# Patient Record
Sex: Female | Born: 1935
Health system: Southern US, Community
[De-identification: ages and names within clinical notes are randomized; demographics above are authoritative.]

## PROBLEM LIST (undated history)

## (undated) DIAGNOSIS — E785 Hyperlipidemia, unspecified: Secondary | ICD-10-CM

## (undated) DIAGNOSIS — F329 Major depressive disorder, single episode, unspecified: Secondary | ICD-10-CM

## (undated) DIAGNOSIS — D649 Anemia, unspecified: Secondary | ICD-10-CM

## (undated) DIAGNOSIS — Z87442 Personal history of urinary calculi: Secondary | ICD-10-CM

## (undated) DIAGNOSIS — M479 Spondylosis, unspecified: Secondary | ICD-10-CM

## (undated) DIAGNOSIS — G4700 Insomnia, unspecified: Secondary | ICD-10-CM

## (undated) DIAGNOSIS — I442 Atrioventricular block, complete: Secondary | ICD-10-CM

## (undated) DIAGNOSIS — R7302 Impaired glucose tolerance (oral): Secondary | ICD-10-CM

## (undated) DIAGNOSIS — C50911 Malignant neoplasm of unspecified site of right female breast: Secondary | ICD-10-CM

## (undated) DIAGNOSIS — K219 Gastro-esophageal reflux disease without esophagitis: Secondary | ICD-10-CM

## (undated) DIAGNOSIS — E739 Lactose intolerance, unspecified: Secondary | ICD-10-CM

## (undated) DIAGNOSIS — Z86718 Personal history of other venous thrombosis and embolism: Secondary | ICD-10-CM

## (undated) DIAGNOSIS — J309 Allergic rhinitis, unspecified: Secondary | ICD-10-CM

## (undated) DIAGNOSIS — I10 Essential (primary) hypertension: Secondary | ICD-10-CM

## (undated) DIAGNOSIS — F411 Generalized anxiety disorder: Secondary | ICD-10-CM

## (undated) DIAGNOSIS — Z950 Presence of cardiac pacemaker: Secondary | ICD-10-CM

## (undated) DIAGNOSIS — Z1379 Encounter for other screening for genetic and chromosomal anomalies: Principal | ICD-10-CM

## (undated) DIAGNOSIS — J209 Acute bronchitis, unspecified: Secondary | ICD-10-CM

## (undated) DIAGNOSIS — E039 Hypothyroidism, unspecified: Secondary | ICD-10-CM

## (undated) DIAGNOSIS — R42 Dizziness and giddiness: Secondary | ICD-10-CM

## (undated) DIAGNOSIS — R32 Unspecified urinary incontinence: Secondary | ICD-10-CM

## (undated) HISTORY — DX: Hypothyroidism, unspecified: E03.9

## (undated) HISTORY — PX: PACEMAKER PLACEMENT: SHX43

## (undated) HISTORY — DX: Gastro-esophageal reflux disease without esophagitis: K21.9

## (undated) HISTORY — DX: Allergic rhinitis, unspecified: J30.9

## (undated) HISTORY — DX: Generalized anxiety disorder: F41.1

## (undated) HISTORY — DX: Essential (primary) hypertension: I10

## (undated) HISTORY — DX: Spondylosis, unspecified: M47.9

## (undated) HISTORY — DX: Acute bronchitis, unspecified: J20.9

## (undated) HISTORY — DX: Major depressive disorder, single episode, unspecified: F32.9

## (undated) HISTORY — DX: Dizziness and giddiness: R42

## (undated) HISTORY — DX: Personal history of urinary calculi: Z87.442

## (undated) HISTORY — DX: Atrioventricular block, complete: I44.2

## (undated) HISTORY — PX: ERCP W/ SPHICTEROTOMY: SHX1523

## (undated) HISTORY — DX: Encounter for other screening for genetic and chromosomal anomalies: Z13.79

## (undated) HISTORY — DX: Insomnia, unspecified: G47.00

## (undated) HISTORY — DX: Anemia, unspecified: D64.9

## (undated) HISTORY — PX: HEMORRHOID SURGERY: SHX153

## (undated) HISTORY — DX: Personal history of other venous thrombosis and embolism: Z86.718

## (undated) HISTORY — DX: Lactose intolerance, unspecified: E73.9

## (undated) HISTORY — PX: POLYPECTOMY: SHX149

## (undated) HISTORY — DX: Impaired glucose tolerance (oral): R73.02

## (undated) HISTORY — DX: Hyperlipidemia, unspecified: E78.5

---

## 1999-06-27 ENCOUNTER — Ambulatory Visit (HOSPITAL_COMMUNITY): Admission: RE | Admit: 1999-06-27 | Discharge: 1999-06-27 | Payer: Self-pay | Admitting: Family Medicine

## 1999-06-27 ENCOUNTER — Encounter: Payer: Self-pay | Admitting: Family Medicine

## 1999-07-28 ENCOUNTER — Other Ambulatory Visit: Admission: RE | Admit: 1999-07-28 | Discharge: 1999-07-28 | Payer: Self-pay | Admitting: Family Medicine

## 2000-01-05 ENCOUNTER — Encounter: Payer: Self-pay | Admitting: Obstetrics and Gynecology

## 2000-01-06 ENCOUNTER — Ambulatory Visit (HOSPITAL_COMMUNITY): Admission: RE | Admit: 2000-01-06 | Discharge: 2000-01-06 | Payer: Self-pay | Admitting: Obstetrics and Gynecology

## 2000-01-06 ENCOUNTER — Encounter (INDEPENDENT_AMBULATORY_CARE_PROVIDER_SITE_OTHER): Payer: Self-pay | Admitting: Specialist

## 2001-05-21 ENCOUNTER — Other Ambulatory Visit: Admission: RE | Admit: 2001-05-21 | Discharge: 2001-05-21 | Payer: Self-pay | Admitting: Urology

## 2001-05-28 ENCOUNTER — Encounter: Payer: Self-pay | Admitting: Urology

## 2001-05-29 ENCOUNTER — Encounter: Payer: Self-pay | Admitting: Urology

## 2001-05-29 ENCOUNTER — Ambulatory Visit (HOSPITAL_COMMUNITY): Admission: RE | Admit: 2001-05-29 | Discharge: 2001-05-29 | Payer: Self-pay | Admitting: Urology

## 2003-04-23 ENCOUNTER — Encounter: Payer: Self-pay | Admitting: Gastroenterology

## 2003-04-23 ENCOUNTER — Observation Stay (HOSPITAL_COMMUNITY): Admission: EM | Admit: 2003-04-23 | Discharge: 2003-04-24 | Payer: Self-pay | Admitting: Gastroenterology

## 2004-10-18 ENCOUNTER — Ambulatory Visit: Payer: Self-pay | Admitting: Internal Medicine

## 2004-10-28 ENCOUNTER — Ambulatory Visit: Payer: Self-pay

## 2004-11-30 ENCOUNTER — Ambulatory Visit: Payer: Self-pay | Admitting: Internal Medicine

## 2004-12-29 ENCOUNTER — Ambulatory Visit: Payer: Self-pay | Admitting: Gastroenterology

## 2005-01-05 ENCOUNTER — Ambulatory Visit: Payer: Self-pay | Admitting: Internal Medicine

## 2005-02-03 ENCOUNTER — Ambulatory Visit: Payer: Self-pay | Admitting: Internal Medicine

## 2005-02-10 ENCOUNTER — Ambulatory Visit: Payer: Self-pay | Admitting: Internal Medicine

## 2005-02-13 ENCOUNTER — Ambulatory Visit (HOSPITAL_COMMUNITY): Admission: RE | Admit: 2005-02-13 | Discharge: 2005-02-13 | Payer: Self-pay | Admitting: Internal Medicine

## 2005-02-13 ENCOUNTER — Ambulatory Visit: Payer: Self-pay | Admitting: Internal Medicine

## 2005-03-01 ENCOUNTER — Ambulatory Visit: Payer: Self-pay

## 2005-04-25 ENCOUNTER — Ambulatory Visit: Payer: Self-pay | Admitting: Internal Medicine

## 2005-05-19 ENCOUNTER — Ambulatory Visit (HOSPITAL_COMMUNITY): Admission: RE | Admit: 2005-05-19 | Discharge: 2005-05-19 | Payer: Self-pay | Admitting: Internal Medicine

## 2005-05-19 ENCOUNTER — Ambulatory Visit: Payer: Self-pay | Admitting: Internal Medicine

## 2005-07-12 ENCOUNTER — Ambulatory Visit: Payer: Self-pay | Admitting: Internal Medicine

## 2005-07-17 ENCOUNTER — Ambulatory Visit: Payer: Self-pay | Admitting: Internal Medicine

## 2005-10-03 ENCOUNTER — Ambulatory Visit: Payer: Self-pay

## 2005-10-10 ENCOUNTER — Ambulatory Visit: Payer: Self-pay | Admitting: Internal Medicine

## 2005-12-12 ENCOUNTER — Ambulatory Visit: Payer: Self-pay | Admitting: Gastroenterology

## 2005-12-12 ENCOUNTER — Ambulatory Visit: Payer: Self-pay | Admitting: Internal Medicine

## 2006-03-08 ENCOUNTER — Other Ambulatory Visit: Admission: RE | Admit: 2006-03-08 | Discharge: 2006-03-08 | Payer: Self-pay | Admitting: Gynecology

## 2006-11-02 ENCOUNTER — Ambulatory Visit: Payer: Self-pay | Admitting: Internal Medicine

## 2006-11-20 LAB — HM MAMMOGRAPHY: HM Mammogram: NORMAL

## 2006-11-20 LAB — CONVERTED CEMR LAB: Pap Smear: NORMAL

## 2007-01-07 ENCOUNTER — Ambulatory Visit: Payer: Self-pay | Admitting: Internal Medicine

## 2007-01-07 LAB — CONVERTED CEMR LAB
ALT: 20 units/L (ref 0–40)
AST: 24 units/L (ref 0–37)
Albumin: 4 g/dL (ref 3.5–5.2)
Alkaline Phosphatase: 72 units/L (ref 39–117)
BUN: 30 mg/dL — ABNORMAL HIGH (ref 6–23)
Basophils Absolute: 0 10*3/uL (ref 0.0–0.1)
Basophils Relative: 0.6 % (ref 0.0–1.0)
Bilirubin Urine: NEGATIVE
Bilirubin, Direct: 0.2 mg/dL (ref 0.0–0.3)
CO2: 32 meq/L (ref 19–32)
Calcium: 10 mg/dL (ref 8.4–10.5)
Chloride: 108 meq/L (ref 96–112)
Cholesterol: 261 mg/dL (ref 0–200)
Creatinine, Ser: 0.8 mg/dL (ref 0.4–1.2)
Direct LDL: 163.4 mg/dL
Eosinophils Absolute: 0.2 10*3/uL (ref 0.0–0.6)
Eosinophils Relative: 3.3 % (ref 0.0–5.0)
GFR calc Af Amer: 91 mL/min
GFR calc non Af Amer: 76 mL/min
Glucose, Bld: 105 mg/dL — ABNORMAL HIGH (ref 70–99)
HCT: 35.2 % — ABNORMAL LOW (ref 36.0–46.0)
HDL: 72.9 mg/dL (ref >39.0)
Hemoglobin, Urine: NEGATIVE
Hemoglobin: 12.3 g/dL (ref 12.0–15.0)
Ketones, ur: NEGATIVE mg/dL
Leukocytes, UA: NEGATIVE
Lymphocytes Relative: 32 % (ref 12.0–46.0)
MCHC: 35.1 g/dL (ref 30.0–36.0)
MCV: 92.6 fL (ref 78.0–100.0)
Monocytes Absolute: 0.7 10*3/uL (ref 0.2–0.7)
Monocytes Relative: 12.1 % — ABNORMAL HIGH (ref 3.0–11.0)
Neutro Abs: 3 10*3/uL (ref 1.4–7.7)
Neutrophils Relative %: 52 % (ref 43.0–77.0)
Nitrite: NEGATIVE
Platelets: 183 10*3/uL (ref 150–400)
Potassium: 4.8 meq/L (ref 3.5–5.1)
RBC: 3.8 M/uL — ABNORMAL LOW (ref 3.87–5.11)
RDW: 13 % (ref 11.5–14.6)
Sodium: 144 meq/L (ref 135–145)
Specific Gravity, Urine: 1.02 (ref 1.000–1.03)
TSH: 0.07 microintl units/mL — ABNORMAL LOW (ref 0.35–5.50)
Total Bilirubin: 1.1 mg/dL (ref 0.3–1.2)
Total CHOL/HDL Ratio: 3.6
Total Protein, Urine: NEGATIVE mg/dL
Total Protein: 6.5 g/dL (ref 6.0–8.3)
Triglycerides: 205 mg/dL (ref 0–149)
Urine Glucose: NEGATIVE mg/dL
Urobilinogen, UA: 0.2 (ref 0.0–1.0)
VLDL: 41 mg/dL — ABNORMAL HIGH (ref 0–40)
WBC: 5.7 10*3/uL (ref 4.5–10.5)
pH: 6.5 (ref 5.0–8.0)

## 2007-12-25 ENCOUNTER — Ambulatory Visit: Payer: Self-pay | Admitting: Gastroenterology

## 2007-12-31 ENCOUNTER — Ambulatory Visit: Payer: Self-pay | Admitting: Internal Medicine

## 2008-01-07 ENCOUNTER — Ambulatory Visit: Payer: Self-pay | Admitting: Internal Medicine

## 2008-01-07 LAB — CONVERTED CEMR LAB
ALT: 21 units/L (ref 0–35)
AST: 21 units/L (ref 0–37)
Albumin: 4.4 g/dL (ref 3.5–5.2)
Alkaline Phosphatase: 72 units/L (ref 39–117)
BUN: 16 mg/dL (ref 6–23)
Basophils Absolute: 0 10*3/uL (ref 0.0–0.1)
Basophils Relative: 0.6 % (ref 0.0–1.0)
Bilirubin Urine: NEGATIVE
Bilirubin, Direct: 0.2 mg/dL (ref 0.0–0.3)
CO2: 28 meq/L (ref 19–32)
Calcium: 9.9 mg/dL (ref 8.4–10.5)
Chloride: 102 meq/L (ref 96–112)
Cholesterol: 279 mg/dL (ref 0–200)
Creatinine, Ser: 0.8 mg/dL (ref 0.4–1.2)
Crystals: NEGATIVE
Direct LDL: 162.4 mg/dL
Eosinophils Absolute: 0.2 10*3/uL (ref 0.0–0.6)
Eosinophils Relative: 2.4 % (ref 0.0–5.0)
GFR calc Af Amer: 91 mL/min
GFR calc non Af Amer: 75 mL/min
Glucose, Bld: 91 mg/dL (ref 70–99)
HCT: 37.7 % (ref 36.0–46.0)
HDL: 99.4 mg/dL (ref >39.0)
Hemoglobin: 12.6 g/dL (ref 12.0–15.0)
Ketones, ur: NEGATIVE mg/dL
Leukocytes, UA: NEGATIVE
Lymphocytes Relative: 23 % (ref 12.0–46.0)
MCHC: 33.5 g/dL (ref 30.0–36.0)
MCV: 92.9 fL (ref 78.0–100.0)
Monocytes Absolute: 0.7 10*3/uL (ref 0.2–0.7)
Monocytes Relative: 9.8 % (ref 3.0–11.0)
Mucus, UA: NEGATIVE
Neutro Abs: 4.6 10*3/uL (ref 1.4–7.7)
Neutrophils Relative %: 64.2 % (ref 43.0–77.0)
Nitrite: NEGATIVE
Platelets: 210 10*3/uL (ref 150–400)
Potassium: 4.4 meq/L (ref 3.5–5.1)
RBC: 4.05 M/uL (ref 3.87–5.11)
RDW: 12.9 % (ref 11.5–14.6)
Sodium: 138 meq/L (ref 135–145)
Specific Gravity, Urine: 1.02 (ref 1.000–1.03)
TSH: 1.58 microintl units/mL (ref 0.35–5.50)
Total Bilirubin: 1.5 mg/dL — ABNORMAL HIGH (ref 0.3–1.2)
Total CHOL/HDL Ratio: 2.8
Total Protein, Urine: NEGATIVE mg/dL
Total Protein: 7.1 g/dL (ref 6.0–8.3)
Triglycerides: 76 mg/dL (ref 0–149)
Urine Glucose: NEGATIVE mg/dL
Urobilinogen, UA: 0.2 (ref 0.0–1.0)
VLDL: 15 mg/dL (ref 0–40)
WBC, UA: NONE SEEN cells/hpf
WBC: 7.2 10*3/uL (ref 4.5–10.5)
pH: 6 (ref 5.0–8.0)

## 2008-01-09 ENCOUNTER — Ambulatory Visit: Payer: Self-pay | Admitting: Internal Medicine

## 2008-01-09 DIAGNOSIS — D649 Anemia, unspecified: Secondary | ICD-10-CM | POA: Insufficient documentation

## 2008-01-09 DIAGNOSIS — F411 Generalized anxiety disorder: Secondary | ICD-10-CM | POA: Insufficient documentation

## 2008-01-09 DIAGNOSIS — I10 Essential (primary) hypertension: Secondary | ICD-10-CM | POA: Insufficient documentation

## 2008-01-09 DIAGNOSIS — Z86718 Personal history of other venous thrombosis and embolism: Secondary | ICD-10-CM

## 2008-01-09 DIAGNOSIS — F329 Major depressive disorder, single episode, unspecified: Secondary | ICD-10-CM | POA: Insufficient documentation

## 2008-01-09 DIAGNOSIS — J309 Allergic rhinitis, unspecified: Secondary | ICD-10-CM

## 2008-01-09 DIAGNOSIS — E785 Hyperlipidemia, unspecified: Secondary | ICD-10-CM | POA: Insufficient documentation

## 2008-01-09 DIAGNOSIS — E039 Hypothyroidism, unspecified: Secondary | ICD-10-CM | POA: Insufficient documentation

## 2008-01-09 DIAGNOSIS — Z87442 Personal history of urinary calculi: Secondary | ICD-10-CM

## 2008-01-09 DIAGNOSIS — K219 Gastro-esophageal reflux disease without esophagitis: Secondary | ICD-10-CM

## 2008-01-09 DIAGNOSIS — E739 Lactose intolerance, unspecified: Secondary | ICD-10-CM

## 2008-01-09 DIAGNOSIS — F3289 Other specified depressive episodes: Secondary | ICD-10-CM

## 2008-01-09 HISTORY — DX: Essential (primary) hypertension: I10

## 2008-01-09 HISTORY — DX: Allergic rhinitis, unspecified: J30.9

## 2008-01-09 HISTORY — DX: Major depressive disorder, single episode, unspecified: F32.9

## 2008-01-09 HISTORY — DX: Gastro-esophageal reflux disease without esophagitis: K21.9

## 2008-01-09 HISTORY — DX: Hyperlipidemia, unspecified: E78.5

## 2008-01-09 HISTORY — DX: Hypothyroidism, unspecified: E03.9

## 2008-01-09 HISTORY — DX: Personal history of other venous thrombosis and embolism: Z86.718

## 2008-01-09 HISTORY — DX: Anemia, unspecified: D64.9

## 2008-01-09 HISTORY — DX: Generalized anxiety disorder: F41.1

## 2008-01-09 HISTORY — DX: Personal history of urinary calculi: Z87.442

## 2008-01-09 HISTORY — DX: Lactose intolerance, unspecified: E73.9

## 2008-01-09 HISTORY — DX: Other specified depressive episodes: F32.89

## 2008-02-19 ENCOUNTER — Encounter: Payer: Self-pay | Admitting: Internal Medicine

## 2008-02-25 ENCOUNTER — Encounter: Payer: Self-pay | Admitting: Internal Medicine

## 2008-04-06 ENCOUNTER — Ambulatory Visit: Payer: Self-pay | Admitting: Internal Medicine

## 2008-07-06 ENCOUNTER — Ambulatory Visit: Payer: Self-pay | Admitting: Internal Medicine

## 2008-07-06 LAB — CONVERTED CEMR LAB
ALT: 21 units/L (ref 0–35)
Cholesterol: 188 mg/dL (ref 0–200)
HDL: 78.7 mg/dL (ref >39.0)
Total Bilirubin: 1.4 mg/dL — ABNORMAL HIGH (ref 0.3–1.2)
Total Protein: 7.2 g/dL (ref 6.0–8.3)
VLDL: 23 mg/dL (ref 0–40)

## 2008-07-08 ENCOUNTER — Ambulatory Visit: Payer: Self-pay | Admitting: Internal Medicine

## 2008-07-22 ENCOUNTER — Encounter: Payer: Self-pay | Admitting: Internal Medicine

## 2008-08-05 ENCOUNTER — Ambulatory Visit: Payer: Self-pay | Admitting: Internal Medicine

## 2008-09-02 ENCOUNTER — Ambulatory Visit: Payer: Self-pay | Admitting: Internal Medicine

## 2008-10-20 ENCOUNTER — Ambulatory Visit: Payer: Self-pay | Admitting: Internal Medicine

## 2008-10-20 DIAGNOSIS — J209 Acute bronchitis, unspecified: Secondary | ICD-10-CM

## 2008-10-20 HISTORY — DX: Acute bronchitis, unspecified: J20.9

## 2008-12-31 ENCOUNTER — Ambulatory Visit: Payer: Self-pay | Admitting: Internal Medicine

## 2009-01-15 ENCOUNTER — Encounter: Payer: Self-pay | Admitting: Internal Medicine

## 2009-01-18 ENCOUNTER — Encounter: Payer: Self-pay | Admitting: Internal Medicine

## 2009-01-26 ENCOUNTER — Ambulatory Visit: Payer: Self-pay | Admitting: Internal Medicine

## 2009-03-03 ENCOUNTER — Encounter: Payer: Self-pay | Admitting: Internal Medicine

## 2009-03-29 ENCOUNTER — Encounter: Payer: Self-pay | Admitting: Internal Medicine

## 2009-05-04 ENCOUNTER — Telehealth (INDEPENDENT_AMBULATORY_CARE_PROVIDER_SITE_OTHER): Payer: Self-pay | Admitting: *Deleted

## 2009-05-07 ENCOUNTER — Encounter: Payer: Self-pay | Admitting: Internal Medicine

## 2009-05-10 ENCOUNTER — Encounter: Payer: Self-pay | Admitting: Internal Medicine

## 2009-05-12 ENCOUNTER — Encounter: Payer: Self-pay | Admitting: Internal Medicine

## 2009-05-14 ENCOUNTER — Telehealth (INDEPENDENT_AMBULATORY_CARE_PROVIDER_SITE_OTHER): Payer: Self-pay | Admitting: *Deleted

## 2009-05-19 ENCOUNTER — Ambulatory Visit: Payer: Self-pay | Admitting: Internal Medicine

## 2009-06-03 ENCOUNTER — Encounter: Payer: Self-pay | Admitting: Internal Medicine

## 2009-06-15 ENCOUNTER — Telehealth (INDEPENDENT_AMBULATORY_CARE_PROVIDER_SITE_OTHER): Payer: Self-pay | Admitting: *Deleted

## 2009-06-15 ENCOUNTER — Ambulatory Visit: Payer: Self-pay | Admitting: Internal Medicine

## 2009-06-15 DIAGNOSIS — G4700 Insomnia, unspecified: Secondary | ICD-10-CM

## 2009-06-15 HISTORY — DX: Insomnia, unspecified: G47.00

## 2009-06-17 ENCOUNTER — Encounter: Payer: Self-pay | Admitting: Internal Medicine

## 2009-06-18 ENCOUNTER — Ambulatory Visit: Payer: Self-pay | Admitting: Internal Medicine

## 2009-08-19 ENCOUNTER — Encounter: Payer: Self-pay | Admitting: Internal Medicine

## 2009-08-31 ENCOUNTER — Encounter: Payer: Self-pay | Admitting: Internal Medicine

## 2009-09-01 ENCOUNTER — Ambulatory Visit: Payer: Self-pay | Admitting: Internal Medicine

## 2009-09-09 ENCOUNTER — Encounter: Payer: Self-pay | Admitting: Internal Medicine

## 2009-11-03 ENCOUNTER — Encounter (INDEPENDENT_AMBULATORY_CARE_PROVIDER_SITE_OTHER): Payer: Self-pay | Admitting: *Deleted

## 2009-12-16 ENCOUNTER — Ambulatory Visit: Payer: Self-pay | Admitting: Internal Medicine

## 2009-12-16 DIAGNOSIS — I442 Atrioventricular block, complete: Secondary | ICD-10-CM | POA: Insufficient documentation

## 2009-12-16 HISTORY — DX: Atrioventricular block, complete: I44.2

## 2010-02-25 ENCOUNTER — Ambulatory Visit: Payer: Self-pay | Admitting: Internal Medicine

## 2010-02-25 DIAGNOSIS — M479 Spondylosis, unspecified: Secondary | ICD-10-CM | POA: Insufficient documentation

## 2010-02-25 DIAGNOSIS — R42 Dizziness and giddiness: Secondary | ICD-10-CM

## 2010-02-25 DIAGNOSIS — M47812 Spondylosis without myelopathy or radiculopathy, cervical region: Secondary | ICD-10-CM | POA: Insufficient documentation

## 2010-02-25 HISTORY — DX: Dizziness and giddiness: R42

## 2010-02-25 HISTORY — DX: Spondylosis, unspecified: M47.9

## 2010-02-25 HISTORY — DX: Spondylosis without myelopathy or radiculopathy, cervical region: M47.812

## 2010-03-15 ENCOUNTER — Encounter: Payer: Self-pay | Admitting: Internal Medicine

## 2010-03-16 ENCOUNTER — Ambulatory Visit: Payer: Self-pay

## 2010-03-16 ENCOUNTER — Encounter: Payer: Self-pay | Admitting: Internal Medicine

## 2010-03-23 ENCOUNTER — Encounter: Payer: Self-pay | Admitting: Internal Medicine

## 2010-04-07 ENCOUNTER — Encounter: Payer: Self-pay | Admitting: Internal Medicine

## 2010-04-08 ENCOUNTER — Encounter: Payer: Self-pay | Admitting: Internal Medicine

## 2010-04-14 ENCOUNTER — Encounter: Payer: Self-pay | Admitting: Internal Medicine

## 2010-06-01 ENCOUNTER — Encounter: Payer: Self-pay | Admitting: Internal Medicine

## 2010-06-07 ENCOUNTER — Encounter: Payer: Self-pay | Admitting: Internal Medicine

## 2010-06-10 ENCOUNTER — Ambulatory Visit: Payer: Self-pay | Admitting: Internal Medicine

## 2010-06-17 ENCOUNTER — Encounter (INDEPENDENT_AMBULATORY_CARE_PROVIDER_SITE_OTHER): Payer: Self-pay | Admitting: *Deleted

## 2010-06-22 ENCOUNTER — Ambulatory Visit: Payer: Self-pay | Admitting: Internal Medicine

## 2010-07-06 ENCOUNTER — Telehealth: Payer: Self-pay | Admitting: Gastroenterology

## 2010-07-08 ENCOUNTER — Encounter: Payer: Self-pay | Admitting: Internal Medicine

## 2010-07-22 ENCOUNTER — Encounter: Payer: Self-pay | Admitting: Internal Medicine

## 2010-07-26 ENCOUNTER — Encounter: Payer: Self-pay | Admitting: Internal Medicine

## 2010-07-27 ENCOUNTER — Encounter: Admission: RE | Admit: 2010-07-27 | Discharge: 2010-07-27 | Payer: Self-pay | Admitting: Neurology

## 2010-08-17 ENCOUNTER — Encounter: Admission: RE | Admit: 2010-08-17 | Discharge: 2010-09-19 | Payer: Self-pay | Admitting: Neurology

## 2010-09-21 ENCOUNTER — Ambulatory Visit: Payer: Self-pay | Admitting: Internal Medicine

## 2010-10-20 ENCOUNTER — Encounter (INDEPENDENT_AMBULATORY_CARE_PROVIDER_SITE_OTHER): Payer: Self-pay | Admitting: *Deleted

## 2010-12-04 ENCOUNTER — Encounter: Payer: Self-pay | Admitting: Internal Medicine

## 2010-12-22 NOTE — Letter (Signed)
Summary: Remote Device Check  [employer], Main Office  [address]   [place], [place] [address]   Phone: [phone]  Fax: [phone]     July 08, 2010 MRN: [id number]   TIONNA GIGANTE [address] [place], [place]  [address]   Dear Ms. Cornwall,   Your remote transmission was recieved and reviewed by your physician.  All diagnostics were within normal limits for you.  __X___Your next transmission is scheduled for:  09-29-2010.  Please transmit at any time this day.  If you have a wireless device your transmission will be sent automatically.    Sincerely,  Vella Kohler

## 2010-12-22 NOTE — Letter (Signed)
Summary: [facility]  [facility]   Imported By: Lester [place] 06/07/2010 07:37:30  _____________________________________________________________________  External Attachment:    Type:   Image     Comment:   External Document

## 2010-12-22 NOTE — Consult Note (Signed)
Summary: [facility]  [facility]   Imported By: Lester [place] 07/29/2010 08:19:45  _____________________________________________________________________  External Attachment:    Type:   Image     Comment:   External Document

## 2010-12-22 NOTE — Assessment & Plan Note (Signed)
Summary: dizzy spells and headsaches-lb   Vital Signs:  Patient profile:   75 year old female Height:      64 inches Weight:      150.50 pounds BMI:     25.93 O2 Sat:      97 % on Room air Temp:     97 degrees F oral Pulse rate:   68 / minute BP sitting:   128 / 72  (left arm) Cuff size:   regular  Vitals Entered ByZella Ball Ewing (February 25, 2010 2:27 PM)  O2 Flow:  Room air  Preventive Care Screening     declines colonscopy at this time  CC: dizzy, headaches for 3 days/RE   CC:  dizzy and headaches for 3 days/RE.  History of Present Illness: here with ? unsual recurrent dizziness x 3 datys, mild , that seems  assoc with neck movement to left and right horizontal as well as full extension of the head ;  ? also assoc with post neck pain which radiates to the post head (the pain aspect is NOT new, only the dizziness;  but has hx of vertigo  before and ? different;  has sinus allergic type congestion but allegra and nasal spray seems to help (per allergy - Dr Stevphen Rochester);  did stop the allegra 2 days ago  but dizziness remains no change.  In retrospect it does seem the type of dizziness that seemed better before with meclizine.   Also with chronic recurrent insominia , but insurance will not pay for lunesta.  denies worsening depressive symptoms, suicidal ideation, panic.    Here for wellness Diet: Heart Healthy or DM if diabetic Physical Activities: Sedentary Depression/mood screen: Negative Hearing: mild decreased bilateral Visual Acuity: Grossly normal ADL's: Capable  Fall Risk: None Home Safety: Good End-of-Life Planning: Advance directive - Full code/I agree   Problems Prior to Update: 1)  Dizziness  (ICD-780.4) 2)  Osteoarthritis, Cervical Spine  (ICD-721.90) 3)  Av Block, Complete  (ICD-426.0) 4)  Cardiac Pacemaker in Situ-medtronic Enpulse E2dr01  (ICD-V45.01) 5)  Dvt, Hx of  (ICD-V12.51) 6)  Preventive Health Care  (ICD-V70.0) 7)  Allergic Rhinitis   (ICD-477.9) 8)  Anemia-nos  (ICD-285.9) 9)  Glucose Intolerance  (ICD-271.3) 10)  Depression  (ICD-311) 11)  Anxiety  (ICD-300.00) 12)  Gerd  (ICD-530.81) 13)  Hyperlipidemia  (ICD-272.4) 14)  Hypothyroidism  (ICD-244.9) 15)  Hypertension  (ICD-401.9) 16)  Routine General Medical Exam@health  Care Facl  (ICD-V70.0) 17)  Insomnia-sleep Disorder-unspec  (ICD-780.52) 18)  Asthmatic Bronchitis, Acute  (ICD-466.0) 19)  Nephrolithiasis, Hx of  (ICD-V13.01)  Medications Prior to Update: 1)  Atenolol 50 Mg Tabs (Atenolol) .Marland Kitchen.. 1 By Mouth Once Daily 2)  Enalapril Maleate 10 Mg Tabs (Enalapril Maleate) .... Take 1 Tablet By Mouth Once A Day 3)  Levoxyl 112 Mcg Tabs (Levothyroxine Sodium) .Marland Kitchen.. 1 Tablet By Mouth Once A Day 4)  Simvastatin 80 Mg Tabs (Simvastatin) .Marland Kitchen.. 1po Once Daily 5)  Allegra 6)  Flovent Hfa 110 Mcg/act  Aero (Fluticasone Propionate  Hfa) .... Two Times A Day 7)  Astelin 137 Mcg/spray  Soln (Azelastine Hcl) .... Two Times A Day 8)  Omeprazole 20 Mg  Tbec (Omeprazole) .Marland Kitchen.. 1 By Mouth Once Daily 9)  Lunesta 2 Mg  Tabs (Eszopiclone) .Marland Kitchen.. 1po At Bedtime As Needed Sleep 10)  Adult Aspirin Ec Low Strength 81 Mg  Tbec (Aspirin) .Marland Kitchen.. 1po Once Daily  Current Medications (verified): 1)  Atenolol 50 Mg Tabs (Atenolol) .Marland KitchenMarland KitchenMarland Kitchen  1 By Mouth Once Daily 2)  Enalapril Maleate 10 Mg Tabs (Enalapril Maleate) .... Take 1 Tablet By Mouth Once A Day 3)  Levoxyl 112 Mcg Tabs (Levothyroxine Sodium) .Marland Kitchen.. 1 Tablet By Mouth Once A Day 4)  Simvastatin 80 Mg Tabs (Simvastatin) .Marland Kitchen.. 1po Once Daily 5)  Allegra 6)  Flovent Hfa 110 Mcg/act  Aero (Fluticasone Propionate  Hfa) .... Two Times A Day 7)  Astelin 137 Mcg/spray  Soln (Azelastine Hcl) .... Two Times A Day 8)  Omeprazole 20 Mg  Tbec (Omeprazole) .Marland Kitchen.. 1 By Mouth Once Daily 9)  Ambien Cr 12.5 Mg Cr-Tabs (Zolpidem Tartrate) .Marland Kitchen.. 1 Po At Bedtime As Needed 10)  Adult Aspirin Ec Low Strength 81 Mg  Tbec (Aspirin) .Marland Kitchen.. 1po Once Daily 11)  Meclizine Hcl 12.5  Mg Tabs (Meclizine Hcl) .Marland Kitchen.. 1 - 2 By Mouth Q 6 Hrs As Needed Dizziness 12)  Prednisone 10 Mg Tabs (Prednisone) .... 3po Qd For 3days, Then 2po Qd For 3days, Then 1po Qd For 3days, Then Stop  Allergies (verified): No Known Drug Allergies  Past History:  Past Surgical History: Last updated: 12/16/2009 s/p pacemaker-Medtronic EnPulse E2DR01--06 s/p ERCP with sphincterotomy Hemorrhoidectomy s/p uterine polyp  Family History: Last updated: 01/09/2008 father with prob PE mother with breast cancer and sudden death - ? cardiac  Social History: Last updated: 01/09/2008 Never Smoked Alcohol use-yes Married 4 children  work - Diplomatic Services operational officer  Risk Factors: Smoking Status: never (01/09/2008)  Past Medical History: Hypertension Hypothyroidism Hyperlipidemia DVT, hx of hx of complete heart block Medtronic EnPulse E2DR01 implanted for the above GERD Anxiety Depression glucose intolerance Nephrolithiasis, hx of Anemia-NOS Allergic rhinitis cervical spine DJD  Review of Systems  The patient denies anorexia, fever, weight gain, vision loss, decreased hearing, hoarseness, chest pain, syncope, dyspnea on exertion, peripheral edema, prolonged cough, headaches, hemoptysis, abdominal pain, melena, hematochezia, severe indigestion/heartburn, hematuria, suspicious skin lesions, difficulty walking, unusual weight change, abnormal bleeding, enlarged lymph nodes, and angioedema.         all otherwise negative per pt -    Physical Exam  General:  alert and well-developed.   Head:  normocephalic and atraumatic.   Eyes:  vision grossly intact, pupils equal, and pupils round.   Ears:  bilat tm's red, sinus nontender Nose:  nasal dischargemucosal pallor and mucosal edema.   Mouth:  pharyngeal erythema and fair dentition.   Neck:  supple and cervical lymphadenopathy.   Lungs:  normal respiratory effort and normal breath sounds.   Heart:  normal rate and regular rhythm.   Abdomen:  soft,  non-tender, and normal bowel sounds.   Msk:  no joint tenderness and no joint swelling.   Extremities:  no edema, no erythema  Neurologic:  cranial nerves II-XII intact, strength normal in all extremities, and DTRs symmetrical and normal.   Skin:  color normal and no rashes.   Psych:  slightly anxious.     Impression & Recommendations:  Problem # 1:  DIZZINESS (ICD-780.4)  c/w vertigo, but also ? lightheaded;  for antivert, check carotid dopplers, hold on MRI due to pacemaker, CT no likely to be helpful  Her updated medication list for this problem includes:    Meclizine Hcl 12.5 Mg Tabs (Meclizine hcl) .Marland Kitchen... 1 - 2 by mouth q 6 hrs as needed dizziness  Orders: Misc. Referral (Misc. Ref)  Problem # 2:  ALLERGIC RHINITIS (ICD-477.9)  Her updated medication list for this problem includes:    Astelin 137 Mcg/spray Soln (Azelastine hcl) .Marland Kitchen..Marland Kitchen Two  times a day also for depo shot today, and prednisone asd  Orders: Depo- Medrol 40mg  (J1030) Depo- Medrol 80mg  (J1040) Admin of Therapeutic Inj  intramuscular or subcutaneous (04540)  Problem # 3:  INSOMNIA-SLEEP DISORDER-UNSPEC (ICD-780.52)  Her updated medication list for this problem includes:    Ambien Cr 12.5 Mg Cr-tabs (Zolpidem tartrate) .Marland Kitchen... 1 po at bedtime as needed treat as above, f/u any worsening signs or symptoms   Orders: Prescription Created Electronically [id number])  Problem # 4:  PREVENTIVE HEALTH CARE (ICD-V70.0)  Overall doing well, age appropriate education and counseling updated and referral for appropriate preventive services done unless declined, immunizations up to date or declined, diet counseling done if overweight, urged to quit smoking if smokes , most recent labs reviewed and current ordered if appropriate, ecg reviewed or declined (interpretation per ECG scanned in the EMR if done); information regarding Medicare Prevention requirements given if appropriate   Orders: First annual wellness visit with  prevention plan  (J4782)  Complete Medication List: 1)  Atenolol 50 Mg Tabs (Atenolol) .Marland Kitchen.. 1 by mouth once daily 2)  Enalapril Maleate 10 Mg Tabs (Enalapril maleate) .... Take 1 tablet by mouth once a day 3)  Levoxyl 112 Mcg Tabs (Levothyroxine sodium) .Marland Kitchen.. 1 tablet by mouth once a day 4)  Simvastatin 80 Mg Tabs (Simvastatin) .Marland Kitchen.. 1po once daily 5)  Allegra  6)  Flovent Hfa 110 Mcg/act Aero (Fluticasone propionate  hfa) .... Two times a day 7)  Astelin 137 Mcg/spray Soln (Azelastine hcl) .... Two times a day 8)  Omeprazole 20 Mg Tbec (Omeprazole) .Marland Kitchen.. 1 by mouth once daily 9)  Ambien Cr 12.5 Mg Cr-tabs (Zolpidem tartrate) .Marland Kitchen.. 1 po at bedtime as needed 10)  Adult Aspirin Ec Low Strength 81 Mg Tbec (Aspirin) .Marland Kitchen.. 1po once daily 11)  Meclizine Hcl 12.5 Mg Tabs (Meclizine hcl) .Marland Kitchen.. 1 - 2 by mouth q 6 hrs as needed dizziness 12)  Prednisone 10 Mg Tabs (Prednisone) .... 3po qd for 3days, then 2po qd for 3days, then 1po qd for 3days, then stop  Patient Instructions: 1)  you had the steroid shot today 2)  Please take all new medications as prescribed - the prednisone, and the antivert generic 3)  Continue all previous medications as before this visit 4)  You will be contacted about the referral(s) to: carotid dopplers 5)  Please schedule a follow-up appointment in 3 months "yearly exam" Prescriptions: AMBIEN CR 12.5 MG CR-TABS (ZOLPIDEM TARTRATE) 1 po at bedtime as needed  #30 x 5   Entered and Authorized by:   Corwin Levins MD   Signed by:   Corwin Levins MD on 02/25/2010   Method used:   Print then Give to Patient   RxID:   [id number] PREDNISONE 10 MG TABS (PREDNISONE) 3po qd for 3days, then 2po qd for 3days, then 1po qd for 3days, then stop  #18 x 0   Entered and Authorized by:   Corwin Levins MD   Signed by:   Corwin Levins MD on 02/25/2010   Method used:   Print then Give to Patient   RxID:   [id number] MECLIZINE HCL 12.5 MG TABS (MECLIZINE HCL) 1 - 2 by mouth q 6 hrs as  needed dizziness  #50 x 1   Entered and Authorized by:   Corwin Levins MD   Signed by:   Corwin Levins MD on 02/25/2010   Method used:   Print then Give to Patient   RxID:  [id number]    Medication Administration  Injection # 1:    Medication: Depo- Medrol 40mg     Diagnosis: ALLERGIC RHINITIS (ICD-477.9)    Route: IM    Site: RUOQ gluteus    Exp Date: 09/2012    Lot #: [id number]    Mfr: Pharm[employer]    Given by: Zella Ball Ewing (February 25, 2010 3:13 PM)  Injection # 2:    Medication: Depo- Medrol 80mg     Diagnosis: ALLERGIC RHINITIS (ICD-477.9)    Route: IM    Site: RUOQ gluteus    Exp Date: 09/2012    Lot #: [id number]    Mfr: [employer]    Given by: Zella Ball Ewing (February 25, 2010 3:13 PM)  Orders Added: 1)  Depo- Medrol 40mg  [J1030] 2)  Depo- Medrol 80mg  [J1040] 3)  Admin of Therapeutic Inj  intramuscular or subcutaneous [96372] 4)  Misc. Referral [Misc. Ref] 5)  First annual wellness visit with prevention plan  [G0438] 6)  Prescription Created Electronically [G8553] 7)  Est. Patient Level IV [28413]

## 2010-12-22 NOTE — Cardiovascular Report (Signed)
Summary: Office Visit   Office Visit   Imported By: Roderic Ovens 12/21/2009 16:33:22  _____________________________________________________________________  External Attachment:    Type:   Image     Comment:   External Document

## 2010-12-22 NOTE — Letter (Signed)
Summary: [employer], [employer], Main Office  [address]   [place], [place] [address]   Phone: [phone]  Fax: [phone]     June 07, 2010 MRN: [id number]   DEZIREA MCCOLLISTER [address] [place], [place]  [address]   Dear Ms. Schroepfer,  According to our records, you were scheduled for a device phone transmission on  03-16-10.     We did not receive any results from this check.  If you transmitted on your scheduled day, please call us to help troubleshoot your system.  If you forgot to send your transmission, please send one upon receipt of this letter.  Thank you,   [facility]

## 2010-12-22 NOTE — Cardiovascular Report (Signed)
Summary: Office Visit Remote   Office Visit Remote   Imported By: Roderic Ovens 07/13/2010 15:01:42  _____________________________________________________________________  External Attachment:    Type:   Image     Comment:   External Document

## 2010-12-22 NOTE — Letter (Signed)
Summary: [facility]  [facility]   Imported By: Lester [place] 04/19/2010 09:43:49  _____________________________________________________________________  External Attachment:    Type:   Image     Comment:   External Document

## 2010-12-22 NOTE — Letter (Signed)
Summary: [facility]   Imported By: Sherian Rein 04/20/2010 40:98:11  _____________________________________________________________________  External Attachment:    Type:   Image     Comment:   External Document

## 2010-12-22 NOTE — Assessment & Plan Note (Signed)
Summary: rov/kfw      Allergies Added: NKDA  CC:  1 year return.  Pt has no cardiac concerns at this time.  Marland Kitchen  History of Present Illness:    Danielle [name] is seen in followup for complete heart block for which he is status post pacemaker. She is approaching ERI and is being followed transtelophonic monitoring.   The patient denies SOB, chest pain, edema or palpitations   Current Medications (verified): 1)  Atenolol 50 Mg Tabs (Atenolol) .Marland Kitchen.. 1 By Mouth Once Daily 2)  Enalapril Maleate 10 Mg Tabs (Enalapril Maleate) .... Take 1 Tablet By Mouth Once A Day 3)  Levoxyl 112 Mcg Tabs (Levothyroxine Sodium) .Marland Kitchen.. 1 Tablet By Mouth Once A Day 4)  Simvastatin 80 Mg Tabs (Simvastatin) .Marland Kitchen.. 1po Once Daily 5)  Allegra 6)  Flovent Hfa 110 Mcg/act  Aero (Fluticasone Propionate  Hfa) .... Two Times A Day 7)  Astelin 137 Mcg/spray  Soln (Azelastine Hcl) .... Two Times A Day 8)  Omeprazole 20 Mg  Tbec (Omeprazole) .Marland Kitchen.. 1 By Mouth Once Daily 9)  Lunesta 2 Mg  Tabs (Eszopiclone) .Marland Kitchen.. 1po At Bedtime As Needed Sleep 10)  Adult Aspirin Ec Low Strength 81 Mg  Tbec (Aspirin) .Marland Kitchen.. 1po Once Daily  Allergies (verified): No Known Drug Allergies  Past History:  Past Medical History: Last updated: 12/16/2009 Hypertension Hypothyroidism Hyperlipidemia DVT, hx of hx of complete heart block Medtronic EnPulse E2DR01 implanted for the above GERD Anxiety Depression glucose intolerance Nephrolithiasis, hx of Anemia-NOS Allergic rhinitis  Past Surgical History: Last updated: 12/16/2009 s/p pacemaker-Medtronic EnPulse E2DR01--06 s/p ERCP with sphincterotomy Hemorrhoidectomy s/p uterine polyp  Family History: Last updated: 01/09/2008 father with prob PE mother with breast cancer and sudden death - ? cardiac  Social History: Last updated: 01/09/2008 Never Smoked Alcohol use-yes Married 4 children  work - Diplomatic Services operational officer  Vital Signs:  Patient profile:   75 year old female Height:      64  inches Weight:      151 pounds BMI:     26.01 Pulse rate:   70 / minute Pulse rhythm:   regular BP sitting:   146 / 84  (left arm) Cuff size:   regular  Vitals Entered By: Judithe Modest CMA (December 16, 2009 2:17 PM)  Physical Exam  General:  The patient was alert and oriented in no acute distress. HEENT Normal.  Neck veins were flat, carotids were brisk.  Lungs were clear.  Heart sounds were regular without murmurs or gallops.  Abdomen was soft with active bowel sounds. There is no clubbing cyanosis or edema. Skin Warm and dry    EKG  Procedure date:  12/16/2009  Findings:      P synchronous pacing  PPM Specifications Following MD:  Sherryl Manges, MD     PPM Vendor:  Medtronic     PPM Model Number:  [id number]     PPM Serial Number:  [id number] PPM DOI:  02/13/2005     PPM Implanting MD:  Sherryl Manges, MD  Lead 1    Location: RA     DOI: 12/07/1995     Model #: [id number]     Serial #: [id number]     Status: active Lead 2    Location: RV     DOI: 12/07/1995     Model #: [id number]     Serial #: [id number]     Status: active  Magnet Response Rate:  BOL 85 ERI  65  Indications:  CHB  Explantation Comments:  Carelink Pacemaker dependent  PPM Follow Up Remote Check?  No Battery Voltage:  2.74 V     Battery Est. Longevity:  2.5 years     Pacer Dependent:  Yes       PPM Device Measurements Atrium  Amplitude: 1.4 mV, Impedance: 697 ohms, Threshold: 1.75 V at 0.4 msec Right Ventricle  Impedance: 865 ohms, Threshold: 1.5 V at 0.4 msec  Episodes MS Episodes:  1     Percent Mode Switch:  <0.1%     Coumadin:  No Ventricular High Rate:  0     Atrial Pacing:  61%     Ventricular Pacing:  100%  Parameters Mode:  DDDR     Lower Rate Limit:  60     Upper Rate Limit:  130 Paced AV Delay:  150     Sensed AV Delay:  120 Next Remote Date:  03/16/2010     Next Cardiology Appt Due:  11/20/2010 Tech Comments:  No parameter changes.  Atrial threshold elevated.  Autocapture on.   Carelink  transmissions every 3 months.  ROV 1 year with Dr. Graciela Husbands. [name], LPN  December 16, 2009 2:50 PM   Impression & Recommendations:  Problem # 1:  CARDIAC PACEMAKER IN SITU-MEDTRONIC ENPULSE [phone] (ICD-V45.01) Device parameters and data were reviewed and no changes were made   Orders: EKG w/ Interpretation (93000)  Problem # 2:  AV BLOCK, COMPLETE (ICD-426.0) stable Her updated medication list for this problem includes:    Atenolol 50 Mg Tabs (Atenolol) .Marland Kitchen... 1 by mouth once daily    Enalapril Maleate 10 Mg Tabs (Enalapril maleate) .Marland Kitchen... Take 1 tablet by mouth once a day    Adult Aspirin Ec Low Strength 81 Mg Tbec (Aspirin) .Marland Kitchen... 1po once daily

## 2010-12-22 NOTE — Letter (Signed)
Summary: [facility]  [facility]   Imported By: Lester  05/19/2010 09:39:47  _____________________________________________________________________  External Attachment:    Type:   Image     Comment:   External Document

## 2010-12-22 NOTE — Assessment & Plan Note (Signed)
Summary: VERTIGO /NWS  #   Vital Signs:  Patient profile:   75 year old female Height:      64 inches Weight:      145.25 pounds BMI:     25.02 O2 Sat:      98 % on Room air Temp:     97.3 degrees F oral Pulse rate:   78 / minute BP sitting:   118 / 62  (left arm) Cuff size:   regular  Vitals Entered By: Zella Ball Ewing CMA (AAMA) (June 10, 2010 1:48 PM)  O2 Flow:  Room air CC: Vertigo/RE   CC:  Vertigo/RE.  History of Present Illness: here to f/u;  still with positional veritgo , only occurs with position changes, but now in the past 2 dyas with acute onset mod to severe right earache, and mild left earache; ? fever at home;  no ST or coug, and Pt denies CP, sob, doe, wheezing, orthopnea, pnd, worsening LE edema, palps, dizziness or syncope   Did improve quite abit with the steroid tx last visit,  did see allergist woh suggested seeing neurology iwth the more chronic vertigo.   Pt denies new neuro symptoms such as  facial or extremity weakness , or blurred vision.  No fever, wt loss, night sweats, loss of appetite or other constitutional symptoms  Overall good complaicne with meds, including the nasal sprays with no signficant nasal allergic congestion.  Problems Prior to Update: 1)  Preventive Health Care  (ICD-V70.0) 2)  Dizziness  (ICD-780.4) 3)  Osteoarthritis, Cervical Spine  (ICD-721.90) 4)  Av Block, Complete  (ICD-426.0) 5)  Cardiac Pacemaker in Situ-medtronic Enpulse E2dr01  (ICD-V45.01) 6)  Dvt, Hx of  (ICD-V12.51) 7)  Preventive Health Care  (ICD-V70.0) 8)  Allergic Rhinitis  (ICD-477.9) 9)  Anemia-nos  (ICD-285.9) 10)  Glucose Intolerance  (ICD-271.3) 11)  Depression  (ICD-311) 12)  Anxiety  (ICD-300.00) 13)  Gerd  (ICD-530.81) 14)  Hyperlipidemia  (ICD-272.4) 15)  Hypothyroidism  (ICD-244.9) 16)  Hypertension  (ICD-401.9) 17)  Routine General Medical Exam@health  Care Facl  (ICD-V70.0) 18)  Insomnia-sleep Disorder-unspec  (ICD-780.52) 19)  Asthmatic Bronchitis,  Acute  (ICD-466.0) 20)  Nephrolithiasis, Hx of  (ICD-V13.01)  Medications Prior to Update: 1)  Atenolol 50 Mg Tabs (Atenolol) .Marland Kitchen.. 1 By Mouth Once Daily 2)  Enalapril Maleate 10 Mg Tabs (Enalapril Maleate) .... Take 1 Tablet By Mouth Once A Day 3)  Levoxyl 112 Mcg Tabs (Levothyroxine Sodium) .Marland Kitchen.. 1 Tablet By Mouth Once A Day 4)  Simvastatin 80 Mg Tabs (Simvastatin) .Marland Kitchen.. 1po Once Daily 5)  Allegra 6)  Flovent Hfa 110 Mcg/act  Aero (Fluticasone Propionate  Hfa) .... Two Times A Day 7)  Astelin 137 Mcg/spray  Soln (Azelastine Hcl) .... Two Times A Day 8)  Omeprazole 20 Mg  Tbec (Omeprazole) .Marland Kitchen.. 1 By Mouth Once Daily 9)  Ambien Cr 12.5 Mg Cr-Tabs (Zolpidem Tartrate) .Marland Kitchen.. 1 Po At Bedtime As Needed 10)  Adult Aspirin Ec Low Strength 81 Mg  Tbec (Aspirin) .Marland Kitchen.. 1po Once Daily 11)  Meclizine Hcl 12.5 Mg Tabs (Meclizine Hcl) .Marland Kitchen.. 1 - 2 By Mouth Q 6 Hrs As Needed Dizziness 12)  Prednisone 10 Mg Tabs (Prednisone) .... 3po Qd For 3days, Then 2po Qd For 3days, Then 1po Qd For 3days, Then Stop  Current Medications (verified): 1)  Atenolol 50 Mg Tabs (Atenolol) .Marland Kitchen.. 1 By Mouth Once Daily 2)  Enalapril Maleate 10 Mg Tabs (Enalapril Maleate) .... Take 1 Tablet By Mouth Once A  Day 3)  Levoxyl 112 Mcg Tabs (Levothyroxine Sodium) .Marland Kitchen.. 1 Tablet By Mouth Once A Day 4)  Simvastatin 80 Mg Tabs (Simvastatin) .Marland Kitchen.. 1po Once Daily 5)  Fexofenadine Hcl 180 Mg Tabs (Fexofenadine Hcl) .Marland Kitchen.. 1po Once Daily 6)  Flovent Hfa 110 Mcg/act  Aero (Fluticasone Propionate  Hfa) .... Two Times A Day 7)  Astelin 137 Mcg/spray  Soln (Azelastine Hcl) .... Two Times A Day 8)  Omeprazole 20 Mg  Tbec (Omeprazole) .Marland Kitchen.. 1 By Mouth Once Daily 9)  Ambien Cr 12.5 Mg Cr-Tabs (Zolpidem Tartrate) .Marland Kitchen.. 1 Po At Bedtime As Needed 10)  Adult Aspirin Ec Low Strength 81 Mg  Tbec (Aspirin) .Marland Kitchen.. 1po Once Daily 11)  Meclizine Hcl 12.5 Mg Tabs (Meclizine Hcl) .Marland Kitchen.. 1 - 2 By Mouth Q 6 Hrs As Needed Dizziness 12)  Fluticasone Propionate 50 Mcg/act Susp  (Fluticasone Propionate) .... 2 Spray/side Once Daily  Allergies (verified): No Known Drug Allergies  Past History:  Past Medical History: Last updated: 02/25/2010 Hypertension Hypothyroidism Hyperlipidemia DVT, hx of hx of complete heart block Medtronic EnPulse E2DR01 implanted for the above GERD Anxiety Depression glucose intolerance Nephrolithiasis, hx of Anemia-NOS Allergic rhinitis cervical spine DJD  Past Surgical History: Last updated: 12/16/2009 s/p pacemaker-Medtronic EnPulse E2DR01--06 s/p ERCP with sphincterotomy Hemorrhoidectomy s/p uterine polyp  Social History: Last updated: 01/09/2008 Never Smoked Alcohol use-yes Married 4 children  work - Diplomatic Services operational officer  Risk Factors: Smoking Status: never (01/09/2008)  Review of Systems       all otherwise negative per pt -    Physical Exam  General:  alert and overweight-appearing.   Head:  normocephalic and atraumatic.   Eyes:  vision grossly intact, pupils equal, and pupils round.   Ears:  mild bilat tm's erythema, sinus nontender Nose:  nasal dischargemucosal pallor and mucosal edema.   Mouth:  pharyngeal erythema and fair dentition.   Neck:  supple and no masses.   Lungs:  normal respiratory effort and normal breath sounds.   Heart:  normal rate and regular rhythm.   Extremities:  no edema, no erythema  Neurologic:  alert & oriented X3, cranial nerves II-XII intact, strength normal in all extremities, gait normal, DTRs symmetrical and normal, and finger-to-nose normal.     Impression & Recommendations:  Problem # 1:  INTERMITTENT VERTIGO (ICD-780.4)  Her updated medication list for this problem includes:    Fexofenadine Hcl 180 Mg Tabs (Fexofenadine hcl) .Marland Kitchen... 1po once daily    Meclizine Hcl 12.5 Mg Tabs (Meclizine hcl) .Marland Kitchen... 1 - 2 by mouth q 6 hrs as needed dizziness treat as above, f/u any worsening signs or symptoms, unclear etiology, to neurology  Orders: Neurology Referral (Neuro)  Problem #  2:  HYPERTENSION (ICD-401.9)  Her updated medication list for this problem includes:    Atenolol 50 Mg Tabs (Atenolol) .Marland Kitchen... 1 by mouth once daily    Enalapril Maleate 10 Mg Tabs (Enalapril maleate) .Marland Kitchen... Take 1 tablet by mouth once a day  BP today: 118/62 Prior BP: 128/72 (02/25/2010)  Labs Reviewed: K+: 4.4 (01/07/2008) Creat: : 0.8 (01/07/2008)   Chol: 188 (07/06/2008)   HDL: 78.7 (07/06/2008)   LDL: 86 (07/06/2008)   TG: 115 (07/06/2008) stable overall by hx and exam, ok to continue meds/tx as is   Problem # 3:  ALLERGIC RHINITIS (ICD-477.9)  Her updated medication list for this problem includes:    Fexofenadine Hcl 180 Mg Tabs (Fexofenadine hcl) .Marland Kitchen... 1po once daily    Astelin 137 Mcg/spray Soln (Azelastine hcl) .Marland KitchenMarland KitchenMarland KitchenMarland Kitchen  Two times a day    Fluticasone Propionate 50 Mcg/act Susp (Fluticasone propionate) .Marland Kitchen... 2 spray/side once daily stable overall by hx and exam, ok to continue meds/tx as is   Complete Medication List: 1)  Atenolol 50 Mg Tabs (Atenolol) .Marland Kitchen.. 1 by mouth once daily 2)  Enalapril Maleate 10 Mg Tabs (Enalapril maleate) .... Take 1 tablet by mouth once a day 3)  Levoxyl 112 Mcg Tabs (Levothyroxine sodium) .Marland Kitchen.. 1 tablet by mouth once a day 4)  Simvastatin 80 Mg Tabs (Simvastatin) .Marland Kitchen.. 1po once daily 5)  Fexofenadine Hcl 180 Mg Tabs (Fexofenadine hcl) .Marland Kitchen.. 1po once daily 6)  Flovent Hfa 110 Mcg/act Aero (Fluticasone propionate  hfa) .... Two times a day 7)  Astelin 137 Mcg/spray Soln (Azelastine hcl) .... Two times a day 8)  Omeprazole 20 Mg Tbec (Omeprazole) .Marland Kitchen.. 1 by mouth once daily 9)  Ambien Cr 12.5 Mg Cr-tabs (Zolpidem tartrate) .Marland Kitchen.. 1 po at bedtime as needed 10)  Adult Aspirin Ec Low Strength 81 Mg Tbec (Aspirin) .Marland Kitchen.. 1po once daily 11)  Meclizine Hcl 12.5 Mg Tabs (Meclizine hcl) .Marland Kitchen.. 1 - 2 by mouth q 6 hrs as needed dizziness 12)  Fluticasone Propionate 50 Mcg/act Susp (Fluticasone propionate) .... 2 spray/side once daily  Patient Instructions: 1)  Continue all  previous medications as before this visit  2)  You will be contacted about the referral(s) to: neurology 3)  Please schedule a follow-up appointment in 4 months for "yearly medicare exam" Prescriptions: MECLIZINE HCL 12.5 MG TABS (MECLIZINE HCL) 1 - 2 by mouth q 6 hrs as needed dizziness  #60 x 1   Entered and Authorized by:   Corwin Levins MD   Signed by:   Corwin Levins MD on 06/10/2010   Method used:   Print then Give to Patient   RxID:   [id number]

## 2010-12-22 NOTE — Progress Notes (Signed)
Summary: Schedule Colonoscopy   Phone Note Outgoing Call Call back at [facility] Phone [phone]   Call placed by: Harlow Mares CMA Duncan Dull),  July 06, 2010 9:48 AM Call placed to: Patient Summary of Call: Left message on patients machine to call back. patient is due for a colonoscopy Initial call taken by: Harlow Mares CMA Duncan Dull),  July 06, 2010 9:49 AM  Follow-up for Phone Call        Left a message on the patient machine to call back and schedule a previsit and procedure with our office. A letter will be mailed to the patient.   Follow-up by: Harlow Mares CMA Duncan Dull),  July 11, 2010 10:16 AM

## 2010-12-22 NOTE — Letter (Signed)
Summary: [facility]  [facility]   Imported By: Lester Dillard 08/01/2010 09:34:19  _____________________________________________________________________  External Attachment:    Type:   Image     Comment:   External Document

## 2010-12-22 NOTE — Miscellaneous (Signed)
Summary: Orders Update  Clinical Lists Changes  Orders: Added new Test order of Carotid Duplex (Carotid Duplex) - Signed 

## 2010-12-22 NOTE — Miscellaneous (Signed)
Summary: Corrected device information  Clinical Lists Changes  Observations: Added new observation of PPM SERL#: [id number] (12/04/2010 15:15)      PPM Specifications Following MD:  Sherryl Manges, MD     PPM Vendor:  Medtronic     PPM Model Number:  [id number]     PPM Serial Number:  [id number] PPM DOI:  02/13/2005     PPM Implanting MD:  Sherryl Manges, MD  Lead 1    Location: RA     DOI: 12/07/1995     Model #: [id number]     Serial #: [id number]     Status: active Lead 2    Location: RV     DOI: 12/07/1995     Model #: 2952     Serial #: [id number]     Status: active  Magnet Response Rate:  BOL 85 ERI  65  Indications:  CHB  Explantation Comments:  Carelink Pacemaker dependent  PPM Follow Up Pacer Dependent:  Yes      Episodes Coumadin:  No  Parameters Mode:  DDDR     Lower Rate Limit:  60     Upper Rate Limit:  130 Paced AV Delay:  150     Sensed AV Delay:  120

## 2010-12-22 NOTE — Letter (Signed)
Summary: [facility] Consult Scheduled Letter  [facility]  [address] [place], [place] [address]   Phone: [phone]  Fax: [phone]      06/17/2010 MRN: [id number]  DEREK HUNEYCUTT [address] [place], [place]  [address]    Dear Ms. Carlberg,     We have scheduled an appointment for you. At the recommendation of Dr.John, we have scheduled you a consult with [facility] ( Dr Neill Loft Sept 12-2009 at9:00AM. Their phone number is [phone].If this appointment day and time is not convenient for you, please feel free to call the office of the doctor you are being referred to at the number listed above and reschedule the appointment.  [facility]   [address], [place], [place] [address]  Thank you,  Patient Care Coordinator [facility]

## 2010-12-22 NOTE — Letter (Signed)
Summary: Device-Delinquent Phone Journalist, newspaper, Main Office  [address]   [place], [place] [address]   Phone: [phone]  Fax: [phone]     Mar 23, 2010 MRN: [id number]   JOEANN STEPPE [address] [place], [place]  [address]   Dear Ms. Neaves,  According to our records, you were scheduled for a device phone transmission on 03-16-2010.     We did not receive any results from this check.  If you transmitted on your scheduled day, please call us to help troubleshoot your system.  If you forgot to send your transmission, please send one upon receipt of this letter.  Thank you,   Architectural technologist [facility]

## 2010-12-22 NOTE — Letter (Signed)
Summary: Remote Device Check  [employer], Main Office  [address]   [place], [place] [address]   Phone: [phone]  Fax: [phone]     October 20, 2010 MRN: [id number]   GWENLYN HOTTINGER [address] [place], [place]  [address]   Dear Ms. Manville,   Your remote transmission was recieved and reviewed by your physician.  All diagnostics were within normal limits for you.  _____Your next transmission is scheduled for:                       .  Please transmit at any time this day.  If you have a wireless device your transmission will be sent automatically.  __X____Your next office visit is scheduled for: January 2012 with Dr. Graciela Husbands.                             Marland Kitchen Please call our office to schedule an appointment.    Sincerely,  [name], LPN

## 2011-01-10 ENCOUNTER — Encounter: Payer: Self-pay | Admitting: Internal Medicine

## 2011-01-11 ENCOUNTER — Encounter: Payer: Self-pay | Admitting: Internal Medicine

## 2011-01-11 ENCOUNTER — Encounter (INDEPENDENT_AMBULATORY_CARE_PROVIDER_SITE_OTHER): Payer: Medicare Other | Admitting: Internal Medicine

## 2011-01-11 DIAGNOSIS — I442 Atrioventricular block, complete: Secondary | ICD-10-CM

## 2011-01-11 DIAGNOSIS — Z950 Presence of cardiac pacemaker: Secondary | ICD-10-CM

## 2011-01-17 NOTE — Letter (Signed)
Summary: Vic Blackbird MD  Vic Blackbird MD   Imported By: Lester Trego 01/13/2011 09:43:23  _____________________________________________________________________  External Attachment:    Type:   Image     Comment:   External Document

## 2011-01-17 NOTE — Assessment & Plan Note (Signed)
Summary: [employer] CHECK- APPT IS 11 AM. GD  PER PT CALL-MJ RS FROM [facility]...      Allergies Added: NKDA  Visit Type:  PPM-Medtronic  CC:  no complaints.  History of Present Illness:    [name] is seen in followup for complete heart block for which she is status post pacemaker. She is approaching ERI and is being followed transtelophonic monitoring.   The patient denies SOB, chest pain, edema or palpitations   Current Medications (verified): 1)  Atenolol 50 Mg Tabs (Atenolol) .Marland Kitchen.. 1 By Mouth Once Daily 2)  Enalapril Maleate 10 Mg Tabs (Enalapril Maleate) .... Take 1 Tablet By Mouth Once A Day 3)  Levoxyl 112 Mcg Tabs (Levothyroxine Sodium) .Marland Kitchen.. 1 Tablet By Mouth Once A Day 4)  Simvastatin 80 Mg Tabs (Simvastatin) .Marland Kitchen.. 1po Once Daily 5)  Fexofenadine Hcl 180 Mg Tabs (Fexofenadine Hcl) .Marland Kitchen.. 1po Once Daily 6)  Flovent Hfa 110 Mcg/act  Aero (Fluticasone Propionate  Hfa) .... Two Times A Day 7)  Astelin 137 Mcg/spray  Soln (Azelastine Hcl) .... Two Times A Day 8)  Omeprazole 20 Mg  Tbec (Omeprazole) .Marland Kitchen.. 1 By Mouth Once Daily 9)  Adult Aspirin Ec Low Strength 81 Mg  Tbec (Aspirin) .Marland Kitchen.. 1po Once Daily 10)  Fluticasone Propionate 50 Mcg/act Susp (Fluticasone Propionate) .... 2 Spray/side Once Daily  Allergies (verified): No Known Drug Allergies  Past History:  Past Medical History: Last updated: 02/25/2010 Hypertension Hypothyroidism Hyperlipidemia DVT, hx of hx of complete heart block Medtronic EnPulse E2DR01 implanted for the above GERD Anxiety Depression glucose intolerance Nephrolithiasis, hx of Anemia-NOS Allergic rhinitis cervical spine DJD  Past Surgical History: Last updated: 12/16/2009 s/p pacemaker-Medtronic EnPulse E2DR01--06 s/p ERCP with sphincterotomy Hemorrhoidectomy s/p uterine polyp  Family History: Last updated: 01/09/2008 father with prob PE mother with breast cancer and sudden death - ? cardiac  Social History: Last updated:  01/09/2008 Never Smoked Alcohol use-yes Married 4 children  work - Diplomatic Services operational officer  Risk Factors: Smoking Status: never (01/09/2008)  Vital Signs:  Patient profile:   75 year old female Height:      64 inches Weight:      146 pounds BMI:     25.15 Pulse rate:   82 / minute BP sitting:   145 / 86  (left arm) Cuff size:   regular  Vitals Entered By: Caralee Ates CMA (January 11, 2011 11:27 AM)  Physical Exam  General:  The patient was alert and oriented in no acute distress. HEENT Normal.  Neck veins were flat, carotids were brisk.  Lungs were clear.  Heart sounds were regular without murmurs or gallops.  Abdomen was soft with active bowel sounds. There is no clubbing cyanosis or edema. Skin Warm and dry    PPM Specifications Following MD:  Sherryl Manges, MD     PPM Vendor:  Medtronic     PPM Model Number:  [id number]     PPM Serial Number:  [id number] PPM DOI:  02/13/2005     PPM Implanting MD:  Sherryl Manges, MD  Lead 1    Location: RA     DOI: 12/07/1995     Model #: 9562     Serial #: [id number]     Status: active Lead 2    Location: RV     DOI: 12/07/1995     Model #: 6962     Serial #: [id number]     Status: active  Magnet Response Rate:  BOL 85 ERI  65  Indications:  CHB  Explantation Comments:  Carelink Pacemaker dependent  PPM Follow Up Pacer Dependent:  Yes      Episodes Coumadin:  No  Parameters Mode:  DDDR     Lower Rate Limit:  60     Upper Rate Limit:  130 Paced AV Delay:  150     Sensed AV Delay:  120  Impression & Recommendations:  Problem # 1:  AV BLOCK, COMPLETE (ICD-426.0) stable  Her updated medication list for this problem includes:    Atenolol 50 Mg Tabs (Atenolol) .Marland Kitchen... 1 by mouth once daily    Enalapril Maleate 10 Mg Tabs (Enalapril maleate) .Marland Kitchen... Take 1 tablet by mouth once a day    Adult Aspirin Ec Low Strength 81 Mg Tbec (Aspirin) .Marland Kitchen... 1po once daily  Problem # 2:  CARDIAC PACEMAKER IN SITU-[employer] ENPULSE E2DR01 (ICD-V45.01) Device  parameters and data were reviewed and no changes were made  Patient Instructions: 1)  Your physician recommends that you continue on your current medications as directed. Please refer to the Current Medication list given to you today. 2)  Your physician wants you to follow-up in: YEAR WITH DR [facility]  [facility]  You will receive a reminder letter in the mail two months in advance. If you don't receive a letter, please call our office to schedule the follow-up appointment. 3)  CARELINK 03/2011

## 2011-01-24 ENCOUNTER — Encounter: Payer: Self-pay | Admitting: Internal Medicine

## 2011-01-26 NOTE — Cardiovascular Report (Signed)
Summary: Office Visit   Office Visit   Imported By: Roderic Ovens 01/19/2011 16:04:59  _____________________________________________________________________  External Attachment:    Type:   Image     Comment:   External Document

## 2011-02-07 NOTE — Letter (Signed)
Summary: [facility]   Imported By: Sherian Rein 02/02/2011 12:14:35  _____________________________________________________________________  External Attachment:    Type:   Image     Comment:   External Document

## 2011-02-10 ENCOUNTER — Telehealth: Payer: Self-pay | Admitting: Internal Medicine

## 2011-02-10 NOTE — Telephone Encounter (Signed)
Status of clearance from last ov.  Surgery date is 4/9 for Kidney stone. Pam ext  [phone]. fax [phone].

## 2011-02-10 NOTE — Telephone Encounter (Signed)
Pt has ? About surgical clearance--please call him--nt

## 2011-02-15 ENCOUNTER — Telehealth: Payer: Self-pay | Admitting: Internal Medicine

## 2011-02-15 NOTE — Telephone Encounter (Signed)
Dr of Dr Graciela Husbands - will route to him for review

## 2011-02-15 NOTE — Telephone Encounter (Signed)
Ok for surgery

## 2011-02-16 ENCOUNTER — Ambulatory Visit (HOSPITAL_COMMUNITY)
Admission: RE | Admit: 2011-02-16 | Discharge: 2011-02-16 | Disposition: A | Discharge status: Home / Self Care | Payer: Medicare Other | Source: Ambulatory Visit | Attending: Urology | Admitting: Urology

## 2011-02-16 ENCOUNTER — Other Ambulatory Visit (HOSPITAL_COMMUNITY): Payer: Self-pay | Admitting: Urology

## 2011-02-16 ENCOUNTER — Other Ambulatory Visit: Payer: Self-pay | Admitting: Urology

## 2011-02-16 ENCOUNTER — Encounter (HOSPITAL_COMMUNITY): Payer: Medicare Other

## 2011-02-16 DIAGNOSIS — Z01811 Encounter for preprocedural respiratory examination: Secondary | ICD-10-CM

## 2011-02-16 DIAGNOSIS — Z01818 Encounter for other preprocedural examination: Secondary | ICD-10-CM | POA: Insufficient documentation

## 2011-02-16 DIAGNOSIS — Z01810 Encounter for preprocedural cardiovascular examination: Secondary | ICD-10-CM | POA: Insufficient documentation

## 2011-02-16 DIAGNOSIS — Z01812 Encounter for preprocedural laboratory examination: Secondary | ICD-10-CM | POA: Insufficient documentation

## 2011-02-16 LAB — BASIC METABOLIC PANEL
BUN: 28 mg/dL — ABNORMAL HIGH (ref 6–23)
CO2: 26 mEq/L (ref 19–32)
Calcium: 9.7 mg/dL (ref 8.4–10.5)
Chloride: 109 mEq/L (ref 96–112)
Creatinine, Ser: 0.87 mg/dL (ref 0.4–1.2)
GFR calc Af Amer: >60 mL/min (ref >60)
GFR calc non Af Amer: >60 mL/min (ref >60)
Glucose, Bld: 98 mg/dL (ref 70–99)
Potassium: 4.3 mEq/L (ref 3.5–5.1)
Sodium: 142 mEq/L (ref 135–145)

## 2011-02-16 LAB — CBC
HCT: 37 % (ref 36.0–46.0)
Hemoglobin: 11.9 g/dL — ABNORMAL LOW (ref 12.0–15.0)
MCH: 30.7 pg (ref 26.0–34.0)
MCHC: 32.2 g/dL (ref 30.0–36.0)
MCV: 95.4 fL (ref 78.0–100.0)
Platelets: 197 10*3/uL (ref 150–400)
RBC: 3.88 MIL/uL (ref 3.87–5.11)
RDW: 13.3 % (ref 11.5–15.5)
WBC: 5.5 10*3/uL (ref 4.0–10.5)

## 2011-02-16 LAB — SURGICAL PCR SCREEN
MRSA, PCR: NEGATIVE
Staphylococcus aureus: NEGATIVE

## 2011-02-16 NOTE — Telephone Encounter (Signed)
Left message for Pam that Dr. Graciela Husbands notes pt is OK for surgery and note documenting this will be faxed to [phone]. Left message to call back if any questions. Will fax this note.

## 2011-02-16 NOTE — Telephone Encounter (Signed)
Left message to call back  

## 2011-02-17 NOTE — Telephone Encounter (Signed)
LMTCB ./CY 

## 2011-02-17 NOTE — Telephone Encounter (Signed)
FYI PER PT IS HAVING A KIDNEY STONE REMOVED VIA SCOPE PROCEDURE./CY

## 2011-02-17 NOTE — Telephone Encounter (Signed)
Pt rtn call 

## 2011-02-27 ENCOUNTER — Ambulatory Visit (HOSPITAL_COMMUNITY)
Admission: RE | Admit: 2011-02-27 | Discharge: 2011-02-27 | Disposition: A | Discharge status: Home / Self Care | Payer: Medicare Other | Source: Ambulatory Visit | Attending: Urology | Admitting: Urology

## 2011-02-27 DIAGNOSIS — I10 Essential (primary) hypertension: Secondary | ICD-10-CM | POA: Insufficient documentation

## 2011-02-27 DIAGNOSIS — Z950 Presence of cardiac pacemaker: Secondary | ICD-10-CM | POA: Insufficient documentation

## 2011-02-27 DIAGNOSIS — N200 Calculus of kidney: Secondary | ICD-10-CM | POA: Insufficient documentation

## 2011-02-28 NOTE — Op Note (Signed)
Danielle [name], Danielle [name]            ACCOUNT NO.:  [id number]  MEDICAL RECORD NO.:  [id number]           PATIENT TYPE:  O  LOCATION:  DAYL                         FACILITY:  [facility]  PHYSICIAN:  Naydeen Speirs C. Vernie Ammons, M.D.  DATE OF BIRTH:  October 23, 1936  DATE OF PROCEDURE:  02/27/2011 DATE OF DISCHARGE:                              OPERATIVE REPORT   PREOPERATIVE DIAGNOSIS:  Right renal calculus.  POSTOPERATIVE DIAGNOSIS:  Right renal calculus.  PROCEDURES: 1. Cystoscopy with right retrograde pyelogram including     interpretation. 2. Right ureteroscopy with laser lithotripsy. 3. Ureteroscopic stone extraction. 4. Double-J stent placement (5-French, 26 cm, no string)  SURGEON:  Anayansi Rundquist C. Vernie Ammons, M.D.  ANESTHESIA:  General.  SPECIMENS:  Stone given to patient.  BLOOD LOSS:  Minimal.  COMPLICATIONS:  None.  INDICATIONS:  The patient is a 75 year old female with a known right lower pole renal calculus.  She also has a history of chronic, recurrent cystitis and was felt to possibly have a stone harboring bacteria which could be causing her recurrent infections.  The infections have been due to E coli which is a non-urease forming bacteria.  The stone had Hounsfield units of only approximately 600 and we therefore discussed the treatment options and the patient has elected to proceed with ureteroscopic management of her stone.  The risks, complications, alternatives and limitations were discussed.  DESCRIPTION OF OPERATION:  After informed consent, the patient was brought to the major OR, placed on the table and administered general anesthesia.  She was then moved to the dorsal lithotomy position.  Her genitalia was sterilely prepped and draped and an official time-out was then performed.  The 22-French cystoscope with 12-degree lens was then passed into the bladder and the bladder was fully and systematically inspected.  It was noted to be free of any tumor, stones or inflammatory  lesions.  Ureteral orifices were of normal configuration and position.  A 6-French open-ended ureteral catheter was then passed through the cystoscope and into the distal right ureter through the ureteral orifice.  I then proceeded with formal retrograde pyelogram in standard fashion by injecting full strength contrast through the open-ended catheter under direct fluoroscopic control.  This revealed a normal ureter throughout its length.  The stone which was previously located in the lower pole was seen as a filling defect in the renal pelvis and appeared to be freely floating.  The remainder of the collecting system was entirely normal.  A 0.038-inch floppy tip guidewire was then passed through the open-ended catheter, up the right ureter and in the area of the renal pelvis.  I then passed the ureteral access sheath over the guide wire and gently dilated the intramural ureter and was able to pass the access sheath to the level of the ureteropelvic junction where the inner portion and guidewire were then removed.  The flexible, digital 6-French ureteroscope was then passed through the access sheath and into the area of the renal pelvis.  I did a systematic inspection of each of the calyces and located the stone.  It was found in upper pole calix and I therefore proceeded to fragment the  stone as it was noted to be a single stone.  It appeared to be a typical calcium oxalate stone and did not have the typical configuration of the struvite stone.  I used the 200 micron holmium laser fiber and passed this through the ureteroscope and fragmented the stone into smaller portions. I then used the 0-tipped nitinol basket to grasp each of these fragments and extracted these through the access sheath until all stone fragments were gone.  There were some sand like the portions of stone remaining that were too small for removal and therefore I felt these would pass and proceeded with placement of  the double-J stent.  The 5-French, the 6-cm Polaris stent was then passed over the guidewire and the cystoscope was back loaded.  I then passed the stent in the area of renal pelvis and as I removed the guidewire, I noted good curl in the renal pelvis.  The bladder was then drained and the patient was awakened and taken to recovery room in stable and satisfactory condition.  She tolerated the procedure well and there were no intraoperative complications.  She will be given a prescription for Vicodin HP #36 as well as Pyridium 200 mg #36 with followup in my office next week.  She was given her stone and instructed to bring that upon return for compositional analysis.     Elimelech Houseman C. Vernie Ammons, M.D.     MCO/MEDQ  D:  02/27/2011  T:  02/27/2011  Job:  [id number]  Electronically Signed by Ihor Gully M.D. on 02/28/2011 06:51:11 AM

## 2011-03-29 ENCOUNTER — Other Ambulatory Visit: Payer: Self-pay

## 2011-03-29 ENCOUNTER — Ambulatory Visit (INDEPENDENT_AMBULATORY_CARE_PROVIDER_SITE_OTHER): Payer: Medicare Other | Admitting: *Deleted

## 2011-03-29 DIAGNOSIS — I442 Atrioventricular block, complete: Secondary | ICD-10-CM

## 2011-03-29 NOTE — Progress Notes (Signed)
Pacer check in clinic  

## 2011-04-04 NOTE — Assessment & Plan Note (Signed)
[facility]                         ELECTROPHYSIOLOGY OFFICE NOTE   Danielle [name], Danielle [name]                   MRN:          [id number]  DATE:01/26/2009                            DOB:          Dec 02, 1935    [name] is seen in followup for complete heart block.  She is status  post pacemaker for the above.  She denies complaints of chest pain or  shortness of breath.   Medications are unchanged including atenolol, Crestor, enalapril 10,  Lipitor.   On examination, blood pressure is 130/73 with a pulse of 71, weight was  151.  Lungs were clear.  Heart sounds were regular.  Extremities were  without edema.   Interrogation of Medtronic pulse generator demonstrates P-wave of 1.4  with impedance of 674, threshold of 1.5 at 0.4.  There was no intrinsic  ventricular rhythm, the impedance was 830 with threshold of 1.375 at  0.4.   IMPRESSION:  1. Complete heart block.  2. Status post pacer for the above.  3. Hypertension.   [name] is doing well.  I will see her again in 6 months' time.     Duke Salvia, MD, [facility]  Electronically Signed    SCK/MedQ  DD: 01/26/2009  DT: 01/27/2009  Job #: [id number]

## 2011-04-04 NOTE — Assessment & Plan Note (Signed)
[facility]                         GASTROENTEROLOGY OFFICE NOTE   Danielle [name]                   MRN:          [id number]  DATE:12/25/2007                            DOB:          January 19, 1936    Danielle [name] returns with mild focal right upper quadrant pain that does  not appear to change with any particular digestive activity, movement or  any other activity.  Occasionally she notes the pain in the epigastric  area or left upper quadrant.  She had similar symptoms at her office  visit in January of 2007, and she was treated with Prilosec for 4 weeks,  and her symptoms resolved.  She did not return for followup.  She has no  nausea, vomiting, change in bowel habits, dysphagia, odynophagia, melena  or hematochezia.  She previously underwent upper endoscopy in May of  2004 which showed a hiatal hernia and erosive gastritis.   CURRENT MEDICATIONS:  Listed on the chart, updated and reviewed.   MEDICATION ALLERGIES:  None known.   PHYSICAL EXAMINATION:  No acute distress.  Weight 147.4 pounds, blood pressure is 132/76, pulse 72 and regular.  CHEST:  Clear to auscultation bilaterally.  CARDIAC:  Regular rate and rhythm without murmurs.  ABDOMEN:  Soft and nontender with normoactive bowel sounds.  No palpable  organomegaly, masses, or hernias.   ASSESSMENT AND PLAN:  1. Probable recurrent gastritis.  Begin omeprazole 20 mg p.o. q.a.m.      for the next 4 weeks.  If her symptoms resolve, she may use the      medication on an on-demand basis.  If her symptoms do not      completely resolve, she was advised to return for followup for      further evaluation.  2. Colorectal cancer screening.  She is recommended to undergo routine      screening colonoscopy in January 2011.     Danielle [name]. Russella Dar, MD, [facility]  Electronically Signed    MTS/MedQ  DD: 12/25/2007  DT: 12/26/2007  Job #: [id number]

## 2011-04-04 NOTE — Assessment & Plan Note (Signed)
[facility]                         ELECTROPHYSIOLOGY OFFICE NOTE   Danielle [name]                   MRN:          [id number]  DATE:12/31/2007                            DOB:          1936/02/27    Danielle [name] has a history of complete heart block, and is device  dependent.  She has had 3 episodes over the last couple of months, where  while laughing she has had the sensation that she is about to pass out.  It reminds her of the sensation that occurs when a magnet is applied to  her pacemaker.  This is new in the last couple of months, as noted.   Her medications are unchanged.   PHYSICAL EXAMINATION:  On examination her blood pressure was 135/85.  Her pulse was 75.  LUNGS:  Clear.  HEART:  Sounds were regular.  EXTREMITIES:  Without edema.   INTERROGATION:  Of her pulse generator demonstrated no episodes of  device malfunction.  Her impedance and threshold surveillance remain  stable.  Her P wave was one with impedance of 655 a threshold of 0.75 at  0.64.  There is no intrinsic ventricular rate, and impedance was 854  with a threshold of 1.5 at 0.4.   IMPRESSION:  1. Complete heart block.  2. Chronotropic incompetence.  3. Status post pacer for the above.  4. A history of previous pacemaker failure.  5. Episodes of presyncope similar to a magnet response, question a      loss of atrial capture.   I have suggested to [name] that if she has more episodes of  dizziness, we will need to use a monitor to try to elucidate whether  there is any issue with her pacemaker that is contributing.  She is  otherwise well; and we will see her, again, in 1 year's time.     Duke Salvia, MD, [facility]  Electronically Signed    SCK/MedQ  DD: 12/31/2007  DT: 01/01/2008  Job #: [id number]

## 2011-04-07 NOTE — Op Note (Signed)
[facility]  Patient:    Danielle [name], Danielle [name]                     MRN: [id number]. Date: 05/29/01 Attending:  Rozanna Boer., M.D.                           Operative Report  PREOPERATIVE DIAGNOSIS:  Right lower ureteral calculus with pain and hematuria.  POSTOPERATIVE DIAGNOSIS:  Right lower ureteral calculus with pain and hematuria.  OPERATION:  Cystoscopy, right retrograde pyelogram, and ureteroscopy with stone extraction with holmium laser.  ANESTHESIA:  General.  SURGEON:  Rozanna Boer., M.D.  BRIEF HISTORY:  This 74 year old white female was admitted with intermittent pain in her right flank off and on for two years.  It has been worse the last week.  She has also had hematuria with class 3 cytology.  Cystoscopy was negative, but IVP showed a 4 x 7 mm stone in the right distal ureter with partial obstruction.  She enters now for ureteroscopy and holmium lasertripsy. She had had previous spinal surgery in 1979 after a horse accident and pacemaker changed x 3 and is pacemaker-dependent.  She takes Synthroid, atenolol, and Claritin only.  DESCRIPTION OF PROCEDURE:  The patient was placed on the operating table in the dorsal lithotomy position and after the satisfactory induction of general anesthesia, was prepped and draped with Betadine in the usual sterile fashion. Preop KUB showed the faint stone in the distal right ureter, present as it was before.  The bladder was inspected and found to be free of any mucosal lesions as it was in the office.  An occlusive retrograde was done, demonstrating a filling defect in the distal right ureter as it was seen on the previous IVP. Past that point, the .038 floppy-tipped guidewire through the open-ended catheter up to the level of the kidney under fluoroscopy, and the open-ended catheter was then removed.  In taking the #6 short ureteroscope, I passed into the orifice up to the  stone which was present and actually beyond it.  I then calibrated the holmium laser and broke the stone into numerous small pieces, several of which were retrieved with the four-wire basket.  When all of the stone fragments were removed, I removed the scope and then passed a 6 French x 26 cm length double-J ureteral stent over the guidewire which was then removed.  The stent was in good position, and the string came out through the urethra and was taped to the lower abdomen.  The bladder was drained and the scope removed.  The patient was taken to the recovery area in good condition to be later discharged as an outpatient and will come back to the office in two days to have this stringed stent removed.  The stone was sent for analysis. DD:  05/29/01 TD:  05/29/01 Job: [id number]

## 2011-04-07 NOTE — Op Note (Signed)
Danielle [name], Danielle [name] NO.:  [id number]   MEDICAL RECORD NO.:  [id number]          PATIENT TYPE:  OIB   LOCATION:  2899                         FACILITY:  [facility]   PHYSICIAN:  Duke Salvia, M.D.  DATE OF BIRTH:  1936-09-21   DATE OF PROCEDURE:  02/13/2005  DATE OF DISCHARGE:                                 OPERATIVE REPORT   PREOPERATIVE DIAGNOSIS:  Complete heart block with previously implanted  pacemaker now at end of life.   POSTOPERATIVE DIAGNOSIS:  Complete heart block with previously implanted  pacemaker now at end of life.   PROCEDURE:  Explantation of previously implanted device and implantation of  new device.   DESCRIPTION OF PROCEDURE:  Following the attainment of informed consent, the  patient was brought to the electrophysiology laboratory and placed on the  fluoroscopic table in the supine position.  After routine prep and drape,  lidocaine was infiltrated along the line of the previous incision and  carried down to the level of the pacemaker pocket using sharp dissection.  The pocket was opened and the device was freed up.  The previously implanted  pacemaker was explanted.  Interrogation of the previously implanted atrial  lead model 4068, serial [id number] demonstrated P wave of 2.6 with an  impedance of 591, a threshold of 1.5 volts at 0.5 msec.  The previously  implanted ventricular lead model 4608, serial #[id number] demonstrated no  intrinsic ventricular rhythm with an impedance of 807, a threshold of 1.8  volts at 0.5 msec and current threshold of 3.3 __________.  These leads were  then attached to a Medtronic Impulse E2DR01 pulse generator, serial  #[id number].  Through the device, __________  pacing was identified.  The  pocket was copiously irrigated with antibiotic containing saline solution  and hemostasis was assured and the leads and the pulse generator were then  placed in the pocket.  The wound was closed in three layers in the  normal  fashion.  The wound was washed and dried and Benzoin and Steri-Strips and a  dressing were applied.  Needle counts, sponge counts and instrument counts  were correct at the end of the procedure according to the staff.  The  patient tolerated the procedure without apparent complications.      SCK/MEDQ  D:  02/13/2005  T:  02/13/2005  Job:  [id number]   cc:   Electrophysiology Lab   [facility]

## 2011-04-07 NOTE — Discharge Summary (Signed)
Danielle [name], Danielle [name] NO.:  [id number]   MEDICAL RECORD NO.:  [id number]          PATIENT TYPE:  OIB   LOCATION:  2899                         FACILITY:  [facility]   PHYSICIAN:  Duke Salvia, M.D.  DATE OF BIRTH:  11/01/1936   DATE OF ADMISSION:  02/13/2005  DATE OF DISCHARGE:  02/13/2005                                 DISCHARGE SUMMARY   DISCHARGE DIAGNOSES:  1.  History of complete heart block.  2.  Status post percutaneous transvenous demand pacemaker with three prior      generator changes in her history.  3.  Current pacemaker generator is at end-of-life.   SECONDARY DIAGNOSES:  1.  Hypertension.  2.  Hypothyroidism.   PROCEDURE:  February 13, 2005, implantation of Medtronic EnPulse [id number]  pacemaker. This is a dual-chamber device, probably DDD-R. The patient has  had no complications in the post procedure period at [facility],  discharging the same day.   The patient will discharge on her preoperative medications that include:  1.  Levothyroxine 112 mcg daily.  2.  Atenolol 50 mg daily.  3.  Enalapril 2.5 mg daily.  4.  Lipitor 40 mg daily at bedtime.  5.  Allegra 180 mg daily.   She is to remove the bandage at the incision site in the morning of Tuesday,  March 28, and she is to keep it open to the air. She has no need to apply  anything to the incision. She is asked to keep it dry until Monday, April 3,  to sponge bathe until that time. She is to avoid vigorous movement of her  left upper extremity for the next two days. She will see Dr. Graciela Husbands in follow-  up at [facility], [address], on Monday, February 27, 2005, at 9:30 in the morning. This will be an incision check.   BRIEF HISTORY:  The patient has a history of complete heart block. She is  status post permanent pacemaker since the age of 26.  In other words she has  had a pacemaker placed in the last 19 years. During that time she has had  generator replacements  and this will make her 4th. Transtelephonic monitor  indicates that the pacemaker had approached elective replacement indicator.  The patient had seen Dr. Graciela Husbands on Friday, March 24, with lightheadedness and  exercise intolerance secondary to most probably secondary to incipient  generator failure. Because of this the patient will undergo generator  replacement on Monday, March 25. This is the earliest convenient time by Dr.  Duke Salvia.   HOSPITAL COURSE:  The patient presented electively on March 27. She  underwent generator change without complications. A Medtronic device was  placed as dictated above. She has had no post procedure complications,  discharging the same day with medications and with follow up with Dr. Graciela Husbands  on April 10 and with wound care as described.      GM/MEDQ  D:  02/13/2005  T:  02/13/2005  Job:  [id number]

## 2011-04-07 NOTE — H&P (Signed)
NAME:  Danielle [name], Danielle [name]                      ACCOUNT NO.:  [id number]   MEDICAL RECORD NO.:  [id number]                   PATIENT TYPE:  AMB   LOCATION:  ENDO                                 FACILITY:  [facility]   PHYSICIAN:  Malcolm T. Russella Dar, M.D. [facility]          DATE OF BIRTH:  1935-12-20   DATE OF ADMISSION:  04/23/2003  DATE OF DISCHARGE:                                HISTORY & PHYSICAL   PROBLEM:  Recurrent right sided abdominal pain with dilated common bile  duct.   HISTORY OF PRESENT ILLNESS:  Danielle [name] is a pleasant 75 year old white female  known to Dr. Oliver Barre and Dr. Graciela Husbands, who has history of long term  Pacemaker for bradyarrhythmia, history of ureteral lithiasis,  hypothyroidism, GERD, remote DVT with childbirth and prior lumbar surgery.  At this time, she has had complaints of right lower quadrant pain,  recurrent, burning in nature with some radiation into the right flank and  apparently somewhat positional or movement aggravated. She has not had any  associated nausea or vomiting, change in her bowel habits, fever or chills.  She has lost about 5 pounds although says she has changed her diet to a very  bland healthier diet, which seems to have helped her symptoms somewhat. She  underwent CT scan in April which showed a 6-7 mm calculus in the mid pole of  the left kidney. Otherwise, negative study. Recent labs were unremarkable  with the exception of a mildly elevated lipase at 37. Abdominal ultrasound  was subsequently done on March 19, 2003 which showed a common bile duct  dilated at 10.8 mm. There was no stone or lesion noted and the gallbladder  was felt to be normal. Consideration was given to MRCP versus ERCP due to  her Pacemaker. MRCP was not an option and ERCP was done today with finding  of a diffusely dilated common bile duct with poor drainage into the small  bowel. No lesion or stone was seen. Sphincterotomy was done with good  subsequent drainage. It was  felt that she may have papillary stenosis or  sphincter of Oddi dysfunction.  She is admitted post procedure for overnight  observation.   CURRENT MEDICATIONS:  1. Synthroid 175 mcg q.d.  2. Enalapril 2.5 q.d.  3. Atenolol 50 q.d.  4. Allegra 180 q.d.   ALLERGIES:  No known drug allergies.   PAST MEDICAL HISTORY:  As outlined above. She has had a pacemaker since age  67.   SOCIAL HISTORY:  The patient is married. Has four grown children. [place]. No tobacco. She drinks two glasses of white wine per day  generally and is a Clinical research associate.   FAMILY HISTORY:  Negative for GI disease. Positive for breast CA in her  mother.   REVIEW OF SYSTEMS:  Are reviewed and negative other than outlined above.   PHYSICAL EXAMINATION:  GENERAL: A well developed white female in no  acute  distress post procedure. She is alert and oriented.  VITAL SIGNS: Temperature 97. Blood pressure 116/68. Pulse of 64. Saturation  is 99.  HEENT: Nontraumatic, normocephalic. Extraocular muscles intact. Pupils are  equal, round, and reactive to light and accommodation. Sclera anicteric.  NECK: Supple without nodes.  CARDIOVASCULAR: Regular rate and rhythm. With S1 and S2. Pacemaker in the  left chest.  PULMONARY: Clear to A&P.  ABDOMEN: Soft. Bowel sounds are active. She is basically nontender. There is  no mass or hepatosplenomegaly.  RECTAL: Examination is not done at this time.  EXTREMITIES: Without clubbing, cyanosis, or edema. Pulses are intact and 2+.  NEURO: Grossly nonfocal.   LABORATORY DATA:  Pending on admission.   IMPRESSION:  18. 75 year old white female status post endoscopic retrograde     cholangiopancreatography and sphincterotomy with finding of a dilated     common bile duct with poor drainage, consistent with papillary stenosis     or sphincter of Oddi dysfunction. The patient admitted post procedure for     observation.  2. Recurrent right sided abdominal pain. Unclear if  this is secondary to the     above findings. Rule out biliary dyskinesia. Rule out  musculoskeletal     origin for pain.  3. History of ureterolithiasis with current left renal calculus.  4. Status post pacemaker for bradyarrhythmia.  5. Hypothyroid.   PLAN:  The patient is admitted to 24 hour observation. She will be started  on clear liquids. Will advance her diet as she tolerates. Check labs and  plan discharge home in a.m. if she is stable. Will consider CCK HIDA scan as  an outpatient if her symptoms persist.     Amy Esterwood, P.A.-C. [facility]                Malcolm T. Russella Dar, M.D. [facility]    AE/MEDQ  D:  04/23/2003  T:  04/23/2003  Job:  [id number]   cc:   Duke Salvia, M.D.

## 2011-04-07 NOTE — Assessment & Plan Note (Signed)
[facility]                         ELECTROPHYSIOLOGY OFFICE NOTE   Danielle [name], Danielle [name]                   MRN:          [id number]  DATE:11/02/2006                            DOB:          1936-11-17    Danielle [name] comes in today.  She is status post pacer implantation for  complete heart block.  She is doing well with an estimated 5 years of  longevity.   She notes that her blood pressure has been up a little bit more.  It is  good today.  I should note that her enalapril dose is quite low at 2.5  mg a day.  Her other medications are notable for the atenolol at 50 mg a  day, thyroxine, and Lipitor.   On examination today it is noted her blood pressure was fine, 134/78.  Her lungs were clear, heart sounds were regular, and extremity were  without edema.   Interrogation of her Medtronic Impulse generator demonstrated P wave of  2 with impedance of 709 and threshold of 2 volts at 0.4.  There is no  intrinsic ventricular rhythm, the impedance was 842, and threshold was  1.5 at 0.4.   Based on the above, the device was reprogrammed to allow for adequate  safety margins.   We will plan to see her again in 1 year's time and I have told her to  think about having her doctor increase her enalapril as needed for blood  pressure.     Duke Salvia, MD, [facility]  Electronically Signed    SCK/MedQ  DD: 11/02/2006  DT: 11/02/2006  Job #: [id number]   cc:   Corwin Levins, MD

## 2011-04-13 ENCOUNTER — Encounter: Payer: Medicare Other | Admitting: *Deleted

## 2011-05-23 LAB — PACEMAKER DEVICE OBSERVATION
AL AMPLITUDE: 2.8 mv
AL AMPLITUDE: 2.8 mv
AL THRESHOLD: 1.5 V
BAMS-0001: 175 {beats}/min
BAMS-0001: 175 {beats}/min
BMOD-0003: 30
BMOD-0005: 95 {beats}/min
BRDY-0002: 60 {beats}/min
BRDY-0003: 130 {beats}/min
BRDY-0004: 130 {beats}/min
BSEN-0002RV: 2.8 mv
BSEN-0004RV: 230 ms
BSEN-0005RA: 180 ms
DEV-0004LDO: 4068
DEV-0006LDO: 19970117
DEV-0006LDO: 19970117
DEV-0006PM: 20060327
DEV-0023LDO: 0
DEV-0023PM: 0
EVAL-0003E4: NEGATIVE
EVAL-0017E4: NEGATIVE
RV LEAD IMPEDENCE PM: 835 Ohm
VENTRICULAR PACING PM: 100

## 2011-06-29 ENCOUNTER — Encounter: Payer: Medicare Other | Admitting: *Deleted

## 2011-06-30 ENCOUNTER — Encounter: Payer: Self-pay | Admitting: *Deleted

## 2011-07-07 ENCOUNTER — Other Ambulatory Visit: Payer: Self-pay | Admitting: Internal Medicine

## 2011-07-28 ENCOUNTER — Encounter: Payer: Self-pay | Admitting: Internal Medicine

## 2011-07-28 DIAGNOSIS — Z0000 Encounter for general adult medical examination without abnormal findings: Secondary | ICD-10-CM | POA: Insufficient documentation

## 2011-07-28 DIAGNOSIS — R7302 Impaired glucose tolerance (oral): Secondary | ICD-10-CM | POA: Insufficient documentation

## 2011-07-28 HISTORY — DX: Impaired glucose tolerance (oral): R73.02

## 2011-07-31 ENCOUNTER — Other Ambulatory Visit (INDEPENDENT_AMBULATORY_CARE_PROVIDER_SITE_OTHER): Payer: Medicare Other

## 2011-07-31 ENCOUNTER — Encounter: Payer: Self-pay | Admitting: Internal Medicine

## 2011-07-31 ENCOUNTER — Other Ambulatory Visit: Payer: Self-pay | Admitting: Internal Medicine

## 2011-07-31 ENCOUNTER — Ambulatory Visit (INDEPENDENT_AMBULATORY_CARE_PROVIDER_SITE_OTHER): Payer: Medicare Other | Admitting: Internal Medicine

## 2011-07-31 VITALS — BP 124/80 | HR 68 | Temp 97.9°F | Ht 64.0 in | Wt 138.0 lb

## 2011-07-31 DIAGNOSIS — R7302 Impaired glucose tolerance (oral): Secondary | ICD-10-CM

## 2011-07-31 DIAGNOSIS — R1011 Right upper quadrant pain: Secondary | ICD-10-CM

## 2011-07-31 DIAGNOSIS — E039 Hypothyroidism, unspecified: Secondary | ICD-10-CM

## 2011-07-31 DIAGNOSIS — E785 Hyperlipidemia, unspecified: Secondary | ICD-10-CM

## 2011-07-31 DIAGNOSIS — R7309 Other abnormal glucose: Secondary | ICD-10-CM

## 2011-07-31 LAB — URINALYSIS, ROUTINE W REFLEX MICROSCOPIC
Leukocytes, UA: NEGATIVE
Specific Gravity, Urine: >=1.03 (ref 1.000–1.030)
Urobilinogen, UA: 0.2 (ref 0.0–1.0)

## 2011-07-31 LAB — CBC WITH DIFFERENTIAL/PLATELET
Basophils Relative: 0.4 % (ref 0.0–3.0)
Eosinophils Relative: 2.3 % (ref 0.0–5.0)
HCT: 37.7 % (ref 36.0–46.0)
Hemoglobin: 12.8 g/dL (ref 12.0–15.0)
Lymphs Abs: 1.7 10*3/uL (ref 0.7–4.0)
MCV: 97.3 fl (ref 78.0–100.0)
Monocytes Absolute: 0.6 10*3/uL (ref 0.1–1.0)
Neutro Abs: 3.1 10*3/uL (ref 1.4–7.7)
Platelets: 154 10*3/uL (ref 150.0–400.0)
RBC: 3.88 Mil/uL (ref 3.87–5.11)
WBC: 5.5 10*3/uL (ref 4.5–10.5)

## 2011-07-31 LAB — HEMOGLOBIN A1C: Hgb A1c MFr Bld: 5.4 % (ref 4.6–6.5)

## 2011-07-31 NOTE — Assessment & Plan Note (Signed)
I suspect MSK actually, but will check labs, consider u/s if not improved with holding on the working with the horses for 1 wk

## 2011-07-31 NOTE — Progress Notes (Signed)
Subjective:    Patient ID: Danielle [name], female    DOB: 12-30-1935, 75 y.o.   MRN: [id number]  HPI  Here to c/o several days gen'd abd discomfort;  Denies worsening reflux, dysphagia, n/v, bowel change or blood, as well as  Denies urinary symptoms such as dysuria, frequency, urgency,or hematuria.  No fever, chills, back pain.  Pt denies chest pain, increased sob or doe, wheezing, orthopnea, PND, increased LE swelling, palpitations, dizziness or syncope.  Pt denies new neurological symptoms such as new headache, or facial or extremity weakness or numbness   Pt denies polydipsia, polyuria.   Pt denies fever, wt loss, night sweats, loss of appetite, or other constitutional symptoms.  Denies hyper or hypo thyroid symptoms such as voice, skin or hair change.  Past Medical History  Diagnosis Date  . NEPHROLITHIASIS, HX OF 01/09/2008  . DVT, HX OF 01/09/2008  . INSOMNIA-SLEEP DISORDER-UNSPEC 06/15/2009  . Dizziness and giddiness 02/25/2010  . OSTEOARTHRITIS, CERVICAL SPINE 02/25/2010  . GERD 01/09/2008  . ALLERGIC RHINITIS 01/09/2008  . ASTHMATIC BRONCHITIS, ACUTE 10/20/2008  . AV BLOCK, COMPLETE 12/16/2009  . HYPERTENSION 01/09/2008  . DEPRESSION 01/09/2008  . ANXIETY 01/09/2008  . ANEMIA-NOS 01/09/2008  . HYPERLIPIDEMIA 01/09/2008  . GLUCOSE INTOLERANCE 01/09/2008  . HYPOTHYROIDISM 01/09/2008  . Impaired glucose tolerance 07/28/2011   Past Surgical History  Procedure Date  . Pacemaker placement     MedTronic EnPulse [id number]  . Ercp w/ sphicterotomy   . Hemorrhoid surgery   . Polypectomy     Uterine    reports that she has never smoked. She does not have any smokeless tobacco history on file. She reports that she drinks alcohol. Her drug history not on file. family history includes Cancer in her mother; Pulmonary embolism in her father; and Sudden death in her mother. No Known Allergies Current Outpatient Prescriptions on File Prior to Visit  Medication Sig Dispense Refill  . aspirin 81 MG EC  tablet Take 81 mg by mouth daily.        Marland Kitchen atenolol (TENORMIN) 50 MG tablet TAKE 1 TABLET BY MOUTH ONCE DAILY  30 tablet  0  . azelastine (ASTELIN) 137 MCG/SPRAY nasal spray Place 1 spray into the nose 2 (two) times daily. Use in each nostril as directed       . enalapril (VASOTEC) 10 MG tablet TAKE 1 TABLET BY MOUTH ONCE DAILY  30 tablet  0  . fexofenadine (ALLEGRA) 180 MG tablet Take 180 mg by mouth daily.        . fluticasone (FLOVENT HFA) 110 MCG/ACT inhaler Inhale 1 puff into the lungs 2 (two) times daily.        Marland Kitchen LEVOXYL 112 MCG tablet TAKE 1 TABLET BY MOUTH ONCE DAILY  30 tablet  0  . Omeprazole 20 MG TBEC Take 1 tablet by mouth daily.        . simvastatin (ZOCOR) 80 MG tablet Take 80 mg by mouth at bedtime.         Review of Systems Review of Systems  Constitutional: Negative for diaphoresis and unexpected weight change.  HENT: Negative for drooling and tinnitus.   Eyes: Negative for photophobia and visual disturbance.  Respiratory: Negative for choking and stridor.   Gastrointestinal: Negative for vomiting and blood in stool.  Genitourinary: Negative for hematuria and decreased urine volume.      Objective:   Physical Exam BP 124/80  Pulse 68  Temp(Src) 97.9 F (36.6 C) (Oral)  Ht 5\' 4"  (1.626  m)  Wt 138 lb (62.596 kg)  BMI 23.69 kg/m2  SpO2 98% Physical Exam  VS noted, ? Mild ill appearing Constitutional: Pt appears well-developed and well-nourished.  HENT: Head: Normocephalic.  Right Ear: External ear normal.  Left Ear: External ear normal.  Eyes: Conjunctivae and EOM are normal. Pupils are equal, round, and reactive to light.  Neck: Normal range of motion. Neck supple.  Cardiovascular: Normal rate and regular rhythm.   Pulmonary/Chest: Effort normal and breath sounds normal.  Abd:  Soft, NT, non-distended, + BS - benign appearing, no flank tender Neurological: Pt is alert. No cranial nerve deficit.  Skin: Skin is warm. No erythema.  Psychiatric: Pt behavior is  normal. Thought content normal.         Assessment & Plan:

## 2011-07-31 NOTE — Patient Instructions (Addendum)
Continue all other medications as before Please go to LAB in the Basement for the blood and/or urine tests to be done today Please call the phone number [phone] (the [employer] System) for results of testing in 2-3 days;  When calling, simply dial the number, and when prompted enter the MRN number above (the Medical Record Number) and the # key, then the message should start. Please call if pain not improved in 1 wk to consider abdomen ultrasound

## 2011-08-01 ENCOUNTER — Other Ambulatory Visit: Payer: Self-pay | Admitting: Internal Medicine

## 2011-08-01 LAB — BASIC METABOLIC PANEL
Calcium: 9.7 mg/dL (ref 8.4–10.5)
GFR: 72.5 mL/min (ref >60.00)
Potassium: 4.2 mEq/L (ref 3.5–5.1)
Sodium: 139 mEq/L (ref 135–145)

## 2011-08-01 LAB — LIPID PANEL: Cholesterol: 253 mg/dL — ABNORMAL HIGH (ref 0–200)

## 2011-08-01 LAB — TSH: TSH: 0.69 u[IU]/mL (ref 0.35–5.50)

## 2011-08-01 LAB — HEPATIC FUNCTION PANEL
ALT: 17 U/L (ref 0–35)
Albumin: 4.4 g/dL (ref 3.5–5.2)
Total Protein: 6.9 g/dL (ref 6.0–8.3)

## 2011-08-01 LAB — LIPASE: Lipase: 48 U/L (ref 11.0–59.0)

## 2011-08-01 MED ORDER — CEPHALEXIN 500 MG PO CAPS: 500.0000 mg | ORAL_CAPSULE | Freq: Four times a day (QID) | ORAL | Status: AC

## 2011-08-01 NOTE — Telephone Encounter (Signed)
rx done per emr 

## 2011-08-05 ENCOUNTER — Encounter: Payer: Self-pay | Admitting: Internal Medicine

## 2011-08-05 NOTE — Assessment & Plan Note (Signed)
stable overall by hx and exam, most recent data reviewed with pt, and pt to continue medical treatment as before  Lab Results  Component Value Date   HGBA1C 5.4 07/31/2011

## 2011-08-05 NOTE — Assessment & Plan Note (Signed)
stable overall by hx and exam, most recent data reviewed with pt, and pt to continue medical treatment as before  Lab Results  Component Value Date   TSH 0.69 07/31/2011

## 2011-08-05 NOTE — Assessment & Plan Note (Signed)
stable overall by hx and exam, most recent data reviewed with pt, and pt to continue medical treatment as before  Lab Results  Component Value Date   LDLCALC 86 07/06/2008

## 2011-08-10 ENCOUNTER — Encounter: Payer: Medicare Other | Admitting: *Deleted

## 2011-08-13 ENCOUNTER — Encounter: Payer: Self-pay | Admitting: *Deleted

## 2011-08-14 ENCOUNTER — Other Ambulatory Visit: Payer: Self-pay | Admitting: Internal Medicine

## 2011-08-28 ENCOUNTER — Encounter: Payer: Medicare Other | Admitting: Internal Medicine

## 2011-09-11 ENCOUNTER — Encounter: Payer: Self-pay | Admitting: Internal Medicine

## 2011-09-11 ENCOUNTER — Ambulatory Visit (INDEPENDENT_AMBULATORY_CARE_PROVIDER_SITE_OTHER): Payer: Medicare Other | Admitting: *Deleted

## 2011-09-11 ENCOUNTER — Other Ambulatory Visit: Payer: Self-pay | Admitting: Internal Medicine

## 2011-09-11 DIAGNOSIS — I442 Atrioventricular block, complete: Secondary | ICD-10-CM

## 2011-09-17 LAB — REMOTE PACEMAKER DEVICE
AL THRESHOLD: 1.625 V
ATRIAL PACING PM: 66
BAMS-0001: 175 {beats}/min
RV LEAD THRESHOLD: 1.375 V

## 2011-09-22 NOTE — Progress Notes (Signed)
Pacer remote check  

## 2011-10-10 ENCOUNTER — Encounter: Payer: Self-pay | Admitting: *Deleted

## 2011-10-31 ENCOUNTER — Encounter: Payer: Self-pay | Admitting: Internal Medicine

## 2011-10-31 ENCOUNTER — Ambulatory Visit (INDEPENDENT_AMBULATORY_CARE_PROVIDER_SITE_OTHER): Payer: Medicare Other | Admitting: Internal Medicine

## 2011-10-31 VITALS — BP 110/78 | HR 77 | Temp 98.0°F | Ht 63.0 in | Wt 136.1 lb

## 2011-10-31 DIAGNOSIS — Z0000 Encounter for general adult medical examination without abnormal findings: Secondary | ICD-10-CM

## 2011-10-31 DIAGNOSIS — Z23 Encounter for immunization: Secondary | ICD-10-CM

## 2011-10-31 DIAGNOSIS — I10 Essential (primary) hypertension: Secondary | ICD-10-CM

## 2011-10-31 DIAGNOSIS — F329 Major depressive disorder, single episode, unspecified: Secondary | ICD-10-CM

## 2011-10-31 DIAGNOSIS — K219 Gastro-esophageal reflux disease without esophagitis: Secondary | ICD-10-CM

## 2011-10-31 DIAGNOSIS — E785 Hyperlipidemia, unspecified: Secondary | ICD-10-CM

## 2011-10-31 MED ORDER — ENALAPRIL MALEATE 10 MG PO TABS: 10.0000 mg | ORAL_TABLET | Freq: Every day | ORAL | Status: DC

## 2011-10-31 MED ORDER — ZOLPIDEM TARTRATE 5 MG PO TABS: 5.0000 mg | ORAL_TABLET | Freq: Every evening | ORAL | Status: DC | PRN

## 2011-10-31 MED ORDER — OMEPRAZOLE 20 MG PO TBEC: 1.0000 | DELAYED_RELEASE_TABLET | Freq: Two times a day (BID) | ORAL | Status: DC

## 2011-10-31 MED ORDER — FEXOFENADINE HCL 180 MG PO TABS: 180.0000 mg | ORAL_TABLET | Freq: Every day | ORAL | Status: DC

## 2011-10-31 MED ORDER — FLUTICASONE PROPIONATE HFA 110 MCG/ACT IN AERO: 1.0000 | INHALATION_SPRAY | Freq: Two times a day (BID) | RESPIRATORY_TRACT | Status: DC

## 2011-10-31 MED ORDER — OMEPRAZOLE 20 MG PO TBEC: 1.0000 | DELAYED_RELEASE_TABLET | Freq: Every day | ORAL | Status: DC

## 2011-10-31 MED ORDER — AZELASTINE HCL 0.1 % NA SOLN: 1.0000 | Freq: Two times a day (BID) | NASAL | Status: DC

## 2011-10-31 MED ORDER — LEVOTHYROXINE SODIUM 112 MCG PO TABS: 112.0000 ug | ORAL_TABLET | Freq: Every day | ORAL | Status: DC

## 2011-10-31 MED ORDER — ATORVASTATIN CALCIUM 40 MG PO TABS: 40.0000 mg | ORAL_TABLET | Freq: Every day | ORAL | Status: DC

## 2011-10-31 MED ORDER — ATENOLOL 50 MG PO TABS: 50.0000 mg | ORAL_TABLET | Freq: Every day | ORAL | Status: DC

## 2011-10-31 NOTE — Patient Instructions (Addendum)
You had the flu shot today Continue all other medications as before, including the prilosec and the [employer] will be contacted regarding the referral for: colonoscopy Ok to stop the simvastatin Please start the lipitor at 40 mg per day All of your medications were sent to pharmacy Please return in 2 months

## 2011-11-05 ENCOUNTER — Encounter: Payer: Self-pay | Admitting: Internal Medicine

## 2011-11-05 NOTE — Assessment & Plan Note (Signed)
stable overall by hx and exam, most recent data reviewed with pt, and pt to continue medical treatment as before  Lab Results  Component Value Date   WBC 5.5 07/31/2011   HGB 12.8 07/31/2011   HCT 37.7 07/31/2011   PLT 154.0 07/31/2011   GLUCOSE 128* 07/31/2011   CHOL 253* 07/31/2011   TRIG 170.0* 07/31/2011   HDL 102.00 07/31/2011   LDLDIRECT 136.1 07/31/2011   LDLCALC 86 07/06/2008   ALT 17 07/31/2011   AST 22 07/31/2011   NA 139 07/31/2011   K 4.2 07/31/2011   CL 105 07/31/2011   CREATININE 0.8 07/31/2011   BUN 28* 07/31/2011   CO2 27 07/31/2011   TSH 0.69 07/31/2011   HGBA1C 5.4 07/31/2011

## 2011-11-05 NOTE — Progress Notes (Signed)
Subjective:    Patient ID: Danielle [name], female    DOB: 11/27/1935, 75 y.o.   MRN: [id number]  HPI  Here to f/u; overall doing ok,  Pt denies chest pain, increased sob or doe, wheezing, orthopnea, PND, increased LE swelling, palpitations, dizziness or syncope.  Pt denies new neurological symptoms such as new headache, or facial or extremity weakness or numbness   Pt denies polydipsia, polyuria.  Pt states overall good compliance with meds, trying to follow lower cholesterol diet, wt overall stable but little exercise however.  Denies hyper or hypo thyroid symptoms such as voice, skin or hair change.  Denies worsening reflux, dysphagia, abd pain, n/v, bowel change or blood except when does not take the PPI.   Pt denies fever, wt loss, night sweats, loss of appetite, or other constitutional symptoms  No other new complaints.  Had recent renal stone removed per urology. Denies worsening depressive symptoms, suicidal ideation, or panic Past Medical History  Diagnosis Date  . NEPHROLITHIASIS, HX OF 01/09/2008  . DVT, HX OF 01/09/2008  . INSOMNIA-SLEEP DISORDER-UNSPEC 06/15/2009  . Dizziness and giddiness 02/25/2010  . OSTEOARTHRITIS, CERVICAL SPINE 02/25/2010  . GERD 01/09/2008  . ALLERGIC RHINITIS 01/09/2008  . ASTHMATIC BRONCHITIS, ACUTE 10/20/2008  . AV BLOCK, COMPLETE 12/16/2009  . HYPERTENSION 01/09/2008  . DEPRESSION 01/09/2008  . ANXIETY 01/09/2008  . ANEMIA-NOS 01/09/2008  . HYPERLIPIDEMIA 01/09/2008  . GLUCOSE INTOLERANCE 01/09/2008  . HYPOTHYROIDISM 01/09/2008  . Impaired glucose tolerance 07/28/2011   Past Surgical History  Procedure Date  . Pacemaker placement     MedTronic EnPulse [id number]  . Ercp w/ sphicterotomy   . Hemorrhoid surgery   . Polypectomy     Uterine    reports that she has never smoked. She does not have any smokeless tobacco history on file. She reports that she drinks alcohol. Her drug history not on file. family history includes Cancer in her mother; Pulmonary  embolism in her father; and Sudden death in her mother. No Known Allergies Current Outpatient Prescriptions on File Prior to Visit  Medication Sig Dispense Refill  . aspirin 81 MG EC tablet Take 81 mg by mouth daily.         Review of Systems Review of Systems  Constitutional: Negative for diaphoresis and unexpected weight change.  HENT: Negative for drooling and tinnitus.   Eyes: Negative for photophobia and visual disturbance.  Respiratory: Negative for choking and stridor.   Gastrointestinal: Negative for vomiting and blood in stool.  Genitourinary: Negative for hematuria and decreased urine volume.  Musculoskeletal: Negative for gait problem.  Skin: Negative for color change and wound.  Neurological: Negative for tremors and numbness.  Psychiatric/Behavioral: Negative for decreased concentration. The patient is not hyperactive.       Objective:   Physical Exam BP 110/78  Pulse 77  Temp(Src) 98 F (36.7 C) (Oral)  Ht 5\' 3"  (1.6 m)  Wt 136 lb 2 oz (61.746 kg)  BMI 24.11 kg/m2  SpO2 98% Physical Exam  VS noted Constitutional: Pt appears well-developed and well-nourished.  HENT: Head: Normocephalic.  Right Ear: External ear normal.  Left Ear: External ear normal.  Eyes: Conjunctivae and EOM are normal. Pupils are equal, round, and reactive to light.  Neck: Normal range of motion. Neck supple.  Cardiovascular: Normal rate and regular rhythm.   Pulmonary/Chest: Effort normal and breath sounds normal.  Abd:  Soft, NT, non-distended, + BS Neurological: Pt is alert. No cranial nerve deficit.  Skin: Skin is warm. No  erythema.  Psychiatric: Pt behavior is normal. Thought content normal. not depressed affect    Assessment & Plan:

## 2011-11-05 NOTE — Assessment & Plan Note (Signed)
stable overall by hx and exam,, and pt to continue medical treatment as before, to cont PPI,  to f/u any worsening symptoms or concerns

## 2011-11-05 NOTE — Assessment & Plan Note (Signed)
stable overall by hx and exam, most recent data reviewed with pt, and pt to continue medical treatment as before BP Readings from Last 3 Encounters:  10/31/11 110/78  07/31/11 124/80  01/11/11 145/86

## 2011-11-05 NOTE — Assessment & Plan Note (Addendum)
stable overall by hx and exam, most recent data reviewed with pt, and pt to continue medical treatment as before except change the simvastatin to lipitor for beter tolerability and efficacy  Lab Results  Component Value Date   LDLCALC 86 07/06/2008

## 2012-01-03 ENCOUNTER — Ambulatory Visit: Payer: Medicare Other | Admitting: Internal Medicine

## 2012-01-09 ENCOUNTER — Other Ambulatory Visit: Payer: Self-pay | Admitting: Internal Medicine

## 2012-01-09 NOTE — Telephone Encounter (Signed)
Faxed hardcopy to pharmacy. 

## 2012-01-09 NOTE — Telephone Encounter (Signed)
Done hardcopy to robin  

## 2012-01-10 ENCOUNTER — Other Ambulatory Visit: Payer: Self-pay | Admitting: Dermatology

## 2012-01-10 DIAGNOSIS — L570 Actinic keratosis: Secondary | ICD-10-CM | POA: Diagnosis not present

## 2012-01-10 DIAGNOSIS — L538 Other specified erythematous conditions: Secondary | ICD-10-CM | POA: Diagnosis not present

## 2012-01-19 DIAGNOSIS — L538 Other specified erythematous conditions: Secondary | ICD-10-CM | POA: Diagnosis not present

## 2012-01-23 ENCOUNTER — Encounter: Payer: Self-pay | Admitting: Internal Medicine

## 2012-01-23 ENCOUNTER — Ambulatory Visit (INDEPENDENT_AMBULATORY_CARE_PROVIDER_SITE_OTHER): Payer: Medicare Other | Admitting: Internal Medicine

## 2012-01-23 VITALS — BP 128/70 | HR 80 | Resp 18 | Ht 63.0 in | Wt 137.4 lb

## 2012-01-23 DIAGNOSIS — I442 Atrioventricular block, complete: Secondary | ICD-10-CM | POA: Diagnosis not present

## 2012-01-23 DIAGNOSIS — Z950 Presence of cardiac pacemaker: Secondary | ICD-10-CM

## 2012-01-23 HISTORY — DX: Presence of cardiac pacemaker: Z95.0

## 2012-01-23 LAB — PACEMAKER DEVICE OBSERVATION
AL AMPLITUDE: 1 mv
AL THRESHOLD: 1.5 V
RV LEAD THRESHOLD: 1.5 V

## 2012-01-23 NOTE — Patient Instructions (Signed)

## 2012-01-23 NOTE — Assessment & Plan Note (Signed)
The patient's device was interrogated.  The information was reviewed. No changes were made in the programming.    

## 2012-01-23 NOTE — Assessment & Plan Note (Signed)
Stable  Pacer approaching eri

## 2012-01-23 NOTE — Progress Notes (Signed)
  HPI  Danielle [name] is a 76 y.o. female is seen in followup for complete heart block for which she is status post pacemaker. She continues to approach ERI and is being followed transtelophonic monitoring.  The patient denies SOB, chest pain, edema or palpitations    Past Medical History  Diagnosis Date  . NEPHROLITHIASIS, HX OF 01/09/2008  . DVT, HX OF 01/09/2008  . INSOMNIA-SLEEP DISORDER-UNSPEC 06/15/2009  . Dizziness and giddiness 02/25/2010  . OSTEOARTHRITIS, CERVICAL SPINE 02/25/2010  . GERD 01/09/2008  . ALLERGIC RHINITIS 01/09/2008  . ASTHMATIC BRONCHITIS, ACUTE 10/20/2008  . AV BLOCK, COMPLETE 12/16/2009  . HYPERTENSION 01/09/2008  . DEPRESSION 01/09/2008  . ANXIETY 01/09/2008  . ANEMIA-NOS 01/09/2008  . HYPERLIPIDEMIA 01/09/2008  . GLUCOSE INTOLERANCE 01/09/2008  . HYPOTHYROIDISM 01/09/2008  . Impaired glucose tolerance 07/28/2011    Past Surgical History  Procedure Date  . Pacemaker placement     MedTronic EnPulse [id number]  . Ercp w/ sphicterotomy   . Hemorrhoid surgery   . Polypectomy     Uterine    Current Outpatient Prescriptions  Medication Sig Dispense Refill  . aspirin 81 MG EC tablet Take 81 mg by mouth daily.        Marland Kitchen atenolol (TENORMIN) 50 MG tablet Take 1 tablet (50 mg total) by mouth daily.  90 tablet  3  . atorvastatin (LIPITOR) 40 MG tablet Take 1 tablet (40 mg total) by mouth daily.  90 tablet  3  . azelastine (ASTELIN) 137 MCG/SPRAY nasal spray Place 1 spray into the nose 2 (two) times daily. Use in each nostril as directed  30 mL  5  . enalapril (VASOTEC) 10 MG tablet Take 1 tablet (10 mg total) by mouth daily.  90 tablet  3  . fluticasone (FLOVENT HFA) 110 MCG/ACT inhaler Inhale 1 puff into the lungs 2 (two) times daily.  3 Inhaler  3  . levothyroxine (LEVOXYL) 112 MCG tablet Take 1 tablet (112 mcg total) by mouth daily.  90 tablet  3  . Omeprazole 20 MG TBEC Take 1 tablet (20 mg total) by mouth daily.  90 each  3  . zolpidem (AMBIEN CR) 12.5 MG CR  tablet take 1 tablet by mouth at bedtime if needed  30 tablet  5  . fexofenadine (ALLEGRA) 180 MG tablet Take 1 tablet (180 mg total) by mouth daily.  90 tablet  3    Allergies  Allergen Reactions  . Codeine Nausea And Vomiting    Review of Systems negative except from HPI and PMH  Physical Exam BP 128/70  Pulse 80  Resp 18  Ht 5\' 3"  (1.6 m)  Wt 137 lb 6.4 oz (62.324 kg)  BMI 24.34 kg/m2 Well developed and well nourished in no acute distress HENT normal E scleral and icterus clear Neck Supple JVP flat; carotids brisk and full Clear to ausculation Regular rate and rhythm, no murmurs gallops or rub Soft with active bowel sounds No clubbing cyanosis none Edema Alert and oriented, grossly normal motor and sensory function Skin Warm and Dry   Assessment and  Plan

## 2012-02-12 ENCOUNTER — Encounter: Payer: Self-pay | Admitting: Gastroenterology

## 2012-02-22 ENCOUNTER — Encounter: Payer: Self-pay | Admitting: Internal Medicine

## 2012-02-22 ENCOUNTER — Ambulatory Visit (INDEPENDENT_AMBULATORY_CARE_PROVIDER_SITE_OTHER): Payer: Medicare Other | Admitting: *Deleted

## 2012-02-22 DIAGNOSIS — I442 Atrioventricular block, complete: Secondary | ICD-10-CM

## 2012-02-23 LAB — REMOTE PACEMAKER DEVICE
AL AMPLITUDE: 2.8 mv
AL IMPEDENCE PM: 698 Ohm
AL THRESHOLD: 2 V
BATTERY VOLTAGE: 2.62 V
VENTRICULAR PACING PM: 100

## 2012-03-01 ENCOUNTER — Telehealth: Payer: Self-pay | Admitting: Internal Medicine

## 2012-03-01 ENCOUNTER — Encounter: Payer: Self-pay | Admitting: *Deleted

## 2012-03-01 DIAGNOSIS — L538 Other specified erythematous conditions: Secondary | ICD-10-CM | POA: Diagnosis not present

## 2012-03-01 NOTE — Telephone Encounter (Signed)
Error

## 2012-03-04 ENCOUNTER — Telehealth: Payer: Self-pay

## 2012-03-04 NOTE — Telephone Encounter (Signed)
Pharmacy informed.

## 2012-03-04 NOTE — Telephone Encounter (Signed)
Levothyroxine(levoxyl) 112 mcg has been voluntarily recalled by the manufacturer. Please change to brand or okay to dispense generic.

## 2012-03-04 NOTE — Telephone Encounter (Signed)
Ok to dispense to generic

## 2012-03-05 ENCOUNTER — Telehealth: Payer: Self-pay | Admitting: Cardiology

## 2012-03-06 NOTE — Telephone Encounter (Signed)
Error

## 2012-03-07 NOTE — Progress Notes (Signed)
Remote pacer check  

## 2012-03-26 ENCOUNTER — Ambulatory Visit (INDEPENDENT_AMBULATORY_CARE_PROVIDER_SITE_OTHER): Payer: Medicare Other | Admitting: Internal Medicine

## 2012-03-26 ENCOUNTER — Encounter: Payer: Self-pay | Admitting: Internal Medicine

## 2012-03-26 VITALS — BP 120/82 | HR 78 | Temp 98.0°F | Ht 64.0 in | Wt 133.2 lb

## 2012-03-26 DIAGNOSIS — E785 Hyperlipidemia, unspecified: Secondary | ICD-10-CM

## 2012-03-26 DIAGNOSIS — R21 Rash and other nonspecific skin eruption: Secondary | ICD-10-CM | POA: Diagnosis not present

## 2012-03-26 DIAGNOSIS — I10 Essential (primary) hypertension: Secondary | ICD-10-CM

## 2012-03-26 MED ORDER — DOXYCYCLINE HYCLATE 100 MG PO TABS: 100.0000 mg | ORAL_TABLET | Freq: Two times a day (BID) | ORAL | Status: AC

## 2012-03-26 NOTE — Assessment & Plan Note (Signed)
Doubt lyme dz which is pt main concern without erythema chronica migrans, but  Cant r/o early cellulitis related to insect tick bite, or even risk for RMSF - for doxy course,  to f/u any worsening symptoms or concerns

## 2012-03-26 NOTE — Assessment & Plan Note (Signed)
Uncontrolled last viist with LDL 136 sept 2012, declines labs today, to cont lower chol diet and statin, f/u labs with next visit

## 2012-03-26 NOTE — Assessment & Plan Note (Signed)
stable overall by hx and exam, most recent data reviewed with pt, and pt to continue medical treatment as before BP Readings from Last 3 Encounters:  03/26/12 120/82  01/23/12 128/70  10/31/11 110/78

## 2012-03-26 NOTE — Patient Instructions (Addendum)
Take all new medications as prescribed Continue all other medications as before  

## 2012-03-26 NOTE — Progress Notes (Signed)
Subjective:    Patient ID: Danielle [name], female    DOB: 1935/12/28, 76 y.o.   MRN: [id number]  HPI  Here to f/u;  Did have tick to LL[place] abd after [place], with rash around the bite after the tick removed, itched quite a bit, not sure about central clearing of the rash, rash nontender, no fever, chills, myalgias, joint pain (worse than usual);  Pt denies chest pain, increased sob or doe, wheezing, orthopnea, PND, increased LE swelling, palpitations, dizziness or syncope.  Pt denies new neurological symptoms such as new headache, or facial or extremity weakness or numbness   Pt denies polydipsia, polyuria.   Pt denies fever, wt loss, night sweats, loss of appetite, or other constitutional symptoms Past Medical History  Diagnosis Date  . NEPHROLITHIASIS, HX OF 01/09/2008  . DVT, HX OF 01/09/2008  . INSOMNIA-SLEEP DISORDER-UNSPEC 06/15/2009  . Dizziness and giddiness 02/25/2010  . OSTEOARTHRITIS, CERVICAL SPINE 02/25/2010  . GERD 01/09/2008  . ALLERGIC RHINITIS 01/09/2008  . ASTHMATIC BRONCHITIS, ACUTE 10/20/2008  . AV BLOCK, COMPLETE 12/16/2009  . HYPERTENSION 01/09/2008  . DEPRESSION 01/09/2008  . ANXIETY 01/09/2008  . ANEMIA-NOS 01/09/2008  . HYPERLIPIDEMIA 01/09/2008  . GLUCOSE INTOLERANCE 01/09/2008  . HYPOTHYROIDISM 01/09/2008  . Impaired glucose tolerance 07/28/2011   Past Surgical History  Procedure Date  . Pacemaker placement     MedTronic EnPulse [id number]  . Ercp w/ sphicterotomy   . Hemorrhoid surgery   . Polypectomy     Uterine    reports that she has never smoked. She does not have any smokeless tobacco history on file. She reports that she drinks alcohol. Her drug history not on file. family history includes Cancer in her mother; Pulmonary embolism in her father; and Sudden death in her mother. Allergies  Allergen Reactions  . Codeine Nausea And Vomiting   Current Outpatient Prescriptions on File Prior to Visit  Medication Sig Dispense Refill    . aspirin 81 MG EC tablet Take 81 mg by mouth daily.        Marland Kitchen atenolol (TENORMIN) 50 MG tablet Take 1 tablet (50 mg total) by mouth daily.  90 tablet  3  . atorvastatin (LIPITOR) 40 MG tablet Take 1 tablet (40 mg total) by mouth daily.  90 tablet  3  . azelastine (ASTELIN) 137 MCG/SPRAY nasal spray Place 1 spray into the nose 2 (two) times daily. Use in each nostril as directed  30 mL  5  . enalapril (VASOTEC) 10 MG tablet Take 1 tablet (10 mg total) by mouth daily.  90 tablet  3  . fexofenadine (ALLEGRA) 180 MG tablet Take 1 tablet (180 mg total) by mouth daily.  90 tablet  3  . fluticasone (FLOVENT HFA) 110 MCG/ACT inhaler Inhale 1 puff into the lungs 2 (two) times daily.  3 Inhaler  3  . levothyroxine (LEVOXYL) 112 MCG tablet Take 1 tablet (112 mcg total) by mouth daily.  90 tablet  3  . Omeprazole 20 MG TBEC Take 1 tablet (20 mg total) by mouth daily.  90 each  3  . zolpidem (AMBIEN CR) 12.5 MG CR tablet take 1 tablet by mouth at bedtime if needed  30 tablet  5   Review of Systems Review of Systems  Constitutional: Negative for diaphoresis and unexpected weight change.  HENT: Negative for tinnitus.   Eyes: Negative for photophobia and visual disturbance.   Gastrointestinal: Negative for vomiting and blood in stool.  Genitourinary:  Negative for hematuria and decreased urine volume.  Musculoskeletal: Negative for gait problem. .  Neurological: Negative for tremors and numbness.  Objective:   Physical Exam BP 120/82  Pulse 78  Temp 98 F (36.7 C)  Ht 5\' 4"  (1.626 m)  Wt 133 lb 4 oz (60.442 kg)  BMI 22.87 kg/m2  SpO2 93% Physical Exam  VS noted Constitutional: Pt appears well-developed and well-nourished.  HENT: Head: Normocephalic.  Right Ear: External ear normal.  Left Ear: External ear normal.  Eyes: Conjunctivae and EOM are normal..  Neck: Normal range of motion. Neck supple.  Cardiovascular: Normal rate and regular rhythm.   Pulmonary/Chest: Effort normal and breath  sounds normal.  Abd:  Soft, NT, non-distended, + BS Neurological: Pt is alert. Not confused Skin: large 6 cm area rash to left inguinal area, nontedner nonraised no ulcer but + few left inguinal LA, tender Psychiatric: Pt behavior is normal. Thought content normal.     Assessment & Plan:

## 2012-03-28 ENCOUNTER — Encounter: Payer: Medicare Other | Admitting: *Deleted

## 2012-03-28 ENCOUNTER — Telehealth: Payer: Self-pay | Admitting: Internal Medicine

## 2012-03-28 NOTE — Telephone Encounter (Signed)
Patient request return call regarding transmission, she can be reached at [phone].

## 2012-03-29 NOTE — Telephone Encounter (Signed)
Left message for patient.  We did not receive her transmission that was to be sent 03/28/12.  Last transmission sent in April battery status ok.  I reminded her to send today.

## 2012-04-03 ENCOUNTER — Encounter: Payer: Self-pay | Admitting: Internal Medicine

## 2012-04-03 ENCOUNTER — Ambulatory Visit (INDEPENDENT_AMBULATORY_CARE_PROVIDER_SITE_OTHER): Payer: Medicare Other | Admitting: *Deleted

## 2012-04-03 DIAGNOSIS — I442 Atrioventricular block, complete: Secondary | ICD-10-CM

## 2012-04-05 ENCOUNTER — Encounter: Payer: Self-pay | Admitting: *Deleted

## 2012-04-08 LAB — REMOTE PACEMAKER DEVICE
BAMS-0001: 175 {beats}/min
BATTERY VOLTAGE: 2.61 V

## 2012-04-22 ENCOUNTER — Encounter: Payer: Self-pay | Admitting: *Deleted

## 2012-04-26 ENCOUNTER — Telehealth: Payer: Self-pay

## 2012-04-26 MED ORDER — LEVOTHYROXINE SODIUM 112 MCG PO TABS: 112.0000 ug | ORAL_TABLET | Freq: Every day | ORAL | Status: DC

## 2012-04-26 NOTE — Telephone Encounter (Signed)
Pt called stating that pharmacy is requesting alternate therapy for Levoxyl since it was taken off the market. Please send medication to [facility].

## 2012-04-26 NOTE — Telephone Encounter (Signed)
Done per emr 

## 2012-05-09 ENCOUNTER — Encounter: Payer: Medicare Other | Admitting: *Deleted

## 2012-05-15 DIAGNOSIS — R42 Dizziness and giddiness: Secondary | ICD-10-CM | POA: Diagnosis not present

## 2012-05-16 ENCOUNTER — Ambulatory Visit (INDEPENDENT_AMBULATORY_CARE_PROVIDER_SITE_OTHER): Payer: Medicare Other | Admitting: *Deleted

## 2012-05-16 ENCOUNTER — Encounter: Payer: Self-pay | Admitting: Internal Medicine

## 2012-05-16 DIAGNOSIS — I442 Atrioventricular block, complete: Secondary | ICD-10-CM

## 2012-05-20 ENCOUNTER — Encounter: Payer: Self-pay | Admitting: *Deleted

## 2012-05-20 LAB — REMOTE PACEMAKER DEVICE
AL IMPEDENCE PM: 749 Ohm
ATRIAL PACING PM: 62
BATTERY VOLTAGE: 2.59 V
RV LEAD IMPEDENCE PM: 912 Ohm

## 2012-05-28 ENCOUNTER — Telehealth: Payer: Self-pay | Admitting: Internal Medicine

## 2012-05-28 NOTE — Telephone Encounter (Signed)
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  She will call [employer] and get a return carton and see [name] in office tomorrow @ 11am for a battery check.

## 2012-05-28 NOTE — Telephone Encounter (Signed)
Please return call to patient  At [phone] regarding broken device monitor box.

## 2012-05-29 ENCOUNTER — Ambulatory Visit (INDEPENDENT_AMBULATORY_CARE_PROVIDER_SITE_OTHER): Payer: Medicare Other | Admitting: *Deleted

## 2012-05-29 ENCOUNTER — Encounter: Payer: Self-pay | Admitting: Internal Medicine

## 2012-05-29 DIAGNOSIS — I442 Atrioventricular block, complete: Secondary | ICD-10-CM | POA: Diagnosis not present

## 2012-05-29 NOTE — Progress Notes (Signed)
PPM check 

## 2012-05-31 LAB — PACEMAKER DEVICE OBSERVATION
ATRIAL PACING PM: 62
BRDY-0002RA: 60 {beats}/min
BRDY-0003RA: 130 {beats}/min
BRDY-0004RA: 130 {beats}/min

## 2012-06-03 ENCOUNTER — Ambulatory Visit: Payer: Medicare Other | Attending: Neurology | Admitting: Physical Therapy

## 2012-06-03 DIAGNOSIS — H8110 Benign paroxysmal vertigo, unspecified ear: Secondary | ICD-10-CM | POA: Insufficient documentation

## 2012-06-03 DIAGNOSIS — R269 Unspecified abnormalities of gait and mobility: Secondary | ICD-10-CM | POA: Insufficient documentation

## 2012-06-03 DIAGNOSIS — IMO0001 Reserved for inherently not codable concepts without codable children: Secondary | ICD-10-CM | POA: Diagnosis not present

## 2012-06-05 DIAGNOSIS — H35039 Hypertensive retinopathy, unspecified eye: Secondary | ICD-10-CM | POA: Diagnosis not present

## 2012-06-05 DIAGNOSIS — H43399 Other vitreous opacities, unspecified eye: Secondary | ICD-10-CM | POA: Diagnosis not present

## 2012-06-05 DIAGNOSIS — H40019 Open angle with borderline findings, low risk, unspecified eye: Secondary | ICD-10-CM | POA: Diagnosis not present

## 2012-06-05 DIAGNOSIS — H2510 Age-related nuclear cataract, unspecified eye: Secondary | ICD-10-CM | POA: Diagnosis not present

## 2012-06-10 ENCOUNTER — Encounter: Payer: Medicare Other | Admitting: Physical Therapy

## 2012-06-12 ENCOUNTER — Ambulatory Visit: Payer: Medicare Other | Admitting: Physical Therapy

## 2012-06-12 DIAGNOSIS — H8110 Benign paroxysmal vertigo, unspecified ear: Secondary | ICD-10-CM | POA: Diagnosis not present

## 2012-06-12 DIAGNOSIS — IMO0001 Reserved for inherently not codable concepts without codable children: Secondary | ICD-10-CM | POA: Diagnosis not present

## 2012-06-12 DIAGNOSIS — R269 Unspecified abnormalities of gait and mobility: Secondary | ICD-10-CM | POA: Diagnosis not present

## 2012-06-20 DIAGNOSIS — R300 Dysuria: Secondary | ICD-10-CM | POA: Diagnosis not present

## 2012-06-20 DIAGNOSIS — Z87442 Personal history of urinary calculi: Secondary | ICD-10-CM | POA: Diagnosis not present

## 2012-06-20 DIAGNOSIS — R3989 Other symptoms and signs involving the genitourinary system: Secondary | ICD-10-CM | POA: Diagnosis not present

## 2012-07-04 ENCOUNTER — Encounter: Payer: Self-pay | Admitting: Internal Medicine

## 2012-07-04 ENCOUNTER — Ambulatory Visit (INDEPENDENT_AMBULATORY_CARE_PROVIDER_SITE_OTHER): Payer: Medicare Other | Admitting: *Deleted

## 2012-07-04 DIAGNOSIS — I442 Atrioventricular block, complete: Secondary | ICD-10-CM

## 2012-07-04 LAB — PACEMAKER DEVICE OBSERVATION
AL AMPLITUDE: 2.8 mv
ATRIAL PACING PM: 70
BAMS-0001: 175 {beats}/min
VENTRICULAR PACING PM: 100

## 2012-07-04 NOTE — Progress Notes (Signed)
Pacer check in clinic for battery

## 2012-07-15 ENCOUNTER — Telehealth: Payer: Self-pay | Admitting: Internal Medicine

## 2012-07-15 ENCOUNTER — Ambulatory Visit (INDEPENDENT_AMBULATORY_CARE_PROVIDER_SITE_OTHER): Payer: Medicare Other | Admitting: Internal Medicine

## 2012-07-15 ENCOUNTER — Encounter: Payer: Self-pay | Admitting: *Deleted

## 2012-07-15 ENCOUNTER — Encounter: Payer: Self-pay | Admitting: Internal Medicine

## 2012-07-15 ENCOUNTER — Encounter (HOSPITAL_COMMUNITY): Payer: Self-pay | Admitting: Pharmacy Technician

## 2012-07-15 ENCOUNTER — Ambulatory Visit (INDEPENDENT_AMBULATORY_CARE_PROVIDER_SITE_OTHER): Payer: Medicare Other | Admitting: *Deleted

## 2012-07-15 VITALS — BP 80/58 | HR 66 | Ht 64.0 in | Wt 136.0 lb

## 2012-07-15 DIAGNOSIS — R233 Spontaneous ecchymoses: Secondary | ICD-10-CM

## 2012-07-15 DIAGNOSIS — I442 Atrioventricular block, complete: Secondary | ICD-10-CM | POA: Diagnosis not present

## 2012-07-15 DIAGNOSIS — R42 Dizziness and giddiness: Secondary | ICD-10-CM

## 2012-07-15 DIAGNOSIS — Z01810 Encounter for preprocedural cardiovascular examination: Secondary | ICD-10-CM | POA: Diagnosis not present

## 2012-07-15 DIAGNOSIS — Z950 Presence of cardiac pacemaker: Secondary | ICD-10-CM

## 2012-07-15 LAB — PACEMAKER DEVICE OBSERVATION: RV LEAD IMPEDENCE PM: 874 Ohm

## 2012-07-15 LAB — BASIC METABOLIC PANEL WITH GFR
BUN: 22 mg/dL (ref 6–23)
CO2: 24 mEq/L (ref 19–32)
Chloride: 106 mEq/L (ref 96–112)
GFR, Est African American: 66 mL/min
Glucose, Bld: 59 mg/dL — ABNORMAL LOW (ref 70–99)
Potassium: 3.9 mEq/L (ref 3.5–5.3)
Sodium: 139 mEq/L (ref 135–145)

## 2012-07-15 NOTE — Telephone Encounter (Signed)
Patient seen in office today. 

## 2012-07-15 NOTE — Addendum Note (Signed)
Addended by: Freddi Starr on: 07/15/2012 05:18 PM   Modules accepted: Orders

## 2012-07-15 NOTE — Assessment & Plan Note (Signed)
She has reverted to VVI with evident pacemaker syndrome.  We will undertake generator replacement in am.  .The benefits and risks were reviewed including but not limited to death,  perforation, infection, lead dislodgement and device malfunction.  The patient understands agrees and is willing to proceed.  We iwll inquire as to the availability of the aegis antimicrobial pouch  She will hold her atenolol and enalapril in am 2/2 hyptoension

## 2012-07-15 NOTE — Telephone Encounter (Signed)
Pt calling heart rate 62 at 1140am this am, some SOB , some dizziness

## 2012-07-15 NOTE — Assessment & Plan Note (Signed)
The patient's device was interrogated.  The information was reviewed. No changes were made in the programming.   As above  

## 2012-07-15 NOTE — Telephone Encounter (Signed)
Pt calls to speak with Gunnar Fusi.  Her heart rate was 64 yesterday Her heart rate today is 62.  Pt aware Gunnar Fusi will return call. Mylo Red RN

## 2012-07-15 NOTE — Progress Notes (Signed)
  HPI  Danielle [name] is a 76 y.o. female is seen in followup for complete heart block for which she is status post pacemaker. She continues to approach ERI and is being followed transtelophonic monitoring.    Over the last few days she has noted a fixed rate at 65 and LH and Presyncope with some sob  NO chest pain  This is her 4th procedure, initial implant followed by extraction in 1997 and generator replacement 2006  Past Medical History  Diagnosis Date  . NEPHROLITHIASIS, HX OF 01/09/2008  . DVT, HX OF 01/09/2008  . INSOMNIA-SLEEP DISORDER-UNSPEC 06/15/2009  . Dizziness and giddiness 02/25/2010  . OSTEOARTHRITIS, CERVICAL SPINE 02/25/2010  . GERD 01/09/2008  . ALLERGIC RHINITIS 01/09/2008  . ASTHMATIC BRONCHITIS, ACUTE 10/20/2008  . AV BLOCK, COMPLETE 12/16/2009  . HYPERTENSION 01/09/2008  . DEPRESSION 01/09/2008  . ANXIETY 01/09/2008  . ANEMIA-NOS 01/09/2008  . HYPERLIPIDEMIA 01/09/2008  . GLUCOSE INTOLERANCE 01/09/2008  . HYPOTHYROIDISM 01/09/2008  . Impaired glucose tolerance 07/28/2011    Past Surgical History  Procedure Date  . Pacemaker placement     MedTronic EnPulse [id number]  . Ercp w/ sphicterotomy   . Hemorrhoid surgery   . Polypectomy     Uterine    Current Outpatient Prescriptions  Medication Sig Dispense Refill  . aspirin 81 MG EC tablet Take 81 mg by mouth daily.        . atenolol (TENORMIN) 50 MG tablet Take 1 tablet (50 mg total) by mouth daily.  90 tablet  3  . atorvastatin (LIPITOR) 40 MG tablet Take 1 tablet (40 mg total) by mouth daily.  90 tablet  3  . azelastine (ASTELIN) 137 MCG/SPRAY nasal spray Place 1 spray into the nose 2 (two) times daily. Use in each nostril as directed  30 mL  5  . enalapril (VASOTEC) 10 MG tablet Take 1 tablet (10 mg total) by mouth daily.  90 tablet  3  . levothyroxine (SYNTHROID, LEVOTHROID) 112 MCG tablet Take 1 tablet (112 mcg total) by mouth daily.  90 tablet  3  . Omeprazole 20 MG TBEC Take 1 tablet (20 mg total) by mouth  daily.  90 each  3  . phenazopyridine (PYRIDIUM) 200 MG tablet Take 200 mg by mouth daily.       . zolpidem (AMBIEN CR) 12.5 MG CR tablet take 1 tablet by mouth at bedtime if needed  30 tablet  5    Allergies  Allergen Reactions  . Codeine Nausea And Vomiting    Review of Systems negative except from HPI and PMH  Physical Exam BP 80/58  Pulse 66  Ht 5' 4" (1.626 m)  Wt 136 lb (61.689 kg)  BMI 23.34 kg/m2  SpO2 98% Well developed and well nourished in no acute distress HENT normal E scleral and icterus clear Neck Supple JVP flat; carotids brisk and full Clear to ausculation Device pocket well healed; without hematoma or erythema Regular rate and rhythm, Soft with active bowel sounds No clubbing cyanosis no Edema Alert and oriented, grossly normal motor and sensory function Skin Warm and Dry    Assessment and  Plan  

## 2012-07-15 NOTE — Assessment & Plan Note (Signed)
As above.

## 2012-07-16 ENCOUNTER — Encounter (HOSPITAL_COMMUNITY)
Admission: RE | Disposition: A | Discharge status: Home / Self Care | Payer: Self-pay | Source: Ambulatory Visit | Attending: Internal Medicine

## 2012-07-16 ENCOUNTER — Ambulatory Visit (HOSPITAL_COMMUNITY)
Admission: RE | Admit: 2012-07-16 | Discharge: 2012-07-16 | Disposition: A | Discharge status: Home / Self Care | Payer: Medicare Other | Source: Ambulatory Visit | Attending: Internal Medicine | Admitting: Internal Medicine

## 2012-07-16 DIAGNOSIS — Z79899 Other long term (current) drug therapy: Secondary | ICD-10-CM | POA: Insufficient documentation

## 2012-07-16 DIAGNOSIS — E785 Hyperlipidemia, unspecified: Secondary | ICD-10-CM | POA: Diagnosis not present

## 2012-07-16 DIAGNOSIS — I442 Atrioventricular block, complete: Secondary | ICD-10-CM | POA: Insufficient documentation

## 2012-07-16 DIAGNOSIS — Z01810 Encounter for preprocedural cardiovascular examination: Secondary | ICD-10-CM

## 2012-07-16 DIAGNOSIS — Z950 Presence of cardiac pacemaker: Secondary | ICD-10-CM

## 2012-07-16 DIAGNOSIS — R7309 Other abnormal glucose: Secondary | ICD-10-CM | POA: Diagnosis not present

## 2012-07-16 DIAGNOSIS — R233 Spontaneous ecchymoses: Secondary | ICD-10-CM

## 2012-07-16 DIAGNOSIS — Z86718 Personal history of other venous thrombosis and embolism: Secondary | ICD-10-CM | POA: Diagnosis not present

## 2012-07-16 DIAGNOSIS — Z7902 Long term (current) use of antithrombotics/antiplatelets: Secondary | ICD-10-CM | POA: Insufficient documentation

## 2012-07-16 DIAGNOSIS — R42 Dizziness and giddiness: Secondary | ICD-10-CM

## 2012-07-16 DIAGNOSIS — Z7982 Long term (current) use of aspirin: Secondary | ICD-10-CM | POA: Diagnosis not present

## 2012-07-16 DIAGNOSIS — Z45018 Encounter for adjustment and management of other part of cardiac pacemaker: Secondary | ICD-10-CM | POA: Insufficient documentation

## 2012-07-16 HISTORY — PX: PERMANENT PACEMAKER GENERATOR CHANGE: SHX6022

## 2012-07-16 LAB — CBC
HCT: 35.8 % — ABNORMAL LOW (ref 36.0–46.0)
MCH: 30.5 pg (ref 26.0–34.0)
MCV: 92.5 fL (ref 78.0–100.0)
Platelets: 162 10*3/uL (ref 150–400)
RDW: 12.8 % (ref 11.5–15.5)

## 2012-07-16 LAB — SURGICAL PCR SCREEN: Staphylococcus aureus: NEGATIVE

## 2012-07-16 SURGERY — PERMANENT PACEMAKER GENERATOR CHANGE
Anesthesia: LOCAL

## 2012-07-16 MED ORDER — MIDAZOLAM HCL 2 MG/2ML IJ SOLN
INTRAMUSCULAR | Status: AC
  Filled 2012-07-16: qty 2

## 2012-07-16 MED ORDER — FENTANYL CITRATE 0.05 MG/ML IJ SOLN
INTRAMUSCULAR | Status: AC
  Filled 2012-07-16: qty 2

## 2012-07-16 MED ORDER — MUPIROCIN 2 % EX OINT
TOPICAL_OINTMENT | CUTANEOUS | Status: AC
  Filled 2012-07-16: qty 22

## 2012-07-16 MED ORDER — SODIUM CHLORIDE 0.9 % IV SOLN: INTRAVENOUS | Status: DC

## 2012-07-16 MED ORDER — HEPARIN (PORCINE) IN NACL 2-0.9 UNIT/ML-% IJ SOLN
INTRAMUSCULAR | Status: AC
  Filled 2012-07-16: qty 1000

## 2012-07-16 MED ORDER — SODIUM CHLORIDE 0.9 % IJ SOLN: 3.0000 mL | INTRAMUSCULAR | Status: DC | PRN

## 2012-07-16 MED ORDER — LIDOCAINE HCL (PF) 1 % IJ SOLN
INTRAMUSCULAR | Status: AC
  Filled 2012-07-16: qty 60

## 2012-07-16 MED ORDER — SODIUM CHLORIDE 0.9 % IJ SOLN: 3.0000 mL | Freq: Two times a day (BID) | INTRAMUSCULAR | Status: DC

## 2012-07-16 MED ORDER — MUPIROCIN 2 % EX OINT
TOPICAL_OINTMENT | Freq: Two times a day (BID) | CUTANEOUS | Status: DC
  Administered 2012-07-16: 10:00:00 via NASAL

## 2012-07-16 MED ORDER — SODIUM CHLORIDE 0.45 % IV SOLN
INTRAVENOUS | Status: DC
  Administered 2012-07-16: 10:00:00 via INTRAVENOUS

## 2012-07-16 MED ORDER — SODIUM CHLORIDE 0.9 % IV SOLN: 250.0000 mL | INTRAVENOUS | Status: AC

## 2012-07-16 MED ORDER — SODIUM CHLORIDE 0.9 % IR SOLN
80.0000 mg | Status: DC
  Filled 2012-07-16: qty 2

## 2012-07-16 MED ORDER — CEFAZOLIN SODIUM-DEXTROSE 2-3 GM-% IV SOLR
2.0000 g | INTRAVENOUS | Status: DC
  Filled 2012-07-16 (×2): qty 50

## 2012-07-16 NOTE — Interval H&P Note (Signed)
History and Physical Interval Note:  07/16/2012 1:53 PM  Danielle [name]  has presented today for surgery, with the diagnosis of End of life  The various methods of treatment have been discussed with the patient and family. After consideration of risks, benefits and other options for treatment, the patient has consented to  Procedure(s) (LRB): PERMANENT PACEMAKER GENERATOR CHANGE (N/A) as a surgical intervention .  The patient's history has been reviewed, patient examined, no change in status, stable for surgery.  I have reviewed the patient's chart and labs.  Questions were answered to the patient's satisfaction.     Sherryl Manges

## 2012-07-16 NOTE — CV Procedure (Signed)
Preoperative diagnosis [facility] Postoperative diagnosis same/  Procedure: Generator replacement  Pocket revision  Following informed consent the patient was brought to the electrophysiology laboratory in place of the fluoroscopic table in the supine position after routine prep and drape lidocaine was infiltrated in the region of the previous incision and carried down to later the device pocket using sharp dissection and electrocautery. The pocket was opened the device was freed up and was explanted.  Interrogation of the previously implanted ventricular lead   demonstrated an R wave of N/A millivolts., and impedance of 771ohms, and a pacing threshold of 1.2 volts at 0.5 msec.    The previously implanted atrial lead Medtronic [id number] added P-wave amplitude of 1.6 illlivolts  and impedance of  693 ohms, and a pacing threshold of 1.8volts at 1.72milliseconds.  The leads were inspected; heme discoloration of both leads was noted. The leads were then attached to a Medtronic adapta L  pulse generator, serial number [id number].    The pocket was irrigated with antibiotic containing saline solution hemostasis was assured and the leads and the device were placed in the pocket. The wound is then closed in 3 layers in normal fashion.  The patient tolerated the procedure without apparent complication.  Sherryl Manges

## 2012-07-16 NOTE — H&P (View-Only) (Signed)
  HPI  Danielle [name] is a 76 y.o. female is seen in followup for complete heart block for which she is status post pacemaker. She continues to approach ERI and is being followed transtelophonic monitoring.    Over the last few days she has noted a fixed rate at 65 and LH and Presyncope with some sob  NO chest pain  This is her 4th procedure, initial implant followed by extraction in 1997 and generator replacement 2006  Past Medical History  Diagnosis Date  . NEPHROLITHIASIS, HX OF 01/09/2008  . DVT, HX OF 01/09/2008  . INSOMNIA-SLEEP DISORDER-UNSPEC 06/15/2009  . Dizziness and giddiness 02/25/2010  . OSTEOARTHRITIS, CERVICAL SPINE 02/25/2010  . GERD 01/09/2008  . ALLERGIC RHINITIS 01/09/2008  . ASTHMATIC BRONCHITIS, ACUTE 10/20/2008  . AV BLOCK, COMPLETE 12/16/2009  . HYPERTENSION 01/09/2008  . DEPRESSION 01/09/2008  . ANXIETY 01/09/2008  . ANEMIA-NOS 01/09/2008  . HYPERLIPIDEMIA 01/09/2008  . GLUCOSE INTOLERANCE 01/09/2008  . HYPOTHYROIDISM 01/09/2008  . Impaired glucose tolerance 07/28/2011    Past Surgical History  Procedure Date  . Pacemaker placement     MedTronic EnPulse [id number]  . Ercp w/ sphicterotomy   . Hemorrhoid surgery   . Polypectomy     Uterine    Current Outpatient Prescriptions  Medication Sig Dispense Refill  . aspirin 81 MG EC tablet Take 81 mg by mouth daily.        Marland Kitchen atenolol (TENORMIN) 50 MG tablet Take 1 tablet (50 mg total) by mouth daily.  90 tablet  3  . atorvastatin (LIPITOR) 40 MG tablet Take 1 tablet (40 mg total) by mouth daily.  90 tablet  3  . azelastine (ASTELIN) 137 MCG/SPRAY nasal spray Place 1 spray into the nose 2 (two) times daily. Use in each nostril as directed  30 mL  5  . enalapril (VASOTEC) 10 MG tablet Take 1 tablet (10 mg total) by mouth daily.  90 tablet  3  . levothyroxine (SYNTHROID, LEVOTHROID) 112 MCG tablet Take 1 tablet (112 mcg total) by mouth daily.  90 tablet  3  . Omeprazole 20 MG TBEC Take 1 tablet (20 mg total) by mouth  daily.  90 each  3  . phenazopyridine (PYRIDIUM) 200 MG tablet Take 200 mg by mouth daily.       Marland Kitchen zolpidem (AMBIEN CR) 12.5 MG CR tablet take 1 tablet by mouth at bedtime if needed  30 tablet  5    Allergies  Allergen Reactions  . Codeine Nausea And Vomiting    Review of Systems negative except from HPI and PMH  Physical Exam BP 80/58  Pulse 66  Ht 5\' 4"  (1.626 m)  Wt 136 lb (61.689 kg)  BMI 23.34 kg/m2  SpO2 98% Well developed and well nourished in no acute distress HENT normal E scleral and icterus clear Neck Supple JVP flat; carotids brisk and full Clear to ausculation Device pocket well healed; without hematoma or erythema Regular rate and rhythm, Soft with active bowel sounds No clubbing cyanosis no Edema Alert and oriented, grossly normal motor and sensory function Skin Warm and Dry    Assessment and  Plan

## 2012-07-24 ENCOUNTER — Encounter: Payer: Self-pay | Admitting: Internal Medicine

## 2012-07-29 ENCOUNTER — Encounter: Payer: Self-pay | Admitting: Internal Medicine

## 2012-07-29 ENCOUNTER — Other Ambulatory Visit: Payer: Self-pay | Admitting: Internal Medicine

## 2012-07-29 ENCOUNTER — Ambulatory Visit (INDEPENDENT_AMBULATORY_CARE_PROVIDER_SITE_OTHER): Payer: Medicare Other | Admitting: *Deleted

## 2012-07-29 DIAGNOSIS — I442 Atrioventricular block, complete: Secondary | ICD-10-CM

## 2012-07-29 NOTE — Telephone Encounter (Signed)
Faxed hardcopy to pharmacy. 

## 2012-07-29 NOTE — Progress Notes (Signed)
Wound check-PPM 

## 2012-07-29 NOTE — Telephone Encounter (Signed)
Done hardcopy to robin  

## 2012-07-30 LAB — PACEMAKER DEVICE OBSERVATION
AL AMPLITUDE: 1.4 mv
AL IMPEDENCE PM: 658 Ohm
ATRIAL PACING PM: 78
BAMS-0001: 150 {beats}/min
RV LEAD IMPEDENCE PM: 815 Ohm
RV LEAD THRESHOLD: 1.75 V

## 2012-08-02 ENCOUNTER — Encounter: Payer: Self-pay | Admitting: Internal Medicine

## 2012-08-02 ENCOUNTER — Ambulatory Visit (INDEPENDENT_AMBULATORY_CARE_PROVIDER_SITE_OTHER): Payer: Medicare Other | Admitting: Internal Medicine

## 2012-08-02 VITALS — BP 114/74 | HR 73 | Temp 98.6°F | Ht 64.0 in | Wt 134.2 lb

## 2012-08-02 DIAGNOSIS — Z23 Encounter for immunization: Secondary | ICD-10-CM | POA: Diagnosis not present

## 2012-08-02 DIAGNOSIS — J309 Allergic rhinitis, unspecified: Secondary | ICD-10-CM | POA: Diagnosis not present

## 2012-08-02 DIAGNOSIS — Z0000 Encounter for general adult medical examination without abnormal findings: Secondary | ICD-10-CM

## 2012-08-02 DIAGNOSIS — J019 Acute sinusitis, unspecified: Secondary | ICD-10-CM | POA: Diagnosis not present

## 2012-08-02 DIAGNOSIS — I10 Essential (primary) hypertension: Secondary | ICD-10-CM | POA: Diagnosis not present

## 2012-08-02 DIAGNOSIS — R42 Dizziness and giddiness: Secondary | ICD-10-CM

## 2012-08-02 MED ORDER — AZITHROMYCIN 250 MG PO TABS: ORAL_TABLET | ORAL | Status: AC

## 2012-08-02 MED ORDER — MECLIZINE HCL 12.5 MG PO TABS: 12.5000 mg | ORAL_TABLET | Freq: Three times a day (TID) | ORAL | Status: AC | PRN

## 2012-08-02 MED ORDER — METHYLPREDNISOLONE ACETATE 80 MG/ML IJ SUSP
120.0000 mg | Freq: Once | INTRAMUSCULAR | Status: AC
  Administered 2012-08-02: 120 mg via INTRAMUSCULAR

## 2012-08-02 NOTE — Patient Instructions (Addendum)
You had the flu shot today, and the steroid shot Take all new medications as prescribed  - the antibiotic Please take the nasonex samples at 2 spray  per day each side Continue all other medications as before  - the astelin (so you should not have to take allegra or similar medication) Please avoid sudafed type medications due to your heart You can also take OTC Mucinex (or it's generic store brand) for congestion as well You are also given antivert (generic) for dizziness, but watch for sleepiness with this

## 2012-08-03 ENCOUNTER — Encounter: Payer: Self-pay | Admitting: Internal Medicine

## 2012-08-03 DIAGNOSIS — R42 Dizziness and giddiness: Secondary | ICD-10-CM

## 2012-08-03 DIAGNOSIS — Z0000 Encounter for general adult medical examination without abnormal findings: Secondary | ICD-10-CM | POA: Insufficient documentation

## 2012-08-03 DIAGNOSIS — J019 Acute sinusitis, unspecified: Secondary | ICD-10-CM | POA: Insufficient documentation

## 2012-08-03 DIAGNOSIS — Z0001 Encounter for general adult medical examination with abnormal findings: Secondary | ICD-10-CM | POA: Insufficient documentation

## 2012-08-03 HISTORY — DX: Dizziness and giddiness: R42

## 2012-08-03 NOTE — Assessment & Plan Note (Signed)
Mild, for meclizine prn,  to f/u any worsening symptoms or concerns 

## 2012-08-03 NOTE — Assessment & Plan Note (Addendum)
Mild uncontrolled, to add flonase, cont astelin,  to f/u any worsening symptoms or concerns, also for mucinex prn for prob left eustachain valve symptoms, also for nasonex samples today

## 2012-08-03 NOTE — Assessment & Plan Note (Signed)
stable overall by hx and exam, most recent data reviewed with pt, and pt to continue medical treatment as before BP Readings from Last 3 Encounters:  08/02/12 114/74  07/16/12 106/69  07/16/12 106/69

## 2012-08-03 NOTE — Assessment & Plan Note (Signed)
Mild to mod, for antibx course,  to f/u any worsening symptoms or concerns 

## 2012-08-03 NOTE — Progress Notes (Signed)
Subjective:    Patient ID: Danielle [name], female    DOB: 09-Nov-1936, 76 y.o.   MRN: [id number]  HPI   Here with 3 days acute onset fever, facial pain, pressure, general weakness and malaise, and greenish d/c, with slight ST, but little to no cough and Pt denies chest pain, increased sob or doe, wheezing, orthopnea, PND, increased LE swelling, palpitations, dizziness or syncope, but also with bilat ear pressure/pain left > right.  Does have several wks ongoing nasal allergy symptoms with clear congestion, itch and sneeze.  Pt denies new neurological symptoms such as new headache, or facial or extremity weakness or numbness   Pt denies polydipsia, polyuria. Denies worsening depressive symptoms, suicidal ideation, or panic.  Also with occasional vertigo like dizziness in the past 2 days.  Past Medical History  Diagnosis Date  . NEPHROLITHIASIS, HX OF 01/09/2008  . DVT, HX OF 01/09/2008  . INSOMNIA-SLEEP DISORDER-UNSPEC 06/15/2009  . Dizziness and giddiness 02/25/2010  . OSTEOARTHRITIS, CERVICAL SPINE 02/25/2010  . GERD 01/09/2008  . ALLERGIC RHINITIS 01/09/2008  . ASTHMATIC BRONCHITIS, ACUTE 10/20/2008  . AV BLOCK, COMPLETE 12/16/2009  . HYPERTENSION 01/09/2008  . DEPRESSION 01/09/2008  . ANXIETY 01/09/2008  . ANEMIA-NOS 01/09/2008  . HYPERLIPIDEMIA 01/09/2008  . GLUCOSE INTOLERANCE 01/09/2008  . HYPOTHYROIDISM 01/09/2008  . Impaired glucose tolerance 07/28/2011   Past Surgical History  Procedure Date  . Pacemaker placement     MedTronic EnPulse [id number]  . Ercp w/ sphicterotomy   . Hemorrhoid surgery   . Polypectomy     Uterine    reports that she quit smoking about 40 years ago. Her smoking use included Cigarettes. She has a 5 pack-year smoking history. She does not have any smokeless tobacco history on file. She reports that she drinks alcohol. Her drug history not on file. family history includes Cancer in her mother; Pulmonary embolism in her father; and Sudden death in her mother. Allergies   Allergen Reactions  . Codeine Nausea And Vomiting   Current Outpatient Prescriptions on File Prior to Visit  Medication Sig Dispense Refill  . aspirin 81 MG EC tablet Take 81 mg by mouth daily.        Marland Kitchen atenolol (TENORMIN) 50 MG tablet Take 50 mg by mouth daily.      Marland Kitchen atorvastatin (LIPITOR) 40 MG tablet Take 40 mg by mouth daily.      Marland Kitchen azelastine (ASTELIN) 137 MCG/SPRAY nasal spray Place 1 spray into the nose 2 (two) times daily. Use in each nostril as directed      . enalapril (VASOTEC) 10 MG tablet Take 10 mg by mouth daily.      Marland Kitchen levothyroxine (SYNTHROID, LEVOTHROID) 112 MCG tablet Take 112 mcg by mouth daily.      Marland Kitchen omeprazole (PRILOSEC) 20 MG capsule Take 20 mg by mouth daily.      Marland Kitchen zolpidem (AMBIEN CR) 12.5 MG CR tablet take 1 tablet by mouth at bedtime if needed  30 tablet  5  . phenazopyridine (PYRIDIUM) 200 MG tablet Take 200 mg by mouth daily.        No current facility-administered medications on file prior to visit.   Review of Systems  Constitutional: Negative for diaphoresis and unexpected weight change.  HENT: Negative for tinnitus.   Eyes: Negative for photophobia and visual disturbance.  Respiratory: Negative for choking and stridor.   Gastrointestinal: Negative for vomiting and blood in stool.  Genitourinary: Negative for hematuria and decreased urine volume.  Musculoskeletal: Negative for gait  problem.  Skin: Negative for color change and wound.  Neurological: Negative for tremors and numbness.  Psychiatric/Behavioral: Negative for decreased concentration. The patient is not hyperactive.      Objective:   Physical Exam BP 114/74  Pulse 73  Temp 98.6 F (37 C) (Oral)  Ht 5\' 4"  (1.626 m)  Wt 134 lb 4 oz (60.895 kg)  BMI 23.04 kg/m2  SpO2 97% Physical Exam  VS noted, mild ill appearing Constitutional: Pt appears well-developed and well-nourished.  HENT: Head: Normocephalic.  Right Ear: External ear normal.  Left Ear: External ear normal.  Bilat tm's  mild erythema left > right,  Sinus tender.  Pharynx mild erythema Eyes: Conjunctivae and EOM are normal. Pupils are equal, round, and reactive to light.  Neck: Normal range of motion. Neck supple.  Cardiovascular: Normal rate and regular rhythm.   Pulmonary/Chest: Effort normal and breath sounds normal.  - no rales or wheezing Neurological: Pt is alert. Not confused , motor/dtr intact Skin: Skin is warm. No erythema. No rash Psychiatric: Pt behavior is normal. Thought content normal. not depressed affect, mild nervous    Assessment & Plan:

## 2012-08-06 ENCOUNTER — Encounter: Payer: Medicare Other | Admitting: Internal Medicine

## 2012-09-05 DIAGNOSIS — R300 Dysuria: Secondary | ICD-10-CM | POA: Diagnosis not present

## 2012-09-05 DIAGNOSIS — R3989 Other symptoms and signs involving the genitourinary system: Secondary | ICD-10-CM | POA: Diagnosis not present

## 2012-09-20 ENCOUNTER — Encounter: Payer: Self-pay | Admitting: Internal Medicine

## 2012-09-20 ENCOUNTER — Ambulatory Visit (INDEPENDENT_AMBULATORY_CARE_PROVIDER_SITE_OTHER): Payer: Medicare Other | Admitting: Internal Medicine

## 2012-09-20 VITALS — BP 104/60 | HR 89 | Temp 98.0°F | Ht 63.0 in | Wt 132.5 lb

## 2012-09-20 DIAGNOSIS — R1011 Right upper quadrant pain: Secondary | ICD-10-CM | POA: Diagnosis not present

## 2012-09-20 DIAGNOSIS — R7309 Other abnormal glucose: Secondary | ICD-10-CM | POA: Diagnosis not present

## 2012-09-20 DIAGNOSIS — I10 Essential (primary) hypertension: Secondary | ICD-10-CM | POA: Diagnosis not present

## 2012-09-20 DIAGNOSIS — R7302 Impaired glucose tolerance (oral): Secondary | ICD-10-CM

## 2012-09-20 MED ORDER — ESOMEPRAZOLE MAGNESIUM 40 MG PO CPDR: 40.0000 mg | DELAYED_RELEASE_CAPSULE | Freq: Every day | ORAL | Status: DC

## 2012-09-20 NOTE — Patient Instructions (Addendum)
Take all new medications as prescribed  - the nexium 40 mg twice per day for 3 days, then once per day after that OK to hold on taking the omeprazole You will be contacted regarding the referral for: ultrasound (see the [facility] before leaving today) - for Monday Nov 4? Please go to LAB in the [facility] for the blood and/or urine tests to be done today You will be contacted by phone if any changes need to be made immediately.  Otherwise, you will receive a letter about your results with an explanation. Please return in 6 months, or sooner if needed

## 2012-09-21 ENCOUNTER — Encounter: Payer: Self-pay | Admitting: Internal Medicine

## 2012-09-21 NOTE — Progress Notes (Signed)
Subjective:    Patient ID: Danielle [name], female    DOB: Jan 02, 1936, 76 y.o.   MRN: [id number]  HPI  Here for "flank pain" but states CC is actually upper abd pain primarily RUQ intermiteent mildx 1 mo, then worse for 2 wks, with some nausea but no vomiting, omeprazole may help somewhat but not clear as freq and severity has not improved,  With some radiation towards the right flank area, sharp and dull sometimes, no vomiting, fever and Denies worsening reflux, dysphagia, bowel change or blood. Nothing seems to make better or worse.   Pt denies fever, wt loss, night sweats, loss of appetite, or other constitutional symptoms. Pt denies chest pain, increased sob or doe, wheezing, orthopnea, PND, increased LE swelling, palpitations, dizziness or syncope.  Pt denies new neurological symptoms such as new headache, or facial or extremity weakness or numbness   Pt denies polydipsia, polyuria     Past Medical History  Diagnosis Date  . NEPHROLITHIASIS, HX OF 01/09/2008  . DVT, HX OF 01/09/2008  . INSOMNIA-SLEEP DISORDER-UNSPEC 06/15/2009  . Dizziness and giddiness 02/25/2010  . OSTEOARTHRITIS, CERVICAL SPINE 02/25/2010  . GERD 01/09/2008  . ALLERGIC RHINITIS 01/09/2008  . ASTHMATIC BRONCHITIS, ACUTE 10/20/2008  . AV BLOCK, COMPLETE 12/16/2009  . HYPERTENSION 01/09/2008  . DEPRESSION 01/09/2008  . ANXIETY 01/09/2008  . ANEMIA-NOS 01/09/2008  . HYPERLIPIDEMIA 01/09/2008  . GLUCOSE INTOLERANCE 01/09/2008  . HYPOTHYROIDISM 01/09/2008  . Impaired glucose tolerance 07/28/2011   Past Surgical History  Procedure Date  . Pacemaker placement     MedTronic EnPulse [id number]  . Ercp w/ sphicterotomy   . Hemorrhoid surgery   . Polypectomy     Uterine    reports that she quit smoking about 40 years ago. Her smoking use included Cigarettes. She has a 5 pack-year smoking history. She does not have any smokeless tobacco history on file. She reports that she drinks alcohol. Her drug history not on file. family history  includes Cancer in her mother; Pulmonary embolism in her father; and Sudden death in her mother. Allergies  Allergen Reactions  . Codeine Nausea And Vomiting   Current Outpatient Prescriptions on File Prior to Visit  Medication Sig Dispense Refill  . aspirin 81 MG EC tablet Take 81 mg by mouth daily.        Marland Kitchen atenolol (TENORMIN) 50 MG tablet Take 50 mg by mouth daily.      Marland Kitchen atorvastatin (LIPITOR) 40 MG tablet Take 40 mg by mouth daily.      Marland Kitchen azelastine (ASTELIN) 137 MCG/SPRAY nasal spray Place 1 spray into the nose 2 (two) times daily. Use in each nostril as directed      . enalapril (VASOTEC) 10 MG tablet Take 10 mg by mouth daily.      Marland Kitchen levothyroxine (SYNTHROID, LEVOTHROID) 112 MCG tablet Take 112 mcg by mouth daily.      Marland Kitchen omeprazole (PRILOSEC) 20 MG capsule Take 20 mg by mouth daily.      . phenazopyridine (PYRIDIUM) 200 MG tablet Take 200 mg by mouth daily.       Marland Kitchen zolpidem (AMBIEN CR) 12.5 MG CR tablet take 1 tablet by mouth at bedtime if needed  30 tablet  5  . esomeprazole (NEXIUM) 40 MG capsule Take 1 capsule (40 mg total) by mouth daily.  30 capsule  11   Review of Systems  Constitutional: Negative for diaphoresis and unexpected weight change.  HENT: Negative for tinnitus.   Eyes: Negative for photophobia and  visual disturbance.  Respiratory: Negative for choking and stridor.   Gastrointestinal: Negative for vomiting and blood in stool.  Genitourinary: Negative for hematuria and decreased urine volume.  Musculoskeletal: Negative for gait problem.  Skin: Negative for color change and wound.  Neurological: Negative for tremors and numbness.  Psychiatric/Behavioral: Negative for decreased concentration. The patient is not hyperactive.       Objective:   Physical Exam BP 104/60  Pulse 89  Temp 98 F (36.7 C) (Oral)  Ht 5\' 3"  (1.6 m)  Wt 132 lb 8 oz (60.102 kg)  BMI 23.47 kg/m2  SpO2 94% Physical Exam  VS noted Constitutional: Pt appears well-developed and  well-nourished.  HENT: Head: Normocephalic.  Right Ear: External ear normal.  Left Ear: External ear normal.  Eyes: Conjunctivae and EOM are normal. Pupils are equal, round, and reactive to light.  Neck: Normal range of motion. Neck supple.  Cardiovascular: Normal rate and regular rhythm.   Pulmonary/Chest: Effort normal and breath sounds normal.  Abd:  Soft, NT, non-distended, + BS except for mild RUQ tender to moderate palpation, no guarding or rebound Neurological: Pt is alert. Not confused  Skin: Skin is warm. No erythema.  Psychiatric: Pt behavior is normal. Thought content normal.     Assessment & Plan:

## 2012-09-21 NOTE — Assessment & Plan Note (Signed)
Etiology unclear, for abd u/s and labs, gave nexium 40 bid for 3 days samples, then 40 qd after,  to f/u any worsening symptoms or concerns

## 2012-09-21 NOTE — Assessment & Plan Note (Signed)
stable overall by hx and exam, most recent data reviewed with pt, and pt to continue medical treatment as before BP Readings from Last 3 Encounters:  09/20/12 104/60  08/02/12 114/74  07/16/12 106/69

## 2012-09-21 NOTE — Assessment & Plan Note (Signed)
stable overall by hx and exam, most recent data reviewed with pt, and pt to continue medical treatment as before  Lab Results  Component Value Date   HGBA1C 5.4 07/31/2011    

## 2012-09-23 ENCOUNTER — Encounter: Payer: Self-pay | Admitting: Internal Medicine

## 2012-09-23 ENCOUNTER — Ambulatory Visit (HOSPITAL_COMMUNITY)
Admission: RE | Admit: 2012-09-23 | Discharge: 2012-09-23 | Disposition: A | Discharge status: Home / Self Care | Payer: Medicare Other | Source: Ambulatory Visit | Attending: Internal Medicine | Admitting: Internal Medicine

## 2012-09-23 ENCOUNTER — Other Ambulatory Visit (INDEPENDENT_AMBULATORY_CARE_PROVIDER_SITE_OTHER): Payer: Medicare Other

## 2012-09-23 DIAGNOSIS — R1011 Right upper quadrant pain: Secondary | ICD-10-CM | POA: Insufficient documentation

## 2012-09-23 LAB — BASIC METABOLIC PANEL
BUN: 22 mg/dL (ref 6–23)
Creatinine, Ser: 0.9 mg/dL (ref 0.4–1.2)
GFR: 68.41 mL/min (ref >60.00)
Glucose, Bld: 107 mg/dL — ABNORMAL HIGH (ref 70–99)
Potassium: 4.8 mEq/L (ref 3.5–5.1)

## 2012-09-23 LAB — CBC WITH DIFFERENTIAL/PLATELET
Basophils Relative: 0.4 % (ref 0.0–3.0)
Eosinophils Relative: 5.9 % — ABNORMAL HIGH (ref 0.0–5.0)
HCT: 36.1 % (ref 36.0–46.0)
Monocytes Relative: 10.9 % (ref 3.0–12.0)
Neutrophils Relative %: 50.8 % (ref 43.0–77.0)
Platelets: 155 10*3/uL (ref 150.0–400.0)
RBC: 3.97 Mil/uL (ref 3.87–5.11)
WBC: 4.9 10*3/uL (ref 4.5–10.5)

## 2012-09-23 LAB — LIPASE: Lipase: 54 U/L (ref 11.0–59.0)

## 2012-09-23 LAB — HEPATIC FUNCTION PANEL
ALT: 21 U/L (ref 0–35)
AST: 24 U/L (ref 0–37)
Albumin: 4 g/dL (ref 3.5–5.2)
Total Bilirubin: 1 mg/dL (ref 0.3–1.2)

## 2012-09-23 LAB — SEDIMENTATION RATE: Sed Rate: 15 mm/hr (ref 0–22)

## 2012-09-26 DIAGNOSIS — J3081 Allergic rhinitis due to animal (cat) (dog) hair and dander: Secondary | ICD-10-CM | POA: Diagnosis not present

## 2012-09-26 DIAGNOSIS — H81399 Other peripheral vertigo, unspecified ear: Secondary | ICD-10-CM | POA: Diagnosis not present

## 2012-09-26 DIAGNOSIS — J3089 Other allergic rhinitis: Secondary | ICD-10-CM | POA: Diagnosis not present

## 2012-11-03 ENCOUNTER — Other Ambulatory Visit: Payer: Self-pay | Admitting: Internal Medicine

## 2012-11-04 ENCOUNTER — Encounter: Payer: Medicare Other | Admitting: *Deleted

## 2012-11-18 ENCOUNTER — Encounter: Payer: Self-pay | Admitting: *Deleted

## 2012-11-25 ENCOUNTER — Ambulatory Visit (INDEPENDENT_AMBULATORY_CARE_PROVIDER_SITE_OTHER): Payer: Medicare Other | Admitting: Internal Medicine

## 2012-11-25 ENCOUNTER — Encounter: Payer: Self-pay | Admitting: Internal Medicine

## 2012-11-25 VITALS — BP 114/70 | HR 83 | Temp 98.7°F | Ht 64.0 in | Wt 136.1 lb

## 2012-11-25 DIAGNOSIS — J209 Acute bronchitis, unspecified: Secondary | ICD-10-CM | POA: Diagnosis not present

## 2012-11-25 DIAGNOSIS — F329 Major depressive disorder, single episode, unspecified: Secondary | ICD-10-CM

## 2012-11-25 DIAGNOSIS — I10 Essential (primary) hypertension: Secondary | ICD-10-CM | POA: Diagnosis not present

## 2012-11-25 MED ORDER — HYDROCODONE-HOMATROPINE 5-1.5 MG/5ML PO SYRP: 5.0000 mL | ORAL_SOLUTION | Freq: Four times a day (QID) | ORAL | Status: DC | PRN

## 2012-11-25 MED ORDER — AZITHROMYCIN 250 MG PO TABS: ORAL_TABLET | ORAL | Status: DC

## 2012-11-25 NOTE — Assessment & Plan Note (Signed)
stable overall by hx and exam, most recent data reviewed with pt, and pt to continue medical treatment as before BP Readings from Last 3 Encounters:  11/25/12 114/70  09/20/12 104/60  08/02/12 114/74

## 2012-11-25 NOTE — Assessment & Plan Note (Signed)
stable overall by hx and exam, most recent data reviewed with pt, and pt to continue medical treatment as before Lab Results  Component Value Date   WBC 4.9 09/23/2012   HGB 12.3 09/23/2012   HCT 36.1 09/23/2012   PLT 155.0 09/23/2012   GLUCOSE 107* 09/23/2012   CHOL 253* 07/31/2011   TRIG 170.0* 07/31/2011   HDL 102.00 07/31/2011   LDLDIRECT 136.1 07/31/2011   LDLCALC 86 07/06/2008   ALT 21 09/23/2012   AST 24 09/23/2012   NA 139 09/23/2012   K 4.8 09/23/2012   CL 106 09/23/2012   CREATININE 0.9 09/23/2012   BUN 22 09/23/2012   CO2 27 09/23/2012   TSH 0.69 07/31/2011   INR 1.01 07/16/2012   HGBA1C 5.4 07/31/2011

## 2012-11-25 NOTE — Patient Instructions (Addendum)
Take all new medications as prescribed Continue all other medications as before Thank you for enrolling in MyChart. Please follow the instructions below to securely access your online medical record. MyChart allows you to send messages to your doctor, view your test results, renew your prescriptions, schedule appointments, and more. To Log into MyChart, please go to [email or web address], and your Username is: [email or web address]

## 2012-11-25 NOTE — Progress Notes (Signed)
Subjective:    Patient ID: Danielle [name], female    DOB: [birth year], 77 y.o.   MRN: [id number]  HPI  Here with acute onset mild to mod 2-3 days ST, HA, general weakness and malaise, with prod cough greenish sputum, but Pt denies chest pain, increased sob or doe, wheezing, orthopnea, PND, increased LE swelling, palpitations, dizziness or syncope.  Pt denies new neurological symptoms such as new headache, or facial or extremity weakness or numbness   Pt denies polydipsia, polyuria.   Denies worsening depressive symptoms, suicidal ideation, or panic Past Medical History  Diagnosis Date  . NEPHROLITHIASIS, HX OF 01/09/2008  . DVT, HX OF 01/09/2008  . INSOMNIA-SLEEP DISORDER-UNSPEC 06/15/2009  . Dizziness and giddiness 02/25/2010  . OSTEOARTHRITIS, CERVICAL SPINE 02/25/2010  . GERD 01/09/2008  . ALLERGIC RHINITIS 01/09/2008  . ASTHMATIC BRONCHITIS, ACUTE 10/20/2008  . AV BLOCK, COMPLETE 12/16/2009  . HYPERTENSION 01/09/2008  . DEPRESSION 01/09/2008  . ANXIETY 01/09/2008  . ANEMIA-NOS 01/09/2008  . HYPERLIPIDEMIA 01/09/2008  . GLUCOSE INTOLERANCE 01/09/2008  . HYPOTHYROIDISM 01/09/2008  . Impaired glucose tolerance 07/28/2011   Past Surgical History  Procedure Date  . Pacemaker placement     MedTronic EnPulse [id number]  . Ercp w/ sphicterotomy   . Hemorrhoid surgery   . Polypectomy     Uterine    reports that she quit smoking about 41 years ago. Her smoking use included Cigarettes. She has a 5 pack-year smoking history. She does not have any smokeless tobacco history on file. She reports that she drinks alcohol. Her drug history not on file. family history includes Cancer in her mother; Pulmonary embolism in her father; and Sudden death in her mother. Allergies  Allergen Reactions  . Codeine Nausea And Vomiting   Current Outpatient Prescriptions on File Prior to Visit  Medication Sig Dispense Refill  . aspirin 81 MG EC tablet Take 81 mg by mouth daily.        Marland Kitchen atenolol (TENORMIN) 50 MG tablet  Take 50 mg by mouth daily.      Marland Kitchen atenolol (TENORMIN) 50 MG tablet TAKE 1 TABLET BY MOUTH ONCE DAILY  90 tablet  3  . atorvastatin (LIPITOR) 40 MG tablet Take 40 mg by mouth daily.      Marland Kitchen azelastine (ASTELIN) 137 MCG/SPRAY nasal spray Place 1 spray into the nose 2 (two) times daily. Use in each nostril as directed      . enalapril (VASOTEC) 10 MG tablet Take 10 mg by mouth daily.      . enalapril (VASOTEC) 10 MG tablet TAKE 1 TABLET BY MOUTH ONCE DAILY  90 tablet  3  . esomeprazole (NEXIUM) 40 MG capsule Take 1 capsule (40 mg total) by mouth daily.  30 capsule  11  . levothyroxine (SYNTHROID, LEVOTHROID) 112 MCG tablet Take 112 mcg by mouth daily.      Marland Kitchen omeprazole (PRILOSEC) 20 MG capsule Take 20 mg by mouth daily.      . phenazopyridine (PYRIDIUM) 200 MG tablet Take 200 mg by mouth daily.       Marland Kitchen zolpidem (AMBIEN CR) 12.5 MG CR tablet take 1 tablet by mouth at bedtime if needed  30 tablet  5   Review of Systems  Constitutional: Negative for diaphoresis and unexpected weight change.  HENT: Negative for tinnitus.   Eyes: Negative for photophobia and visual disturbance.  Respiratory: Negative for choking and stridor.   Gastrointestinal: Negative for vomiting and blood in stool.  Genitourinary: Negative for hematuria and decreased  urine volume.  Musculoskeletal: Negative for gait problem.  Skin: Negative for color change and wound.  Neurological: Negative for tremors and numbness.  Psychiatric/Behavioral: Negative for decreased concentration. The patient is not hyperactive.       Objective:   Physical Exam BP 114/70  Pulse 83  Temp 98.7 F (37.1 C) (Oral)  Ht 5\' 4"  (1.626 m)  Wt 136 lb 2 oz (61.746 kg)  BMI 23.37 kg/m2  SpO2 92% Physical Exam  VS noted mild ill Constitutional: Pt appears well-developed and well-nourished.  HENT: Head: Normocephalic.  Right Ear: External ear normal.  Left Ear: External ear normal.  Bilat tm's mild erythema.  Sinus nontender.  Pharynx mild  erythema Eyes: Conjunctivae and EOM are normal. Pupils are equal, round, and reactive to light.  Neck: Normal range of motion. Neck supple.  Cardiovascular: Normal rate and regular rhythm.   Pulmonary/Chest: Effort normal and breath sounds normal.  Neurological: Pt is alert. Not confused  Skin: Skin is warm. No erythema.  Psychiatric: Pt behavior is normal. Thought content normal. not depressed affec    Assessment & Plan:

## 2012-11-25 NOTE — Assessment & Plan Note (Signed)
Mild to mod, for antibx course,  to f/u any worsening symptoms or concerns 

## 2012-12-02 ENCOUNTER — Ambulatory Visit (INDEPENDENT_AMBULATORY_CARE_PROVIDER_SITE_OTHER): Payer: Medicare Other | Admitting: *Deleted

## 2012-12-02 DIAGNOSIS — I442 Atrioventricular block, complete: Secondary | ICD-10-CM | POA: Diagnosis not present

## 2012-12-02 DIAGNOSIS — Z950 Presence of cardiac pacemaker: Secondary | ICD-10-CM

## 2012-12-02 LAB — REMOTE PACEMAKER DEVICE
AL AMPLITUDE: 2.8 mv
ATRIAL PACING PM: 70
BAMS-0001: 150 {beats}/min
BATTERY VOLTAGE: 2.79 V
RV LEAD IMPEDENCE PM: 892 Ohm
VENTRICULAR PACING PM: 100

## 2012-12-16 ENCOUNTER — Encounter: Payer: Self-pay | Admitting: *Deleted

## 2012-12-23 ENCOUNTER — Encounter: Payer: Self-pay | Admitting: Internal Medicine

## 2013-01-28 ENCOUNTER — Ambulatory Visit: Payer: Medicare Other | Admitting: Nurse Practitioner

## 2013-02-04 ENCOUNTER — Ambulatory Visit: Payer: Medicare Other | Admitting: Nurse Practitioner

## 2013-03-03 ENCOUNTER — Encounter: Payer: Medicare Other | Admitting: *Deleted

## 2013-03-11 DIAGNOSIS — R3915 Urgency of urination: Secondary | ICD-10-CM | POA: Diagnosis not present

## 2013-03-11 DIAGNOSIS — R350 Frequency of micturition: Secondary | ICD-10-CM | POA: Diagnosis not present

## 2013-03-11 DIAGNOSIS — R3129 Other microscopic hematuria: Secondary | ICD-10-CM | POA: Diagnosis not present

## 2013-03-12 ENCOUNTER — Encounter: Payer: Self-pay | Admitting: *Deleted

## 2013-04-02 ENCOUNTER — Ambulatory Visit (INDEPENDENT_AMBULATORY_CARE_PROVIDER_SITE_OTHER): Payer: Medicare Other | Admitting: *Deleted

## 2013-04-02 DIAGNOSIS — I442 Atrioventricular block, complete: Secondary | ICD-10-CM

## 2013-04-02 DIAGNOSIS — Z950 Presence of cardiac pacemaker: Secondary | ICD-10-CM

## 2013-04-08 LAB — REMOTE PACEMAKER DEVICE
AL AMPLITUDE: 2.8 mv
BAMS-0001: 150 {beats}/min
RV LEAD IMPEDENCE PM: 907 Ohm
RV LEAD THRESHOLD: 1.625 V
VENTRICULAR PACING PM: 100

## 2013-04-29 ENCOUNTER — Encounter: Payer: Self-pay | Admitting: *Deleted

## 2013-05-06 ENCOUNTER — Encounter: Payer: Self-pay | Admitting: Internal Medicine

## 2013-05-26 ENCOUNTER — Other Ambulatory Visit: Payer: Self-pay | Admitting: Internal Medicine

## 2013-07-11 ENCOUNTER — Encounter: Payer: Self-pay | Admitting: Internal Medicine

## 2013-07-11 ENCOUNTER — Ambulatory Visit (INDEPENDENT_AMBULATORY_CARE_PROVIDER_SITE_OTHER): Payer: Medicare Other | Admitting: Internal Medicine

## 2013-07-11 VITALS — BP 122/80 | HR 85 | Temp 98.0°F | Ht 64.0 in | Wt 134.2 lb

## 2013-07-11 DIAGNOSIS — M5412 Radiculopathy, cervical region: Secondary | ICD-10-CM

## 2013-07-11 DIAGNOSIS — F411 Generalized anxiety disorder: Secondary | ICD-10-CM | POA: Diagnosis not present

## 2013-07-11 DIAGNOSIS — I10 Essential (primary) hypertension: Secondary | ICD-10-CM

## 2013-07-11 HISTORY — DX: Radiculopathy, cervical region: M54.12

## 2013-07-11 MED ORDER — PREDNISONE 10 MG PO TABS: ORAL_TABLET | ORAL | Status: DC

## 2013-07-11 MED ORDER — OXYCODONE HCL 5 MG PO TABS: ORAL_TABLET | ORAL | Status: DC

## 2013-07-11 NOTE — Patient Instructions (Addendum)
Please take all new medication as prescribed - the oxycodone for pain, and the prednisone The current medical regimen is effective;  continue present plan and medications. Please have the pharmacy call with any other refills you may need We have to hold on the MRI due to the pacemaker Please keep your appointments with your specialists as you have planned - Dr Logan Bores will be contacted regarding the referral for: Neurosurgury (Dr Dutch Quint or his group)

## 2013-07-11 NOTE — Progress Notes (Addendum)
Subjective:    Patient ID: Danielle [name], female    DOB: 04-07-1936, 77 y.o.   MRN: [id number]  HPI  Here with c/o 1 wk onset mod to severe left neck, shoudler and arm burning pain and numbness with ? Weakness but not sure,  No rash or prior hx of same.   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   Does have known c-spine DJD.  Last seen by ortho 5 yrs ago with flare of neck pain, recommended fur surgury, but second opinion rec'd PT which helped and no further pain.  Pt denies chest pain, increased sob or doe, wheezing, orthopnea, PND, increased LE swelling, palpitations, dizziness or syncope.  Pt denies new neurological symptoms such as new headache, or facial or extremity weakness or numbness except for the above.   Pt denies polydipsia, polyuria Past Medical History  Diagnosis Date  . NEPHROLITHIASIS, HX OF 01/09/2008  . DVT, HX OF 01/09/2008  . INSOMNIA-SLEEP DISORDER-UNSPEC 06/15/2009  . Dizziness and giddiness 02/25/2010  . OSTEOARTHRITIS, CERVICAL SPINE 02/25/2010  . GERD 01/09/2008  . ALLERGIC RHINITIS 01/09/2008  . ASTHMATIC BRONCHITIS, ACUTE 10/20/2008  . AV BLOCK, COMPLETE 12/16/2009  . HYPERTENSION 01/09/2008  . DEPRESSION 01/09/2008  . ANXIETY 01/09/2008  . ANEMIA-NOS 01/09/2008  . HYPERLIPIDEMIA 01/09/2008  . GLUCOSE INTOLERANCE 01/09/2008  . HYPOTHYROIDISM 01/09/2008  . Impaired glucose tolerance 07/28/2011   Past Surgical History  Procedure Laterality Date  . Pacemaker placement      MedTronic EnPulse [id number]  . Ercp w/ sphicterotomy    . Hemorrhoid surgery    . Polypectomy      Uterine    reports that she quit smoking about 41 years ago. Her smoking use included Cigarettes. She has a 5 pack-year smoking history. She does not have any smokeless tobacco history on file. She reports that  drinks alcohol. Her drug history is not on file. family history includes Cancer in her mother; Pulmonary embolism in her father; Sudden death in her  mother. Allergies  Allergen Reactions  . Codeine Nausea And Vomiting   Current Outpatient Prescriptions on File Prior to Visit  Medication Sig Dispense Refill  . aspirin 81 MG EC tablet Take 81 mg by mouth daily.        Marland Kitchen atenolol (TENORMIN) 50 MG tablet Take 50 mg by mouth daily.      Marland Kitchen atenolol (TENORMIN) 50 MG tablet TAKE 1 TABLET BY MOUTH ONCE DAILY  90 tablet  3  . atorvastatin (LIPITOR) 40 MG tablet Take 40 mg by mouth daily.      Marland Kitchen azelastine (ASTELIN) 137 MCG/SPRAY nasal spray Place 1 spray into the nose 2 (two) times daily. Use in each nostril as directed      . enalapril (VASOTEC) 10 MG tablet Take 10 mg by mouth daily.      . enalapril (VASOTEC) 10 MG tablet TAKE 1 TABLET BY MOUTH ONCE DAILY  90 tablet  3  . esomeprazole (NEXIUM) 40 MG capsule Take 1 capsule (40 mg total) by mouth daily.  30 capsule  11  . HYDROcodone-homatropine (HYCODAN) 5-1.5 MG/5ML syrup Take 5 mLs by mouth every 6 (six) hours as needed for cough.  120 mL  1  . levothyroxine (SYNTHROID, LEVOTHROID) 112 MCG tablet Take 112 mcg by mouth daily.      Marland Kitchen levothyroxine (SYNTHROID, LEVOTHROID) 112 MCG tablet take 1 tablet by mouth once daily  90 tablet  3  . omeprazole (PRILOSEC)  20 MG capsule Take 20 mg by mouth daily.      . phenazopyridine (PYRIDIUM) 200 MG tablet Take 200 mg by mouth daily.       Marland Kitchen zolpidem (AMBIEN CR) 12.5 MG CR tablet take 1 tablet by mouth at bedtime if needed  30 tablet  5   No current facility-administered medications on file prior to visit.   Review of Systems  Constitutional: Negative for unexpected weight change, or unusual diaphoresis  HENT: Negative for tinnitus.   Eyes: Negative for photophobia and visual disturbance.  Respiratory: Negative for choking and stridor.   Gastrointestinal: Negative for vomiting and blood in stool.  Genitourinary: Negative for hematuria and decreased urine volume.  Musculoskeletal: Negative for acute joint swelling Skin: Negative for color change and  wound.  Neurological: Negative for tremors and numbness other than noted  Psychiatric/Behavioral: Negative for decreased concentration or  hyperactivity.       Objective:   Physical Exam BP 122/80  Pulse 85  Temp(Src) 98 F (36.7 C) (Oral)  Ht 5\' 4"  (1.626 m)  Wt 134 lb 4 oz (60.895 kg)  BMI 23.03 kg/m2  SpO2 98% VS noted,  Constitutional: Pt appears well-developed and well-nourished.  HENT: Head: NCAT.  Right Ear: External ear normal.  Left Ear: External ear normal.  Eyes: Conjunctivae and EOM are normal. Pupils are equal, round, and reactive to light.  Neck: Normal range of motion. Neck supple.  Cardiovascular: Normal rate and regular rhythm.   Pulmonary/Chest: Effort normal and breath sounds normal.  Abd:  Soft, NT, non-distended, + BS Neurological: Pt is alert. Not confused ., cn 2-12 intact, sens with decreased sens to left C4 area, dtr with absent left bicipital reflex o/w symmetric, motor with 4+/5  Left distal motor o/w intact Skin: Skin is warm. No erythema.  Psychiatric: Pt behavior is normal. Thought content normal. mild nervous decrsaeded sens to left c4, absent left bicep reflex       Assessment & Plan:  Quality Measures addressed:  Mammogram:  pt declines and will self-refer Colorectal Cancer screening: pt declines all at this time, including FOBT, flex sig, colonoscopy

## 2013-07-12 NOTE — Assessment & Plan Note (Signed)
stable overall by history and exam, recent data reviewed with pt, and pt to continue medical treatment as before,  to f/u any worsening symptoms or concerns BP Readings from Last 3 Encounters:  07/11/13 122/80  11/25/12 114/70  09/20/12 104/60

## 2013-07-12 NOTE — Assessment & Plan Note (Signed)
Mild situational increase, will hold on any med change or counseling at this time

## 2013-07-12 NOTE — Assessment & Plan Note (Signed)
Mod to severe pain for 1 wk, for pain control, predpack asd, and refer NS, hold on MRI as she has pacemaker

## 2013-07-14 ENCOUNTER — Telehealth: Payer: Self-pay | Admitting: *Deleted

## 2013-07-14 MED ORDER — HYDROCODONE-ACETAMINOPHEN 5-325 MG PO TABS: 1.0000 | ORAL_TABLET | Freq: Four times a day (QID) | ORAL | Status: DC | PRN

## 2013-07-14 NOTE — Telephone Encounter (Signed)
Done hardcopy to robin  

## 2013-07-14 NOTE — Telephone Encounter (Signed)
I think she means codeine from before, and now oxycodone cause vomiting  Please confirm with pt  I can offer hydrocodone (which is different) at a lower dose if she wants

## 2013-07-14 NOTE — Telephone Encounter (Signed)
Called left msg to call back. 

## 2013-07-14 NOTE — Telephone Encounter (Signed)
Pt called states the pain medication makes her vomit.  Further states she vomits after taking both pain medications.  please advise.

## 2013-07-14 NOTE — Telephone Encounter (Signed)
Patient has been informed.

## 2013-07-14 NOTE — Telephone Encounter (Signed)
Called the patient and "yes" you are correct on both meds. Prescribed.  She also informed me that she also threw up the prednisone pill (she took at a different time from pain meds. As not to confuse).  She is ok to try something different

## 2013-07-14 NOTE — Telephone Encounter (Signed)
Faxed hardcopy to [facility]

## 2013-07-17 DIAGNOSIS — M5412 Radiculopathy, cervical region: Secondary | ICD-10-CM | POA: Diagnosis not present

## 2013-07-22 ENCOUNTER — Other Ambulatory Visit: Payer: Self-pay | Admitting: Neurosurgery

## 2013-07-22 DIAGNOSIS — M5412 Radiculopathy, cervical region: Secondary | ICD-10-CM

## 2013-07-24 ENCOUNTER — Ambulatory Visit
Admission: RE | Admit: 2013-07-24 | Discharge: 2013-07-24 | Disposition: A | Discharge status: Home / Self Care | Payer: Medicare Other | Source: Ambulatory Visit | Attending: Neurosurgery | Admitting: Neurosurgery

## 2013-07-24 DIAGNOSIS — M4802 Spinal stenosis, cervical region: Secondary | ICD-10-CM | POA: Diagnosis not present

## 2013-07-24 DIAGNOSIS — M5412 Radiculopathy, cervical region: Secondary | ICD-10-CM

## 2013-08-08 ENCOUNTER — Telehealth: Payer: Self-pay | Admitting: Internal Medicine

## 2013-08-08 MED ORDER — PREDNISONE 10 MG PO TABS: ORAL_TABLET | ORAL | Status: DC

## 2013-08-08 NOTE — Telephone Encounter (Signed)
Pt letter reviewed, requested repeat predpack as this seemed to help  Done per emr

## 2013-08-08 NOTE — Telephone Encounter (Signed)
Called the patient informed rx sent in. 

## 2013-08-12 ENCOUNTER — Ambulatory Visit (INDEPENDENT_AMBULATORY_CARE_PROVIDER_SITE_OTHER): Payer: Medicare Other | Admitting: Internal Medicine

## 2013-08-12 ENCOUNTER — Encounter: Payer: Self-pay | Admitting: Internal Medicine

## 2013-08-12 VITALS — BP 118/80 | HR 65 | Wt 129.0 lb

## 2013-08-12 DIAGNOSIS — I442 Atrioventricular block, complete: Secondary | ICD-10-CM

## 2013-08-12 DIAGNOSIS — Z950 Presence of cardiac pacemaker: Secondary | ICD-10-CM | POA: Diagnosis not present

## 2013-08-12 LAB — PACEMAKER DEVICE OBSERVATION
AL AMPLITUDE: 2.8 mv
AL THRESHOLD: 2 V
BAMS-0001: 150 {beats}/min

## 2013-08-12 NOTE — Progress Notes (Signed)
  HPI  Danielle [name] is a 77 y.o. female is seen in followup for complete heart block for which she is status post pacemaker.  She underwent device generator replacement 8/13; in discoloration of both leads were noted The patient denies chest pain, shortness of breath, nocturnal dyspnea, orthopnea or peripheral edema.  There have been no palpitations, lightheadedness or syncope.           This is her 4th procedure, initial implant followed by extraction in 1997 and generator replacement 2006  Past Medical History  Diagnosis Date  . NEPHROLITHIASIS, HX OF 01/09/2008  . DVT, HX OF 01/09/2008  . INSOMNIA-SLEEP DISORDER-UNSPEC 06/15/2009  . Dizziness and giddiness 02/25/2010  . OSTEOARTHRITIS, CERVICAL SPINE 02/25/2010  . GERD 01/09/2008  . ALLERGIC RHINITIS 01/09/2008  . ASTHMATIC BRONCHITIS, ACUTE 10/20/2008  . AV BLOCK, COMPLETE 12/16/2009  . HYPERTENSION 01/09/2008  . DEPRESSION 01/09/2008  . ANXIETY 01/09/2008  . ANEMIA-NOS 01/09/2008  . HYPERLIPIDEMIA 01/09/2008  . GLUCOSE INTOLERANCE 01/09/2008  . HYPOTHYROIDISM 01/09/2008  . Impaired glucose tolerance 07/28/2011    Past Surgical History  Procedure Laterality Date  . Pacemaker placement      MedTronic EnPulse [id number]  . Ercp w/ sphicterotomy    . Hemorrhoid surgery    . Polypectomy      Uterine    Current Outpatient Prescriptions  Medication Sig Dispense Refill  . aspirin 81 MG EC tablet Take 81 mg by mouth daily.        Marland Kitchen atenolol (TENORMIN) 50 MG tablet Take 50 mg by mouth daily.      Marland Kitchen atorvastatin (LIPITOR) 40 MG tablet Take 40 mg by mouth daily.      Marland Kitchen azelastine (ASTELIN) 137 MCG/SPRAY nasal spray Place 1 spray into the nose 2 (two) times daily. Use in each nostril as directed      . enalapril (VASOTEC) 10 MG tablet TAKE 1 TABLET BY MOUTH ONCE DAILY  90 tablet  3  . esomeprazole (NEXIUM) 40 MG capsule Take 1 capsule (40 mg total) by mouth daily.  30 capsule  11  . HYDROcodone-acetaminophen (NORCO/VICODIN) 5-325 MG  per tablet Take 1 tablet by mouth every 6 (six) hours as needed for pain.  60 tablet  1  . HYDROcodone-homatropine (HYCODAN) 5-1.5 MG/5ML syrup Take 5 mLs by mouth every 6 (six) hours as needed for cough.  120 mL  1  . levothyroxine (SYNTHROID, LEVOTHROID) 112 MCG tablet take 1 tablet by mouth once daily  90 tablet  3  . omeprazole (PRILOSEC) 20 MG capsule Take 20 mg by mouth daily.      Marland Kitchen zolpidem (AMBIEN CR) 12.5 MG CR tablet take 1 tablet by mouth at bedtime if needed  30 tablet  5   No current facility-administered medications for this visit.    Allergies  Allergen Reactions  . Codeine Nausea And Vomiting  . Oxycodone Nausea And Vomiting    Review of Systems negative except from HPI and PMH  Physical Exam BP 118/80  Pulse 65  Wt 129 lb (58.514 kg)  BMI 22.13 kg/m2 Well developed and well nourished in no acute distress HENT normal E scleral and icterus clear Neck Supple JVP flat; carotids brisk and full Clear to ausculation Device pocket well healed; without hematoma or erythema Regular rate and rhythm, Soft with active bowel sounds No clubbing cyanosis no Edema Alert and oriented, grossly normal motor and sensory function Skin Warm and Dry    Assessment and  Plan

## 2013-08-12 NOTE — Assessment & Plan Note (Signed)
The patient's device was interrogated.  The information was reviewed. No changes were made in the programming.    

## 2013-08-12 NOTE — Assessment & Plan Note (Addendum)
Stable post pacing 

## 2013-08-12 NOTE — Patient Instructions (Addendum)
Remote monitoring is used to monitor your Pacemaker of ICD from home. This monitoring reduces the number of office visits required to check your device to one time per year. It allows [name] to keep an eye on the functioning of your device to ensure it is working properly. You are scheduled for a device check from home on 11/17/13. You may send your transmission at any time that day. If you have a wireless device, the transmission will be sent automatically. After your physician reviews your transmission, you will receive a postcard with your next transmission date.  Your physician wants you to follow-up in: ONE YEAR WITH DR Graciela Husbands. You will receive a reminder letter in the mail two months in advance. If you don't receive a letter, please call our office to schedule the follow-up appointment.  Your physician recommends that you continue on your current medications as directed. Please refer to the Current Medication list given to you today.

## 2013-08-14 DIAGNOSIS — M47812 Spondylosis without myelopathy or radiculopathy, cervical region: Secondary | ICD-10-CM | POA: Diagnosis not present

## 2013-08-22 ENCOUNTER — Encounter: Payer: Self-pay | Admitting: Internal Medicine

## 2013-08-22 DIAGNOSIS — M542 Cervicalgia: Secondary | ICD-10-CM | POA: Diagnosis not present

## 2013-09-08 DIAGNOSIS — M5412 Radiculopathy, cervical region: Secondary | ICD-10-CM | POA: Diagnosis not present

## 2013-09-08 DIAGNOSIS — M542 Cervicalgia: Secondary | ICD-10-CM | POA: Diagnosis not present

## 2013-09-08 DIAGNOSIS — Q7649 Other congenital malformations of spine, not associated with scoliosis: Secondary | ICD-10-CM | POA: Diagnosis not present

## 2013-09-11 DIAGNOSIS — M5412 Radiculopathy, cervical region: Secondary | ICD-10-CM | POA: Diagnosis not present

## 2013-09-11 DIAGNOSIS — M542 Cervicalgia: Secondary | ICD-10-CM | POA: Diagnosis not present

## 2013-09-11 DIAGNOSIS — Q7649 Other congenital malformations of spine, not associated with scoliosis: Secondary | ICD-10-CM | POA: Diagnosis not present

## 2013-09-16 DIAGNOSIS — M542 Cervicalgia: Secondary | ICD-10-CM | POA: Diagnosis not present

## 2013-09-16 DIAGNOSIS — Q7649 Other congenital malformations of spine, not associated with scoliosis: Secondary | ICD-10-CM | POA: Diagnosis not present

## 2013-09-16 DIAGNOSIS — M5412 Radiculopathy, cervical region: Secondary | ICD-10-CM | POA: Diagnosis not present

## 2013-09-18 DIAGNOSIS — M5412 Radiculopathy, cervical region: Secondary | ICD-10-CM | POA: Diagnosis not present

## 2013-09-18 DIAGNOSIS — M542 Cervicalgia: Secondary | ICD-10-CM | POA: Diagnosis not present

## 2013-09-18 DIAGNOSIS — Q7649 Other congenital malformations of spine, not associated with scoliosis: Secondary | ICD-10-CM | POA: Diagnosis not present

## 2013-09-23 DIAGNOSIS — M5412 Radiculopathy, cervical region: Secondary | ICD-10-CM | POA: Diagnosis not present

## 2013-09-23 DIAGNOSIS — Q7649 Other congenital malformations of spine, not associated with scoliosis: Secondary | ICD-10-CM | POA: Diagnosis not present

## 2013-09-23 DIAGNOSIS — M542 Cervicalgia: Secondary | ICD-10-CM | POA: Diagnosis not present

## 2013-09-24 DIAGNOSIS — J3089 Other allergic rhinitis: Secondary | ICD-10-CM | POA: Diagnosis not present

## 2013-09-24 DIAGNOSIS — J3081 Allergic rhinitis due to animal (cat) (dog) hair and dander: Secondary | ICD-10-CM | POA: Diagnosis not present

## 2013-09-25 ENCOUNTER — Other Ambulatory Visit: Payer: Self-pay

## 2013-10-21 DIAGNOSIS — L538 Other specified erythematous conditions: Secondary | ICD-10-CM | POA: Diagnosis not present

## 2013-10-21 DIAGNOSIS — Z85828 Personal history of other malignant neoplasm of skin: Secondary | ICD-10-CM | POA: Diagnosis not present

## 2013-10-21 DIAGNOSIS — L723 Sebaceous cyst: Secondary | ICD-10-CM | POA: Diagnosis not present

## 2013-10-21 DIAGNOSIS — L570 Actinic keratosis: Secondary | ICD-10-CM | POA: Diagnosis not present

## 2013-11-10 DIAGNOSIS — IMO0002 Reserved for concepts with insufficient information to code with codable children: Secondary | ICD-10-CM | POA: Diagnosis not present

## 2013-11-10 DIAGNOSIS — M999 Biomechanical lesion, unspecified: Secondary | ICD-10-CM | POA: Diagnosis not present

## 2013-11-10 DIAGNOSIS — M9981 Other biomechanical lesions of cervical region: Secondary | ICD-10-CM | POA: Diagnosis not present

## 2013-11-10 DIAGNOSIS — M5020 Other cervical disc displacement, unspecified cervical region: Secondary | ICD-10-CM | POA: Diagnosis not present

## 2013-11-12 DIAGNOSIS — M5020 Other cervical disc displacement, unspecified cervical region: Secondary | ICD-10-CM | POA: Diagnosis not present

## 2013-11-12 DIAGNOSIS — IMO0002 Reserved for concepts with insufficient information to code with codable children: Secondary | ICD-10-CM | POA: Diagnosis not present

## 2013-11-12 DIAGNOSIS — M999 Biomechanical lesion, unspecified: Secondary | ICD-10-CM | POA: Diagnosis not present

## 2013-11-12 DIAGNOSIS — M9981 Other biomechanical lesions of cervical region: Secondary | ICD-10-CM | POA: Diagnosis not present

## 2013-11-17 ENCOUNTER — Encounter: Payer: Medicare Other | Admitting: *Deleted

## 2013-11-21 DIAGNOSIS — M999 Biomechanical lesion, unspecified: Secondary | ICD-10-CM | POA: Diagnosis not present

## 2013-11-21 DIAGNOSIS — M9981 Other biomechanical lesions of cervical region: Secondary | ICD-10-CM | POA: Diagnosis not present

## 2013-11-21 DIAGNOSIS — M5020 Other cervical disc displacement, unspecified cervical region: Secondary | ICD-10-CM | POA: Diagnosis not present

## 2013-11-21 DIAGNOSIS — IMO0002 Reserved for concepts with insufficient information to code with codable children: Secondary | ICD-10-CM | POA: Diagnosis not present

## 2013-11-24 DIAGNOSIS — M999 Biomechanical lesion, unspecified: Secondary | ICD-10-CM | POA: Diagnosis not present

## 2013-11-24 DIAGNOSIS — M9981 Other biomechanical lesions of cervical region: Secondary | ICD-10-CM | POA: Diagnosis not present

## 2013-11-24 DIAGNOSIS — IMO0002 Reserved for concepts with insufficient information to code with codable children: Secondary | ICD-10-CM | POA: Diagnosis not present

## 2013-11-24 DIAGNOSIS — M5020 Other cervical disc displacement, unspecified cervical region: Secondary | ICD-10-CM | POA: Diagnosis not present

## 2013-11-26 DIAGNOSIS — M999 Biomechanical lesion, unspecified: Secondary | ICD-10-CM | POA: Diagnosis not present

## 2013-11-26 DIAGNOSIS — M5020 Other cervical disc displacement, unspecified cervical region: Secondary | ICD-10-CM | POA: Diagnosis not present

## 2013-11-26 DIAGNOSIS — IMO0002 Reserved for concepts with insufficient information to code with codable children: Secondary | ICD-10-CM | POA: Diagnosis not present

## 2013-11-26 DIAGNOSIS — M9981 Other biomechanical lesions of cervical region: Secondary | ICD-10-CM | POA: Diagnosis not present

## 2013-11-27 ENCOUNTER — Encounter: Payer: Self-pay | Admitting: *Deleted

## 2013-11-28 DIAGNOSIS — M999 Biomechanical lesion, unspecified: Secondary | ICD-10-CM | POA: Diagnosis not present

## 2013-11-28 DIAGNOSIS — M9981 Other biomechanical lesions of cervical region: Secondary | ICD-10-CM | POA: Diagnosis not present

## 2013-11-28 DIAGNOSIS — M5020 Other cervical disc displacement, unspecified cervical region: Secondary | ICD-10-CM | POA: Diagnosis not present

## 2013-11-28 DIAGNOSIS — IMO0002 Reserved for concepts with insufficient information to code with codable children: Secondary | ICD-10-CM | POA: Diagnosis not present

## 2013-12-02 ENCOUNTER — Other Ambulatory Visit: Payer: Self-pay | Admitting: Internal Medicine

## 2014-03-19 DIAGNOSIS — H3560 Retinal hemorrhage, unspecified eye: Secondary | ICD-10-CM | POA: Diagnosis not present

## 2014-03-19 DIAGNOSIS — H43819 Vitreous degeneration, unspecified eye: Secondary | ICD-10-CM | POA: Diagnosis not present

## 2014-03-19 DIAGNOSIS — H2510 Age-related nuclear cataract, unspecified eye: Secondary | ICD-10-CM | POA: Diagnosis not present

## 2014-03-19 DIAGNOSIS — H40019 Open angle with borderline findings, low risk, unspecified eye: Secondary | ICD-10-CM | POA: Diagnosis not present

## 2014-06-15 ENCOUNTER — Other Ambulatory Visit: Payer: Self-pay | Admitting: Internal Medicine

## 2014-07-28 ENCOUNTER — Encounter: Payer: Self-pay | Admitting: Internal Medicine

## 2014-07-28 ENCOUNTER — Ambulatory Visit (INDEPENDENT_AMBULATORY_CARE_PROVIDER_SITE_OTHER): Payer: Medicare Other | Admitting: Internal Medicine

## 2014-07-28 VITALS — BP 134/78 | HR 83 | Ht 64.0 in | Wt 127.4 lb

## 2014-07-28 DIAGNOSIS — I442 Atrioventricular block, complete: Secondary | ICD-10-CM

## 2014-07-28 DIAGNOSIS — Z950 Presence of cardiac pacemaker: Secondary | ICD-10-CM | POA: Diagnosis not present

## 2014-07-28 LAB — MDC_IDC_ENUM_SESS_TYPE_INCLINIC
Battery Impedance: 156 Ohm
Battery Remaining Longevity: 99 mo
Battery Voltage: 2.78 V
Brady Statistic AP VP Percent: 63 %
Brady Statistic AS VP Percent: 37 %
Lead Channel Impedance Value: 681 Ohm
Lead Channel Pacing Threshold Amplitude: 1 V
Lead Channel Sensing Intrinsic Amplitude: 2 mV
Lead Channel Setting Pacing Amplitude: 2.5 V
Lead Channel Setting Pacing Amplitude: 2.5 V
Lead Channel Setting Pacing Pulse Width: 0.76 ms
MDC IDC MSMT LEADCHNL RA PACING THRESHOLD AMPLITUDE: 1.25 V
MDC IDC MSMT LEADCHNL RA PACING THRESHOLD PULSEWIDTH: 0.76 ms
MDC IDC MSMT LEADCHNL RV IMPEDANCE VALUE: 878 Ohm
MDC IDC MSMT LEADCHNL RV PACING THRESHOLD PULSEWIDTH: 0.76 ms
MDC IDC SESS DTM: 20150908142325
MDC IDC SET LEADCHNL RV SENSING SENSITIVITY: 5.6 mV
MDC IDC STAT BRADY AP VS PERCENT: 0 %
MDC IDC STAT BRADY AS VS PERCENT: 0 %

## 2014-07-28 NOTE — Progress Notes (Signed)
      Patient Care Team: Biagio Borg, MD as PCP - General   HPI  Danielle [name] is a 78 y.o. female is seen in followup for complete heart block for which she is status post pacemaker.  She underwent device generator replacement 8/13; in discoloration of both leads were noted  The patient denies chest pain, shortness of breath, nocturnal dyspnea, orthopnea or peripheral edema. There have been no palpitations, lightheadedness or syncope.    Past Medical History  Diagnosis Date  . NEPHROLITHIASIS, HX OF 01/09/2008  . DVT, HX OF 01/09/2008  . INSOMNIA-SLEEP DISORDER-UNSPEC 06/15/2009  . Dizziness and giddiness 02/25/2010  . OSTEOARTHRITIS, CERVICAL SPINE 02/25/2010  . GERD 01/09/2008  . ALLERGIC RHINITIS 01/09/2008  . ASTHMATIC BRONCHITIS, ACUTE 10/20/2008  . AV BLOCK, COMPLETE 12/16/2009  . HYPERTENSION 01/09/2008  . DEPRESSION 01/09/2008  . ANXIETY 01/09/2008  . ANEMIA-NOS 01/09/2008  . HYPERLIPIDEMIA 01/09/2008  . GLUCOSE INTOLERANCE 01/09/2008  . HYPOTHYROIDISM 01/09/2008  . Impaired glucose tolerance 07/28/2011    Past Surgical History  Procedure Laterality Date  . Pacemaker placement      MedTronic EnPulse [id number]  . Ercp w/ sphicterotomy    . Hemorrhoid surgery    . Polypectomy      Uterine    Current Outpatient Prescriptions  Medication Sig Dispense Refill  . aspirin 81 MG EC tablet Take 81 mg by mouth daily.        Marland Kitchen atenolol (TENORMIN) 50 MG tablet Take 50 mg by mouth daily.      Marland Kitchen atorvastatin (LIPITOR) 40 MG tablet Take 40 mg by mouth daily.      Marland Kitchen azelastine (ASTELIN) 137 MCG/SPRAY nasal spray Place 1 spray into the nose 2 (two) times daily. Use in each nostril as directed      . enalapril (VASOTEC) 10 MG tablet take 1 tablet by mouth once daily  90 tablet  3  . esomeprazole (NEXIUM) 40 MG capsule Take 1 capsule (40 mg total) by mouth daily.  30 capsule  11  . levothyroxine (SYNTHROID, LEVOTHROID) 112 MCG tablet take 1 tablet by mouth once daily  30 tablet  4  .  omeprazole (PRILOSEC) 20 MG capsule Take 20 mg by mouth daily.      Marland Kitchen zolpidem (AMBIEN CR) 12.5 MG CR tablet take 1 tablet by mouth at bedtime if needed  30 tablet  5   No current facility-administered medications for this visit.    Allergies  Allergen Reactions  . Codeine Nausea And Vomiting  . Oxycodone Nausea And Vomiting    Review of Systems negative except from HPI and PMH  Physical Exam BP 134/78  Pulse 83  Ht 5\' 4"  (1.626 m)  Wt 127 lb 6.4 oz (57.788 kg)  BMI 21.86 kg/m2 Well developed and nourished in no acute distress HENT normal Neck supple with JVP-flat Clear Regular rate and rhythm, no murmurs or gallops Abd-soft with active BS No Clubbing cyanosis edema Skin-warm and dry A & Oriented  Grossly normal sensory and motor function    Assessment and  Plan  Complete heart block   Pacemaker  Medtronic  The patient's device was interrogated.  The information was reviewed.  Changes were made to reduce the output voltage it is there is a pulse width. This should improve longevity

## 2014-07-28 NOTE — Patient Instructions (Addendum)
Your physician recommends that you continue on your current medications as directed. Please refer to the Current Medication list given to you today.  Remote monitoring is used to monitor your pacemaker from home. This monitoring reduces the number of office visits required to check your device to one time per year. It allows [name] to keep an eye on the functioning of your device to ensure it is working properly. You are scheduled for a device check from home on 10-29-2014. You may send your transmission at any time that day. If you have a wireless device, the transmission will be sent automatically. After your physician reviews your transmission, you will receive a postcard with your next transmission date.  Your physician recommends that you schedule a follow-up appointment in:12 months with Dr.Klein

## 2014-08-24 DIAGNOSIS — H9203 Otalgia, bilateral: Secondary | ICD-10-CM | POA: Diagnosis not present

## 2014-08-24 DIAGNOSIS — J3081 Allergic rhinitis due to animal (cat) (dog) hair and dander: Secondary | ICD-10-CM | POA: Diagnosis not present

## 2014-08-24 DIAGNOSIS — J3089 Other allergic rhinitis: Secondary | ICD-10-CM | POA: Diagnosis not present

## 2014-10-26 DIAGNOSIS — M542 Cervicalgia: Secondary | ICD-10-CM | POA: Diagnosis not present

## 2014-10-26 DIAGNOSIS — M75111 Incomplete rotator cuff tear or rupture of right shoulder, not specified as traumatic: Secondary | ICD-10-CM | POA: Diagnosis not present

## 2014-10-26 DIAGNOSIS — M5412 Radiculopathy, cervical region: Secondary | ICD-10-CM | POA: Diagnosis not present

## 2014-10-29 ENCOUNTER — Telehealth: Payer: Self-pay | Admitting: Cardiology

## 2014-10-29 ENCOUNTER — Encounter (HOSPITAL_COMMUNITY): Payer: Self-pay | Admitting: Internal Medicine

## 2014-10-29 ENCOUNTER — Ambulatory Visit (INDEPENDENT_AMBULATORY_CARE_PROVIDER_SITE_OTHER): Payer: Medicare Other | Admitting: *Deleted

## 2014-10-29 DIAGNOSIS — I442 Atrioventricular block, complete: Secondary | ICD-10-CM | POA: Diagnosis not present

## 2014-10-29 NOTE — Progress Notes (Signed)
Remote pacemaker transmission.   

## 2014-10-29 NOTE — Telephone Encounter (Signed)
Spoke with pt and reminded pt of remote transmission that is due today. Pt verbalized understanding.   

## 2014-11-02 LAB — MDC_IDC_ENUM_SESS_TYPE_REMOTE
Battery Voltage: 2.78 V
Brady Statistic AP VS Percent: 0 %
Brady Statistic AS VS Percent: 0 %
Lead Channel Pacing Threshold Amplitude: 1.375 V
Lead Channel Pacing Threshold Amplitude: 2.25 V
Lead Channel Pacing Threshold Pulse Width: 0.4 ms
Lead Channel Setting Pacing Pulse Width: 0.4 ms
Lead Channel Setting Sensing Sensitivity: 5.6 mV
MDC IDC MSMT BATTERY IMPEDANCE: 180 Ohm
MDC IDC MSMT BATTERY REMAINING LONGEVITY: 95 mo
MDC IDC MSMT LEADCHNL RA IMPEDANCE VALUE: 774 Ohm
MDC IDC MSMT LEADCHNL RA PACING THRESHOLD PULSEWIDTH: 0.4 ms
MDC IDC MSMT LEADCHNL RA SENSING INTR AMPL: 2.8 mV
MDC IDC MSMT LEADCHNL RV IMPEDANCE VALUE: 914 Ohm
MDC IDC SESS DTM: 20151210200129
MDC IDC SET LEADCHNL RA PACING AMPLITUDE: 4.5 V
MDC IDC SET LEADCHNL RV PACING AMPLITUDE: 2.75 V
MDC IDC STAT BRADY AP VP PERCENT: 70 %
MDC IDC STAT BRADY AS VP PERCENT: 30 %

## 2014-11-05 ENCOUNTER — Encounter: Payer: Self-pay | Admitting: Cardiology

## 2014-11-09 DIAGNOSIS — M7541 Impingement syndrome of right shoulder: Secondary | ICD-10-CM | POA: Diagnosis not present

## 2014-11-09 DIAGNOSIS — M5412 Radiculopathy, cervical region: Secondary | ICD-10-CM | POA: Diagnosis not present

## 2014-11-16 ENCOUNTER — Encounter: Payer: Self-pay | Admitting: Internal Medicine

## 2014-11-19 ENCOUNTER — Encounter: Payer: Self-pay | Admitting: Cardiology

## 2014-12-06 ENCOUNTER — Other Ambulatory Visit: Payer: Self-pay | Admitting: Internal Medicine

## 2014-12-14 ENCOUNTER — Telehealth: Payer: Self-pay | Admitting: Internal Medicine

## 2014-12-14 NOTE — Telephone Encounter (Signed)
Patient is requesting a 5 or 7 day RX for these denied meds. We are scheduling her CPE, but she is "iced" in to her home as her road is not serviced for scrapers   enalapril (VASOTEC) 10 MG tablet [Pharmacy Med Name: ENALAPRIL MALEATE 10 MG TAB] [[id number]]  atenolol (TENORMIN) 50 MG tablet [[id number]]

## 2014-12-15 MED ORDER — ENALAPRIL MALEATE 10 MG PO TABS: 10.0000 mg | ORAL_TABLET | Freq: Every day | ORAL | Status: DC

## 2014-12-15 MED ORDER — ATENOLOL 50 MG PO TABS: 50.0000 mg | ORAL_TABLET | Freq: Every day | ORAL | Status: DC

## 2014-12-15 MED ORDER — ATORVASTATIN CALCIUM 40 MG PO TABS: 40.0000 mg | ORAL_TABLET | Freq: Every day | ORAL | Status: DC

## 2014-12-18 ENCOUNTER — Encounter: Payer: Self-pay | Admitting: Internal Medicine

## 2014-12-18 ENCOUNTER — Other Ambulatory Visit (INDEPENDENT_AMBULATORY_CARE_PROVIDER_SITE_OTHER): Payer: Medicare Other

## 2014-12-18 ENCOUNTER — Ambulatory Visit (INDEPENDENT_AMBULATORY_CARE_PROVIDER_SITE_OTHER): Payer: Medicare Other | Admitting: Internal Medicine

## 2014-12-18 VITALS — BP 142/92 | HR 80 | Temp 98.1°F | Ht 64.0 in | Wt 132.0 lb

## 2014-12-18 DIAGNOSIS — E785 Hyperlipidemia, unspecified: Secondary | ICD-10-CM

## 2014-12-18 DIAGNOSIS — E039 Hypothyroidism, unspecified: Secondary | ICD-10-CM

## 2014-12-18 DIAGNOSIS — Z23 Encounter for immunization: Secondary | ICD-10-CM | POA: Diagnosis not present

## 2014-12-18 DIAGNOSIS — I10 Essential (primary) hypertension: Secondary | ICD-10-CM | POA: Diagnosis not present

## 2014-12-18 DIAGNOSIS — F329 Major depressive disorder, single episode, unspecified: Secondary | ICD-10-CM

## 2014-12-18 DIAGNOSIS — F32A Depression, unspecified: Secondary | ICD-10-CM

## 2014-12-18 LAB — CBC WITH DIFFERENTIAL/PLATELET
Basophils Absolute: 0 10*3/uL (ref 0.0–0.1)
Basophils Relative: 0.6 % (ref 0.0–3.0)
EOS PCT: 1.9 % (ref 0.0–5.0)
Eosinophils Absolute: 0.1 10*3/uL (ref 0.0–0.7)
HEMATOCRIT: 37.8 % (ref 36.0–46.0)
Hemoglobin: 13.1 g/dL (ref 12.0–15.0)
LYMPHS PCT: 31.6 % (ref 12.0–46.0)
Lymphs Abs: 1.6 10*3/uL (ref 0.7–4.0)
MCHC: 34.8 g/dL (ref 30.0–36.0)
MCV: 96.2 fl (ref 78.0–100.0)
Monocytes Absolute: 0.5 10*3/uL (ref 0.1–1.0)
Monocytes Relative: 10.2 % (ref 3.0–12.0)
NEUTROS ABS: 2.9 10*3/uL (ref 1.4–7.7)
NEUTROS PCT: 55.7 % (ref 43.0–77.0)
Platelets: 211 10*3/uL (ref 150.0–400.0)
RBC: 3.93 Mil/uL (ref 3.87–5.11)
RDW: 14.7 % (ref 11.5–15.5)
WBC: 5.1 10*3/uL (ref 4.0–10.5)

## 2014-12-18 LAB — BASIC METABOLIC PANEL
BUN: 30 mg/dL — ABNORMAL HIGH (ref 6–23)
CALCIUM: 9.5 mg/dL (ref 8.4–10.5)
CHLORIDE: 105 meq/L (ref 96–112)
CO2: 25 meq/L (ref 19–32)
CREATININE: 0.9 mg/dL (ref 0.40–1.20)
GFR: 64.52 mL/min (ref >60.00)
Glucose, Bld: 95 mg/dL (ref 70–99)
Potassium: 3.8 mEq/L (ref 3.5–5.1)
SODIUM: 137 meq/L (ref 135–145)

## 2014-12-18 LAB — URINALYSIS, ROUTINE W REFLEX MICROSCOPIC
Bilirubin Urine: NEGATIVE
Hgb urine dipstick: NEGATIVE
KETONES UR: NEGATIVE
LEUKOCYTES UA: NEGATIVE
NITRITE: NEGATIVE
PH: 5.5 (ref 5.0–8.0)
TOTAL PROTEIN, URINE-UPE24: NEGATIVE
Urine Glucose: NEGATIVE
Urobilinogen, UA: 0.2 (ref 0.0–1.0)

## 2014-12-18 LAB — HEPATIC FUNCTION PANEL
ALT: 12 U/L (ref 0–35)
AST: 19 U/L (ref 0–37)
Albumin: 4.2 g/dL (ref 3.5–5.2)
Alkaline Phosphatase: 69 U/L (ref 39–117)
Bilirubin, Direct: 0.1 mg/dL (ref 0.0–0.3)
Total Bilirubin: 0.8 mg/dL (ref 0.2–1.2)
Total Protein: 6.5 g/dL (ref 6.0–8.3)

## 2014-12-18 LAB — LIPID PANEL
Cholesterol: 291 mg/dL — ABNORMAL HIGH (ref 0–200)
HDL: 115.4 mg/dL (ref >39.00)
LDL CALC: 137 mg/dL — AB (ref 0–99)
NONHDL: 175.6
Total CHOL/HDL Ratio: 3
Triglycerides: 191 mg/dL — ABNORMAL HIGH (ref 0.0–149.0)
VLDL: 38.2 mg/dL (ref 0.0–40.0)

## 2014-12-18 LAB — TSH: TSH: 24.43 u[IU]/mL — AB (ref 0.35–4.50)

## 2014-12-18 MED ORDER — ENALAPRIL MALEATE 10 MG PO TABS: 10.0000 mg | ORAL_TABLET | Freq: Every day | ORAL | Status: DC

## 2014-12-18 MED ORDER — ZOLPIDEM TARTRATE ER 12.5 MG PO TBCR: EXTENDED_RELEASE_TABLET | ORAL | Status: DC

## 2014-12-18 MED ORDER — ATORVASTATIN CALCIUM 40 MG PO TABS: 40.0000 mg | ORAL_TABLET | Freq: Every day | ORAL | Status: DC

## 2014-12-18 MED ORDER — ATENOLOL 50 MG PO TABS: 50.0000 mg | ORAL_TABLET | Freq: Every day | ORAL | Status: DC

## 2014-12-18 MED ORDER — AZELASTINE HCL 0.1 % NA SOLN
1.0000 | Freq: Two times a day (BID) | NASAL | Status: DC
Start: 2014-12-18 — End: 2019-12-24

## 2014-12-18 MED ORDER — OMEPRAZOLE 20 MG PO CPDR: 20.0000 mg | DELAYED_RELEASE_CAPSULE | Freq: Every day | ORAL | Status: DC

## 2014-12-18 MED ORDER — LEVOTHYROXINE SODIUM 112 MCG PO TABS: 112.0000 ug | ORAL_TABLET | Freq: Every day | ORAL | Status: DC

## 2014-12-18 NOTE — Progress Notes (Signed)
Pre visit review using our clinic review tool, if applicable. No additional management support is needed unless otherwise documented below in the visit note. 

## 2014-12-18 NOTE — Patient Instructions (Addendum)
You had the new Prevnar pneumonia shot today  Please continue all other medications as before, and refills have been done if requested. - the ambien  Please have the pharmacy call with any other refills you may need.  Please continue your efforts at being more active, low cholesterol diet, and weight control.  You are otherwise up to date with prevention measures today.  Please keep your appointments with your specialists as you may have planned  Please remember to followup with your GYN for the yearly  Mammogram at [facility]  Please go to the LAB in the Basement (turn left off the elevator) for the tests to be done today  You will be contacted by phone if any changes need to be made immediately.  Otherwise, you will receive a letter about your results with an explanation, but please check with MyChart first.  Please remember to sign up for MyChart if you have not done so, as this will be important to you in the future with finding out test results, communicating by private email, and scheduling acute appointments online when needed.  Please return in 1 year for your yearly visit, or sooner if needed, with Lab testing done 3-5 days before  To Log into MyChart, please go to [email or web address], and your Username is: [email or web address]

## 2014-12-18 NOTE — Progress Notes (Signed)
Subjective:    Patient ID: Danielle [name], female    DOB: 03/02/1936, 79 y.o.   MRN: [id number]  HPI  Here for yearly f/u;  Overall doing ok;  Pt denies CP, worsening SOB, DOE, wheezing, orthopnea, PND, worsening LE edema, palpitations, dizziness or syncope.  Pt denies neurological change such as new headache, facial or extremity weakness.  Pt denies polydipsia, polyuria, or low sugar symptoms. Pt states overall good compliance with treatment and medications, good tolerability, and has been trying to follow lower cholesterol diet.  Pt denies worsening depressive symptoms, suicidal ideation or panic. No fever, night sweats, wt loss, loss of appetite, or other constitutional symptoms.  Pt states good ability with ADL's, has low fall risk, home safety reviewed and adequate, no other significant changes in hearing or vision, and only rarely active with exercise. No current complaints  Due for prevnar. Denies hyper or hypo thyroid symptoms such as voice, skin or hair change. Past Medical History  Diagnosis Date  . NEPHROLITHIASIS, HX OF 01/09/2008  . DVT, HX OF 01/09/2008  . INSOMNIA-SLEEP DISORDER-UNSPEC 06/15/2009  . Dizziness and giddiness 02/25/2010  . OSTEOARTHRITIS, CERVICAL SPINE 02/25/2010  . GERD 01/09/2008  . ALLERGIC RHINITIS 01/09/2008  . ASTHMATIC BRONCHITIS, ACUTE 10/20/2008  . AV BLOCK, COMPLETE 12/16/2009  . HYPERTENSION 01/09/2008  . DEPRESSION 01/09/2008  . ANXIETY 01/09/2008  . ANEMIA-NOS 01/09/2008  . HYPERLIPIDEMIA 01/09/2008  . GLUCOSE INTOLERANCE 01/09/2008  . HYPOTHYROIDISM 01/09/2008  . Impaired glucose tolerance 07/28/2011   Past Surgical History  Procedure Laterality Date  . Pacemaker placement      MedTronic EnPulse [id number]  . Ercp w/ sphicterotomy    . Hemorrhoid surgery    . Polypectomy      Uterine  . Permanent pacemaker generator change N/A 07/16/2012    Procedure: PERMANENT PACEMAKER GENERATOR CHANGE;  Surgeon: Deboraha Sprang, MD;  Location: [facility] CATH LAB;  Service:  Cardiovascular;  Laterality: N/A;    reports that she quit smoking about 43 years ago. Her smoking use included Cigarettes. She has a 5 pack-year smoking history. She does not have any smokeless tobacco history on file. She reports that she drinks alcohol. Her drug history is not on file. family history includes Cancer in her mother; Pulmonary embolism in her father; Sudden death in her mother. Allergies  Allergen Reactions  . Codeine Nausea And Vomiting  . Oxycodone Nausea And Vomiting   Current Outpatient Prescriptions on File Prior to Visit  Medication Sig Dispense Refill  . aspirin 81 MG EC tablet Take 81 mg by mouth daily.      Marland Kitchen esomeprazole (NEXIUM) 40 MG capsule Take 1 capsule (40 mg total) by mouth daily. 30 capsule 11   No current facility-administered medications on file prior to visit.    Review of Systems Constitutional: Negative for increased diaphoresis, other activity, appetite or other siginficant weight change  HENT: Negative for worsening hearing loss, ear pain, facial swelling, mouth sores and neck stiffness.   Eyes: Negative for other worsening pain, redness or visual disturbance.  Respiratory: Negative for shortness of breath and wheezing.   Cardiovascular: Negative for chest pain and palpitations.  Gastrointestinal: Negative for diarrhea, blood in stool, abdominal distention or other pain Genitourinary: Negative for hematuria, flank pain or change in urine volume.  Musculoskeletal: Negative for myalgias or other joint complaints.  Skin: Negative for color change and wound.  Neurological: Negative for syncope and numbness. other than noted Hematological: Negative for adenopathy. or other swelling Psychiatric/Behavioral: Negative  for hallucinations, self-injury, decreased concentration or other worsening agitation.      Objective:   Physical Exam BP 142/92 mmHg  Pulse 80  Temp(Src) 98.1 F (36.7 C) (Oral)  Ht 5\' 4"  (1.626 m)  Wt 132 lb (59.875 kg)  BMI  22.65 kg/m2  SpO2 99% VS noted,  Constitutional: Pt is oriented to person, place, and time. Appears well-developed and well-nourished.  Head: Normocephalic and atraumatic.  Right Ear: External ear normal.  Left Ear: External ear normal.  Nose: Nose normal.  Mouth/Throat: Oropharynx is clear and moist.  Eyes: Conjunctivae and EOM are normal. Pupils are equal, round, and reactive to light.  Neck: Normal range of motion. Neck supple. No JVD present. No tracheal deviation present.  Cardiovascular: Normal rate, regular rhythm, normal heart sounds and intact distal pulses.   Pulmonary/Chest: Effort normal and breath sounds without rales or wheezing  Abdominal: Soft. Bowel sounds are normal. NT. No HSM  Musculoskeletal: Normal range of motion. Exhibits no edema.  Lymphadenopathy:  Has no cervical adenopathy.  Neurological: Pt is alert and oriented to person, place, and time. Pt has normal reflexes. No cranial nerve deficit. Motor grossly intact Skin: Skin is warm and dry. No rash noted.  Psychiatric:  Has mild nervous mood and affect. Behavior is normal.     Assessment & Plan:

## 2014-12-19 NOTE — Assessment & Plan Note (Signed)
stable overall by history and exam, recent data reviewed with pt, and pt to continue medical treatment as before,  to f/u any worsening symptoms or concerns Lab Results  Component Value Date   WBC 5.1 12/18/2014   HGB 13.1 12/18/2014   HCT 37.8 12/18/2014   PLT 211.0 12/18/2014   GLUCOSE 95 12/18/2014   CHOL 291* 12/18/2014   TRIG 191.0* 12/18/2014   HDL 115.40 12/18/2014   LDLDIRECT 136.1 07/31/2011   LDLCALC 137* 12/18/2014   ALT 12 12/18/2014   AST 19 12/18/2014   NA 137 12/18/2014   K 3.8 12/18/2014   CL 105 12/18/2014   CREATININE 0.90 12/18/2014   BUN 30* 12/18/2014   CO2 25 12/18/2014   TSH 24.43* 12/18/2014   INR 1.01 07/16/2012   HGBA1C 5.4 07/31/2011

## 2014-12-19 NOTE — Assessment & Plan Note (Signed)
stable overall by history and exam, recent data reviewed with pt, and pt to continue medical treatment as before,  to f/u any worsening symptoms or concerns BP Readings from Last 3 Encounters:  12/18/14 142/92  07/28/14 134/78  08/12/13 118/80

## 2014-12-19 NOTE — Assessment & Plan Note (Signed)
stable overall by history and exam, recent data reviewed with pt, and pt to continue medical treatment as before, has declines statin in past,  to f/u any worsening symptoms or concerns Lab Results  Component Value Date   LDLCALC 137* 12/18/2014

## 2014-12-19 NOTE — Assessment & Plan Note (Signed)
stable overall by history and exam, recent data reviewed with pt, and pt to continue medical treatment as before,  to f/u any worsening symptoms or concerns Lab Results  Component Value Date   TSH 24.43* 12/18/2014

## 2014-12-22 ENCOUNTER — Other Ambulatory Visit: Payer: Self-pay | Admitting: Internal Medicine

## 2014-12-22 MED ORDER — LEVOTHYROXINE SODIUM 125 MCG PO TABS: 125.0000 ug | ORAL_TABLET | Freq: Every day | ORAL | Status: DC

## 2015-01-18 DIAGNOSIS — Z87442 Personal history of urinary calculi: Secondary | ICD-10-CM | POA: Diagnosis not present

## 2015-01-21 ENCOUNTER — Encounter: Payer: Self-pay | Admitting: Internal Medicine

## 2015-01-21 ENCOUNTER — Ambulatory Visit (HOSPITAL_COMMUNITY): Payer: Medicare Other | Attending: Internal Medicine | Admitting: Cardiology

## 2015-01-21 ENCOUNTER — Ambulatory Visit (INDEPENDENT_AMBULATORY_CARE_PROVIDER_SITE_OTHER): Payer: Medicare Other | Admitting: Internal Medicine

## 2015-01-21 ENCOUNTER — Telehealth: Payer: Self-pay | Admitting: Internal Medicine

## 2015-01-21 VITALS — BP 142/70 | HR 78 | Temp 98.5°F | Ht 64.0 in | Wt 131.0 lb

## 2015-01-21 DIAGNOSIS — L659 Nonscarring hair loss, unspecified: Secondary | ICD-10-CM

## 2015-01-21 DIAGNOSIS — M7989 Other specified soft tissue disorders: Secondary | ICD-10-CM

## 2015-01-21 DIAGNOSIS — E039 Hypothyroidism, unspecified: Secondary | ICD-10-CM

## 2015-01-21 HISTORY — DX: Other specified soft tissue disorders: M79.89

## 2015-01-21 HISTORY — DX: Nonscarring hair loss, unspecified: L65.9

## 2015-01-21 NOTE — Progress Notes (Signed)
Pre visit review using our clinic review tool, if applicable. No additional management support is needed unless otherwise documented below in the visit note. 

## 2015-01-21 NOTE — Progress Notes (Signed)
Left lower extremity venous duplex performed  

## 2015-01-21 NOTE — Progress Notes (Signed)
Subjective:    Patient ID: Danielle [name], female    DOB: [birth year], 79 y.o.   MRN: [id number]  HPI  Here to f/u recent change in levothyrox .112 to .125 qd for elev TSH last visit jan 29, but now with rather dramatic hair loss, patch like to crown and more prox scalp, with some itching but no erythema or pain.  Has appt with derm next Monday.    Also mentions new onset since last PM LLE swelling, unusual for her, has not been sitting or standing prolonged, has hx of DVT remotely LLE, not currently on anticoag, and denies pain to leg or knee.  Pt denies chest pain, increased sob or doe, wheezing, orthopnea, PND, increased LE swelling, palpitations, dizziness or syncope.  Pt denies new neurological symptoms such as new headache, or facial or extremity weakness or numbness   Past Medical History  Diagnosis Date  . NEPHROLITHIASIS, HX OF 01/09/2008  . DVT, HX OF 01/09/2008  . INSOMNIA-SLEEP DISORDER-UNSPEC 06/15/2009  . Dizziness and giddiness 02/25/2010  . OSTEOARTHRITIS, CERVICAL SPINE 02/25/2010  . GERD 01/09/2008  . ALLERGIC RHINITIS 01/09/2008  . ASTHMATIC BRONCHITIS, ACUTE 10/20/2008  . AV BLOCK, COMPLETE 12/16/2009  . HYPERTENSION 01/09/2008  . DEPRESSION 01/09/2008  . ANXIETY 01/09/2008  . ANEMIA-NOS 01/09/2008  . HYPERLIPIDEMIA 01/09/2008  . GLUCOSE INTOLERANCE 01/09/2008  . HYPOTHYROIDISM 01/09/2008  . Impaired glucose tolerance 07/28/2011   Past Surgical History  Procedure Laterality Date  . Pacemaker placement      MedTronic EnPulse [id number]  . Ercp w/ sphicterotomy    . Hemorrhoid surgery    . Polypectomy      Uterine  . Permanent pacemaker generator change N/A 07/16/2012    Procedure: PERMANENT PACEMAKER GENERATOR CHANGE;  Surgeon: Deboraha Sprang, MD;  Location: [facility] CATH LAB;  Service: Cardiovascular;  Laterality: N/A;    reports that she quit smoking about 43 years ago. Her smoking use included Cigarettes. She has a 5 pack-year smoking history. She does not have any smokeless  tobacco history on file. She reports that she drinks alcohol. Her drug history is not on file. family history includes Cancer in her mother; Pulmonary embolism in her father; Sudden death in her mother. Allergies  Allergen Reactions  . Codeine Nausea And Vomiting  . Oxycodone Nausea And Vomiting   Current Outpatient Prescriptions on File Prior to Visit  Medication Sig Dispense Refill  . aspirin 81 MG EC tablet Take 81 mg by mouth daily.      Marland Kitchen atenolol (TENORMIN) 50 MG tablet Take 1 tablet (50 mg total) by mouth daily. 90 tablet 3  . atorvastatin (LIPITOR) 40 MG tablet Take 1 tablet (40 mg total) by mouth daily. 90 tablet 3  . azelastine (ASTELIN) 0.1 % nasal spray Place 1 spray into both nostrils 2 (two) times daily. Use in each nostril as directed 30 mL 11  . enalapril (VASOTEC) 10 MG tablet Take 1 tablet (10 mg total) by mouth daily. 90 tablet 3  . esomeprazole (NEXIUM) 40 MG capsule Take 1 capsule (40 mg total) by mouth daily. 30 capsule 11  . levothyroxine (SYNTHROID, LEVOTHROID) 125 MCG tablet Take 1 tablet (125 mcg total) by mouth daily. 90 tablet 3  . omeprazole (PRILOSEC) 20 MG capsule Take 1 capsule (20 mg total) by mouth daily. 90 capsule 3  . zolpidem (AMBIEN CR) 12.5 MG CR tablet take 1 tablet by mouth at bedtime if needed 30 tablet 5   No current facility-administered medications on  file prior to visit.   Review of Systems  Constitutional: Negative for unusual diaphoresis or other sweats  HENT: Negative for ringing in ear Eyes: Negative for double vision or worsening visual disturbance.  Respiratory: Negative for choking and stridor.   Gastrointestinal: Negative for vomiting or other signifcant bowel change Genitourinary: Negative for hematuria or decreased urine volume.  Musculoskeletal: Negative for other MSK pain or swelling Skin: Negative for color change and worsening wound.  Neurological: Negative for tremors and numbness other than noted  Psychiatric/Behavioral:  Negative for decreased concentration or agitation other than above       Objective:   Physical Exam BP 142/70 mmHg  Pulse 78  Temp(Src) 98.5 F (36.9 C) (Oral)  Ht 5\' 4"  (1.626 m)  Wt 131 lb (59.421 kg)  BMI 22.47 kg/m2  SpO2 99% N/A  VS noted,  Constitutional: Pt appears well-developed, well-nourished.  HENT: Head: NCAT.  Right Ear: External ear normal.  Left Ear: External ear normal.  Eyes: . Pupils are equal, round, and reactive to light. Conjunctivae and EOM are normal Neck: Normal range of motion. Neck supple. no goiter or tender Cardiovascular: Normal rate and regular rhythm.   Pulmonary/Chest: Effort normal and breath sounds without rales or wheezing.  Neurological: Pt is alert. Not confused , motor grossly intact Skin: Skin is warm. No rash LLE with 1-2+ edema to knee , neg homans, o/w neurovasc intact, RLE - no edema,  Psychiatric: Pt behavior is normal. No agitation.     Assessment & Plan:

## 2015-01-21 NOTE — Telephone Encounter (Signed)
Calling to give results of venous doppler---  Results negative

## 2015-01-21 NOTE — Assessment & Plan Note (Signed)
Unusual for her, no pain , but has hx of DVT remotely, for LLE venous doppler now

## 2015-01-21 NOTE — Patient Instructions (Signed)
Please continue all other medications as before, and refills have been done if requested.  Please have the pharmacy call with any other refills you may need.  Please keep your appointments with your specialists as you may have planned  You will be contacted regarding the referral for: left leg venous doppler to make sure no blood clot (to see [facility] now)  Please go to the LAB in the Basement (turn left off the elevator) for the tests to be done today  You will be contacted by phone if any changes need to be made immediately.  Otherwise, you will receive a letter about your results with an explanation, but please check with MyChart first.  Please remember to sign up for MyChart if you have not done so, as this will be important to you in the future with finding out test results, communicating by private email, and scheduling acute appointments online when needed.

## 2015-01-21 NOTE — Assessment & Plan Note (Signed)
Ok for repeat thyroid testing, I suspect not related to hair loss,  to f/u any worsening symptoms or concerns

## 2015-01-21 NOTE — Assessment & Plan Note (Signed)
?   [place], ? Need steroid injections , for cont'd f/u with derm as planned

## 2015-01-26 DIAGNOSIS — L659 Nonscarring hair loss, unspecified: Secondary | ICD-10-CM | POA: Diagnosis not present

## 2015-01-26 DIAGNOSIS — L219 Seborrheic dermatitis, unspecified: Secondary | ICD-10-CM | POA: Diagnosis not present

## 2015-01-26 DIAGNOSIS — L570 Actinic keratosis: Secondary | ICD-10-CM | POA: Diagnosis not present

## 2015-01-26 DIAGNOSIS — B354 Tinea corporis: Secondary | ICD-10-CM | POA: Diagnosis not present

## 2015-02-01 ENCOUNTER — Encounter: Payer: Medicare Other | Admitting: *Deleted

## 2015-02-01 ENCOUNTER — Telehealth: Payer: Self-pay | Admitting: Cardiology

## 2015-02-01 NOTE — Telephone Encounter (Signed)
Attempted to confirm remote transmission with pt. No answer and was unable to leave a message.   

## 2015-02-04 ENCOUNTER — Telehealth: Payer: Self-pay

## 2015-02-04 ENCOUNTER — Encounter: Payer: Self-pay | Admitting: Cardiology

## 2015-02-04 NOTE — Telephone Encounter (Signed)
Left voice mail advising pt to call back to schedule if she still wants flu vaccine

## 2015-02-16 ENCOUNTER — Ambulatory Visit (INDEPENDENT_AMBULATORY_CARE_PROVIDER_SITE_OTHER): Payer: Medicare Other | Admitting: *Deleted

## 2015-02-16 DIAGNOSIS — I442 Atrioventricular block, complete: Secondary | ICD-10-CM | POA: Diagnosis not present

## 2015-02-16 LAB — MDC_IDC_ENUM_SESS_TYPE_REMOTE
Battery Remaining Longevity: 88 mo
Brady Statistic AP VP Percent: 52 %
Brady Statistic AS VP Percent: 48 %
Brady Statistic AS VS Percent: 0 %
Date Time Interrogation Session: 20160329183617
Lead Channel Impedance Value: 719 Ohm
Lead Channel Impedance Value: 880 Ohm
Lead Channel Pacing Threshold Amplitude: 1.625 V
Lead Channel Pacing Threshold Pulse Width: 0.4 ms
Lead Channel Sensing Intrinsic Amplitude: 2.8 mV
Lead Channel Setting Pacing Amplitude: 3.25 V
MDC IDC MSMT BATTERY IMPEDANCE: 180 Ohm
MDC IDC MSMT BATTERY VOLTAGE: 2.78 V
MDC IDC MSMT LEADCHNL RA PACING THRESHOLD AMPLITUDE: 2.5 V
MDC IDC MSMT LEADCHNL RV PACING THRESHOLD PULSEWIDTH: 0.4 ms
MDC IDC SET LEADCHNL RA PACING AMPLITUDE: 5 V
MDC IDC SET LEADCHNL RV PACING PULSEWIDTH: 0.4 ms
MDC IDC SET LEADCHNL RV SENSING SENSITIVITY: 5.6 mV
MDC IDC STAT BRADY AP VS PERCENT: 0 %

## 2015-02-18 NOTE — Progress Notes (Signed)
Remote pacemaker transmission.   

## 2015-02-24 ENCOUNTER — Encounter: Payer: Self-pay | Admitting: Cardiology

## 2015-02-25 ENCOUNTER — Encounter: Payer: Medicare Other | Admitting: Internal Medicine

## 2015-03-04 ENCOUNTER — Encounter: Payer: Self-pay | Admitting: Internal Medicine

## 2015-03-10 ENCOUNTER — Encounter: Payer: Self-pay | Admitting: Cardiology

## 2015-05-05 ENCOUNTER — Ambulatory Visit (INDEPENDENT_AMBULATORY_CARE_PROVIDER_SITE_OTHER): Payer: Medicare Other | Admitting: Internal Medicine

## 2015-05-05 ENCOUNTER — Encounter: Payer: Self-pay | Admitting: Internal Medicine

## 2015-05-05 VITALS — BP 110/60 | HR 66 | Temp 97.8°F | Ht 62.0 in | Wt 121.0 lb

## 2015-05-05 DIAGNOSIS — M7022 Olecranon bursitis, left elbow: Secondary | ICD-10-CM | POA: Diagnosis not present

## 2015-05-05 HISTORY — DX: Olecranon bursitis, left elbow: M70.22

## 2015-05-05 NOTE — Progress Notes (Signed)
Subjective:    Patient ID: Danielle [name], female    DOB: 30-Sep-1936, 79 y.o.   MRN: [id number]  HPI  Here with acute onset left elbow tip redness and swelling x 3 days, better today and minor pain now.  No obvious trauma but  Works the garden and has several horses she takes care of nof ever Past Medical History  Diagnosis Date  . NEPHROLITHIASIS, HX OF 01/09/2008  . DVT, HX OF 01/09/2008  . INSOMNIA-SLEEP DISORDER-UNSPEC 06/15/2009  . Dizziness and giddiness 02/25/2010  . OSTEOARTHRITIS, CERVICAL SPINE 02/25/2010  . GERD 01/09/2008  . ALLERGIC RHINITIS 01/09/2008  . ASTHMATIC BRONCHITIS, ACUTE 10/20/2008  . AV BLOCK, COMPLETE 12/16/2009  . HYPERTENSION 01/09/2008  . DEPRESSION 01/09/2008  . ANXIETY 01/09/2008  . ANEMIA-NOS 01/09/2008  . HYPERLIPIDEMIA 01/09/2008  . GLUCOSE INTOLERANCE 01/09/2008  . HYPOTHYROIDISM 01/09/2008  . Impaired glucose tolerance 07/28/2011   Past Surgical History  Procedure Laterality Date  . Pacemaker placement      MedTronic EnPulse [id number]  . Ercp w/ sphicterotomy    . Hemorrhoid surgery    . Polypectomy      Uterine  . Permanent pacemaker generator change N/A 07/16/2012    Procedure: PERMANENT PACEMAKER GENERATOR CHANGE;  Surgeon: Deboraha Sprang, MD;  Location: [facility] CATH LAB;  Service: Cardiovascular;  Laterality: N/A;    reports that she quit smoking about 43 years ago. Her smoking use included Cigarettes. She has a 5 pack-year smoking history. She does not have any smokeless tobacco history on file. She reports that she drinks alcohol. Her drug history is not on file. family history includes Cancer in her mother; Pulmonary embolism in her father; Sudden death in her mother. Allergies  Allergen Reactions  . Codeine Nausea And Vomiting  . Oxycodone Nausea And Vomiting   Current Outpatient Prescriptions on File Prior to Visit  Medication Sig Dispense Refill  . aspirin 81 MG EC tablet Take 81 mg by mouth daily.      Marland Kitchen atenolol (TENORMIN) 50 MG tablet Take  1 tablet (50 mg total) by mouth daily. 90 tablet 3  . atorvastatin (LIPITOR) 40 MG tablet Take 1 tablet (40 mg total) by mouth daily. 90 tablet 3  . azelastine (ASTELIN) 0.1 % nasal spray Place 1 spray into both nostrils 2 (two) times daily. Use in each nostril as directed 30 mL 11  . enalapril (VASOTEC) 10 MG tablet Take 1 tablet (10 mg total) by mouth daily. 90 tablet 3  . levothyroxine (SYNTHROID, LEVOTHROID) 125 MCG tablet Take 1 tablet (125 mcg total) by mouth daily. 90 tablet 3  . zolpidem (AMBIEN CR) 12.5 MG CR tablet take 1 tablet by mouth at bedtime if needed 30 tablet 5  . esomeprazole (NEXIUM) 40 MG capsule Take 1 capsule (40 mg total) by mouth daily. (Patient not taking: Reported on 05/05/2015) 30 capsule 11  . omeprazole (PRILOSEC) 20 MG capsule Take 1 capsule (20 mg total) by mouth daily. (Patient not taking: Reported on 05/05/2015) 90 capsule 3   No current facility-administered medications on file prior to visit.       Review of Systems All otherwise neg per pt     Objective:   Physical Exam BP 110/60 mmHg  Pulse 66  Temp(Src) 97.8 F (36.6 C) (Oral)  Ht 5\' 2"  (1.575 m)  Wt 121 lb (54.885 kg)  BMI 22.13 kg/m2  SpO2 97% VS noted,  Constitutional: Pt appears in no significant distress HENT: Head: NCAT.  Right Ear:  External ear normal.  Left Ear: External ear normal.  Eyes: . Pupils are equal, round, and reactive to light. Conjunctivae and EOM are normal Neck: Normal range of motion. Neck supple.  Cardiovascular: Normal rate and regular rhythm.   Pulmonary/Chest: Effort normal and breath sounds without rales or wheezing.  Neurological: Pt is alert. Not confused , motor grossly intact Skin: Skin is warm. No rash, no LE edema Psychiatric: Pt behavior is normal. No agitation.  Left elbow with 1+ bursa tender/swelling     Assessment & Plan:

## 2015-05-05 NOTE — Patient Instructions (Signed)
Please continue all other medications as before, and refills have been done if requested.  Please have the pharmacy call with any other refills you may need.  Please keep your appointments with your specialists as you may have planned    

## 2015-05-05 NOTE — Progress Notes (Signed)
Pre visit review using our clinic review tool, if applicable. No additional management support is needed unless otherwise documented below in the visit note. 

## 2015-05-05 NOTE — Assessment & Plan Note (Signed)
Mild, no specific tx needed, educated, reassured, to avoid further trauma, should heal in 3-4 wks

## 2015-05-18 ENCOUNTER — Telehealth: Payer: Self-pay | Admitting: Cardiology

## 2015-05-18 ENCOUNTER — Encounter: Payer: Medicare Other | Admitting: *Deleted

## 2015-05-18 NOTE — Telephone Encounter (Signed)
LMOVM reminding pt to send remote transmission.   

## 2015-05-19 ENCOUNTER — Encounter: Payer: Self-pay | Admitting: Cardiology

## 2015-05-27 ENCOUNTER — Ambulatory Visit (INDEPENDENT_AMBULATORY_CARE_PROVIDER_SITE_OTHER): Payer: Medicare Other | Admitting: *Deleted

## 2015-05-27 DIAGNOSIS — I442 Atrioventricular block, complete: Secondary | ICD-10-CM

## 2015-05-27 NOTE — Progress Notes (Signed)
Remote pacemaker transmission.   

## 2015-05-29 LAB — CUP PACEART REMOTE DEVICE CHECK
Battery Remaining Longevity: 94 mo
Brady Statistic AP VP Percent: 54 %
Brady Statistic AS VP Percent: 46 %
Brady Statistic AS VS Percent: 0 %
Lead Channel Impedance Value: 896 Ohm
Lead Channel Setting Pacing Amplitude: 4.5 V
Lead Channel Setting Pacing Pulse Width: 0.4 ms
MDC IDC MSMT BATTERY IMPEDANCE: 204 Ohm
MDC IDC MSMT BATTERY VOLTAGE: 2.77 V
MDC IDC MSMT LEADCHNL RA IMPEDANCE VALUE: 774 Ohm
MDC IDC MSMT LEADCHNL RA PACING THRESHOLD AMPLITUDE: 2.25 V
MDC IDC MSMT LEADCHNL RA PACING THRESHOLD PULSEWIDTH: 0.4 ms
MDC IDC MSMT LEADCHNL RA SENSING INTR AMPL: 2.8 mV
MDC IDC MSMT LEADCHNL RV PACING THRESHOLD AMPLITUDE: 1.5 V
MDC IDC MSMT LEADCHNL RV PACING THRESHOLD PULSEWIDTH: 0.4 ms
MDC IDC SESS DTM: 20160707173158
MDC IDC SET LEADCHNL RV PACING AMPLITUDE: 3 V
MDC IDC SET LEADCHNL RV SENSING SENSITIVITY: 5.6 mV
MDC IDC STAT BRADY AP VS PERCENT: 0 %

## 2015-06-25 ENCOUNTER — Encounter: Payer: Self-pay | Admitting: Cardiology

## 2015-07-02 ENCOUNTER — Encounter: Payer: Self-pay | Admitting: Internal Medicine

## 2015-09-09 DIAGNOSIS — J3089 Other allergic rhinitis: Secondary | ICD-10-CM | POA: Diagnosis not present

## 2015-09-09 DIAGNOSIS — J3081 Allergic rhinitis due to animal (cat) (dog) hair and dander: Secondary | ICD-10-CM | POA: Diagnosis not present

## 2015-09-23 ENCOUNTER — Encounter: Payer: Self-pay | Admitting: Internal Medicine

## 2015-09-28 ENCOUNTER — Ambulatory Visit (INDEPENDENT_AMBULATORY_CARE_PROVIDER_SITE_OTHER): Payer: Medicare Other | Admitting: Internal Medicine

## 2015-09-28 ENCOUNTER — Encounter: Payer: Self-pay | Admitting: Internal Medicine

## 2015-09-28 VITALS — BP 128/72 | HR 84 | Ht 63.0 in | Wt 119.0 lb

## 2015-09-28 DIAGNOSIS — I442 Atrioventricular block, complete: Secondary | ICD-10-CM | POA: Diagnosis not present

## 2015-09-28 DIAGNOSIS — Z950 Presence of cardiac pacemaker: Secondary | ICD-10-CM

## 2015-09-28 LAB — CUP PACEART INCLINIC DEVICE CHECK
Battery Remaining Longevity: 86 mo
Battery Voltage: 2.78 V
Brady Statistic AS VP Percent: 45 %
Implantable Lead Implant Date: 19970117
Implantable Lead Implant Date: 19970117
Implantable Lead Location: 753859
Implantable Lead Model: 4068
Implantable Lead Model: 4068
Lead Channel Impedance Value: 681 Ohm
Lead Channel Impedance Value: 944 Ohm
Lead Channel Pacing Threshold Amplitude: 1.375 V
Lead Channel Pacing Threshold Amplitude: 1.75 V
Lead Channel Pacing Threshold Pulse Width: 0.4 ms
Lead Channel Pacing Threshold Pulse Width: 0.64 ms
Lead Channel Sensing Intrinsic Amplitude: 2.8 mV
Lead Channel Setting Pacing Amplitude: 3 V
MDC IDC LEAD LOCATION: 753860
MDC IDC MSMT BATTERY IMPEDANCE: 252 Ohm
MDC IDC MSMT LEADCHNL RA PACING THRESHOLD AMPLITUDE: 2 V
MDC IDC MSMT LEADCHNL RV PACING THRESHOLD AMPLITUDE: 0.75 V
MDC IDC MSMT LEADCHNL RV PACING THRESHOLD PULSEWIDTH: 0.4 ms
MDC IDC MSMT LEADCHNL RV PACING THRESHOLD PULSEWIDTH: 0.64 ms
MDC IDC SESS DTM: 20161108161552
MDC IDC SET LEADCHNL RA PACING AMPLITUDE: 2.5 V
MDC IDC SET LEADCHNL RV PACING PULSEWIDTH: 0.4 ms
MDC IDC SET LEADCHNL RV SENSING SENSITIVITY: 5.6 mV
MDC IDC STAT BRADY AP VP PERCENT: 54 %
MDC IDC STAT BRADY AP VS PERCENT: 0 %
MDC IDC STAT BRADY AS VS PERCENT: 0 %

## 2015-09-28 NOTE — Progress Notes (Signed)
      Patient Care Team: Biagio Borg, MD as PCP - General   HPI  Danielle [name] is a 79 y.o. female is seen in followup for complete heart block for which she is status post pacemaker.  She underwent device generator replacement 8/13; in discoloration of both leads were noted; lead function was normal   The patient denies chest pain, shortness of breath, nocturnal dyspnea, orthopnea or peripheral edema. There have been no palpitations, lightheadedness or syncope.    Past Medical History  Diagnosis Date  . NEPHROLITHIASIS, HX OF 01/09/2008  . DVT, HX OF 01/09/2008  . INSOMNIA-SLEEP DISORDER-UNSPEC 06/15/2009  . Dizziness and giddiness 02/25/2010  . OSTEOARTHRITIS, CERVICAL SPINE 02/25/2010  . GERD 01/09/2008  . ALLERGIC RHINITIS 01/09/2008  . ASTHMATIC BRONCHITIS, ACUTE 10/20/2008  . AV BLOCK, COMPLETE 12/16/2009  . HYPERTENSION 01/09/2008  . DEPRESSION 01/09/2008  . ANXIETY 01/09/2008  . ANEMIA-NOS 01/09/2008  . HYPERLIPIDEMIA 01/09/2008  . GLUCOSE INTOLERANCE 01/09/2008  . HYPOTHYROIDISM 01/09/2008  . Impaired glucose tolerance 07/28/2011    Past Surgical History  Procedure Laterality Date  . Pacemaker placement      MedTronic EnPulse [id number]  . Ercp w/ sphicterotomy    . Hemorrhoid surgery    . Polypectomy      Uterine  . Permanent pacemaker generator change N/A 07/16/2012    Procedure: PERMANENT PACEMAKER GENERATOR CHANGE;  Surgeon: Deboraha Sprang, MD;  Location: [facility] CATH LAB;  Service: Cardiovascular;  Laterality: N/A;    Current Outpatient Prescriptions  Medication Sig Dispense Refill  . aspirin 81 MG EC tablet Take 81 mg by mouth daily.      Marland Kitchen atenolol (TENORMIN) 50 MG tablet Take 1 tablet (50 mg total) by mouth daily. 90 tablet 3  . azelastine (ASTELIN) 0.1 % nasal spray Place 1 spray into both nostrils 2 (two) times daily. Use in each nostril as directed 30 mL 11  . enalapril (VASOTEC) 10 MG tablet Take 1 tablet (10 mg total) by mouth daily. 90 tablet 3  .  levothyroxine (SYNTHROID, LEVOTHROID) 125 MCG tablet Take 1 tablet (125 mcg total) by mouth daily. 90 tablet 3  . zolpidem (AMBIEN CR) 12.5 MG CR tablet take 1 tablet by mouth at bedtime if needed 30 tablet 5   No current facility-administered medications for this visit.    Allergies  Allergen Reactions  . Codeine Nausea And Vomiting  . Oxycodone Nausea And Vomiting    Review of Systems negative except from HPI and PMH  Physical Exam BP 128/72 mmHg  Pulse 84  Ht 5\' 3"  (1.6 m)  Wt 119 lb (53.978 kg)  BMI 21.09 kg/m2  SpO2 99% Well developed and nourished in no acute distress HENT normal Neck supple with JVP-flat Clear Device pocket well healed; without hematoma or erythema.  There is no tethering Regular rate and rhythm, no murmurs or gallops Abd-soft with active BS No Clubbing cyanosis edema Skin-warm and dry A & Oriented  Grossly normal sensory and motor function  ecg AV pacing  Assessment and  Plan  Complete heart block   Pacemaker  Medtronic  The patient's device was interrogated.  The information was reviewed.  Changes were made to reduce the output voltage it is there is a pulse width. This should improve longevity

## 2015-09-28 NOTE — Patient Instructions (Signed)
Medication Instructions: - no changes  Labwork: - none  Procedures/Testing: - none  Follow-Up: - Remote monitoring is used to monitor your Pacemaker of ICD from home. This monitoring reduces the number of office visits required to check your device to one time per year. It allows [name] to keep an eye on the functioning of your device to ensure it is working properly. You are scheduled for a device check from home on 12/28/15. You may send your transmission at any time that day. If you have a wireless device, the transmission will be sent automatically. After your physician reviews your transmission, you will receive a postcard with your next transmission date.  - Your physician wants you to follow-up in: 1 year with Dr. Caryl Comes. You will receive a reminder letter in the mail two months in advance. If you don't receive a letter, please call our office to schedule the follow-up appointment.  Any Additional Special Instructions Will Be Listed Below (If Applicable). - none

## 2015-12-03 DIAGNOSIS — M25551 Pain in right hip: Secondary | ICD-10-CM | POA: Diagnosis not present

## 2015-12-10 DIAGNOSIS — M25551 Pain in right hip: Secondary | ICD-10-CM | POA: Diagnosis not present

## 2015-12-21 DIAGNOSIS — M25551 Pain in right hip: Secondary | ICD-10-CM | POA: Diagnosis not present

## 2015-12-28 ENCOUNTER — Telehealth: Payer: Self-pay | Admitting: Cardiology

## 2015-12-28 ENCOUNTER — Ambulatory Visit: Payer: Medicare Other | Admitting: *Deleted

## 2015-12-28 DIAGNOSIS — M25551 Pain in right hip: Secondary | ICD-10-CM | POA: Diagnosis not present

## 2015-12-28 NOTE — Telephone Encounter (Signed)
LMOVM reminding pt to send remote transmission.   

## 2015-12-29 ENCOUNTER — Encounter: Payer: Self-pay | Admitting: Cardiology

## 2015-12-30 DIAGNOSIS — M25551 Pain in right hip: Secondary | ICD-10-CM | POA: Diagnosis not present

## 2016-01-04 DIAGNOSIS — M25551 Pain in right hip: Secondary | ICD-10-CM | POA: Diagnosis not present

## 2016-01-17 ENCOUNTER — Other Ambulatory Visit: Payer: Self-pay

## 2016-01-17 MED ORDER — LEVOTHYROXINE SODIUM 125 MCG PO TABS: 125.0000 ug | ORAL_TABLET | Freq: Every day | ORAL | Status: DC

## 2016-01-17 MED ORDER — ATENOLOL 50 MG PO TABS: 50.0000 mg | ORAL_TABLET | Freq: Every day | ORAL | Status: DC

## 2016-01-17 MED ORDER — ENALAPRIL MALEATE 10 MG PO TABS: 10.0000 mg | ORAL_TABLET | Freq: Every day | ORAL | Status: DC

## 2016-01-18 DIAGNOSIS — M25551 Pain in right hip: Secondary | ICD-10-CM | POA: Diagnosis not present

## 2016-01-19 ENCOUNTER — Ambulatory Visit (INDEPENDENT_AMBULATORY_CARE_PROVIDER_SITE_OTHER): Payer: Medicare Other | Admitting: *Deleted

## 2016-01-19 DIAGNOSIS — I442 Atrioventricular block, complete: Secondary | ICD-10-CM

## 2016-01-19 DIAGNOSIS — Z950 Presence of cardiac pacemaker: Secondary | ICD-10-CM

## 2016-01-21 ENCOUNTER — Encounter: Payer: Self-pay | Admitting: Cardiology

## 2016-01-21 NOTE — Progress Notes (Signed)
Remote pacemaker transmission.   

## 2016-02-17 ENCOUNTER — Encounter (INDEPENDENT_AMBULATORY_CARE_PROVIDER_SITE_OTHER): Payer: Self-pay

## 2016-02-22 ENCOUNTER — Telehealth: Payer: Self-pay

## 2016-02-22 MED ORDER — OMEPRAZOLE 20 MG PO CPDR: 20.0000 mg | DELAYED_RELEASE_CAPSULE | Freq: Every day | ORAL | Status: DC

## 2016-02-22 NOTE — Telephone Encounter (Signed)
Done erx 

## 2016-02-22 NOTE — Telephone Encounter (Signed)
rf rq for omeprazole from [employer]. Med is no longer active. pls advise.

## 2016-02-23 LAB — CUP PACEART REMOTE DEVICE CHECK
Battery Remaining Longevity: 85 mo
Brady Statistic AS VS Percent: 0 %
Date Time Interrogation Session: 20170301163457
Implantable Lead Implant Date: 19970117
Implantable Lead Location: 753859
Implantable Lead Location: 753860
Lead Channel Impedance Value: 916 Ohm
Lead Channel Pacing Threshold Amplitude: 1.5 V
Lead Channel Pacing Threshold Pulse Width: 0.4 ms
Lead Channel Sensing Intrinsic Amplitude: 1.4 mV
Lead Channel Setting Pacing Amplitude: 4.5 V
Lead Channel Setting Pacing Pulse Width: 0.4 ms
MDC IDC LEAD IMPLANT DT: 19970117
MDC IDC MSMT BATTERY IMPEDANCE: 275 Ohm
MDC IDC MSMT BATTERY VOLTAGE: 2.78 V
MDC IDC MSMT LEADCHNL RA IMPEDANCE VALUE: 719 Ohm
MDC IDC MSMT LEADCHNL RA PACING THRESHOLD AMPLITUDE: 2.25 V
MDC IDC MSMT LEADCHNL RA PACING THRESHOLD PULSEWIDTH: 0.4 ms
MDC IDC SET LEADCHNL RV PACING AMPLITUDE: 3 V
MDC IDC SET LEADCHNL RV SENSING SENSITIVITY: 5.6 mV
MDC IDC STAT BRADY AP VP PERCENT: 54 %
MDC IDC STAT BRADY AP VS PERCENT: 0 %
MDC IDC STAT BRADY AS VP PERCENT: 46 %

## 2016-02-29 ENCOUNTER — Encounter: Payer: Self-pay | Admitting: Cardiology

## 2016-03-14 ENCOUNTER — Encounter: Payer: Self-pay | Admitting: Cardiology

## 2016-04-24 ENCOUNTER — Telehealth: Payer: Self-pay | Admitting: Cardiology

## 2016-04-24 ENCOUNTER — Ambulatory Visit (INDEPENDENT_AMBULATORY_CARE_PROVIDER_SITE_OTHER): Payer: Medicare Other | Admitting: *Deleted

## 2016-04-24 DIAGNOSIS — I442 Atrioventricular block, complete: Secondary | ICD-10-CM

## 2016-04-24 NOTE — Telephone Encounter (Signed)
LMOVM reminding pt to send remote transmission.   

## 2016-04-25 NOTE — Progress Notes (Signed)
Remote pacemaker transmission.   

## 2016-05-04 LAB — CUP PACEART REMOTE DEVICE CHECK
Brady Statistic AP VS Percent: 0 %
Date Time Interrogation Session: 20170605181157
Implantable Lead Implant Date: 19970117
Implantable Lead Location: 753859
Implantable Lead Model: 4068
Lead Channel Impedance Value: 693 Ohm
Lead Channel Impedance Value: 871 Ohm
Lead Channel Setting Pacing Pulse Width: 0.4 ms
MDC IDC LEAD IMPLANT DT: 19970117
MDC IDC LEAD LOCATION: 753860
MDC IDC MSMT BATTERY IMPEDANCE: 299 Ohm
MDC IDC MSMT BATTERY REMAINING LONGEVITY: 55 mo
MDC IDC MSMT BATTERY VOLTAGE: 2.78 V
MDC IDC SET LEADCHNL RA PACING AMPLITUDE: 5 V
MDC IDC SET LEADCHNL RV PACING AMPLITUDE: 3 V
MDC IDC SET LEADCHNL RV SENSING SENSITIVITY: 5.6 mV
MDC IDC STAT BRADY AP VP PERCENT: 57 %
MDC IDC STAT BRADY AS VP PERCENT: 43 %
MDC IDC STAT BRADY AS VS PERCENT: 0 %

## 2016-05-10 ENCOUNTER — Encounter: Payer: Self-pay | Admitting: Cardiology

## 2016-05-11 ENCOUNTER — Other Ambulatory Visit (INDEPENDENT_AMBULATORY_CARE_PROVIDER_SITE_OTHER): Payer: Medicare Other

## 2016-05-11 ENCOUNTER — Ambulatory Visit (INDEPENDENT_AMBULATORY_CARE_PROVIDER_SITE_OTHER): Payer: Medicare Other | Admitting: Internal Medicine

## 2016-05-11 ENCOUNTER — Other Ambulatory Visit: Payer: Self-pay | Admitting: Internal Medicine

## 2016-05-11 ENCOUNTER — Encounter: Payer: Self-pay | Admitting: Internal Medicine

## 2016-05-11 VITALS — BP 120/62 | HR 80 | Temp 98.7°F | Resp 20 | Wt 120.0 lb

## 2016-05-11 DIAGNOSIS — E039 Hypothyroidism, unspecified: Secondary | ICD-10-CM | POA: Diagnosis not present

## 2016-05-11 DIAGNOSIS — E785 Hyperlipidemia, unspecified: Secondary | ICD-10-CM | POA: Diagnosis not present

## 2016-05-11 DIAGNOSIS — R7302 Impaired glucose tolerance (oral): Secondary | ICD-10-CM | POA: Diagnosis not present

## 2016-05-11 DIAGNOSIS — I519 Heart disease, unspecified: Principal | ICD-10-CM

## 2016-05-11 DIAGNOSIS — I10 Essential (primary) hypertension: Secondary | ICD-10-CM

## 2016-05-11 LAB — CBC WITH DIFFERENTIAL/PLATELET
BASOS PCT: 0.6 % (ref 0.0–3.0)
Basophils Absolute: 0 10*3/uL (ref 0.0–0.1)
EOS ABS: 0.2 10*3/uL (ref 0.0–0.7)
Eosinophils Relative: 3.5 % (ref 0.0–5.0)
HEMATOCRIT: 36.6 % (ref 36.0–46.0)
HEMOGLOBIN: 12.3 g/dL (ref 12.0–15.0)
LYMPHS PCT: 28.3 % (ref 12.0–46.0)
Lymphs Abs: 1.7 10*3/uL (ref 0.7–4.0)
MCHC: 33.5 g/dL (ref 30.0–36.0)
MCV: 93.4 fl (ref 78.0–100.0)
MONO ABS: 0.6 10*3/uL (ref 0.1–1.0)
Monocytes Relative: 9.4 % (ref 3.0–12.0)
NEUTROS ABS: 3.6 10*3/uL (ref 1.4–7.7)
Neutrophils Relative %: 58.2 % (ref 43.0–77.0)
Platelets: 196 10*3/uL (ref 150.0–400.0)
RBC: 3.92 Mil/uL (ref 3.87–5.11)
RDW: 14.1 % (ref 11.5–15.5)
WBC: 6.2 10*3/uL (ref 4.0–10.5)

## 2016-05-11 LAB — BASIC METABOLIC PANEL
BUN: 31 mg/dL — ABNORMAL HIGH (ref 6–23)
CHLORIDE: 106 meq/L (ref 96–112)
CO2: 28 mEq/L (ref 19–32)
Calcium: 10.2 mg/dL (ref 8.4–10.5)
Creatinine, Ser: 0.88 mg/dL (ref 0.40–1.20)
GFR: 65.98 mL/min (ref >60.00)
Glucose, Bld: 101 mg/dL — ABNORMAL HIGH (ref 70–99)
POTASSIUM: 4.6 meq/L (ref 3.5–5.1)
Sodium: 140 mEq/L (ref 135–145)

## 2016-05-11 LAB — HEPATIC FUNCTION PANEL
ALT: 16 U/L (ref 0–35)
AST: 20 U/L (ref 0–37)
Albumin: 4.4 g/dL (ref 3.5–5.2)
Alkaline Phosphatase: 78 U/L (ref 39–117)
BILIRUBIN DIRECT: 0.1 mg/dL (ref 0.0–0.3)
BILIRUBIN TOTAL: 0.9 mg/dL (ref 0.2–1.2)
Total Protein: 6.6 g/dL (ref 6.0–8.3)

## 2016-05-11 LAB — LIPID PANEL
CHOL/HDL RATIO: 3
Cholesterol: 253 mg/dL — ABNORMAL HIGH (ref 0–200)
HDL: 98.8 mg/dL (ref >39.00)
NONHDL: 154.29
Triglycerides: 214 mg/dL — ABNORMAL HIGH (ref 0.0–149.0)
VLDL: 42.8 mg/dL — ABNORMAL HIGH (ref 0.0–40.0)

## 2016-05-11 LAB — LDL CHOLESTEROL, DIRECT: LDL DIRECT: 128 mg/dL

## 2016-05-11 LAB — T4, FREE: FREE T4: 1.26 ng/dL (ref 0.60–1.60)

## 2016-05-11 LAB — TSH: TSH: 0.07 u[IU]/mL — AB (ref 0.35–4.50)

## 2016-05-11 LAB — HEMOGLOBIN A1C: Hgb A1c MFr Bld: 5.4 % (ref 4.6–6.5)

## 2016-05-11 MED ORDER — LEVOTHYROXINE SODIUM 100 MCG PO TABS: 100.0000 ug | ORAL_TABLET | Freq: Every day | ORAL | Status: DC

## 2016-05-11 NOTE — Patient Instructions (Signed)

## 2016-05-11 NOTE — Progress Notes (Signed)
Subjective:    Patient ID: Danielle [name], female    DOB: Feb 18, 1936, 80 y.o.   MRN: [id number]  HPI  Here for yearly f/u;  Overall doing ok;  Pt denies Chest pain, worsening SOB, DOE, wheezing, orthopnea, PND, worsening LE edema, palpitations, dizziness or syncope.  Pt denies neurological change such as new headache, facial or extremity weakness.  Pt denies polydipsia, polyuria, or low sugar symptoms. Pt states overall good compliance with treatment and medications, good tolerability, and has been trying to follow appropriate diet.  Pt denies worsening depressive symptoms, suicidal ideation or panic. No fever, night sweats, wt loss, loss of appetite, or other constitutional symptoms.  Pt states good ability with ADL's, has low fall risk, home safety reviewed and adequate, no other significant changes in hearing or vision, still takes care of 2 horses and 5 acres of gardens at her home.  Still sleeps with neck brace nightly, and no further pain. Used to see Dr Serita Butcher before his retirement, and no further UTI after stone removal. Past Medical History  Diagnosis Date  . NEPHROLITHIASIS, HX OF 01/09/2008  . DVT, HX OF 01/09/2008  . INSOMNIA-SLEEP DISORDER-UNSPEC 06/15/2009  . Dizziness and giddiness 02/25/2010  . OSTEOARTHRITIS, CERVICAL SPINE 02/25/2010  . GERD 01/09/2008  . ALLERGIC RHINITIS 01/09/2008  . ASTHMATIC BRONCHITIS, ACUTE 10/20/2008  . AV BLOCK, COMPLETE 12/16/2009  . HYPERTENSION 01/09/2008  . DEPRESSION 01/09/2008  . ANXIETY 01/09/2008  . ANEMIA-NOS 01/09/2008  . HYPERLIPIDEMIA 01/09/2008  . GLUCOSE INTOLERANCE 01/09/2008  . HYPOTHYROIDISM 01/09/2008  . Impaired glucose tolerance 07/28/2011   Past Surgical History  Procedure Laterality Date  . Pacemaker placement      MedTronic EnPulse [id number]  . Ercp w/ sphicterotomy    . Hemorrhoid surgery    . Polypectomy      Uterine  . Permanent pacemaker generator change N/A 07/16/2012    Procedure: PERMANENT PACEMAKER GENERATOR CHANGE;   Surgeon: Deboraha Sprang, MD;  Location: [facility] CATH LAB;  Service: Cardiovascular;  Laterality: N/A;    reports that she quit smoking about 44 years ago. Her smoking use included Cigarettes. She has a 5 pack-year smoking history. She does not have any smokeless tobacco history on file. She reports that she drinks alcohol. Her drug history is not on file. family history includes Cancer in her mother; Pulmonary embolism in her father; Sudden death in her mother. Allergies  Allergen Reactions  . Codeine Nausea And Vomiting  . Oxycodone Nausea And Vomiting   Current Outpatient Prescriptions on File Prior to Visit  Medication Sig Dispense Refill  . aspirin 81 MG EC tablet Take 81 mg by mouth daily.      Marland Kitchen atenolol (TENORMIN) 50 MG tablet Take 1 tablet (50 mg total) by mouth daily. 90 tablet 3  . azelastine (ASTELIN) 0.1 % nasal spray Place 1 spray into both nostrils 2 (two) times daily. Use in each nostril as directed 30 mL 11  . enalapril (VASOTEC) 10 MG tablet Take 1 tablet (10 mg total) by mouth daily. 90 tablet 3  . levothyroxine (SYNTHROID, LEVOTHROID) 125 MCG tablet Take 1 tablet (125 mcg total) by mouth daily. 90 tablet 3  . omeprazole (PRILOSEC) 20 MG capsule Take 1 capsule (20 mg total) by mouth daily. 90 capsule 3  . zolpidem (AMBIEN CR) 12.5 MG CR tablet take 1 tablet by mouth at bedtime if needed 30 tablet 5   No current facility-administered medications on file prior to visit.   Review of Systems  Constitutional: Negative for increased diaphoresis, or other activity, appetite or siginficant weight change other than noted HENT: Negative for worsening hearing loss, ear pain, facial swelling, mouth sores and neck stiffness.   Eyes: Negative for other worsening pain, redness or visual disturbance.  Respiratory: Negative for choking or stridor Cardiovascular: Negative for other chest pain and palpitations.  Gastrointestinal: Negative for worsening diarrhea, blood in stool, or abdominal  distention Genitourinary: Negative for hematuria, flank pain or change in urine volume.  Musculoskeletal: Negative for myalgias or other joint complaints.  Skin: Negative for other color change and wound or drainage.  Neurological: Negative for syncope and numbness. other than noted Hematological: Negative for adenopathy. or other swelling Psychiatric/Behavioral: Negative for hallucinations, SI, self-injury, decreased concentration or other worsening agitation.      Objective:   Physical Exam BP 120/62 mmHg  Pulse 80  Temp(Src) 98.7 F (37.1 C) (Oral)  Resp 20  Wt 120 lb (54.432 kg)  SpO2 98% VS noted,  Constitutional: Pt is oriented to person, place, and time. Appears well-developed and well-nourished, in no significant distress Head: Normocephalic and atraumatic  Eyes: Conjunctivae and EOM are normal. Pupils are equal, round, and reactive to light Right Ear: External ear normal.  Left Ear: External ear normal Nose: Nose normal.  Mouth/Throat: Oropharynx is clear and moist  Neck: Normal range of motion. Neck supple. No JVD present. No tracheal deviation present or significant neck LA or mass Cardiovascular: Normal rate, regular rhythm, normal heart sounds and intact distal pulses.   Pulmonary/Chest: Effort normal and breath sounds without rales or wheezing  Abdominal: Soft. Bowel sounds are normal. NT. No HSM  Musculoskeletal: Normal range of motion. Exhibits no edema Lymphadenopathy: Has no cervical adenopathy.  Neurological: Pt is alert and oriented to person, place, and time. Pt has normal reflexes. No cranial nerve deficit. Motor grossly intact Skin: Skin is warm and dry. No rash noted or new ulcers Psychiatric:  Has normal mood and affect. Behavior is normal.     Assessment & Plan:

## 2016-05-11 NOTE — Assessment & Plan Note (Signed)
Lab Results  Component Value Date   TSH 24.43* 12/18/2014   Uncontrolled last visit with med adjustment, for f/u lab

## 2016-05-11 NOTE — Assessment & Plan Note (Signed)
stable overall by history and exam, recent data reviewed with pt, and pt to continue medical treatment as before,  to f/u any worsening symptoms or concerns Lab Results  Component Value Date   LDLCALC 137* 12/18/2014   Declines change in med tx at this time, for f/u labs

## 2016-05-11 NOTE — Progress Notes (Signed)
Pre visit review using our clinic review tool, if applicable. No additional management support is needed unless otherwise documented below in the visit note. 

## 2016-05-11 NOTE — Assessment & Plan Note (Signed)
stable overall by history and exam, recent data reviewed with pt, and pt to continue medical treatment as before,  to f/u any worsening symptoms or concerns Lab Results  Component Value Date   HGBA1C 5.4 07/31/2011

## 2016-06-20 DIAGNOSIS — M1611 Unilateral primary osteoarthritis, right hip: Secondary | ICD-10-CM | POA: Diagnosis not present

## 2016-06-20 DIAGNOSIS — M25551 Pain in right hip: Secondary | ICD-10-CM | POA: Diagnosis not present

## 2016-07-18 ENCOUNTER — Encounter: Payer: Self-pay | Admitting: Internal Medicine

## 2016-07-18 ENCOUNTER — Ambulatory Visit (INDEPENDENT_AMBULATORY_CARE_PROVIDER_SITE_OTHER): Payer: Medicare Other | Admitting: Internal Medicine

## 2016-07-18 ENCOUNTER — Other Ambulatory Visit (INDEPENDENT_AMBULATORY_CARE_PROVIDER_SITE_OTHER): Payer: Medicare Other

## 2016-07-18 VITALS — BP 140/82 | HR 71 | Temp 97.9°F | Resp 20 | Wt 119.0 lb

## 2016-07-18 DIAGNOSIS — I10 Essential (primary) hypertension: Secondary | ICD-10-CM | POA: Diagnosis not present

## 2016-07-18 DIAGNOSIS — E039 Hypothyroidism, unspecified: Secondary | ICD-10-CM

## 2016-07-18 DIAGNOSIS — M25551 Pain in right hip: Secondary | ICD-10-CM

## 2016-07-18 HISTORY — DX: Pain in right hip: M25.551

## 2016-07-18 LAB — T4, FREE: Free T4: 1.02 ng/dL (ref 0.60–1.60)

## 2016-07-18 LAB — TSH: TSH: 0.88 u[IU]/mL (ref 0.35–4.50)

## 2016-07-18 MED ORDER — TRAMADOL HCL 50 MG PO TABS: 50.0000 mg | ORAL_TABLET | Freq: Three times a day (TID) | ORAL | 1 refills | Status: DC | PRN

## 2016-07-18 NOTE — Patient Instructions (Addendum)
Please take all new medication as prescribed - the tramadol  Please make an appt with Dr Tamala Julian in this office for your right hip pain as you leave today  Please continue all other medications as before, and refills have been done if requested.  Please have the pharmacy call with any other refills you may need.  Please keep your appointments with your specialists as you may have planned  Please go to the LAB in the Basement (turn left off the elevator) for the tests to be done today  You will be contacted by phone if any changes need to be made immediately.  Otherwise, you will receive a letter about your results with an explanation, but please check with MyChart first.  Please remember to sign up for MyChart if you have not done so, as this will be important to you in the future with finding out test results, communicating by private email, and scheduling acute appointments online when needed.

## 2016-07-18 NOTE — Progress Notes (Signed)
Pre visit review using our clinic review tool, if applicable. No additional management support is needed unless otherwise documented below in the visit note. 

## 2016-07-18 NOTE — Assessment & Plan Note (Signed)
stable overall by history and exam, recent data reviewed with pt, and pt to continue medical treatment as before,  to f/u any worsening symptoms or concerns For f/u lab today on reduced dose thyroid replacement med

## 2016-07-18 NOTE — Assessment & Plan Note (Signed)
stable overall by history and exam, recent data reviewed with pt, and pt to continue medical treatment as before,  to f/u any worsening symptoms or concerns BP Readings from Last 3 Encounters:  07/18/16 140/82  05/11/16 120/62  09/28/15 128/72

## 2016-07-18 NOTE — Progress Notes (Signed)
Subjective:    Patient ID: Danielle [name], female    DOB: Nov 25, 1935, 80 y.o.   MRN: [id number]  HPI  Here to f/u, has seen ortho in jan 2017 for RLE pain and hip pain, rec'd for PT, later seen in April, wondered about blood clot per pt at the visit but apparently felt not needed for testing at that time,  Rec'd for "pain shot" to the area but she declined, wanted to try yoga instead.  Now pain is worse, and is mostly concerned about a blood clot with 2 family members and she herself personally with DVT post parturition years ago at 80yo, "I have experience with blood clot" and simply does not want other treatment for anything until this is addressed.  Would be willing to f/u with ortho if neg for blood clot. Has had no swelling or redness  And Pt denies chest pain, increased sob or doe, wheezing, orthopnea, PND, increased LE swelling, palpitations, dizziness or syncope.  Pain is worse at the right hip, has to stand to bear wt on left leg only for the most part due to pain on initial wt bearing, and limps to walk. "I just done know if I trust the ortho people I done know". Wt Readings from Last 3 Encounters:  07/18/16 119 lb (54 kg)  05/11/16 120 lb (54.4 kg)  09/28/15 119 lb (54 kg)  Denies hyper or hypo thyroid symptoms such as voice, skin or hair change. Pt denies chest pain, increased sob or doe, wheezing, orthopnea, PND, increased LE swelling, palpitations, dizziness or syncope.  Pt denies new neurological symptoms such as new headache, or facial or extremity weakness or numbness   Past Medical History:  Diagnosis Date  . ALLERGIC RHINITIS 01/09/2008  . ANEMIA-NOS 01/09/2008  . ANXIETY 01/09/2008  . ASTHMATIC BRONCHITIS, ACUTE 10/20/2008  . AV BLOCK, COMPLETE 12/16/2009  . DEPRESSION 01/09/2008  . Dizziness and giddiness 02/25/2010  . DVT, HX OF 01/09/2008  . GERD 01/09/2008  . GLUCOSE INTOLERANCE 01/09/2008  . HYPERLIPIDEMIA 01/09/2008  . HYPERTENSION 01/09/2008  . HYPOTHYROIDISM 01/09/2008    . Impaired glucose tolerance 07/28/2011  . INSOMNIA-SLEEP DISORDER-UNSPEC 06/15/2009  . NEPHROLITHIASIS, HX OF 01/09/2008  . OSTEOARTHRITIS, CERVICAL SPINE 02/25/2010   Past Surgical History:  Procedure Laterality Date  . ERCP W/ SPHICTEROTOMY    . HEMORRHOID SURGERY    . PACEMAKER PLACEMENT     MedTronic EnPulse [id number]  . PERMANENT PACEMAKER GENERATOR CHANGE N/A 07/16/2012   Procedure: PERMANENT PACEMAKER GENERATOR CHANGE;  Surgeon: Deboraha Sprang, MD;  Location: [facility] CATH LAB;  Service: Cardiovascular;  Laterality: N/A;  . POLYPECTOMY     Uterine    reports that she quit smoking about 44 years ago. Her smoking use included Cigarettes. She has a 5.00 pack-year smoking history. She does not have any smokeless tobacco history on file. She reports that she drinks alcohol. Her drug history is not on file. family history includes Cancer in her mother; Pulmonary embolism in her father; Sudden death in her mother. Allergies  Allergen Reactions  . Codeine Nausea And Vomiting  . Oxycodone Nausea And Vomiting   Current Outpatient Prescriptions on File Prior to Visit  Medication Sig Dispense Refill  . aspirin 81 MG EC tablet Take 81 mg by mouth daily.      Marland Kitchen atenolol (TENORMIN) 50 MG tablet Take 1 tablet (50 mg total) by mouth daily. 90 tablet 3  . azelastine (ASTELIN) 0.1 % nasal spray Place 1 spray into both  nostrils 2 (two) times daily. Use in each nostril as directed 30 mL 11  . enalapril (VASOTEC) 10 MG tablet Take 1 tablet (10 mg total) by mouth daily. 90 tablet 3  . levothyroxine (SYNTHROID, LEVOTHROID) 100 MCG tablet Take 1 tablet (100 mcg total) by mouth daily. 90 tablet 3  . omeprazole (PRILOSEC) 20 MG capsule Take 1 capsule (20 mg total) by mouth daily. 90 capsule 3  . zolpidem (AMBIEN CR) 12.5 MG CR tablet take 1 tablet by mouth at bedtime if needed 30 tablet 5   No current facility-administered medications on file prior to visit.    Review of Systems  Constitutional: Negative for  unusual diaphoresis or night sweats HENT: Negative for ear swelling or discharge Eyes: Negative for worsening visual haziness  Respiratory: Negative for choking and stridor.   Gastrointestinal: Negative for distension or worsening eructation Genitourinary: Negative for retention or change in urine volume.  Musculoskeletal: Negative for other MSK pain or swelling Skin: Negative for color change and worsening wound Neurological: Negative for tremors and numbness other than noted  Psychiatric/Behavioral: Negative for decreased concentration or agitation other than above       Objective:   Physical Exam BP 140/82   Pulse 71   Temp 97.9 F (36.6 C) (Oral)   Resp 20   Wt 119 lb (54 kg)   SpO2 97%   BMI 21.08 kg/m  VS noted,  Constitutional: Pt appears in no apparent distress HENT: Head: NCAT.  Right Ear: External ear normal.  Left Ear: External ear normal.  Eyes: . Pupils are equal, round, and reactive to light. Conjunctivae and EOM are normal Neck: Normal range of motion. Neck supple.  Cardiovascular: Normal rate and regular rhythm.   Pulmonary/Chest: Effort normal and breath sounds without rales or wheezing.  Abd:  Soft, NT, ND, + BS Neurological: Pt is alert. Not confused , motor grossly intact Skin: Skin is warm. No rash, no LE edema. Dorsalis pedis 1+ bilat Psychiatric: Pt behavior is normal. No agitation.  + tender marked over right greater trochanter., has full hip flexion, but + pain on reduce hip int and ext rotation     Assessment & Plan:

## 2016-07-18 NOTE — Assessment & Plan Note (Signed)
I suspect elements of both bursitis and hip arthritis, for tramadol prn, refer to Dr Smith/[facility] in this office, do not see need for r/o DVT at this time and she seems satisfied with this

## 2016-07-21 ENCOUNTER — Other Ambulatory Visit: Payer: Self-pay

## 2016-07-25 ENCOUNTER — Telehealth: Payer: Self-pay | Admitting: Cardiology

## 2016-07-25 ENCOUNTER — Encounter: Payer: Medicare Other | Admitting: *Deleted

## 2016-07-25 NOTE — Telephone Encounter (Signed)
LMOVM reminding pt to send remote transmission.   

## 2016-07-28 ENCOUNTER — Encounter: Payer: Self-pay | Admitting: Cardiology

## 2016-08-04 ENCOUNTER — Ambulatory Visit (INDEPENDENT_AMBULATORY_CARE_PROVIDER_SITE_OTHER): Payer: Medicare Other | Admitting: *Deleted

## 2016-08-04 DIAGNOSIS — I442 Atrioventricular block, complete: Secondary | ICD-10-CM

## 2016-08-07 NOTE — Progress Notes (Signed)
Remote pacemaker transmission.   

## 2016-08-08 ENCOUNTER — Encounter: Payer: Self-pay | Admitting: Family Medicine

## 2016-08-08 ENCOUNTER — Ambulatory Visit (INDEPENDENT_AMBULATORY_CARE_PROVIDER_SITE_OTHER): Payer: Medicare Other | Admitting: Family Medicine

## 2016-08-08 DIAGNOSIS — M25551 Pain in right hip: Secondary | ICD-10-CM

## 2016-08-08 MED ORDER — VITAMIN D (ERGOCALCIFEROL) 1.25 MG (50000 UNIT) PO CAPS: 50000.0000 [IU] | ORAL_CAPSULE | ORAL | 0 refills | Status: DC

## 2016-08-08 NOTE — Patient Instructions (Addendum)
God to see you  Ice 20 minutes 2 times daily. Usually after activity and before bed. Exercises 3 times a week.  pennsaid pinkie amount topically 2 times daily as needed.  Once weekly vitamin D for next 12 weeks Over the counter Turmeric 500mg  twice daily  Tart cherry extract any dose at night Spenco orthotics "total support" online would be great  Keep up with the water aerobics or yoga as well.  See me againin 4 weeks to make sure you are doing better.

## 2016-08-08 NOTE — Progress Notes (Signed)
[facility] [address] [place], [place] [address] Phone: [phone] Subjective:    I'm seeing this patient by the request  of:  Cathlean Cower, MD   CC: Right hip pain  [id number]  Danielle [name] is a 80 y.o. female coming in with complaint of right hip pain. Patient states she's had this pain intermittently for the last 9 months. Did see another provider and attempted to do some core strengthening with yoga. States that the pain seems to be worse. Patient's on primary care provider and was given some tramadol. Not taking. Patient was to continue to be active. States that the pain mostly is in the groin area when doing certain things in a flexed position. Denies any significant radiation down the leg or any numbness. Denies any back pain that seems to be associated with it. Patient does state that that she does have some mild pain on the lateral aspect of her hip as well. Waking her up at night. Also denies any radiation in this area. States though that she is concerned she is doing less activity secondary to the pain. Patient's daughter is a Art gallery manager and has started her doing some range of motion that seems to be doing somewhat better. The severity of pain a 7 out of 10.     Past Medical History:  Diagnosis Date  . ALLERGIC RHINITIS 01/09/2008  . ANEMIA-NOS 01/09/2008  . ANXIETY 01/09/2008  . ASTHMATIC BRONCHITIS, ACUTE 10/20/2008  . AV BLOCK, COMPLETE 12/16/2009  . DEPRESSION 01/09/2008  . Dizziness and giddiness 02/25/2010  . DVT, HX OF 01/09/2008  . GERD 01/09/2008  . GLUCOSE INTOLERANCE 01/09/2008  . HYPERLIPIDEMIA 01/09/2008  . HYPERTENSION 01/09/2008  . HYPOTHYROIDISM 01/09/2008  . Impaired glucose tolerance 07/28/2011  . INSOMNIA-SLEEP DISORDER-UNSPEC 06/15/2009  . NEPHROLITHIASIS, HX OF 01/09/2008  . OSTEOARTHRITIS, CERVICAL SPINE 02/25/2010   Past Surgical History:  Procedure Laterality Date  . ERCP W/ SPHICTEROTOMY    . HEMORRHOID SURGERY      . PACEMAKER PLACEMENT     MedTronic EnPulse [id number]  . PERMANENT PACEMAKER GENERATOR CHANGE N/A 07/16/2012   Procedure: PERMANENT PACEMAKER GENERATOR CHANGE;  Surgeon: Deboraha Sprang, MD;  Location: [facility] CATH LAB;  Service: Cardiovascular;  Laterality: N/A;  . POLYPECTOMY     Uterine   Social History   Social History  . Marital status: Married    Spouse name: N/A  . Number of children: N/A  . Years of education: N/A   Social History Main Topics  . Smoking status: Former Smoker    Packs/day: 0.20    Years: 25.00    Types: Cigarettes    Quit date: 11/21/1971  . Smokeless tobacco: None  . Alcohol use Yes  . Drug use: Unknown  . Sexual activity: Not Asked   Other Topics Concern  . None   Social History Narrative  . None   Allergies  Allergen Reactions  . Codeine Nausea And Vomiting  . Oxycodone Nausea And Vomiting   Family History  Problem Relation Age of Onset  . Sudden death Mother   . Cancer Mother     Breast  . Pulmonary embolism Father     Possible    Past medical history, social, surgical and family history all reviewed in electronic medical record.  No pertanent information unless stated regarding to the chief complaint.   Review of Systems: No headache, visual changes, nausea, vomiting, diarrhea, constipation, dizziness, abdominal pain, skin rash, fevers, chills, night  sweats, weight loss, swollen lymph nodes, body aches, joint swelling, muscle aches, chest pain, shortness of breath, mood changes.   Objective  Blood pressure 128/72, pulse 83, weight 118 lb (53.5 kg), SpO2 97 %.  General: No apparent distress alert and oriented x3 mood and affect normal, dressed appropriately.  HEENT: Pupils equal, extraocular movements intact  Respiratory: Patient's speak in full sentences and does not appear short of breath  Cardiovascular: No lower extremity edema, non tender, no erythema  Skin: Warm dry intact with no signs of infection or rash on extremities or on axial  skeleton.  Abdomen: Soft nontender  Neuro: Cranial nerves II through XII are intact, neurovascularly intact in all extremities with 2+ DTRs and 2+ pulses.  Lymph: No lymphadenopathy of posterior or anterior cervical chain or axillae bilaterally.  Gait Mild antalgic gait.  MSK:  Non tender with full range of motion and good stability and symmetric strength and tone of shoulders, elbows, wrist,  knee and ankles bilaterally. Arthritic changes of multiple joints  Hip: right  ROM IR: 15 Deg with pain , ER: 35 Deg, Flexion: 120 Deg, Extension: 80 Deg, Abduction: 45 Deg, Adduction: 25 Deg Strength IR: 5/5, ER: 5/5, Flexion: 5/5, Extension: 5/5, Abduction: 4/5, Adduction: 4/5 Pelvic alignment unremarkable to inspection and palpation. Standing hip rotation and gait without trendelenburg sign / unsteadiness. Greater trochanter without tenderness to palpation. No tenderness over piriformis and greater trochanter. Pain with both Corky Sox and internal rotation No SI joint tenderness and normal minimal SI movement.  Procedure note [id number]; 15 minutes spent for Therapeutic exercises as stated in above notes.  This included exercises focusing on stretching, strengthening, with significant focus on eccentric aspects. Hip strengthening exercises which included:  Pelvic tilt/bracing to help with proper recruitment of the lower abs and pelvic floor muscles  Glute strengthening to properly contract glutes without over-engaging low back and hamstrings - prone hip extension and glute bridge exercises Proper stretching techniques to increase effectiveness for the hip flexors, groin, quads, piriformic and low back when appropriate     Proper technique shown and discussed handout in great detail with ATC.  All questions were discussed and answered.      Impression and Recommendations:     This case required medical decision making of moderate complexity.      Note: This dictation was prepared with Dragon  dictation along with smaller phrase technology. Any transcriptional errors that result from this process are unintentional.

## 2016-08-08 NOTE — Assessment & Plan Note (Signed)
Right hip pain noted. Patient right hip pain seems to be multifactorial. I'm concerned that there is some underlying arthritis of the hip. We'll hold on x-rays today to help. I also think the patient does have a greater trochanter bursitis. Patient given topical anti-inflammatories, icing, vitamin D supplementation, we discussed core strengthening exercises. Patient work with Product/process development scientist to learn in greater detail. Patient and will come back and see me again in 4-6 weeks. Worsening symptoms we'll consider injection of the greater trochanteric bursa and x-rays.

## 2016-08-09 ENCOUNTER — Telehealth: Payer: Self-pay | Admitting: *Deleted

## 2016-08-09 MED ORDER — VITAMIN D (ERGOCALCIFEROL) 1.25 MG (50000 UNIT) PO CAPS: 50000.0000 [IU] | ORAL_CAPSULE | ORAL | 0 refills | Status: DC

## 2016-08-09 NOTE — Telephone Encounter (Signed)
Pt left msg on triage stating saw Dr. Tamala Julian yesterday the medication he suppose to have sent [facility] npt receive. Requesting med to be resent...Danielle [name]

## 2016-08-10 NOTE — Telephone Encounter (Signed)
Rec'd [facility] from [name] [facility] she states pt inform MD was going to send rx on her Tramadol cut thye never received. Per chart MD ok refill on 8/29, she stated per there records they never receive. Gave her md authorization from 8/29...Danielle [name]

## 2016-08-11 LAB — CUP PACEART REMOTE DEVICE CHECK
Battery Impedance: 347 Ohm
Battery Voltage: 2.77 V
Brady Statistic AP VS Percent: 0 %
Brady Statistic AS VP Percent: 41 %
Date Time Interrogation Session: 20170915164731
Implantable Lead Implant Date: 19970117
Implantable Lead Location: 753859
Implantable Lead Location: 753860
Implantable Lead Model: 4068
Implantable Lead Model: 4068
Lead Channel Impedance Value: 693 Ohm
Lead Channel Pacing Threshold Amplitude: 2.25 V
Lead Channel Pacing Threshold Pulse Width: 0.4 ms
Lead Channel Pacing Threshold Pulse Width: 0.4 ms
Lead Channel Setting Pacing Amplitude: 3 V
Lead Channel Setting Pacing Pulse Width: 0.4 ms
MDC IDC LEAD IMPLANT DT: 19970117
MDC IDC MSMT BATTERY REMAINING LONGEVITY: 76 mo
MDC IDC MSMT LEADCHNL RV IMPEDANCE VALUE: 889 Ohm
MDC IDC MSMT LEADCHNL RV PACING THRESHOLD AMPLITUDE: 1.5 V
MDC IDC SET LEADCHNL RA PACING AMPLITUDE: 4.5 V
MDC IDC SET LEADCHNL RV SENSING SENSITIVITY: 5.6 mV
MDC IDC STAT BRADY AP VP PERCENT: 59 %
MDC IDC STAT BRADY AS VS PERCENT: 0 %

## 2016-08-16 ENCOUNTER — Encounter: Payer: Self-pay | Admitting: Cardiology

## 2016-08-30 ENCOUNTER — Encounter: Payer: Self-pay | Admitting: Cardiology

## 2016-09-04 DIAGNOSIS — J3089 Other allergic rhinitis: Secondary | ICD-10-CM | POA: Diagnosis not present

## 2016-09-04 DIAGNOSIS — J3081 Allergic rhinitis due to animal (cat) (dog) hair and dander: Secondary | ICD-10-CM | POA: Diagnosis not present

## 2016-09-04 NOTE — Progress Notes (Signed)
[facility] [address] [place], [place] [address] Phone: [phone] Subjective:    I'm seeing this patient by the request  of:  Cathlean Cower, MD   CC: Right hip pain f/u  [id number]  Danielle [name] is a 80 y.o. female coming in with complaint of right hip pain. Patient sent have more of a greater trochanteric bursitis as well as potential hip arthritis. Patient elected to do conservative therapy. Has tramadol for breakthrough pain but we did add in vitamin D weekly. Patient does take over-the-counter medications. Patient states She is doing better. States in the last week she has noticed improvement. Feels like some of it is the vitamin D as well as staying active and doing the formal physical therapy. Patient feels like she is still having some groin pain. Wondering if anything else can be done. Feels that the outside of the hip has been more beneficial.     Past Medical History:  Diagnosis Date  . ALLERGIC RHINITIS 01/09/2008  . ANEMIA-NOS 01/09/2008  . ANXIETY 01/09/2008  . ASTHMATIC BRONCHITIS, ACUTE 10/20/2008  . AV BLOCK, COMPLETE 12/16/2009  . DEPRESSION 01/09/2008  . Dizziness and giddiness 02/25/2010  . DVT, HX OF 01/09/2008  . GERD 01/09/2008  . GLUCOSE INTOLERANCE 01/09/2008  . HYPERLIPIDEMIA 01/09/2008  . HYPERTENSION 01/09/2008  . HYPOTHYROIDISM 01/09/2008  . Impaired glucose tolerance 07/28/2011  . INSOMNIA-SLEEP DISORDER-UNSPEC 06/15/2009  . NEPHROLITHIASIS, HX OF 01/09/2008  . OSTEOARTHRITIS, CERVICAL SPINE 02/25/2010   Past Surgical History:  Procedure Laterality Date  . ERCP W/ SPHICTEROTOMY    . HEMORRHOID SURGERY    . PACEMAKER PLACEMENT     MedTronic EnPulse [id number]  . PERMANENT PACEMAKER GENERATOR CHANGE N/A 07/16/2012   Procedure: PERMANENT PACEMAKER GENERATOR CHANGE;  Surgeon: Deboraha Sprang, MD;  Location: [facility] CATH LAB;  Service: Cardiovascular;  Laterality: N/A;  . POLYPECTOMY     Uterine   Social History   Social History    . Marital status: Married    Spouse name: N/A  . Number of children: N/A  . Years of education: N/A   Social History Main Topics  . Smoking status: Former Smoker    Packs/day: 0.20    Years: 25.00    Types: Cigarettes    Quit date: 11/21/1971  . Smokeless tobacco: None  . Alcohol use Yes  . Drug use: Unknown  . Sexual activity: Not Asked   Other Topics Concern  . None   Social History Narrative  . None   Allergies  Allergen Reactions  . Codeine Nausea And Vomiting  . Oxycodone Nausea And Vomiting   Family History  Problem Relation Age of Onset  . Sudden death Mother   . Cancer Mother     Breast  . Pulmonary embolism Father     Possible    Past medical history, social, surgical and family history all reviewed in electronic medical record.  No pertanent information unless stated regarding to the chief complaint.   Review of Systems: No headache, visual changes, nausea, vomiting, diarrhea, constipation, dizziness, abdominal pain, skin rash, fevers, chills, night sweats, weight loss, swollen lymph nodes, body aches, joint swelling, muscle aches, chest pain, shortness of breath, mood changes.   Objective  Blood pressure 122/78, pulse 86, weight 119 lb (54 kg), SpO2 98 %.  General: No apparent distress alert and oriented x3 mood and affect normal, dressed appropriately.  HEENT: Pupils equal, extraocular movements intact  Respiratory: Patient's speak in full sentences and  does not appear short of breath  Cardiovascular: No lower extremity edema, non tender, no erythema  Skin: Warm dry intact with no signs of infection or rash on extremities or on axial skeleton.  Abdomen: Soft nontender  Neuro: Cranial nerves II through XII are intact, neurovascularly intact in all extremities with 2+ DTRs and 2+ pulses.  Lymph: No lymphadenopathy of posterior or anterior cervical chain or axillae bilaterally.  Gait Mild antalgic gait.  MSK:  Non tender with full range of motion and good  stability and symmetric strength and tone of shoulders, elbows, wrist,  knee and ankles bilaterally. Arthritic changes of multiple joints  Hip: right  ROM IR: 15 Deg with Mild pain, ER: 35 Deg, Flexion: 120 Deg, Extension: 80 Deg, Abduction: 45 Deg, Adduction: 25 Deg Strength IR: 5/5, ER: 5/5, Flexion: 5/5, Extension: 5/5, Abduction: 4/5, Adduction: 4/5 Pelvic alignment unremarkable to inspection and palpation. Standing hip rotation and gait without trendelenburg sign / unsteadiness. Greater trochanter without tenderness to palpation. No tenderness over piriformis and greater trochanter. Minimal pain with faber.  No SI joint tenderness and normal minimal SI movement.      Impression and Recommendations:     This case required medical decision making of moderate complexity.      Note: This dictation was prepared with Dragon dictation along with smaller phrase technology. Any transcriptional errors that result from this process are unintentional.

## 2016-09-05 ENCOUNTER — Ambulatory Visit
Admission: RE | Admit: 2016-09-05 | Discharge: 2016-09-05 | Disposition: A | Discharge status: Home / Self Care | Payer: Medicare Other | Source: Ambulatory Visit | Attending: Family Medicine | Admitting: Family Medicine

## 2016-09-05 ENCOUNTER — Encounter: Payer: Self-pay | Admitting: Family Medicine

## 2016-09-05 ENCOUNTER — Ambulatory Visit (INDEPENDENT_AMBULATORY_CARE_PROVIDER_SITE_OTHER): Payer: Medicare Other | Admitting: Family Medicine

## 2016-09-05 VITALS — BP 122/78 | HR 86 | Wt 119.0 lb

## 2016-09-05 DIAGNOSIS — M25551 Pain in right hip: Secondary | ICD-10-CM

## 2016-09-05 MED ORDER — VITAMIN D (ERGOCALCIFEROL) 1.25 MG (50000 UNIT) PO CAPS: 50000.0000 [IU] | ORAL_CAPSULE | ORAL | 0 refills | Status: DC

## 2016-09-05 NOTE — Patient Instructions (Addendum)
good to see you  Ice is your friend Continue the same regimen for now Refilled the vitamin D Lets get a xray downstairs See em agai nin 2-3 months!

## 2016-09-05 NOTE — Assessment & Plan Note (Signed)
X-rays ordered today with her x-rays being greater than 80 years old. Making progress. Continue conservative therapy. Follow-up again in 2 months to make sure patient is doing well.

## 2016-09-14 ENCOUNTER — Other Ambulatory Visit: Payer: Self-pay | Admitting: Nurse Practitioner

## 2016-09-14 DIAGNOSIS — N63 Unspecified lump in unspecified breast: Secondary | ICD-10-CM | POA: Diagnosis not present

## 2016-09-14 DIAGNOSIS — N6310 Unspecified lump in the right breast, unspecified quadrant: Secondary | ICD-10-CM

## 2016-09-19 ENCOUNTER — Other Ambulatory Visit: Payer: Self-pay | Admitting: Nurse Practitioner

## 2016-09-19 ENCOUNTER — Ambulatory Visit
Admission: RE | Admit: 2016-09-19 | Discharge: 2016-09-19 | Disposition: A | Discharge status: Home / Self Care | Payer: Medicare Other | Source: Ambulatory Visit | Attending: Nurse Practitioner | Admitting: Nurse Practitioner

## 2016-09-19 DIAGNOSIS — N6310 Unspecified lump in the right breast, unspecified quadrant: Secondary | ICD-10-CM

## 2016-09-19 DIAGNOSIS — R921 Mammographic calcification found on diagnostic imaging of breast: Secondary | ICD-10-CM | POA: Diagnosis not present

## 2016-09-19 DIAGNOSIS — N6311 Unspecified lump in the right breast, upper outer quadrant: Secondary | ICD-10-CM | POA: Diagnosis not present

## 2016-09-19 DIAGNOSIS — N6312 Unspecified lump in the right breast, upper inner quadrant: Secondary | ICD-10-CM | POA: Diagnosis not present

## 2016-09-20 ENCOUNTER — Ambulatory Visit
Admission: RE | Admit: 2016-09-20 | Discharge: 2016-09-20 | Disposition: A | Discharge status: Home / Self Care | Payer: Medicare Other | Source: Ambulatory Visit | Attending: Nurse Practitioner | Admitting: Nurse Practitioner

## 2016-09-20 DIAGNOSIS — R921 Mammographic calcification found on diagnostic imaging of breast: Secondary | ICD-10-CM

## 2016-09-20 DIAGNOSIS — N6310 Unspecified lump in the right breast, unspecified quadrant: Secondary | ICD-10-CM

## 2016-09-20 DIAGNOSIS — C50211 Malignant neoplasm of upper-inner quadrant of right female breast: Secondary | ICD-10-CM | POA: Diagnosis not present

## 2016-09-20 DIAGNOSIS — D0511 Intraductal carcinoma in situ of right breast: Secondary | ICD-10-CM | POA: Diagnosis not present

## 2016-09-20 HISTORY — PX: BREAST BIOPSY: SHX20

## 2016-09-27 ENCOUNTER — Encounter: Payer: Self-pay | Admitting: Genetic Counselor

## 2016-09-29 ENCOUNTER — Ambulatory Visit: Payer: Self-pay | Admitting: General Surgery

## 2016-09-29 ENCOUNTER — Telehealth: Payer: Self-pay | Admitting: Hematology and Oncology

## 2016-09-29 DIAGNOSIS — Z171 Estrogen receptor negative status [ER-]: Secondary | ICD-10-CM | POA: Diagnosis not present

## 2016-09-29 DIAGNOSIS — C50911 Malignant neoplasm of unspecified site of right female breast: Secondary | ICD-10-CM | POA: Diagnosis not present

## 2016-09-29 NOTE — Telephone Encounter (Signed)
Received a call from [place] at [name] about an urgent referral for this pt. Appt scheduled for 11/17 w/[place] at 3pm. Danielle [name] will notify the pt with an arrival of 245pm.

## 2016-10-03 DIAGNOSIS — C50911 Malignant neoplasm of unspecified site of right female breast: Secondary | ICD-10-CM | POA: Diagnosis not present

## 2016-10-06 ENCOUNTER — Ambulatory Visit: Payer: Medicare Other | Admitting: Hematology and Oncology

## 2016-10-06 ENCOUNTER — Encounter: Payer: Self-pay | Admitting: *Deleted

## 2016-10-06 DIAGNOSIS — C50211 Malignant neoplasm of upper-inner quadrant of right female breast: Secondary | ICD-10-CM | POA: Insufficient documentation

## 2016-10-06 DIAGNOSIS — C50219 Malignant neoplasm of upper-inner quadrant of unspecified female breast: Secondary | ICD-10-CM

## 2016-10-06 HISTORY — DX: Malignant neoplasm of upper-inner quadrant of unspecified female breast: C50.219

## 2016-10-06 NOTE — Assessment & Plan Note (Deleted)
Right breast biopsy: 12:30 position: IDC grade 3, ER 0%, PR 0%, HER-2 negative ratio 1.24. High-grade DCIS with comedonecrosis and calcifications upper outer quadrant. Palpable abnormality 12:30 position right breast mass with satellite nodule. Mass measures 1.5 x 1 x 1.2 cm and satellite nodule 0.7 cm, axilla ultrasound negative. Suspicious calcifications upper outer quadrant Clinical stage: T1c N0 stage IA  Pathology and radiology counseling: Discussed with the patient, the details of pathology including the type of breast cancer,the clinical staging, the significance of ER, PR and HER-2/neu receptors and the implications for treatment. After reviewing the pathology in detail, we proceeded to discuss the different treatment options between surgery, radiation, chemotherapy, antiestrogen therapies.  Recommendation: 1. Breast conserving surgery with sentinel lymph node study 2. Adjuvant chemotherapy with dose dense Adriamycin and Cytoxan 4 followed by Taxol weekly 12 3. Followed by surveillance  Return to clinic after surgery to discuss the final pathology report.

## 2016-10-09 ENCOUNTER — Telehealth: Payer: Self-pay | Admitting: Hematology and Oncology

## 2016-10-09 NOTE — Telephone Encounter (Signed)
Pt returned my call and stated that she switched surgeons for a 2nd opinion. She changed to Dr. Barry Dienes and would like to hold of appt until she sees her. Referral cancelled.

## 2016-10-17 ENCOUNTER — Telehealth: Payer: Self-pay | Admitting: Internal Medicine

## 2016-10-17 NOTE — Telephone Encounter (Signed)
Called Danielle [name] to schedule awv appt. Left message for patient to call office to schedule appt.

## 2016-10-27 ENCOUNTER — Encounter: Payer: Self-pay | Admitting: Internal Medicine

## 2016-10-31 ENCOUNTER — Other Ambulatory Visit: Payer: Self-pay | Admitting: General Surgery

## 2016-10-31 DIAGNOSIS — C50911 Malignant neoplasm of unspecified site of right female breast: Secondary | ICD-10-CM | POA: Diagnosis not present

## 2016-10-31 DIAGNOSIS — Z171 Estrogen receptor negative status [ER-]: Principal | ICD-10-CM

## 2016-10-31 DIAGNOSIS — C50211 Malignant neoplasm of upper-inner quadrant of right female breast: Secondary | ICD-10-CM

## 2016-10-31 NOTE — H&P (Signed)
PEITON ANSELMO 10/31/2016 10:27 AM Location: [facility] Patient #: [id number] DOB: [birth year] Married / Language: Cleophus Molt / Race: White Female   History of Present Illness [name] Klein MD; 10/31/2016 4:19 PM) The patient is a 80 year old female who presents with breast cancer. Patient is a 80 year old female who presents for a second opinion regarding her right breast cancer. She saw Dr. Excell Seltzer, and was recommended to have a right-sided mastectomy. She currently has no pain, but itching from the biopsy site. She presented with a palpable mass in the right breast. Her diagnostic imaging revealed a 1.5 cm mass in the upper inner quadrant with a 0.7 cm satellite mass and a 2 cm area of linear calcifications in the upper outer quadrant. There are some additional pleomorphic calcifications that extended towards the nipple, for a total of 6 cm of calcifications. Biopsy of the palpable mass size invasive ductal carcinoma, triple negative. The biopsy of the calcifications was positive for hormone negative high grade DCIS. Axillary nodes were negative on imaging. Her mother had breast cancer.    U/S and dx mammo FINDINGS: Within the upper central portion of the right breast there is an irregular mass marked as palpable with a BB. On tomosynthesis images mass is spiculated a measures at least 2 cm. Adjacent to the larger component there is a small satellite nodule.  In the upper outer quadrant of the right breast there are numerous linear calcifications further evaluated on magnified views. On these views abnormal calcification span at least 2.0 x 1.2 x 1.6 cm. However there are additional scattered rounded calcification extending from this region in the upper-outer quadrant towards the nipple. Measured together, all calcifications measure 6.2 cm. The left breast is negative.  Mammographic images were processed with CAD.  On physical exam, I palpate a rounded mobile mass in the  upper inner quadrant of the right breast.  Targeted ultrasound is performed, showing an irregular hypoechoic mass in the 12:30 o'clock location of the right breast 5 cm from nipple which measures 1.5 x 1.0 x 1.2 cm. The adjacent satellite nodule is 0.7 cm. Internal blood flow is identified on Doppler evaluation. Evaluation of the axilla is negative for adenopathy. No sonographic correlate is identified for the calcifications in the upper-outer quadrant of the right breast.  IMPRESSION: 1. Palpable abnormality in the 12:30 o'clock location of the right breast is a mass with adjacent satellite nodule. Ultrasound-guided core biopsy is recommended for the larger component. 2. Suspicious calcifications in the upper-outer quadrant of the right breast for which stereotactic guided core biopsy is recommended.  RECOMMENDATION: 1. Ultrasound-guided core biopsy of mass in the 12:30 o'clock location of the right breast. 2. Stereotactic guided core biopsy of calcifications in the upper-outer quadrant of the right breast.  pathology Diagnosis 1. Breast, right, needle core biopsy, UOQ - HIGH GRADE DUCTAL CARCINOMA IN SITU WITH COMEDONECROSIS AND CALCIFICATIONS. 2. Breast, right, needle core biopsy, 12:30 o'clock - INVASIVE DUCTAL CARCINOMA, SEE COMMENT. Microscopic Comment 1. Prognostic markers will be ordered. 2. While grading is best performed on the resection specimen, the carcinoma appears grade 3.   Allergies Patsey Berthold, CMA; 10/31/2016 10:28 AM) Codeine Phosphate *ANALGESICS - OPIOID*  Vomiting. OxyCODONE HCl *ANALGESICS - OPIOID*  Vomiting.  Medication History Patsey Berthold, CMA; 10/31/2016 10:29 AM) Atenolol (50MG  Tablet, Oral) Active. Azelastine HCl (0.1% Solution, Nasal) Active. Enalapril Maleate (10MG  Tablet, Oral) Active. Levothyroxine Sodium (100MCG Tablet, Oral) Active. Vitamin D (Ergocalciferol) (50000UNIT Capsule, Oral) Active. Zolpidem Tartrate ER (12.5MG   Tablet ER, Oral as needed) Active. Aspirin (81MG  Tablet, Oral) Active. Omeprazole (20MG  Tablet DR, Oral) Active. Medications Reconciled    Review of Systems [name] Klein MD; 10/31/2016 4:19 PM) All other systems negative  Vitals Patsey Berthold CMA; 10/31/2016 10:29 AM) 10/31/2016 10:29 AM Weight: 122.4 lb Height: 63in Body Surface Area: 1.57 m Body Mass Index: 21.68 kg/m  Temp.: 98.29F  Pulse: 82 (Regular)  BP: 140/62 (Sitting, Left Arm, Standard)       Physical Exam [name] Klein MD; 10/31/2016 4:21 PM) General Mental Status-Alert. General Appearance-Consistent with stated age. Hydration-Well hydrated. Voice-Normal.  Head and Neck Head-normocephalic, atraumatic with no lesions or palpable masses.  Eye Sclera/Conjunctiva - Bilateral-No scleral icterus.  Chest and Lung Exam Chest and lung exam reveals -quiet, even and easy respiratory effort with no use of accessory muscles. Inspection Chest Wall - Normal. Back - normal.  Breast Note: Right breast -palpable 2 cm mass upper inner quadrant around 1 o'clock. mobile. firm. breast tissue denser in upper outer quadrant, and small old hematoma present. No LAD. No nipple retraction, nipple discharge, or skin dimpling. breasts are reasonably symmetric in size and ptosis, except there is a small contour difference at the location of the cancer. left breast - Nl, but dense.   Cardiovascular Cardiovascular examination reveals -normal pedal pulses bilaterally. Note: regular rate and rhythm  Abdomen Inspection-Inspection Normal. Palpation/Percussion Palpation and Percussion of the abdomen reveal - Soft, Non Tender, No Rebound tenderness, No Rigidity (guarding) and No hepatosplenomegaly.  Peripheral Vascular Upper Extremity Inspection - Bilateral - Normal - No Clubbing, No Cyanosis, No Edema, Pulses Intact. Lower Extremity Palpation - Edema - Bilateral - No edema.  Neurologic Neurologic  evaluation reveals -alert and oriented x 3 with no impairment of recent or remote memory. Mental Status-Normal.  Musculoskeletal Global Assessment -Note: no gross deformities.  Normal Exam - Left-Upper Extremity Strength Normal and Lower Extremity Strength Normal. Normal Exam - Right-Upper Extremity Strength Normal and Lower Extremity Strength Normal.  Lymphatic Head & Neck  General Head & Neck Lymphatics: Bilateral - Description - Normal. Axillary  General Axillary Region: Bilateral - Description - Normal. Tenderness - Non Tender.    Assessment & Plan [name] Klein MD; 10/31/2016 4:28 PM) CANCER OF RIGHT BREAST, STAGE 1, ESTROGEN RECEPTOR NEGATIVE (C50.911) Impression: Reviewed with patient her imaging findings on an anatomical diagram of the breast. I discussed that the known cancer is small in the upper inner quadrant. However, the area of known DCIS is 2 cm, but there are more suspicious calcifications heading toward the nipple. I discussed that if she wanted to try to pursue a double lumpectomy, that we would need to biopsy these calcifications anteriorly. I also discussed that normally an MRI might be helpful in this situation. She is unable to get an MRI because of the pacemaker. Discussed imaging with radiology.  I also reviewed that sometimes we find in attempting to pursue 2 lumpectomies in a small breast, that we frequently have issues with margins. She would be at very high risk to need additional surgeries, and we would have a breast that was not good cosmetically or functionally in trying to find a bra or prosthesis to fit.  She states that she has not concerned cosmetically about her breast and really does not want to get additional biopsies if it is not likely to simplify her surgery. She does think that it would be attractive to have fever surgeries. I discussed the risk of mastectomy including bleeding and infection as well as  seroma. Reviewed that she would be  in the hospital overnight and have it drained. I discussed the risk for additional procedures. I also discussed postoperative restrictions. She does have several horses, but does not currently ride them. She notes that she has a history of clot in the past following surgery.  She will need to see oncology pre op in order to evaluate for chemotherapy. We could place a port at the time of surgery.  45 min spent in evaluation, examination, counseling, and coordination of care. >50% spent in counseling. Current Plans Referred to Oncology, for evaluation and follow up (Oncology). Routine. Pt Education - CCS Mastectomy HCI   Signed by [name] Klein, MD (10/31/2016 4:29 PM)

## 2016-11-02 ENCOUNTER — Encounter: Payer: Self-pay | Admitting: Internal Medicine

## 2016-11-02 ENCOUNTER — Ambulatory Visit (INDEPENDENT_AMBULATORY_CARE_PROVIDER_SITE_OTHER): Payer: Medicare Other | Admitting: Internal Medicine

## 2016-11-02 VITALS — BP 142/70 | HR 85 | Ht 63.0 in | Wt 123.4 lb

## 2016-11-02 DIAGNOSIS — Z950 Presence of cardiac pacemaker: Secondary | ICD-10-CM | POA: Diagnosis not present

## 2016-11-02 DIAGNOSIS — I442 Atrioventricular block, complete: Secondary | ICD-10-CM | POA: Diagnosis not present

## 2016-11-02 LAB — CUP PACEART INCLINIC DEVICE CHECK
Battery Impedance: 371 Ohm
Brady Statistic AP VP Percent: 61 %
Brady Statistic AP VS Percent: 0 %
Brady Statistic AS VP Percent: 39 %
Brady Statistic AS VS Percent: 0 %
Implantable Lead Location: 753859
Implantable Lead Location: 753860
Implantable Lead Model: 4068
Lead Channel Impedance Value: 916 Ohm
Lead Channel Pacing Threshold Amplitude: 2.25 V
Lead Channel Pacing Threshold Amplitude: 2.5 V
Lead Channel Pacing Threshold Pulse Width: 0.4 ms
Lead Channel Pacing Threshold Pulse Width: 0.4 ms
Lead Channel Sensing Intrinsic Amplitude: 2 mV
Lead Channel Setting Pacing Amplitude: 2.5 V
Lead Channel Setting Pacing Pulse Width: 0.4 ms
Lead Channel Setting Sensing Sensitivity: 5.6 mV
MDC IDC LEAD IMPLANT DT: 19970117
MDC IDC LEAD IMPLANT DT: 19970117
MDC IDC MSMT BATTERY REMAINING LONGEVITY: 68 mo
MDC IDC MSMT BATTERY VOLTAGE: 2.77 V
MDC IDC MSMT LEADCHNL RA IMPEDANCE VALUE: 681 Ohm
MDC IDC MSMT LEADCHNL RA PACING THRESHOLD AMPLITUDE: 1.75 V
MDC IDC MSMT LEADCHNL RA PACING THRESHOLD PULSEWIDTH: 0.64 ms
MDC IDC MSMT LEADCHNL RV PACING THRESHOLD AMPLITUDE: 1.5 V
MDC IDC MSMT LEADCHNL RV PACING THRESHOLD PULSEWIDTH: 0.4 ms
MDC IDC PG IMPLANT DT: 20130827
MDC IDC SESS DTM: 20171214164024
MDC IDC SET LEADCHNL RV PACING AMPLITUDE: 3 V

## 2016-11-02 NOTE — Patient Instructions (Addendum)
Medication Instructions:    Your physician recommends that you continue on your current medications as directed. Please refer to the Current Medication list given to you today.  --- If you need a refill on your cardiac medications before your next appointment, please call your pharmacy. ---  Labwork:  None ordered  Testing/Procedures:  None ordered  Follow-Up: Remote monitoring is used to monitor your Pacemaker of ICD from home. This monitoring reduces the number of office visits required to check your device to one time per year. It allows [name] to keep an eye on the functioning of your device to ensure it is working properly. You are scheduled for a device check from home on 02/01/17. You may send your transmission at any time that day. If you have a wireless device, the transmission will be sent automatically. After your physician reviews your transmission, you will receive a postcard with your next transmission date.   Your physician wants you to follow-up in: 1 year with Dr. Caryl Comes.  You will receive a reminder letter in the mail two months in advance. If you don't receive a letter, please call our office to schedule the follow-up appointment.   Thank you for choosing [facility]!!

## 2016-11-02 NOTE — Progress Notes (Signed)
Patient Care Team: Biagio Borg, MD as PCP - General   HPI  Danielle [name] is a 80 y.o. female is seen in followup for complete heart block for which she is status post pacemaker.  She underwent device generator replacement 8/13 I.   discoloration of both leads were noted; lead function was normal   The patient denies chest pain, shortness of breath, nocturnal dyspnea, orthopnea or peripheral edema. There have been no palpitations, lightheadedness or syncope.    She has been diagnosed with breast cancer. She has anticipated surgery next week.   Past Medical History:  Diagnosis Date  . ALLERGIC RHINITIS 01/09/2008  . ANEMIA-NOS 01/09/2008  . ANXIETY 01/09/2008  . ASTHMATIC BRONCHITIS, ACUTE 10/20/2008  . AV BLOCK, COMPLETE 12/16/2009  . DEPRESSION 01/09/2008  . Dizziness and giddiness 02/25/2010  . DVT, HX OF 01/09/2008  . GERD 01/09/2008  . GLUCOSE INTOLERANCE 01/09/2008  . HYPERLIPIDEMIA 01/09/2008  . HYPERTENSION 01/09/2008  . HYPOTHYROIDISM 01/09/2008  . Impaired glucose tolerance 07/28/2011  . INSOMNIA-SLEEP DISORDER-UNSPEC 06/15/2009  . NEPHROLITHIASIS, HX OF 01/09/2008  . OSTEOARTHRITIS, CERVICAL SPINE 02/25/2010    Past Surgical History:  Procedure Laterality Date  . ERCP W/ SPHICTEROTOMY    . HEMORRHOID SURGERY    . PACEMAKER PLACEMENT     MedTronic EnPulse [id number]  . PERMANENT PACEMAKER GENERATOR CHANGE N/A 07/16/2012   Procedure: PERMANENT PACEMAKER GENERATOR CHANGE;  Surgeon: Deboraha Sprang, MD;  Location: [facility] CATH LAB;  Service: Cardiovascular;  Laterality: N/A;  . POLYPECTOMY     Uterine    Current Outpatient Prescriptions  Medication Sig Dispense Refill  . aspirin 81 MG EC tablet Take 81 mg by mouth daily.      Marland Kitchen atenolol (TENORMIN) 50 MG tablet Take 1 tablet (50 mg total) by mouth daily. 90 tablet 3  . azelastine (ASTELIN) 0.1 % nasal spray Place 1 spray into both nostrils 2 (two) times daily. Use in each nostril as directed 30 mL 11  . enalapril  (VASOTEC) 10 MG tablet Take 1 tablet (10 mg total) by mouth daily. 90 tablet 3  . levothyroxine (SYNTHROID, LEVOTHROID) 100 MCG tablet Take 1 tablet (100 mcg total) by mouth daily. 90 tablet 3  . omeprazole (PRILOSEC) 20 MG capsule Take 1 capsule (20 mg total) by mouth daily. 90 capsule 3  . traMADol (ULTRAM) 50 MG tablet Take 1 tablet (50 mg total) by mouth every 8 (eight) hours as needed. 60 tablet 1  . Vitamin D, Ergocalciferol, (DRISDOL) 50000 units CAPS capsule Take 1 capsule (50,000 Units total) by mouth every 7 (seven) days. 12 capsule 0  . zolpidem (AMBIEN CR) 12.5 MG CR tablet take 1 tablet by mouth at bedtime if needed 30 tablet 5   No current facility-administered medications for this visit.     Allergies  Allergen Reactions  . Codeine Nausea And Vomiting  . Oxycodone Nausea And Vomiting    Review of Systems negative except from HPI and PMH  Physical Exam BP (!) 142/70   Pulse 85   Ht 5\' 3"  (1.6 m)   Wt 123 lb 6.4 oz (56 kg)   SpO2 98%   BMI 21.86 kg/m  Well developed and nourished in no acute distress HENT normal Neck supple with JVP-flat Clear Device pocket well healed; without hematoma or erythema.  There is no tethering Regular rate and rhythm, no murmurs or gallops Abd-soft with active BS No Clubbing cyanosis edema Skin-warm and dry A & Oriented  Grossly normal sensory and motor function  ecg AV pacing  Assessment and  Plan  Complete heart block   Pacemaker  Medtronic  The patient's device was interrogated.  The information was reviewed.  Changes were made to reduce the output voltage it is there is a pulse width. This should improve longevity  Breast Cancer   Preoperative clearance    The patient's device function is normal. It is out of the range of any necessary radiation therapy. I would prefer if she needs central access to be obtained of the right side i.e. Port-A-Cath.  Presurgical risks should be a septal. We will be available as  necessary.  She is device dependent. Surgical protocols with magnet should be observed

## 2016-11-08 ENCOUNTER — Ambulatory Visit (HOSPITAL_COMMUNITY): Payer: Medicare Other

## 2016-11-08 ENCOUNTER — Encounter (HOSPITAL_BASED_OUTPATIENT_CLINIC_OR_DEPARTMENT_OTHER): Admission: RE | Payer: Self-pay | Source: Ambulatory Visit

## 2016-11-08 ENCOUNTER — Ambulatory Visit (HOSPITAL_BASED_OUTPATIENT_CLINIC_OR_DEPARTMENT_OTHER): Admission: RE | Admit: 2016-11-08 | Payer: Medicare Other | Source: Ambulatory Visit | Admitting: General Surgery

## 2016-11-08 SURGERY — MASTECTOMY WITH SENTINEL LYMPH NODE BIOPSY
Anesthesia: General | Site: Breast | Laterality: Right

## 2016-11-23 DIAGNOSIS — Z853 Personal history of malignant neoplasm of breast: Secondary | ICD-10-CM | POA: Diagnosis not present

## 2016-11-28 ENCOUNTER — Ambulatory Visit (INDEPENDENT_AMBULATORY_CARE_PROVIDER_SITE_OTHER): Payer: Medicare Other | Admitting: Internal Medicine

## 2016-11-28 VITALS — BP 134/82 | HR 84 | Resp 20 | Wt 123.0 lb

## 2016-11-28 DIAGNOSIS — E785 Hyperlipidemia, unspecified: Secondary | ICD-10-CM

## 2016-11-28 DIAGNOSIS — R7302 Impaired glucose tolerance (oral): Secondary | ICD-10-CM

## 2016-11-28 DIAGNOSIS — I10 Essential (primary) hypertension: Secondary | ICD-10-CM

## 2016-11-28 MED ORDER — ATORVASTATIN CALCIUM 10 MG PO TABS: 10.0000 mg | ORAL_TABLET | Freq: Every day | ORAL | 3 refills | Status: DC

## 2016-11-28 NOTE — Progress Notes (Signed)
Subjective:    Patient ID: Danielle [name], female    DOB: Oct 31, 1936, 81 y.o.   MRN: [id number]  HPI  Here to f/u; overall doing ok,  Pt denies chest pain, increasing sob or doe, wheezing, orthopnea, PND, increased LE swelling, palpitations, dizziness or syncope.  Pt denies new neurological symptoms such as new headache, or facial or extremity weakness or numbness.  Pt denies polydipsia, polyuria, or low sugar episode.   Pt denies new neurological symptoms such as new headache, or facial or extremity weakness or numbness.   Pt states overall good compliance with meds, mostly trying to follow appropriate diet, with wt overall stable,  but little exercise however. Is getting more active lately as has been doing better with PT per Dr Tamala Julian.  No other new history Past Medical History:  Diagnosis Date  . ALLERGIC RHINITIS 01/09/2008  . ANEMIA-NOS 01/09/2008  . ANXIETY 01/09/2008  . ASTHMATIC BRONCHITIS, ACUTE 10/20/2008  . AV BLOCK, COMPLETE 12/16/2009  . DEPRESSION 01/09/2008  . Dizziness and giddiness 02/25/2010  . DVT, HX OF 01/09/2008  . GERD 01/09/2008  . GLUCOSE INTOLERANCE 01/09/2008  . HYPERLIPIDEMIA 01/09/2008  . HYPERTENSION 01/09/2008  . HYPOTHYROIDISM 01/09/2008  . Impaired glucose tolerance 07/28/2011  . INSOMNIA-SLEEP DISORDER-UNSPEC 06/15/2009  . NEPHROLITHIASIS, HX OF 01/09/2008  . OSTEOARTHRITIS, CERVICAL SPINE 02/25/2010   Past Surgical History:  Procedure Laterality Date  . ERCP W/ SPHICTEROTOMY    . HEMORRHOID SURGERY    . PACEMAKER PLACEMENT     MedTronic EnPulse [id number]  . PERMANENT PACEMAKER GENERATOR CHANGE N/A 07/16/2012   Procedure: PERMANENT PACEMAKER GENERATOR CHANGE;  Surgeon: Deboraha Sprang, MD;  Location: [facility] CATH LAB;  Service: Cardiovascular;  Laterality: N/A;  . POLYPECTOMY     Uterine    reports that she quit smoking about 45 years ago. Her smoking use included Cigarettes. She has a 5.00 pack-year smoking history. She has never used smokeless tobacco. She reports  that she drinks alcohol. She reports that she does not use drugs. family history includes Cancer in her mother; Pulmonary embolism in her father; Sudden death in her mother. Allergies  Allergen Reactions  . Codeine Nausea And Vomiting  . Oxycodone Nausea And Vomiting   Current Outpatient Prescriptions on File Prior to Visit  Medication Sig Dispense Refill  . aspirin 81 MG EC tablet Take 81 mg by mouth daily.      Marland Kitchen atenolol (TENORMIN) 50 MG tablet Take 1 tablet (50 mg total) by mouth daily. 90 tablet 3  . azelastine (ASTELIN) 0.1 % nasal spray Place 1 spray into both nostrils 2 (two) times daily. Use in each nostril as directed 30 mL 11  . enalapril (VASOTEC) 10 MG tablet Take 1 tablet (10 mg total) by mouth daily. 90 tablet 3  . levothyroxine (SYNTHROID, LEVOTHROID) 100 MCG tablet Take 1 tablet (100 mcg total) by mouth daily. 90 tablet 3  . omeprazole (PRILOSEC) 20 MG capsule Take 1 capsule (20 mg total) by mouth daily. 90 capsule 3  . traMADol (ULTRAM) 50 MG tablet Take 1 tablet (50 mg total) by mouth every 8 (eight) hours as needed. 60 tablet 1  . Vitamin D, Ergocalciferol, (DRISDOL) 50000 units CAPS capsule Take 1 capsule (50,000 Units total) by mouth every 7 (seven) days. 12 capsule 0  . zolpidem (AMBIEN CR) 12.5 MG CR tablet take 1 tablet by mouth at bedtime if needed 30 tablet 5   No current facility-administered medications on file prior to visit.  Review of Systems  Constitutional: Negative for unusual diaphoresis or night sweats HENT: Negative for ear swelling or discharge Eyes: Negative for worsening visual haziness  Respiratory: Negative for choking and stridor.   Gastrointestinal: Negative for distension or worsening eructation Genitourinary: Negative for retention or change in urine volume.  Musculoskeletal: Negative for other MSK pain or swelling Skin: Negative for color change and worsening wound Neurological: Negative for tremors and numbness other than noted    Psychiatric/Behavioral: Negative for decreased concentration or agitation other than above   All other system neg per pt    Objective:   Physical Exam BP 134/82   Pulse 84   Resp 20   Wt 123 lb (55.8 kg)   SpO2 98%   BMI 21.79 kg/m  VS noted,  Constitutional: Pt appears in no apparent distress HENT: Head: NCAT.  Right Ear: External ear normal.  Left Ear: External ear normal.  Eyes: . Pupils are equal, round, and reactive to light. Conjunctivae and EOM are normal Neck: Normal range of motion. Neck supple.  Cardiovascular: Normal rate and regular rhythm.   Pulmonary/Chest: Effort normal and breath sounds without rales or wheezing.  Abd:  Soft, NT, ND, + BS Neurological: Pt is alert. Not confused , motor grossly intact Skin: Skin is warm. No rash, no LE edema Psychiatric: Pt behavior is normal. No agitation.  No other new exam ifndings    Assessment & Plan:

## 2016-11-28 NOTE — Patient Instructions (Signed)
/  Please take all new medication as prescribed - the low dose medication for cholesterol  Please continue all other medications as before, and refills have been done if requested.  Please have the pharmacy call with any other refills you may need.  Please continue your efforts at being more active, low cholesterol diet, and weight control.  Please keep your appointments with your specialists as you may have planned  Please return in 6 months, or sooner if needed

## 2016-11-28 NOTE — Progress Notes (Signed)
Pre visit review using our clinic review tool, if applicable. No additional management support is needed unless otherwise documented below in the visit note. 

## 2016-11-30 ENCOUNTER — Telehealth: Payer: Self-pay | Admitting: *Deleted

## 2016-11-30 NOTE — Telephone Encounter (Signed)
Return pt call concerning new pt appt. Left vm for pt to return call. Contact information provided.

## 2016-12-01 ENCOUNTER — Telehealth: Payer: Self-pay | Admitting: Hematology and Oncology

## 2016-12-01 ENCOUNTER — Encounter: Payer: Self-pay | Admitting: Hematology and Oncology

## 2016-12-01 NOTE — Telephone Encounter (Signed)
Pt scheduled to see Gudena on 1/24 at 345pm.

## 2016-12-02 NOTE — Assessment & Plan Note (Signed)
stable overall by history and exam, recent data reviewed with pt, and pt to continue medical treatment as before,  to f/u any worsening symptoms or concerns Lab Results  Component Value Date   HGBA1C 5.4 05/11/2016    

## 2016-12-02 NOTE — Assessment & Plan Note (Signed)
Mild uncontrolled, for lipitor 10 qd,  to f/u any worsening symptoms or concerns 

## 2016-12-02 NOTE — Assessment & Plan Note (Signed)
stable overall by history and exam, recent data reviewed with pt, and pt to continue medical treatment as before,  to f/u any worsening symptoms or concerns BP Readings from Last 3 Encounters:  11/28/16 134/82  11/02/16 (!) 142/70  09/05/16 122/78

## 2016-12-13 ENCOUNTER — Encounter: Payer: Self-pay | Admitting: *Deleted

## 2016-12-13 ENCOUNTER — Ambulatory Visit (HOSPITAL_BASED_OUTPATIENT_CLINIC_OR_DEPARTMENT_OTHER): Payer: Medicare Other | Admitting: Hematology and Oncology

## 2016-12-13 ENCOUNTER — Encounter: Payer: Self-pay | Admitting: Hematology and Oncology

## 2016-12-13 DIAGNOSIS — Z171 Estrogen receptor negative status [ER-]: Secondary | ICD-10-CM

## 2016-12-13 DIAGNOSIS — Z803 Family history of malignant neoplasm of breast: Secondary | ICD-10-CM | POA: Diagnosis not present

## 2016-12-13 DIAGNOSIS — Z87891 Personal history of nicotine dependence: Secondary | ICD-10-CM | POA: Diagnosis not present

## 2016-12-13 DIAGNOSIS — C50211 Malignant neoplasm of upper-inner quadrant of right female breast: Secondary | ICD-10-CM | POA: Diagnosis not present

## 2016-12-13 NOTE — Progress Notes (Signed)
[name] CONSULT NOTE  Patient Care Team: Danielle Borg, MD as PCP - General  CHIEF COMPLAINTS/PURPOSE OF CONSULTATION:  Newly diagnosed breast cancer  HISTORY OF PRESENTING ILLNESS:  Danielle [name] 81 y.o. female is here because of recent diagnosis of right breast triple negative cancer. Patient had a palpable abnormality in the right breast which was evaluated by mammogram on 09/19/2016. This revealed a 1.5 cm lesion. Axillary ultrasound was negative. Biopsy of this mass was performed on 09/20/2016 and attributed triple negative invasive ductal carcinoma with high-grade DCIS with comedonecrosis and calcifications. She was initially seen by surgery but then she decided to change physicians. This along with holidays and viral illness postponed her treatment. 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 She lives in her home with her husband. She also has a daughter who lives locally. She has not informed any of them about her breast cancer diagnosis. She intends to tell them only on the day of surgery.  I reviewed her records extensively and collaborated the history with the patient.  SUMMARY OF ONCOLOGIC HISTORY:   Breast cancer of upper-inner quadrant of right female breast ([place])   09/19/2016 Mammogram    Palpable abnormality 12:30 position right breast mass with satellite nodule. Mass measures 1.5 x 1 x 1.2 cm and satellite nodule 0.7 cm, axilla ultrasound negative. T1c N0 stage IA      09/20/2016 Initial Diagnosis    Right breast biopsy: 12:30 position: IDC grade 3, ER 0%, PR 0%, HER-2 negative ratio 1.24. High-grade DCIS with comedonecrosis and calcifications upper outer quadrant      MEDICAL HISTORY:  Past Medical History:  Diagnosis Date  . ALLERGIC RHINITIS 01/09/2008  . ANEMIA-NOS 01/09/2008  . ANXIETY 01/09/2008  . ASTHMATIC BRONCHITIS, ACUTE 10/20/2008  . AV BLOCK, COMPLETE 12/16/2009  . DEPRESSION 01/09/2008  . Dizziness and giddiness 02/25/2010  . DVT, HX OF  01/09/2008  . GERD 01/09/2008  . GLUCOSE INTOLERANCE 01/09/2008  . HYPERLIPIDEMIA 01/09/2008  . HYPERTENSION 01/09/2008  . HYPOTHYROIDISM 01/09/2008  . Impaired glucose tolerance 07/28/2011  . INSOMNIA-SLEEP DISORDER-UNSPEC 06/15/2009  . NEPHROLITHIASIS, HX OF 01/09/2008  . OSTEOARTHRITIS, CERVICAL SPINE 02/25/2010    SURGICAL HISTORY: Past Surgical History:  Procedure Laterality Date  . ERCP W/ SPHICTEROTOMY    . HEMORRHOID SURGERY    . PACEMAKER PLACEMENT     MedTronic EnPulse [id number]  . PERMANENT PACEMAKER GENERATOR CHANGE N/A 07/16/2012   Procedure: PERMANENT PACEMAKER GENERATOR CHANGE;  Surgeon: Deboraha Sprang, MD;  Location: [facility] CATH LAB;  Service: Cardiovascular;  Laterality: N/A;  . POLYPECTOMY     Uterine    SOCIAL HISTORY: Social History   Social History  . Marital status: Married    Spouse name: N/A  . Number of children: N/A  . Years of education: N/A   Occupational History  . Not on file.   Social History Main Topics  . Smoking status: Former Smoker    Packs/day: 0.20    Years: 25.00    Types: Cigarettes    Quit date: 11/21/1971  . Smokeless tobacco: Never Used  . Alcohol use Yes  . Drug use: No  . Sexual activity: Not on file   Other Topics Concern  . Not on file   Social History Narrative  . No narrative on file    FAMILY HISTORY: Family History  Problem Relation Age of Onset  . Sudden death Mother   . Cancer Mother     Breast  . Pulmonary embolism  Father     Possible    ALLERGIES:  is allergic to codeine and oxycodone.  MEDICATIONS:  Current Outpatient Prescriptions  Medication Sig Dispense Refill  . aspirin 81 MG EC tablet Take 81 mg by mouth daily.      Marland Kitchen atenolol (TENORMIN) 50 MG tablet Take 1 tablet (50 mg total) by mouth daily. 90 tablet 3  . atorvastatin (LIPITOR) 10 MG tablet Take 1 tablet (10 mg total) by mouth daily. 90 tablet 3  . azelastine (ASTELIN) 0.1 % nasal spray Place 1 spray into both nostrils 2 (two) times daily. Use in  each nostril as directed 30 mL 11  . enalapril (VASOTEC) 10 MG tablet Take 1 tablet (10 mg total) by mouth daily. 90 tablet 3  . levothyroxine (SYNTHROID, LEVOTHROID) 100 MCG tablet Take 1 tablet (100 mcg total) by mouth daily. 90 tablet 3  . omeprazole (PRILOSEC) 20 MG capsule Take 1 capsule (20 mg total) by mouth daily. 90 capsule 3  . traMADol (ULTRAM) 50 MG tablet Take 1 tablet (50 mg total) by mouth every 8 (eight) hours as needed. 60 tablet 1  . Vitamin D, Ergocalciferol, (DRISDOL) 50000 units CAPS capsule Take 1 capsule (50,000 Units total) by mouth every 7 (seven) days. 12 capsule 0  . zolpidem (AMBIEN CR) 12.5 MG CR tablet take 1 tablet by mouth at bedtime if needed 30 tablet 5   No current facility-administered medications for this visit.     REVIEW OF SYSTEMS:   Constitutional: Denies fevers, chills or abnormal night sweats Eyes: Denies blurriness of vision, double vision or watery eyes Ears, nose, mouth, throat, and face: Denies mucositis or sore throat Respiratory: Denies cough, dyspnea or wheezes Cardiovascular: Denies palpitation, chest discomfort or lower extremity swelling Gastrointestinal:  Denies nausea, heartburn or change in bowel habits Skin: Denies abnormal skin rashes Lymphatics: Denies new lymphadenopathy or easy bruising Neurological:Denies numbness, tingling or new weaknesses Behavioral/Psych: Mood is stable, no new changes  Breast:Palpable lump at the right breast All other systems were reviewed with the patient and are negative.  PHYSICAL EXAMINATION: ECOG PERFORMANCE STATUS: 0 - Asymptomatic  Vitals:   12/13/16 1527  BP: 131/76  Pulse: 80  Resp: 18  Temp: 98 F (36.7 C)   Filed Weights   12/13/16 1527  Weight: 122 lb 3.2 oz (55.4 kg)    GENERAL:alert, no distress and comfortable SKIN: skin color, texture, turgor are normal, no rashes or significant lesions EYES: normal, conjunctiva are pink and non-injected, sclera clear OROPHARYNX:no exudate,  no erythema and lips, buccal mucosa, and tongue normal  NECK: supple, thyroid normal size, non-tender, without nodularity LYMPH:  no palpable lymphadenopathy in the cervical, axillary or inguinal LUNGS: clear to auscultation and percussion with normal breathing effort HEART: regular rate & rhythm and no murmurs and no lower extremity edema ABDOMEN:abdomen soft, non-tender and normal bowel sounds Musculoskeletal:no cyanosis of digits and no clubbing  PSYCH: alert & oriented x 3 with fluent speech NEURO: no focal motor/sensory deficits BREAST:Right breast palpable lump upper quadrant of the right breast (exam performed in the presence of a chaperone)   LABORATORY DATA:  I have reviewed the data as listed Lab Results  Component Value Date   WBC 6.2 05/11/2016   HGB 12.3 05/11/2016   HCT 36.6 05/11/2016   MCV 93.4 05/11/2016   PLT 196.0 05/11/2016   Lab Results  Component Value Date   NA 140 05/11/2016   K 4.6 05/11/2016   CL 106 05/11/2016   CO2  28 05/11/2016    RADIOGRAPHIC STUDIES: I have personally reviewed the radiological reports and agreed with the findings in the report.  ASSESSMENT AND PLAN:  Breast cancer of upper-inner quadrant of right female breast ([place]) Palpable abnormality 12:30 position right breast mass with satellite nodule. Mass measures 1.5 x 1 x 1.2 cm and satellite nodule 0.7 cm, axilla ultrasound negative. T1c N0 stage IA  09/20/2016 Right breast biopsy: 12:30 position: IDC grade 3, ER 0%, PR 0%, HER-2 negative ratio 1.24. High-grade DCIS with comedonecrosis and calcifications upper outer quadrant (Delay in treatment because of several reasons including changing physicians as well as personal illness)  Pathology and radiology counseling: Discussed with the patient, the details of pathology including the type of breast cancer,the clinical staging, the significance of ER, PR and HER-2/neu receptors and the implications for treatment. After reviewing the  pathology in detail, we proceeded to discuss the different treatment options between surgery, and, chemotherapy.  Recommendation: 1. Mastectomy 2. followed by adjuvant chemotherapy with Taxotere and Cytoxan every 3 weeks 4 cycles  Chemotherapy Counseling: I discussed the risks and benefits of chemotherapy including the risks of nausea/ vomiting, risk of infection from low WBC count, fatigue due to chemo or anemia, bruising or bleeding due to low platelets, mouth sores, loss/ change in taste and decreased appetite. Liver and kidney function will be monitored through out chemotherapy as abnormalities in liver and kidney function may be a side effect of treatment. Risk of permanent bone marrow dysfunction and leukemia due to chemo were also discussed.  Patient does not wish to have a port We will plan to do chemotherapy through her peripheral vein. Patient fully understands that there is a risk of extravasation and skin damage related to chemotherapy when we do it with a peripheral rim. She is willing to accept these risks.  Return to clinic after surgery All questions were answered. The patient knows to call the clinic with any problems, questions or concerns.    Rulon Eisenmenger, MD 12/13/16

## 2016-12-13 NOTE — Assessment & Plan Note (Signed)
Palpable abnormality 12:30 position right breast mass with satellite nodule. Mass measures 1.5 x 1 x 1.2 cm and satellite nodule 0.7 cm, axilla ultrasound negative. T1c N0 stage IA  09/20/2016 Right breast biopsy: 12:30 position: IDC grade 3, ER 0%, PR 0%, HER-2 negative ratio 1.24. High-grade DCIS with comedonecrosis and calcifications upper outer quadrant (Delay in treatment because of several reasons including changing physicians as well as personal illness)  Pathology and radiology counseling: Discussed with the patient, the details of pathology including the type of breast cancer,the clinical staging, the significance of ER, PR and HER-2/neu receptors and the implications for treatment. After reviewing the pathology in detail, we proceeded to discuss the different treatment options between surgery, and, chemotherapy.  Recommendation: 1. Mastectomy 2. followed by adjuvant chemotherapy with Taxotere and Cytoxan every 3 weeks 4 cycles  Chemotherapy Counseling: I discussed the risks and benefits of chemotherapy including the risks of nausea/ vomiting, risk of infection from low WBC count, fatigue due to chemo or anemia, bruising or bleeding due to low platelets, mouth sores, loss/ change in taste and decreased appetite. Liver and kidney function will be monitored through out chemotherapy as abnormalities in liver and kidney function may be a side effect of treatment. Risk of permanent bone marrow dysfunction and leukemia due to chemo were also discussed.  Patient does not wish to have a port We will plan to do chemotherapy through her peripheral vein. Patient fully understands that there is a risk of extravasation and skin damage related to chemotherapy when we do it with a peripheral rim. She is willing to accept these risks.

## 2016-12-15 ENCOUNTER — Ambulatory Visit (INDEPENDENT_AMBULATORY_CARE_PROVIDER_SITE_OTHER): Payer: Medicare Other

## 2016-12-15 DIAGNOSIS — Z23 Encounter for immunization: Secondary | ICD-10-CM | POA: Diagnosis not present

## 2016-12-18 ENCOUNTER — Other Ambulatory Visit: Payer: Self-pay | Admitting: General Surgery

## 2016-12-22 ENCOUNTER — Telehealth: Payer: Self-pay | Admitting: Hematology and Oncology

## 2016-12-22 NOTE — Telephone Encounter (Signed)
sw pt to confirm 2/27 appt at 215 pm per LOS

## 2016-12-28 ENCOUNTER — Telehealth: Payer: Self-pay | Admitting: *Deleted

## 2016-12-28 NOTE — Telephone Encounter (Signed)
"  I need to speak with a nurse for radiation to see if I'm a candidate for radiation.  My surgeon Dr. Barry Dienes gave me a booklet with this number to call."  Medical oncologist will discuss treatment plan with next F/U visit making this referral per treatment plan protocol.  No further questions.

## 2017-01-08 ENCOUNTER — Encounter (HOSPITAL_COMMUNITY)
Admission: RE | Admit: 2017-01-08 | Discharge: 2017-01-08 | Disposition: A | Discharge status: Home / Self Care | Payer: Medicare Other | Source: Ambulatory Visit | Attending: General Surgery | Admitting: General Surgery

## 2017-01-08 ENCOUNTER — Encounter (HOSPITAL_COMMUNITY): Payer: Self-pay

## 2017-01-08 DIAGNOSIS — I10 Essential (primary) hypertension: Secondary | ICD-10-CM | POA: Diagnosis not present

## 2017-01-08 DIAGNOSIS — C50911 Malignant neoplasm of unspecified site of right female breast: Secondary | ICD-10-CM | POA: Insufficient documentation

## 2017-01-08 DIAGNOSIS — Z01812 Encounter for preprocedural laboratory examination: Secondary | ICD-10-CM | POA: Insufficient documentation

## 2017-01-08 HISTORY — DX: Presence of cardiac pacemaker: Z95.0

## 2017-01-08 HISTORY — DX: Personal history of urinary calculi: Z87.442

## 2017-01-08 LAB — URINALYSIS, ROUTINE W REFLEX MICROSCOPIC
BILIRUBIN URINE: NEGATIVE
Glucose, UA: NEGATIVE mg/dL
Hgb urine dipstick: NEGATIVE
Ketones, ur: NEGATIVE mg/dL
NITRITE: NEGATIVE
PH: 5 (ref 5.0–8.0)
Protein, ur: NEGATIVE mg/dL
SPECIFIC GRAVITY, URINE: 1.013 (ref 1.005–1.030)

## 2017-01-08 LAB — COMPREHENSIVE METABOLIC PANEL
ALK PHOS: 77 U/L (ref 38–126)
ALT: 22 U/L (ref 14–54)
AST: 26 U/L (ref 15–41)
Albumin: 4.4 g/dL (ref 3.5–5.0)
Anion gap: 11 (ref 5–15)
BUN: 23 mg/dL — AB (ref 6–20)
CALCIUM: 10.8 mg/dL — AB (ref 8.9–10.3)
CO2: 24 mmol/L (ref 22–32)
Chloride: 103 mmol/L (ref 101–111)
Creatinine, Ser: 1.06 mg/dL — ABNORMAL HIGH (ref 0.44–1.00)
GFR, EST AFRICAN AMERICAN: 56 mL/min — AB (ref >60)
GFR, EST NON AFRICAN AMERICAN: 49 mL/min — AB (ref >60)
Glucose, Bld: 95 mg/dL (ref 65–99)
Potassium: 4.2 mmol/L (ref 3.5–5.1)
Sodium: 138 mmol/L (ref 135–145)
Total Bilirubin: 1 mg/dL (ref 0.3–1.2)
Total Protein: 6.6 g/dL (ref 6.5–8.1)

## 2017-01-08 LAB — CBC WITH DIFFERENTIAL/PLATELET
BASOS PCT: 0 %
Basophils Absolute: 0 10*3/uL (ref 0.0–0.1)
EOS ABS: 0.2 10*3/uL (ref 0.0–0.7)
EOS PCT: 3 %
HEMATOCRIT: 36.7 % (ref 36.0–46.0)
Hemoglobin: 12 g/dL (ref 12.0–15.0)
Lymphocytes Relative: 24 %
Lymphs Abs: 1.5 10*3/uL (ref 0.7–4.0)
MCH: 32.3 pg (ref 26.0–34.0)
MCHC: 32.7 g/dL (ref 30.0–36.0)
MCV: 98.7 fL (ref 78.0–100.0)
MONO ABS: 0.7 10*3/uL (ref 0.1–1.0)
MONOS PCT: 11 %
Neutro Abs: 3.8 10*3/uL (ref 1.7–7.7)
Neutrophils Relative %: 62 %
PLATELETS: 186 10*3/uL (ref 150–400)
RBC: 3.72 MIL/uL — ABNORMAL LOW (ref 3.87–5.11)
RDW: 12.8 % (ref 11.5–15.5)
WBC: 6.2 10*3/uL (ref 4.0–10.5)

## 2017-01-08 MED ORDER — CHLORHEXIDINE GLUCONATE CLOTH 2 % EX PADS: 6.0000 | MEDICATED_PAD | Freq: Once | CUTANEOUS | Status: DC

## 2017-01-08 NOTE — Pre-Procedure Instructions (Signed)
    Danielle [name]  01/08/2017      [id number], [place] [address] Phone: [phone] Fax: [phone]    Your procedure is scheduled on 01/11/17.  Report to [facility] Admitting at 930 A.M.  Call this number if you have problems the morning of surgery:  [phone]   Remember:  Do not eat food or drink liquids after midnight.  Take these medicines the morning of surgery with A SIP OF WATER --tylenol,atenolol,synthroid   Do not wear jewelry, make-up or nail polish.  Do not wear lotions, powders, or perfumes, or deoderant.  Do not shave 48 hours prior to surgery.  Men may shave face and neck.  Do not bring valuables to the hospital.  [facility] is not responsible for any belongings or valuables.  Contacts, dentures or bridgework may not be worn into surgery.  Leave your suitcase in the car.  After surgery it may be brought to your room.  For patients admitted to the hospital, discharge time will be determined by your treatment team.  Patients discharged the day of surgery will not be allowed to drive home.   Name and phone number of your driver:    Special instructions:  Do not take any aspirin,anti-inflammatories,vitamins,or herbal supplements 5-7 days prior to surgery.  Please read over the following fact sheets that you were given.    Drink boost 2hrs prior to arrival

## 2017-01-09 ENCOUNTER — Encounter (HOSPITAL_COMMUNITY): Payer: Medicare Other

## 2017-01-10 ENCOUNTER — Telehealth: Payer: Self-pay | Admitting: Internal Medicine

## 2017-01-10 NOTE — Telephone Encounter (Signed)
Spoke with patient and advised her that Dr. Olin Pia note from last office visit on 12/14 states: Presurgical risks should be a septal. We will be available as necessary. She is device dependent. Surgical protocols with magnet should be observed  I advised her that the magnets are used for her safety and that she will be closely monitored by a team of health care providers. She was very appreciative of the call and asked me to thank Dr. Caryl Comes for her.

## 2017-01-10 NOTE — Telephone Encounter (Signed)
New message    Per pt give Dr Caryl Comes msg :  She is having surgery 01/11/17 and they are going to put a magnet on her pacemaker, but she is really worried about having it done.  Dr Barry Dienes is doing the surgery, and she is 100% blocked and she is worried .

## 2017-01-11 ENCOUNTER — Ambulatory Visit (HOSPITAL_COMMUNITY): Payer: Medicare Other | Admitting: Certified Registered Nurse Anesthetist

## 2017-01-11 ENCOUNTER — Ambulatory Visit (HOSPITAL_COMMUNITY)
Admission: RE | Admit: 2017-01-11 | Discharge: 2017-01-11 | Disposition: A | Discharge status: Home / Self Care | Payer: Medicare Other | Source: Ambulatory Visit | Attending: General Surgery | Admitting: General Surgery

## 2017-01-11 ENCOUNTER — Encounter (HOSPITAL_COMMUNITY): Payer: Self-pay | Admitting: *Deleted

## 2017-01-11 ENCOUNTER — Encounter (HOSPITAL_COMMUNITY): Payer: Medicare Other

## 2017-01-11 ENCOUNTER — Encounter (HOSPITAL_COMMUNITY)
Admission: RE | Disposition: A | Discharge status: Home / Self Care | Payer: Self-pay | Source: Ambulatory Visit | Attending: General Surgery

## 2017-01-11 ENCOUNTER — Ambulatory Visit (HOSPITAL_COMMUNITY)
Admission: RE | Admit: 2017-01-11 | Discharge: 2017-01-12 | Disposition: A | Discharge status: Home / Self Care | Payer: Medicare Other | Source: Ambulatory Visit | Attending: General Surgery | Admitting: General Surgery

## 2017-01-11 DIAGNOSIS — Z171 Estrogen receptor negative status [ER-]: Principal | ICD-10-CM

## 2017-01-11 DIAGNOSIS — E039 Hypothyroidism, unspecified: Secondary | ICD-10-CM | POA: Diagnosis not present

## 2017-01-11 DIAGNOSIS — F419 Anxiety disorder, unspecified: Secondary | ICD-10-CM | POA: Insufficient documentation

## 2017-01-11 DIAGNOSIS — M1990 Unspecified osteoarthritis, unspecified site: Secondary | ICD-10-CM | POA: Diagnosis not present

## 2017-01-11 DIAGNOSIS — Z9011 Acquired absence of right breast and nipple: Secondary | ICD-10-CM

## 2017-01-11 DIAGNOSIS — Z885 Allergy status to narcotic agent status: Secondary | ICD-10-CM | POA: Insufficient documentation

## 2017-01-11 DIAGNOSIS — C50211 Malignant neoplasm of upper-inner quadrant of right female breast: Secondary | ICD-10-CM | POA: Insufficient documentation

## 2017-01-11 DIAGNOSIS — I10 Essential (primary) hypertension: Secondary | ICD-10-CM | POA: Diagnosis not present

## 2017-01-11 DIAGNOSIS — Z950 Presence of cardiac pacemaker: Secondary | ICD-10-CM | POA: Insufficient documentation

## 2017-01-11 DIAGNOSIS — C50911 Malignant neoplasm of unspecified site of right female breast: Secondary | ICD-10-CM | POA: Diagnosis not present

## 2017-01-11 DIAGNOSIS — G8918 Other acute postprocedural pain: Secondary | ICD-10-CM | POA: Diagnosis not present

## 2017-01-11 DIAGNOSIS — Z87891 Personal history of nicotine dependence: Secondary | ICD-10-CM | POA: Insufficient documentation

## 2017-01-11 DIAGNOSIS — Z803 Family history of malignant neoplasm of breast: Secondary | ICD-10-CM | POA: Diagnosis not present

## 2017-01-11 DIAGNOSIS — F329 Major depressive disorder, single episode, unspecified: Secondary | ICD-10-CM | POA: Diagnosis not present

## 2017-01-11 DIAGNOSIS — K219 Gastro-esophageal reflux disease without esophagitis: Secondary | ICD-10-CM | POA: Insufficient documentation

## 2017-01-11 DIAGNOSIS — Z7982 Long term (current) use of aspirin: Secondary | ICD-10-CM | POA: Insufficient documentation

## 2017-01-11 HISTORY — PX: MASTECTOMY: SHX3

## 2017-01-11 HISTORY — DX: Acquired absence of right breast and nipple: Z90.11

## 2017-01-11 HISTORY — PX: MASTECTOMY W/ SENTINEL NODE BIOPSY: SHX2001

## 2017-01-11 SURGERY — MASTECTOMY WITH SENTINEL LYMPH NODE BIOPSY
Anesthesia: Regional | Site: Breast | Laterality: Right

## 2017-01-11 MED ORDER — POLYETHYLENE GLYCOL 3350 17 G PO PACK: 17.0000 g | PACK | Freq: Every day | ORAL | Status: DC | PRN

## 2017-01-11 MED ORDER — GABAPENTIN 300 MG PO CAPS
ORAL_CAPSULE | ORAL | Status: AC
  Administered 2017-01-11: 300 mg via ORAL
  Filled 2017-01-11: qty 1

## 2017-01-11 MED ORDER — 0.9 % SODIUM CHLORIDE (POUR BTL) OPTIME
TOPICAL | Status: DC | PRN
Start: 2017-01-11 — End: 2017-01-11
  Administered 2017-01-11: 1000 mL

## 2017-01-11 MED ORDER — SIMETHICONE 80 MG PO CHEW: 40.0000 mg | CHEWABLE_TABLET | Freq: Four times a day (QID) | ORAL | Status: DC | PRN

## 2017-01-11 MED ORDER — ZOLPIDEM TARTRATE 5 MG PO TABS
5.0000 mg | ORAL_TABLET | Freq: Every evening | ORAL | Status: DC | PRN
  Administered 2017-01-11: 5 mg via ORAL
  Filled 2017-01-11: qty 1

## 2017-01-11 MED ORDER — BUPIVACAINE-EPINEPHRINE (PF) 0.5% -1:200000 IJ SOLN
INTRAMUSCULAR | Status: AC
  Filled 2017-01-11: qty 30

## 2017-01-11 MED ORDER — DIPHENHYDRAMINE HCL 12.5 MG/5ML PO ELIX: 12.5000 mg | ORAL_SOLUTION | Freq: Four times a day (QID) | ORAL | Status: DC | PRN

## 2017-01-11 MED ORDER — METHOCARBAMOL 500 MG PO TABS: 500.0000 mg | ORAL_TABLET | Freq: Four times a day (QID) | ORAL | Status: DC | PRN

## 2017-01-11 MED ORDER — ROCURONIUM BROMIDE 100 MG/10ML IV SOLN
INTRAVENOUS | Status: DC | PRN
  Administered 2017-01-11: 30 mg via INTRAVENOUS

## 2017-01-11 MED ORDER — MIDAZOLAM HCL 2 MG/2ML IJ SOLN
INTRAMUSCULAR | Status: AC
  Filled 2017-01-11: qty 2

## 2017-01-11 MED ORDER — SUGAMMADEX SODIUM 200 MG/2ML IV SOLN
INTRAVENOUS | Status: DC | PRN
  Administered 2017-01-11: 100 mg via INTRAVENOUS

## 2017-01-11 MED ORDER — LIDOCAINE HCL (CARDIAC) 20 MG/ML IV SOLN
INTRAVENOUS | Status: DC | PRN
  Administered 2017-01-11: 40 mg via INTRAVENOUS

## 2017-01-11 MED ORDER — LACTATED RINGERS IV SOLN
INTRAVENOUS | Status: DC
  Administered 2017-01-11 (×2): via INTRAVENOUS

## 2017-01-11 MED ORDER — PROPOFOL 10 MG/ML IV BOLUS
INTRAVENOUS | Status: DC | PRN
  Administered 2017-01-11: 100 mg via INTRAVENOUS
  Administered 2017-01-11 (×2): 20 mg via INTRAVENOUS

## 2017-01-11 MED ORDER — TECHNETIUM TC 99M SULFUR COLLOID FILTERED
1.0000 | Freq: Once | INTRAVENOUS | Status: AC | PRN
  Administered 2017-01-11: 1 via INTRADERMAL

## 2017-01-11 MED ORDER — ONDANSETRON 4 MG PO TBDP: 4.0000 mg | ORAL_TABLET | Freq: Four times a day (QID) | ORAL | Status: DC | PRN

## 2017-01-11 MED ORDER — DOCUSATE SODIUM 100 MG PO CAPS
100.0000 mg | ORAL_CAPSULE | Freq: Two times a day (BID) | ORAL | Status: DC
  Administered 2017-01-11 – 2017-01-12 (×2): 100 mg via ORAL
  Filled 2017-01-11 (×2): qty 1

## 2017-01-11 MED ORDER — MIDAZOLAM HCL 5 MG/5ML IJ SOLN
INTRAMUSCULAR | Status: DC | PRN
  Administered 2017-01-11: 1 mg via INTRAVENOUS

## 2017-01-11 MED ORDER — CEFAZOLIN SODIUM-DEXTROSE 2-4 GM/100ML-% IV SOLN
INTRAVENOUS | Status: AC
  Filled 2017-01-11: qty 100

## 2017-01-11 MED ORDER — PANTOPRAZOLE SODIUM 40 MG PO TBEC
40.0000 mg | DELAYED_RELEASE_TABLET | Freq: Every day | ORAL | Status: DC
  Administered 2017-01-12: 40 mg via ORAL
  Filled 2017-01-11: qty 1

## 2017-01-11 MED ORDER — FENTANYL CITRATE (PF) 100 MCG/2ML IJ SOLN
INTRAMUSCULAR | Status: AC
  Filled 2017-01-11: qty 2

## 2017-01-11 MED ORDER — ACETAMINOPHEN 325 MG PO TABS: 650.0000 mg | ORAL_TABLET | Freq: Four times a day (QID) | ORAL | Status: DC | PRN

## 2017-01-11 MED ORDER — FENTANYL CITRATE (PF) 100 MCG/2ML IJ SOLN
25.0000 ug | INTRAMUSCULAR | Status: DC | PRN
  Administered 2017-01-11: 50 ug via INTRAVENOUS

## 2017-01-11 MED ORDER — LEVOTHYROXINE SODIUM 100 MCG PO TABS
100.0000 ug | ORAL_TABLET | Freq: Every day | ORAL | Status: DC
  Administered 2017-01-12: 100 ug via ORAL
  Filled 2017-01-11: qty 1

## 2017-01-11 MED ORDER — FENTANYL CITRATE (PF) 100 MCG/2ML IJ SOLN
INTRAMUSCULAR | Status: DC | PRN
  Administered 2017-01-11 (×3): 50 ug via INTRAVENOUS

## 2017-01-11 MED ORDER — CEFAZOLIN SODIUM-DEXTROSE 2-4 GM/100ML-% IV SOLN
2.0000 g | INTRAVENOUS | Status: AC
  Administered 2017-01-11: 2 g via INTRAVENOUS

## 2017-01-11 MED ORDER — GABAPENTIN 300 MG PO CAPS
300.0000 mg | ORAL_CAPSULE | ORAL | Status: AC
  Administered 2017-01-11: 300 mg via ORAL

## 2017-01-11 MED ORDER — DIPHENHYDRAMINE HCL 50 MG/ML IJ SOLN: 12.5000 mg | Freq: Four times a day (QID) | INTRAMUSCULAR | Status: DC | PRN

## 2017-01-11 MED ORDER — CEFAZOLIN SODIUM-DEXTROSE 2-4 GM/100ML-% IV SOLN
2.0000 g | Freq: Three times a day (TID) | INTRAVENOUS | Status: AC
  Administered 2017-01-11: 2 g via INTRAVENOUS
  Filled 2017-01-11: qty 100

## 2017-01-11 MED ORDER — ACETAMINOPHEN 500 MG PO TABS
1000.0000 mg | ORAL_TABLET | ORAL | Status: AC
  Administered 2017-01-11: 1000 mg via ORAL

## 2017-01-11 MED ORDER — ENALAPRIL MALEATE 10 MG PO TABS
10.0000 mg | ORAL_TABLET | Freq: Every day | ORAL | Status: DC
  Administered 2017-01-12: 10 mg via ORAL
  Filled 2017-01-11: qty 1

## 2017-01-11 MED ORDER — ONDANSETRON HCL 4 MG/2ML IJ SOLN
INTRAMUSCULAR | Status: AC
  Filled 2017-01-11: qty 4

## 2017-01-11 MED ORDER — ATORVASTATIN CALCIUM 10 MG PO TABS
10.0000 mg | ORAL_TABLET | Freq: Every day | ORAL | Status: DC
  Administered 2017-01-11 – 2017-01-12 (×2): 10 mg via ORAL
  Filled 2017-01-11 (×2): qty 1

## 2017-01-11 MED ORDER — ACETAMINOPHEN 500 MG PO TABS
ORAL_TABLET | ORAL | Status: AC
  Administered 2017-01-11: 1000 mg via ORAL
  Filled 2017-01-11: qty 2

## 2017-01-11 MED ORDER — FENTANYL CITRATE (PF) 100 MCG/2ML IJ SOLN: 12.5000 ug | INTRAMUSCULAR | Status: DC | PRN

## 2017-01-11 MED ORDER — KCL IN DEXTROSE-NACL 20-5-0.45 MEQ/L-%-% IV SOLN
INTRAVENOUS | Status: AC
  Administered 2017-01-11: 17:00:00 via INTRAVENOUS
  Filled 2017-01-11: qty 1000

## 2017-01-11 MED ORDER — DEXAMETHASONE SODIUM PHOSPHATE 10 MG/ML IJ SOLN
INTRAMUSCULAR | Status: DC | PRN
  Administered 2017-01-11: 10 mg via INTRAVENOUS

## 2017-01-11 MED ORDER — VITAMIN D3 25 MCG (1000 UNIT) PO TABS
2000.0000 [IU] | ORAL_TABLET | Freq: Every day | ORAL | Status: DC
  Administered 2017-01-12: 2000 [IU] via ORAL
  Filled 2017-01-11 (×2): qty 2

## 2017-01-11 MED ORDER — LIDOCAINE HCL (PF) 1 % IJ SOLN
INTRAMUSCULAR | Status: AC
  Filled 2017-01-11: qty 30

## 2017-01-11 MED ORDER — MENTHOL 3 MG MT LOZG
1.0000 | LOZENGE | OROMUCOSAL | Status: DC | PRN
  Administered 2017-01-11 – 2017-01-12 (×3): 3 mg via ORAL
  Filled 2017-01-11 (×3): qty 9

## 2017-01-11 MED ORDER — ATENOLOL 50 MG PO TABS
50.0000 mg | ORAL_TABLET | Freq: Every day | ORAL | Status: DC
  Administered 2017-01-12: 50 mg via ORAL
  Filled 2017-01-11: qty 1

## 2017-01-11 MED ORDER — TRAMADOL HCL 50 MG PO TABS: 50.0000 mg | ORAL_TABLET | Freq: Three times a day (TID) | ORAL | Status: DC | PRN

## 2017-01-11 MED ORDER — METHYLENE BLUE 0.5 % INJ SOLN
INTRAVENOUS | Status: AC
  Filled 2017-01-11: qty 10

## 2017-01-11 MED ORDER — ONDANSETRON HCL 4 MG/2ML IJ SOLN
INTRAMUSCULAR | Status: DC | PRN
Start: 2017-01-11 — End: 2017-01-11
  Administered 2017-01-11: 4 mg via INTRAVENOUS

## 2017-01-11 MED ORDER — AZELASTINE HCL 0.1 % NA SOLN
1.0000 | Freq: Two times a day (BID) | NASAL | Status: DC
  Administered 2017-01-11 – 2017-01-12 (×2): 1 via NASAL
  Filled 2017-01-11: qty 30

## 2017-01-11 MED ORDER — DEXAMETHASONE SODIUM PHOSPHATE 10 MG/ML IJ SOLN
INTRAMUSCULAR | Status: AC
  Filled 2017-01-11: qty 1

## 2017-01-11 MED ORDER — LIDOCAINE 2% (20 MG/ML) 5 ML SYRINGE
INTRAMUSCULAR | Status: AC
  Filled 2017-01-11: qty 5

## 2017-01-11 MED ORDER — BUPIVACAINE-EPINEPHRINE (PF) 0.5% -1:200000 IJ SOLN
INTRAMUSCULAR | Status: DC | PRN
  Administered 2017-01-11: 20 mL via PERINEURAL

## 2017-01-11 MED ORDER — PHENYLEPHRINE HCL 10 MG/ML IJ SOLN
INTRAMUSCULAR | Status: DC | PRN
  Administered 2017-01-11 (×4): 80 ug via INTRAVENOUS

## 2017-01-11 MED ORDER — HYDRALAZINE HCL 20 MG/ML IJ SOLN: 10.0000 mg | INTRAMUSCULAR | Status: DC | PRN

## 2017-01-11 MED ORDER — EPHEDRINE SULFATE 50 MG/ML IJ SOLN
INTRAMUSCULAR | Status: DC | PRN
  Administered 2017-01-11: 10 mg via INTRAVENOUS

## 2017-01-11 MED ORDER — ONDANSETRON HCL 4 MG/2ML IJ SOLN: 4.0000 mg | Freq: Four times a day (QID) | INTRAMUSCULAR | Status: DC | PRN

## 2017-01-11 MED ORDER — HYDROCODONE-ACETAMINOPHEN 5-325 MG PO TABS: 1.0000 | ORAL_TABLET | ORAL | Status: DC | PRN

## 2017-01-11 SURGICAL SUPPLY — 75 items
ADH SKN CLS APL DERMABOND .7 (GAUZE/BANDAGES/DRESSINGS) ×1
ATCH SMKEVC FLXB CAUT HNDSWH (FILTER) ×1 IMPLANT
BINDER BREAST LRG (GAUZE/BANDAGES/DRESSINGS) ×2 IMPLANT
BINDER BREAST XLRG (GAUZE/BANDAGES/DRESSINGS) IMPLANT
BIOPATCH RED 1 DISK 7.0 (GAUZE/BANDAGES/DRESSINGS) ×2 IMPLANT
BIOPATCH RED 1IN DISK 7.0MM (GAUZE/BANDAGES/DRESSINGS) ×1
BNDG COHESIVE 4X5 TAN STRL (GAUZE/BANDAGES/DRESSINGS) ×3 IMPLANT
CANISTER SUCTION 2500CC (MISCELLANEOUS) ×3 IMPLANT
CHLORAPREP W/TINT 26ML (MISCELLANEOUS) ×3 IMPLANT
CLIP TI LARGE 6 (CLIP) ×3 IMPLANT
CLIP TI MEDIUM 6 (CLIP) ×3 IMPLANT
CLIP TI WIDE RED SMALL 6 (CLIP) ×3 IMPLANT
CLOSURE WOUND 1/2 X4 (GAUZE/BANDAGES/DRESSINGS) ×2
CONT SPEC 4OZ CLIKSEAL STRL BL (MISCELLANEOUS) ×9 IMPLANT
COVER PROBE W GEL 5X96 (DRAPES) ×3 IMPLANT
COVER SURGICAL LIGHT HANDLE (MISCELLANEOUS) ×5 IMPLANT
DERMABOND ADVANCED (GAUZE/BANDAGES/DRESSINGS) ×2
DERMABOND ADVANCED .7 DNX12 (GAUZE/BANDAGES/DRESSINGS) ×1 IMPLANT
DRAIN CHANNEL 19F RND (DRAIN) ×3 IMPLANT
DRSG PAD ABDOMINAL 8X10 ST (GAUZE/BANDAGES/DRESSINGS) ×4 IMPLANT
DRSG TEGADERM 4X4.75 (GAUZE/BANDAGES/DRESSINGS) ×3 IMPLANT
ELECT BLADE 4.0 EZ CLEAN MEGAD (MISCELLANEOUS) ×3
ELECT CAUTERY BLADE 6.4 (BLADE) ×3 IMPLANT
ELECT REM PT RETURN 9FT ADLT (ELECTROSURGICAL) ×3
ELECTRODE BLDE 4.0 EZ CLN MEGD (MISCELLANEOUS) IMPLANT
ELECTRODE REM PT RTRN 9FT ADLT (ELECTROSURGICAL) ×1 IMPLANT
EVACUATOR SILICONE 100CC (DRAIN) ×3 IMPLANT
EVACUATOR SMOKE ACCUVAC VALLEY (FILTER)
FILTER STRAW FLUID ASPIR (MISCELLANEOUS) IMPLANT
GLOVE BIO SURGEON STRL SZ 6 (GLOVE) ×3 IMPLANT
GLOVE BIO SURGEON STRL SZ7.5 (GLOVE) ×6 IMPLANT
GLOVE BIOGEL PI IND STRL 6.5 (GLOVE) ×1 IMPLANT
GLOVE BIOGEL PI IND STRL 7.0 (GLOVE) IMPLANT
GLOVE BIOGEL PI IND STRL 7.5 (GLOVE) IMPLANT
GLOVE BIOGEL PI IND STRL 8 (GLOVE) IMPLANT
GLOVE BIOGEL PI INDICATOR 6.5 (GLOVE) ×2
GLOVE BIOGEL PI INDICATOR 7.0 (GLOVE) ×4
GLOVE BIOGEL PI INDICATOR 7.5 (GLOVE) ×2
GLOVE BIOGEL PI INDICATOR 8 (GLOVE) ×2
GLOVE SURG SS PI 7.0 STRL IVOR (GLOVE) ×3 IMPLANT
GOWN STRL REUS W/ TWL LRG LVL3 (GOWN DISPOSABLE) ×1 IMPLANT
GOWN STRL REUS W/TWL 2XL LVL3 (GOWN DISPOSABLE) ×3 IMPLANT
GOWN STRL REUS W/TWL LRG LVL3 (GOWN DISPOSABLE) ×12
KIT BASIN OR (CUSTOM PROCEDURE TRAY) ×3 IMPLANT
KIT ROOM TURNOVER OR (KITS) ×3 IMPLANT
LIGHT WAVEGUIDE WIDE FLAT (MISCELLANEOUS) ×5 IMPLANT
MARKER SKIN DUAL TIP RULER LAB (MISCELLANEOUS) ×3 IMPLANT
NDL 18GX1X1/2 (RX/OR ONLY) (NEEDLE) IMPLANT
NDL SPNL 22GX3.5 QUINCKE BK (NEEDLE) ×1 IMPLANT
NEEDLE 18GX1X1/2 (RX/OR ONLY) (NEEDLE) IMPLANT
NEEDLE HYPO 25GX1X1/2 BEV (NEEDLE) IMPLANT
NEEDLE SPNL 22GX3.5 QUINCKE BK (NEEDLE) IMPLANT
NS IRRIG 1000ML POUR BTL (IV SOLUTION) ×3 IMPLANT
PACK GENERAL/GYN (CUSTOM PROCEDURE TRAY) ×3 IMPLANT
PACK UNIVERSAL I (CUSTOM PROCEDURE TRAY) ×3 IMPLANT
PAD ARMBOARD 7.5X6 YLW CONV (MISCELLANEOUS) ×3 IMPLANT
PREFILTER EVAC NS 1 1/3-3/8IN (MISCELLANEOUS) ×1 IMPLANT
SPECIMEN JAR X LARGE (MISCELLANEOUS) ×1 IMPLANT
SPONGE GAUZE 4X4 12PLY STER LF (GAUZE/BANDAGES/DRESSINGS) ×3 IMPLANT
SPONGE LAP 18X18 X RAY DECT (DISPOSABLE) ×3 IMPLANT
STAPLER VISISTAT 35W (STAPLE) ×3 IMPLANT
STOCKINETTE IMPERVIOUS 9X36 MD (GAUZE/BANDAGES/DRESSINGS) ×3 IMPLANT
STRIP CLOSURE SKIN 1/2X4 (GAUZE/BANDAGES/DRESSINGS) ×3 IMPLANT
SUT ETHILON 2 0 FS 18 (SUTURE) ×3 IMPLANT
SUT MON AB 4-0 PC3 18 (SUTURE) ×3 IMPLANT
SUT SILK 2 0 (SUTURE) ×3
SUT SILK 2 0 FS (SUTURE) ×3 IMPLANT
SUT SILK 2-0 18XBRD TIE 12 (SUTURE) ×1 IMPLANT
SUT VIC AB 3-0 SH 8-18 (SUTURE) ×5 IMPLANT
SYR 50ML LL SCALE MARK (SYRINGE) ×1 IMPLANT
SYR CONTROL 10ML LL (SYRINGE) IMPLANT
TOWEL OR 17X24 6PK STRL BLUE (TOWEL DISPOSABLE) ×3 IMPLANT
TOWEL OR 17X26 10 PK STRL BLUE (TOWEL DISPOSABLE) ×3 IMPLANT
TUBE CONNECTING 12'X1/4 (SUCTIONS)
TUBE CONNECTING 12X1/4 (SUCTIONS) ×1 IMPLANT

## 2017-01-11 NOTE — Interval H&P Note (Signed)
History and Physical Interval Note:  01/11/2017 11:23 AM  Danielle [name]  has presented today for surgery, with the diagnosis of RIGHT BREAST CANCER  The various methods of treatment have been discussed with the patient and family. After consideration of risks, benefits and other options for treatment, the patient has consented to  Procedure(s): MASTECTOMY WITH SENTINEL LYMPH NODE BIOPSY (Right) as a surgical intervention .  The patient's history has been reviewed, patient examined, no change in status, stable for surgery.  I have reviewed the patient's chart and labs.  Questions were answered to the patient's satisfaction.     [name]

## 2017-01-11 NOTE — Anesthesia Procedure Notes (Addendum)
Procedure Name: Intubation Performed by: Lelon Perla A Pre-anesthesia Checklist: Patient identified, Emergency Drugs available, Suction available, Patient being monitored and Timeout performed Oxygen Delivery Method: Circle system utilized Preoxygenation: Pre-oxygenation with 100% oxygen Intubation Type: IV induction Ventilation: Mask ventilation without difficulty Laryngoscope Size: Miller and 2 Grade View: Grade I Tube type: Oral Tube size: 7.0 mm Number of attempts: 3 (DL X 1 Patel RN ([facility]); DL x 1Paxton CRNA; DL x 1 Michala Deblanc MD) Airway Equipment and Method: Stylet Placement Confirmation: ETT inserted through vocal cords under direct vision and positive ETCO2 Secured at: 21 cm Tube secured with: Tape Dental Injury: Bloody posterior oropharynx  Difficulty Due To: Difficulty was unanticipated

## 2017-01-11 NOTE — Transfer of Care (Signed)
Immediate Anesthesia Transfer of Care Note  Patient: Danielle [name]  Procedure(s) Performed: Procedure(s): RIGHT MASTECTOMY WITH SENTINEL LYMPH NODE BIOPSY (Right)  Patient Location: PACU  Anesthesia Type:GA combined with regional for post-op pain  Level of Consciousness: awake, alert  and oriented  Airway & Oxygen Therapy: Patient Spontanous Breathing and Patient connected to nasal cannula oxygen  Post-op Assessment: Report given to RN and Post -op Vital signs reviewed and stable  Post vital signs: Reviewed and stable  Last Vitals:  Vitals:   01/11/17 0926 01/11/17 1415  BP: (!) 151/82 133/63  Pulse: 84 78  Resp: 16 16  Temp: 37 C 36.1 C    Last Pain: There were no vitals filed for this visit.    Patients Stated Pain Goal: 3 ([id number])  Complications: No apparent anesthesia complications

## 2017-01-11 NOTE — H&P (Signed)
Danielle [name]  Location: [facility] Patient #: [id number] DOB: 04-Nov-1936 Married / Language: English / Race: White Female   History of Present Illness  The patient is a 81 year old female who presents with breast cancer. Patient is a 81 year old female who presents for a second opinion regarding her right breast cancer. She saw Dr. Excell Seltzer, and was recommended to have a right-sided mastectomy. She currently has no pain, but itching from the biopsy site. She presented with a palpable mass in the right breast. Her diagnostic imaging revealed a 1.5 cm mass in the upper inner quadrant with a 0.7 cm satellite mass and a 2 cm area of linear calcifications in the upper outer quadrant. There are some additional pleomorphic calcifications that extended towards the nipple, for a total of 6 cm of calcifications. Biopsy of the palpable mass size invasive ductal carcinoma, triple negative. The biopsy of the calcifications was positive for hormone negative high grade DCIS. Axillary nodes were negative on imaging. Her mother had breast cancer.    U/S and dx mammo FINDINGS: Within the upper central portion of the right breast there is an irregular mass marked as palpable with a BB. On tomosynthesis images mass is spiculated a measures at least 2 cm. Adjacent to the larger component there is a small satellite nodule.  In the upper outer quadrant of the right breast there are numerous linear calcifications further evaluated on magnified views. On these views abnormal calcification span at least 2.0 x 1.2 x 1.6 cm. However there are additional scattered rounded calcification extending from this region in the upper-outer quadrant towards the nipple. Measured together, all calcifications measure 6.2 cm. The left breast is negative.  Mammographic images were processed with CAD.  On physical exam, I palpate a rounded mobile mass in the upper inner quadrant of the right breast.  Targeted  ultrasound is performed, showing an irregular hypoechoic mass in the 12:30 o'clock location of the right breast 5 cm from nipple which measures 1.5 x 1.0 x 1.2 cm. The adjacent satellite nodule is 0.7 cm. Internal blood flow is identified on Doppler evaluation. Evaluation of the axilla is negative for adenopathy. No sonographic correlate is identified for the calcifications in the upper-outer quadrant of the right breast.  IMPRESSION: 1. Palpable abnormality in the 12:30 o'clock location of the right breast is a mass with adjacent satellite nodule. Ultrasound-guided core biopsy is recommended for the larger component. 2. Suspicious calcifications in the upper-outer quadrant of the right breast for which stereotactic guided core biopsy is recommended.  RECOMMENDATION: 1. Ultrasound-guided core biopsy of mass in the 12:30 o'clock location of the right breast. 2. Stereotactic guided core biopsy of calcifications in the upper-outer quadrant of the right breast.  pathology Diagnosis 1. Breast, right, needle core biopsy, UOQ - HIGH GRADE DUCTAL CARCINOMA IN SITU WITH COMEDONECROSIS AND CALCIFICATIONS. 2. Breast, right, needle core biopsy, 12:30 o'clock - INVASIVE DUCTAL CARCINOMA, SEE COMMENT. Microscopic Comment 1. Prognostic markers will be ordered. 2. While grading is best performed on the resection specimen, the carcinoma appears grade 3.   Allergies  Codeine Phosphate *ANALGESICS - OPIOID*  Vomiting. OxyCODONE HCl *ANALGESICS - OPIOID*  Vomiting.  Medication History Atenolol (50MG  Tablet, Oral) Active. Azelastine HCl (0.1% Solution, Nasal) Active. Enalapril Maleate (10MG  Tablet, Oral) Active. Levothyroxine Sodium (100MCG Tablet, Oral) Active. Vitamin D (Ergocalciferol) (50000UNIT Capsule, Oral) Active. Zolpidem Tartrate ER (12.5MG  Tablet ER, Oral as needed) Active. Aspirin (81MG  Tablet, Oral) Active. Omeprazole (20MG  Tablet DR, Oral) Active. Medications  Reconciled    Review of Systems All other systems negative  Vitals  Weight: 122.4 lb Height: 63in Body Surface Area: 1.57 m Body Mass Index: 21.68 kg/m  Temp.: 98.21F  Pulse: 82 (Regular)  BP: 140/62 (Sitting, Left Arm, Standard)       Physical Exam General Mental Status-Alert. General Appearance-Consistent with stated age. Hydration-Well hydrated. Voice-Normal.  Head and Neck Head-normocephalic, atraumatic with no lesions or palpable masses.  Eye Sclera/Conjunctiva - Bilateral-No scleral icterus.  Chest and Lung Exam Chest and lung exam reveals -quiet, even and easy respiratory effort with no use of accessory muscles. Inspection Chest Wall - Normal. Back - normal.  Breast Note: Right breast -palpable 2 cm mass upper inner quadrant around 1 o'clock. mobile. firm. breast tissue denser in upper outer quadrant, and small old hematoma present. No LAD. No nipple retraction, nipple discharge, or skin dimpling. breasts are reasonably symmetric in size and ptosis, except there is a small contour difference at the location of the cancer. left breast - Nl, but dense.   Cardiovascular Cardiovascular examination reveals -normal pedal pulses bilaterally. Note: regular rate and rhythm  Abdomen Inspection-Inspection Normal. Palpation/Percussion Palpation and Percussion of the abdomen reveal - Soft, Non Tender, No Rebound tenderness, No Rigidity (guarding) and No hepatosplenomegaly.  Peripheral Vascular Upper Extremity Inspection - Bilateral - Normal - No Clubbing, No Cyanosis, No Edema, Pulses Intact. Lower Extremity Palpation - Edema - Bilateral - No edema.  Neurologic Neurologic evaluation reveals -alert and oriented x 3 with no impairment of recent or remote memory. Mental Status-Normal.  Musculoskeletal Global Assessment -Note: no gross deformities.  Normal Exam - Left-Upper Extremity Strength Normal and Lower Extremity  Strength Normal. Normal Exam - Right-Upper Extremity Strength Normal and Lower Extremity Strength Normal.  Lymphatic Head & Neck  General Head & Neck Lymphatics: Bilateral - Description - Normal. Axillary  General Axillary Region: Bilateral - Description - Normal. Tenderness - Non Tender.    Assessment & Plan CANCER OF RIGHT BREAST, STAGE 1, ESTROGEN RECEPTOR NEGATIVE (C50.911) Impression: Reviewed with patient her imaging findings on an anatomical diagram of the breast. I discussed that the known cancer is small in the upper inner quadrant. However, the area of known DCIS is 2 cm, but there are more suspicious calcifications heading toward the nipple. I discussed that if she wanted to try to pursue a double lumpectomy, that we would need to biopsy these calcifications anteriorly. I also discussed that normally an MRI might be helpful in this situation. She is unable to get an MRI because of the pacemaker. Discussed imaging with radiology.  I also reviewed that sometimes we find in attempting to pursue 2 lumpectomies in a small breast, that we frequently have issues with margins. She would be at very high risk to need additional surgeries, and we would have a breast that was not good cosmetically or functionally in trying to find a bra or prosthesis to fit.  She states that she has not concerned cosmetically about her breast and really does not want to get additional biopsies if it is not likely to simplify her surgery. She does think that it would be attractive to have fever surgeries. I discussed the risk of mastectomy including bleeding and infection as well as seroma. Reviewed that she would be in the hospital overnight and have it drained. I discussed the risk for additional procedures. I also discussed postoperative restrictions. She does have several horses, but does not currently ride them. She notes that she has a history  of clot in the past following surgery.  She will need to see  oncology pre op in order to evaluate for chemotherapy. We could place a port at the time of surgery.  45 min spent in evaluation, examination, counseling, and coordination of care. >50% spent in counseling. Current Plans Referred to Oncology, for evaluation and follow up (Oncology). Routine. Pt Education - [facility] Mastectomy [facility]   Signed by [name] Klein, MD

## 2017-01-11 NOTE — Anesthesia Preprocedure Evaluation (Signed)
Anesthesia Evaluation  Patient identified by MRN, date of birth, ID band Patient awake    Reviewed: Allergy & Precautions, NPO status , Patient's Chart, lab work & pertinent test results  History of Anesthesia Complications Negative for: history of anesthetic complications  Airway Mallampati: II  TM Distance: >3 FB Neck ROM: Full    Dental  (+) Partial Lower, Partial Upper   Pulmonary former smoker,    breath sounds clear to auscultation       Cardiovascular hypertension, + dysrhythmias + pacemaker  Rhythm:Regular     Neuro/Psych PSYCHIATRIC DISORDERS Anxiety Depression  Neuromuscular disease    GI/Hepatic Neg liver ROS, GERD  ,  Endo/Other  Hypothyroidism   Renal/GU negative Renal ROS     Musculoskeletal  (+) Arthritis ,   Abdominal   Peds  Hematology negative hematology ROS (+)   Anesthesia Other Findings   Reproductive/Obstetrics                             Anesthesia Physical Anesthesia Plan  ASA: II  Anesthesia Plan: General   Post-op Pain Management:  Regional for Post-op pain   Induction: Intravenous  Airway Management Planned: Oral ETT and LMA  Additional Equipment: None  Intra-op Plan:   Post-operative Plan: Extubation in OR  Informed Consent: I have reviewed the patients History and Physical, chart, labs and discussed the procedure including the risks, benefits and alternatives for the proposed anesthesia with the patient or authorized representative who has indicated his/her understanding and acceptance.   Dental advisory given  Plan Discussed with: CRNA and Surgeon  Anesthesia Plan Comments:         Anesthesia Quick Evaluation

## 2017-01-11 NOTE — Anesthesia Procedure Notes (Signed)
Performed by: Candis Shine

## 2017-01-11 NOTE — Anesthesia Procedure Notes (Signed)
Anesthesia Regional Block: Pectoralis block   Pre-Anesthetic Checklist: ,, timeout performed, Correct Patient, Correct Site, Correct Laterality, Correct Procedure, Correct Position, site marked, Risks and benefits discussed,  Surgical consent,  Pre-op evaluation,  At surgeon's request and post-op pain management  Laterality: Right  Prep: chloraprep       Needles:  Injection technique: Single-shot  Needle Type: Echogenic Stimulator Needle          Additional Needles:   Procedures: ultrasound guided,,,,,,,,  Narrative:  Start time: 01/11/2017 12:01 PM End time: 01/11/2017 12:06 PM Injection made incrementally with aspirations every 5 mL.  Performed by: Personally  Anesthesiologist: Jamorian Dimaria  Additional Notes: H+P and labs reviewed, risks and benefits discussed with patient, procedure tolerated well without complications

## 2017-01-11 NOTE — Op Note (Signed)
Right Mastectomy with Sentinel Node Biopsy Procedure Note  Indications: This patient presents with history of right breast cancer with clinically negative axillary lymph node exam.  Pre-operative Diagnosis: right breast cancer, [id number]  Post-operative Diagnosis: right breast cancer, same  Surgeon: [name] Klein   Anesthesia: General endotracheal anesthesia and pectoralis block  ASA Class: 2  Procedure Details  The patient was seen in the Holding Room. The risks, benefits, complications, treatment options, and expected outcomes were discussed with the patient. The possibilities of reaction to medication, pulmonary aspiration, bleeding, infection, the need for additional procedures, failure to diagnose a condition, and creating a complication requiring transfusion or operation were discussed with the patient. The patient concurred with the proposed plan, giving informed consent.  The site of surgery properly noted/marked. The patient was taken to Operating Room # 8, identified as Danielle [name] and the procedure verified as Right Mastectomy and Sentinel Node Biopsy. A Time Out was held and the above information confirmed.    After induction of anesthesia, the right arm, breast, and chest were prepped and draped in standard fashion.  The borders of the breast were identified and marked.  The incisions of the breast were drawn out to make sure incision lines were equidistant in length.    The superior incision was made with the #10 blade.   Mastectomy hooks were used to provide elevation of the skin edges, and the Bovie cautery was used to create the mastectomy flaps.  The dissection was taken to the fascia of the pectoralis major.  The penetrating vessels were clipped.  The superior flap was taken medially to the lateral sternal border, superiorly to the inferior border of the clavicle.  The inferior flap was similarly created, inferiorly to the inframammary fold and laterally to the border of the  latissimus.  The breast was taken off including the pectoralis fascia and the axillary tail marked.    Using a hand-held gamma probe, axillary sentinel nodes were identified.  Three level 2 axillary sentinel nodes were removed and submitted to pathology.  The findings are below.  The lymphovascular channels were clipped with metal clips.        The wound was irrigated.  One 19 Blake drains were placed laterally.   Hemostasis was achieved with cautery.  The wound was irrigated and closed with a 3-0 Vicryl deep dermal interrupted sutures and 4-0 Vicryl subcuticular closure in layers.    Sterile dressings were applied. At the end of the operation, all sponge, instrument, and needle counts were correct.  Findings: grossly clear surgical margins  Estimated Blood Loss:  less than 50 mL         Drains: One 19 Blake drain                Specimens: R breast and 3 axillary sentinel nodes         Complications:  None; patient tolerated the procedure well.         Disposition: PACU - hemodynamically stable.         Condition: stable

## 2017-01-12 DIAGNOSIS — F419 Anxiety disorder, unspecified: Secondary | ICD-10-CM | POA: Diagnosis not present

## 2017-01-12 DIAGNOSIS — E039 Hypothyroidism, unspecified: Secondary | ICD-10-CM | POA: Diagnosis not present

## 2017-01-12 DIAGNOSIS — C50211 Malignant neoplasm of upper-inner quadrant of right female breast: Secondary | ICD-10-CM | POA: Diagnosis not present

## 2017-01-12 DIAGNOSIS — F329 Major depressive disorder, single episode, unspecified: Secondary | ICD-10-CM | POA: Diagnosis not present

## 2017-01-12 DIAGNOSIS — K219 Gastro-esophageal reflux disease without esophagitis: Secondary | ICD-10-CM | POA: Diagnosis not present

## 2017-01-12 DIAGNOSIS — I10 Essential (primary) hypertension: Secondary | ICD-10-CM | POA: Diagnosis not present

## 2017-01-12 LAB — CBC
HEMATOCRIT: 27.5 % — AB (ref 36.0–46.0)
Hemoglobin: 9.3 g/dL — ABNORMAL LOW (ref 12.0–15.0)
MCH: 32.9 pg (ref 26.0–34.0)
MCHC: 33.8 g/dL (ref 30.0–36.0)
MCV: 97.2 fL (ref 78.0–100.0)
PLATELETS: 159 10*3/uL (ref 150–400)
RBC: 2.83 MIL/uL — ABNORMAL LOW (ref 3.87–5.11)
RDW: 12.8 % (ref 11.5–15.5)
WBC: 7.4 10*3/uL (ref 4.0–10.5)

## 2017-01-12 LAB — BASIC METABOLIC PANEL
ANION GAP: 6 (ref 5–15)
BUN: 14 mg/dL (ref 6–20)
CALCIUM: 9.5 mg/dL (ref 8.9–10.3)
CO2: 24 mmol/L (ref 22–32)
CREATININE: 0.95 mg/dL (ref 0.44–1.00)
Chloride: 104 mmol/L (ref 101–111)
GFR calc Af Amer: >60 mL/min (ref >60)
GFR, EST NON AFRICAN AMERICAN: 55 mL/min — AB (ref >60)
Glucose, Bld: 172 mg/dL — ABNORMAL HIGH (ref 65–99)
Potassium: 4.1 mmol/L (ref 3.5–5.1)
Sodium: 134 mmol/L — ABNORMAL LOW (ref 135–145)

## 2017-01-12 MED ORDER — TRAMADOL HCL 50 MG PO TABS: 50.0000 mg | ORAL_TABLET | Freq: Four times a day (QID) | ORAL | 1 refills | Status: DC | PRN

## 2017-01-12 NOTE — Progress Notes (Signed)
Danielle [name] to be D/C'd Home per MD order.  Discussed with the patient and all questions fully answered.  VSS, Skin clean, dry and intact without evidence of skin break down, no evidence of skin tears noted. IV catheter discontinued intact. Site without signs and symptoms of complications. Dressing and pressure applied.  An After Visit Summary was printed and given to the patient. Patient received prescription.  D/c education completed with patient/family including follow up instructions, medication list, d/c activities limitations if indicated, with other d/c instructions as indicated by MD - patient able to verbalize understanding, all questions fully answered.   Patient instructed to return to ED, call 911, or call MD for any changes in condition.   Patient to be escorted via [place], and D/C home via private auto.  Danielle [name] C 01/12/2017 11:24 AM

## 2017-01-13 NOTE — Anesthesia Postprocedure Evaluation (Addendum)
Anesthesia Post Note  Patient: Danielle [name]  Procedure(s) Performed: Procedure(s) (LRB): RIGHT MASTECTOMY WITH SENTINEL LYMPH NODE BIOPSY (Right)  Patient location during evaluation: PACU Anesthesia Type: Regional Level of consciousness: awake and alert Pain management: pain level controlled Vital Signs Assessment: post-procedure vital signs reviewed and stable Respiratory status: spontaneous breathing, nonlabored ventilation, respiratory function stable and patient connected to nasal cannula oxygen Cardiovascular status: blood pressure returned to baseline and stable Postop Assessment: no signs of nausea or vomiting Anesthetic complications: no       Last Vitals:  Vitals:   01/12/17 0243 01/12/17 0544  BP: 109/85 119/60  Pulse: 71 75  Resp: 17   Temp: 36.8 C 36.6 C    Last Pain:  Vitals:   01/12/17 0544  TempSrc: Oral  PainSc:                  Emogene Muratalla

## 2017-01-14 ENCOUNTER — Encounter (HOSPITAL_COMMUNITY): Payer: Self-pay | Admitting: General Surgery

## 2017-01-15 ENCOUNTER — Telehealth: Payer: Self-pay | Admitting: Hematology and Oncology

## 2017-01-15 NOTE — Telephone Encounter (Signed)
patient states that she just got out of the hospital and won't be able to make it to this apoointment. she says that she will talk to Dr. Barry Dienes about when and if she needs to reschedule

## 2017-01-15 NOTE — Progress Notes (Signed)
Please let patient know margins are negative and LN are negative.  Also, please make sure she has a post op appt.

## 2017-01-16 ENCOUNTER — Ambulatory Visit: Payer: Medicare Other | Admitting: Hematology and Oncology

## 2017-01-24 ENCOUNTER — Encounter: Payer: Self-pay | Admitting: Genetic Counselor

## 2017-01-25 ENCOUNTER — Other Ambulatory Visit: Payer: Self-pay | Admitting: Internal Medicine

## 2017-01-26 NOTE — Telephone Encounter (Signed)
Done hardcopy to Anna  

## 2017-01-26 NOTE — Telephone Encounter (Signed)
Faxed script back to [facility]...Lind Guest

## 2017-01-30 ENCOUNTER — Other Ambulatory Visit: Payer: Self-pay | Admitting: Internal Medicine

## 2017-02-01 ENCOUNTER — Telehealth: Payer: Self-pay | Admitting: Cardiology

## 2017-02-01 ENCOUNTER — Encounter: Payer: Medicare Other | Admitting: *Deleted

## 2017-02-01 NOTE — Telephone Encounter (Signed)
LMOVM reminding pt to send remote transmission.   

## 2017-02-02 ENCOUNTER — Encounter: Payer: Self-pay | Admitting: Cardiology

## 2017-02-07 ENCOUNTER — Telehealth: Payer: Self-pay | Admitting: *Deleted

## 2017-02-07 NOTE — Telephone Encounter (Signed)
Attempted to call patient about a follow up appointment with Dr. Lindi Adie.  Her mobile number listed had a voicemail that had not been set up.  Left message on her home phone for a return phone call.

## 2017-02-09 ENCOUNTER — Telehealth: Payer: Self-pay | Admitting: Internal Medicine

## 2017-02-09 ENCOUNTER — Ambulatory Visit (INDEPENDENT_AMBULATORY_CARE_PROVIDER_SITE_OTHER): Payer: Medicare Other | Admitting: *Deleted

## 2017-02-09 DIAGNOSIS — I442 Atrioventricular block, complete: Secondary | ICD-10-CM

## 2017-02-09 NOTE — Telephone Encounter (Signed)
New Message     Pt was suppose to do remote check at home, but she could not do it because she just had a mastectomy and would like to come in office to have it done   Putting pt to device clinic also

## 2017-02-09 NOTE — Telephone Encounter (Signed)
Transmission received, reviewed and will be normal. ApVp rhythm. 1 episode of V lead noise- during mastectomy. Will process transmission.

## 2017-02-09 NOTE — Progress Notes (Signed)
Remote pacemaker transmission.   

## 2017-02-09 NOTE — Telephone Encounter (Signed)
Spoke with Ms. Danielle [name]. She is uneasy about doing a remote transmission after mastectomy. She reports that she's been having a "flipping" feeling since the surgery and it makes her leery to do a transmission. I ensured her that since she's been having symptoms I would encourage her to send a transmission to check ppm function and presenting rhythm. I tried to explain that sending a transmission will not affect her mastectomy or do anything with her ppm that is unsafe. She verbalizes understanding and says she will send a transmission "if I'm feeling brave". I offered to call back with results of transmission once received- she declined.

## 2017-02-12 ENCOUNTER — Telehealth: Payer: Self-pay | Admitting: *Deleted

## 2017-02-12 NOTE — Telephone Encounter (Signed)
Called pt to discuss scheduling appt with Dr. Lindi Adie to discuss chemotherapy regimen. Pt relate she is not ready to make an appt with Dr. Lindi Adie until she sees Dr. Barry Dienes in 2 wks. Informed pt to please call us back once she made her decision to make an appt to see Dr. Lindi Adie. Received verbal understanding.

## 2017-02-15 ENCOUNTER — Encounter: Payer: Self-pay | Admitting: Cardiology

## 2017-02-19 LAB — CUP PACEART REMOTE DEVICE CHECK
Battery Impedance: 419 Ohm
Battery Remaining Longevity: 79 mo
Brady Statistic AP VP Percent: 69 %
Brady Statistic AP VS Percent: 0 %
Brady Statistic AS VP Percent: 31 %
Date Time Interrogation Session: 20180323183530
Implantable Lead Implant Date: 19970117
Implantable Lead Implant Date: 19970117
Implantable Lead Location: 753859
Implantable Lead Location: 753860
Implantable Lead Model: 4068
Lead Channel Impedance Value: 935 Ohm
Lead Channel Pacing Threshold Amplitude: 1.625 V
Lead Channel Pacing Threshold Pulse Width: 0.4 ms
Lead Channel Setting Pacing Amplitude: 2.5 V
Lead Channel Setting Pacing Amplitude: 3.25 V
Lead Channel Setting Pacing Pulse Width: 0.4 ms
Lead Channel Setting Sensing Sensitivity: 5.6 mV
MDC IDC MSMT BATTERY VOLTAGE: 2.78 V
MDC IDC MSMT LEADCHNL RA IMPEDANCE VALUE: 788 Ohm
MDC IDC MSMT LEADCHNL RA PACING THRESHOLD AMPLITUDE: 2.25 V
MDC IDC MSMT LEADCHNL RA PACING THRESHOLD PULSEWIDTH: 0.4 ms
MDC IDC MSMT LEADCHNL RA SENSING INTR AMPL: 1.4 mV
MDC IDC PG IMPLANT DT: 20130827
MDC IDC STAT BRADY AS VS PERCENT: 0 %

## 2017-02-22 ENCOUNTER — Ambulatory Visit (INDEPENDENT_AMBULATORY_CARE_PROVIDER_SITE_OTHER): Payer: Medicare Other | Admitting: Internal Medicine

## 2017-02-22 ENCOUNTER — Encounter: Payer: Self-pay | Admitting: Internal Medicine

## 2017-02-22 ENCOUNTER — Ambulatory Visit (INDEPENDENT_AMBULATORY_CARE_PROVIDER_SITE_OTHER)
Admission: RE | Admit: 2017-02-22 | Discharge: 2017-02-22 | Disposition: A | Discharge status: Home / Self Care | Payer: Medicare Other | Source: Ambulatory Visit | Attending: Internal Medicine | Admitting: Internal Medicine

## 2017-02-22 ENCOUNTER — Other Ambulatory Visit (INDEPENDENT_AMBULATORY_CARE_PROVIDER_SITE_OTHER): Payer: Medicare Other

## 2017-02-22 VITALS — BP 118/76 | HR 71 | Temp 98.6°F | Ht 64.0 in | Wt 114.0 lb

## 2017-02-22 DIAGNOSIS — R112 Nausea with vomiting, unspecified: Secondary | ICD-10-CM

## 2017-02-22 DIAGNOSIS — R319 Hematuria, unspecified: Secondary | ICD-10-CM

## 2017-02-22 DIAGNOSIS — R059 Cough, unspecified: Secondary | ICD-10-CM | POA: Insufficient documentation

## 2017-02-22 DIAGNOSIS — R05 Cough: Secondary | ICD-10-CM | POA: Insufficient documentation

## 2017-02-22 DIAGNOSIS — I517 Cardiomegaly: Secondary | ICD-10-CM | POA: Diagnosis not present

## 2017-02-22 HISTORY — DX: Hematuria, unspecified: R31.9

## 2017-02-22 LAB — HEPATIC FUNCTION PANEL
ALT: 18 U/L (ref 0–35)
AST: 22 U/L (ref 0–37)
Albumin: 4.6 g/dL (ref 3.5–5.2)
Alkaline Phosphatase: 79 U/L (ref 39–117)
BILIRUBIN DIRECT: 0.2 mg/dL (ref 0.0–0.3)
BILIRUBIN TOTAL: 1.1 mg/dL (ref 0.2–1.2)
Total Protein: 7.1 g/dL (ref 6.0–8.3)

## 2017-02-22 LAB — BASIC METABOLIC PANEL
BUN: 21 mg/dL (ref 6–23)
CALCIUM: 10.3 mg/dL (ref 8.4–10.5)
CO2: 29 meq/L (ref 19–32)
Chloride: 105 mEq/L (ref 96–112)
Creatinine, Ser: 0.96 mg/dL (ref 0.40–1.20)
GFR: 59.55 mL/min — ABNORMAL LOW (ref >60.00)
GLUCOSE: 127 mg/dL — AB (ref 70–99)
Potassium: 4 mEq/L (ref 3.5–5.1)
SODIUM: 140 meq/L (ref 135–145)

## 2017-02-22 LAB — CBC WITH DIFFERENTIAL/PLATELET
BASOS ABS: 0 10*3/uL (ref 0.0–0.1)
Basophils Relative: 0.7 % (ref 0.0–3.0)
EOS PCT: 1.1 % (ref 0.0–5.0)
Eosinophils Absolute: 0.1 10*3/uL (ref 0.0–0.7)
HCT: 38.4 % (ref 36.0–46.0)
Hemoglobin: 12.9 g/dL (ref 12.0–15.0)
LYMPHS ABS: 1.6 10*3/uL (ref 0.7–4.0)
Lymphocytes Relative: 24.4 % (ref 12.0–46.0)
MCHC: 33.6 g/dL (ref 30.0–36.0)
MCV: 98.6 fl (ref 78.0–100.0)
MONOS PCT: 9.6 % (ref 3.0–12.0)
Monocytes Absolute: 0.6 10*3/uL (ref 0.1–1.0)
NEUTROS ABS: 4.2 10*3/uL (ref 1.4–7.7)
NEUTROS PCT: 64.2 % (ref 43.0–77.0)
PLATELETS: 238 10*3/uL (ref 150.0–400.0)
RBC: 3.89 Mil/uL (ref 3.87–5.11)
RDW: 12.9 % (ref 11.5–15.5)
WBC: 6.6 10*3/uL (ref 4.0–10.5)

## 2017-02-22 LAB — LIPASE: Lipase: 53 U/L (ref 11.0–59.0)

## 2017-02-22 LAB — URINALYSIS, ROUTINE W REFLEX MICROSCOPIC
HGB URINE DIPSTICK: NEGATIVE
Ketones, ur: NEGATIVE
Nitrite: NEGATIVE
Specific Gravity, Urine: 1.025 (ref 1.000–1.030)
Total Protein, Urine: 30 — AB
URINE GLUCOSE: NEGATIVE
UROBILINOGEN UA: 0.2 (ref 0.0–1.0)
pH: 6 (ref 5.0–8.0)

## 2017-02-22 MED ORDER — ONDANSETRON HCL 4 MG PO TABS: 4.0000 mg | ORAL_TABLET | Freq: Three times a day (TID) | ORAL | 0 refills | Status: DC | PRN

## 2017-02-22 MED ORDER — ONDANSETRON 4 MG PO TBDP
4.0000 mg | ORAL_TABLET | Freq: Once | ORAL | Status: AC
  Administered 2017-02-22: 4 mg via ORAL

## 2017-02-22 MED ORDER — CEPHALEXIN 500 MG PO CAPS: 500.0000 mg | ORAL_CAPSULE | Freq: Three times a day (TID) | ORAL | 0 refills | Status: AC

## 2017-02-22 NOTE — Assessment & Plan Note (Signed)
Mild at best, non prod, no fever on exam, for cxr and labs asd,  to f/u any worsening symptoms or concerns

## 2017-02-22 NOTE — Assessment & Plan Note (Signed)
Per pt report this am, for ua and culture, empiric antibx seems reaonable pending results though admittedly little evidence on pe, consider further urology referral if ua c/w non infectious hematuria

## 2017-02-22 NOTE — Progress Notes (Signed)
Subjective:    Patient ID: Danielle [name], female    DOB: 06-04-1936, 81 y.o.   MRN: [id number]  HPI  Here to f/u with ? Of possible UTI; hx begins with most recently s/p mastectomy 2/22, and preop UA c/w uti, so tx with oral antibx preop as well, states now she had no symptoms.  Did very well postop surgical and has no surgical complications or infection. About 3/12 began to have "stomach flipping"  Then one night soon after with n/v x 2, did ok for about 2 wks , then with 2 days urinary freq, urgency, recurrent n/v and episode blood she believes on UA this am.  Had some constipation post op that resolved, o/w Denies worsening reflux, abd pain, dysphagia.  Denies f/c, but has mild non prod cough and sob in last few days as well of unclear significance.   Past Medical History:  Diagnosis Date  . ALLERGIC RHINITIS 01/09/2008  . ANEMIA-NOS 01/09/2008  . ANXIETY 01/09/2008  . ASTHMATIC BRONCHITIS, ACUTE 10/20/2008  . AV BLOCK, COMPLETE 12/16/2009  . DEPRESSION 01/09/2008  . Dizziness and giddiness 02/25/2010  . DVT, HX OF 01/09/2008  . GERD 01/09/2008  . GLUCOSE INTOLERANCE 01/09/2008  . History of kidney stones   . HYPERLIPIDEMIA 01/09/2008  . HYPERTENSION 01/09/2008  . HYPOTHYROIDISM 01/09/2008  . Impaired glucose tolerance 07/28/2011  . INSOMNIA-SLEEP DISORDER-UNSPEC 06/15/2009  . NEPHROLITHIASIS, HX OF 01/09/2008  . OSTEOARTHRITIS, CERVICAL SPINE 02/25/2010  . Presence of permanent cardiac pacemaker    medtronic   Past Surgical History:  Procedure Laterality Date  . ERCP W/ SPHICTEROTOMY    . HEMORRHOID SURGERY    . MASTECTOMY W/ SENTINEL NODE BIOPSY Right 01/11/2017   Procedure: RIGHT MASTECTOMY WITH SENTINEL LYMPH NODE BIOPSY;  Surgeon: [name] Klein, MD;  Location: [facility];  Service: General;  Laterality: Right;  . PACEMAKER PLACEMENT     MedTronic EnPulse [id number]  . PERMANENT PACEMAKER GENERATOR CHANGE N/A 07/16/2012   Procedure: PERMANENT PACEMAKER GENERATOR CHANGE;  Surgeon: Deboraha Sprang, MD;  Location: [facility] CATH LAB;  Service: Cardiovascular;  Laterality: N/A;  . POLYPECTOMY     Uterine    reports that she quit smoking about 45 years ago. Her smoking use included Cigarettes. She has a 5.00 pack-year smoking history. She has never used smokeless tobacco. She reports that she drinks alcohol. She reports that she does not use drugs. family history includes Cancer (age of onset: 51) in her mother; Pulmonary embolism in her father; Sudden death in her mother. Allergies  Allergen Reactions  . Codeine Nausea And Vomiting  . Oxycodone Nausea And Vomiting   Current Outpatient Prescriptions on File Prior to Visit  Medication Sig Dispense Refill  . acetaminophen (TYLENOL) 325 MG tablet Take 650 mg by mouth every 6 (six) hours as needed for mild pain.    Marland Kitchen aspirin 81 MG EC tablet Take 81 mg by mouth daily.      Marland Kitchen atenolol (TENORMIN) 50 MG tablet take 1 tablet by mouth once daily 90 tablet 1  . atorvastatin (LIPITOR) 10 MG tablet Take 1 tablet (10 mg total) by mouth daily. 90 tablet 3  . azelastine (ASTELIN) 0.1 % nasal spray Place 1 spray into both nostrils 2 (two) times daily. Use in each nostril as directed 30 mL 11  . Cholecalciferol (VITAMIN D) 2000 units CAPS Take 10,000 Units by mouth daily.    . enalapril (VASOTEC) 10 MG tablet take 1 tablet by mouth once daily 90 tablet  1  . levothyroxine (SYNTHROID, LEVOTHROID) 100 MCG tablet Take 1 tablet (100 mcg total) by mouth daily. 90 tablet 3  . omeprazole (PRILOSEC) 20 MG capsule Take 1 capsule (20 mg total) by mouth daily. 90 capsule 3  . traMADol (ULTRAM) 50 MG tablet take 1 tablet by mouth every 8 hours if needed 90 tablet 1  . Vitamin D, Ergocalciferol, (DRISDOL) 50000 units CAPS capsule Take 1 capsule (50,000 Units total) by mouth every 7 (seven) days. 12 capsule 0  . zolpidem (AMBIEN CR) 12.5 MG CR tablet take 1 tablet by mouth at bedtime if needed (Patient taking differently: Take 12.5 mg by mouth at bedtime as needed for  sleep. Only when traveling) 30 tablet 5   No current facility-administered medications on file prior to visit.    Review of Systems  Constitutional: Negative for other unusual diaphoresis or sweats HENT: Negative for ear discharge or swelling Eyes: Negative for other worsening visual disturbances Respiratory: Negative for stridor or other swelling  Gastrointestinal: Negative for worsening distension or other blood Genitourinary: Negative for retention Musculoskeletal: Negative for other MSK pain or swelling Skin: Negative for color change or other new lesions Neurological: Negative for worsening tremors and other numbness  Psychiatric/Behavioral: Negative for worsening agitation or other fatigue All other system neg per pt    Objective:   Physical Exam BP 118/76   Pulse 71   Temp 98.6 F (37 C) (Oral)   Ht 5\' 4"  (1.626 m)   Wt 114 lb (51.7 kg)   SpO2 98%   BMI 19.57 kg/m  VS noted,  Constitutional: Pt appears in NAD HENT: Head: NCAT.  Right Ear: External ear normal.  Left Ear: External ear normal.  Eyes: . Pupils are equal, round, and reactive to light. Conjunctivae and EOM are normal Nose: without d/c or deformity Neck: Neck supple. Gross normal ROM Cardiovascular: Normal rate and regular rhythm.   Pulmonary/Chest: Effort normal and breath sounds decreased without rales or wheezing.  Abd:  Soft, NT, ND, + BS, no organomegaly - benign exam, no flank tender Neurological: Pt is alert. At baseline orientation, motor grossly intact Skin: Skin is warm. No rashes, other new lesions, no LE edema Psychiatric: Pt behavior is normal without agitation 1-2+ nervous No other exam findings    Assessment & Plan:

## 2017-02-22 NOTE — Progress Notes (Signed)
Pre visit review using our clinic review tool, if applicable. No additional management support is needed unless otherwise documented below in the visit note. 

## 2017-02-22 NOTE — Patient Instructions (Addendum)
Please take all new medication as prescribed - the antibiotic, and the nausea medication  You had the nausea shot today  Please continue all other medications as before, and refills have been done if requested.  Please have the pharmacy call with any other refills you may need.  Please keep your appointments with your specialists as you may have planned  Please go to the XRAY Department in the Basement (go straight as you get off the elevator) for the x-ray testing  Please go to the LAB in the Basement (turn left off the elevator) for the tests to be done today  You will be contacted by phone if any changes need to be made immediately.  Otherwise, you will receive a letter about your results with an explanation, but please check with MyChart first.  Please remember to sign up for MyChart if you have not done so, as this will be important to you in the future with finding out test results, communicating by private email, and scheduling acute appointments online when needed.

## 2017-02-22 NOTE — Assessment & Plan Note (Addendum)
Etiology unclear, for zofran prn, labs as ordered - r/o UTI but exam not very supportive, ok for zofran 4 mg IM x 1

## 2017-02-23 LAB — URINE CULTURE: ORGANISM ID, BACTERIA: NO GROWTH

## 2017-03-01 ENCOUNTER — Encounter: Payer: Self-pay | Admitting: Cardiology

## 2017-03-01 ENCOUNTER — Telehealth: Payer: Self-pay | Admitting: Internal Medicine

## 2017-03-01 ENCOUNTER — Encounter: Payer: Self-pay | Admitting: Internal Medicine

## 2017-03-01 NOTE — Telephone Encounter (Signed)
Called patient to schedule awv. Patient does not have vm set up, could not leave message.

## 2017-03-02 ENCOUNTER — Telehealth: Payer: Self-pay

## 2017-03-02 NOTE — Telephone Encounter (Signed)
-----   Message from Juliet Rude, [place] sent at 02/26/2017 11:58 AM EDT ----- Regarding: FW: Medication Jonelle Sidle can you please help with this. I'm not sure how to fix it. An injection was given during that OV.  ----- Message ----- From: America Brown Sent: 02/26/2017  11:47 AM To: Juliet Rude, CMA Subject: Medication                                     Good morning Shirron,  For DOS 02/22/2017 a charge for ONDANSETRON ORAL has dropped, however, an injection fee also dropped.  Could you please review the chart and if the medication should have been an injection, addend the chart to reflect such?  Thank you in advance for your assistance, Kalman Shan

## 2017-03-02 NOTE — Telephone Encounter (Signed)
I have placed ticket with chris/[facility] to have incorrect route med order removed, injection has already been documented correctly

## 2017-03-05 ENCOUNTER — Encounter: Payer: Self-pay | Admitting: Internal Medicine

## 2017-03-05 MED ORDER — ONDANSETRON HCL 4 MG PO TABS: 4.0000 mg | ORAL_TABLET | Freq: Three times a day (TID) | ORAL | 0 refills | Status: DC | PRN

## 2017-03-05 NOTE — Telephone Encounter (Signed)
Pt called to make sure that this message got to Dr Jenny Reichmann. She thinks she may need a refill on the antibiotic. I told her that Dr Jenny Reichmann was out of the office today but that I would send the message back.

## 2017-03-07 ENCOUNTER — Encounter: Payer: Self-pay | Admitting: Internal Medicine

## 2017-03-07 ENCOUNTER — Ambulatory Visit (INDEPENDENT_AMBULATORY_CARE_PROVIDER_SITE_OTHER): Payer: Medicare Other | Admitting: Internal Medicine

## 2017-03-07 DIAGNOSIS — I10 Essential (primary) hypertension: Secondary | ICD-10-CM

## 2017-03-07 DIAGNOSIS — R7302 Impaired glucose tolerance (oral): Secondary | ICD-10-CM

## 2017-03-07 DIAGNOSIS — K30 Functional dyspepsia: Secondary | ICD-10-CM | POA: Insufficient documentation

## 2017-03-07 DIAGNOSIS — R1013 Epigastric pain: Secondary | ICD-10-CM | POA: Diagnosis not present

## 2017-03-07 MED ORDER — PANTOPRAZOLE SODIUM 40 MG PO TBEC
40.0000 mg | DELAYED_RELEASE_TABLET | Freq: Every day | ORAL | 3 refills | Status: DC
Start: 2017-03-07 — End: 2017-04-24

## 2017-03-07 NOTE — Progress Notes (Signed)
Subjective:    Patient ID: Danielle [name], female    DOB: [birth year], 81 y.o.   MRN: [id number]  HPI  Here to f/u after last seen x 2 wks ; had tx for possible UTi post mastectomy for breast ca, urine cx proved neg.  At last visit had n/v and urinary incontinence, tx with anbx.  Finished antibx.  Recent refill zofran done but has not yet picked up.  Still has ongoing symptoms - "Im a disaster." , first overall steady improved to start with last tx and has been able to feed the horses at home with mixing the feed;  Initially n/v improved and controlled, but still now with nausea though no vomiting but has been eating bland diet and does not want carbs or heavier foods.  Denies worsening reflux, abd pain, dysphagia,, or blood but did have constipation 3 days ago now resolved.. Though has not taken prilosec "for years" it is still on list;  pt admits now she is not taking it.  Had several lbs wt loss recent  She thinks with illness and diet change with nausea.   Wt Readings from Last 3 Encounters:  03/07/17 110 lb (49.9 kg)  02/22/17 114 lb (51.7 kg)  01/11/17 135 lb 12.9 oz (61.6 kg)  Recent labs done apr 5 unremarkable. BP Readings from Last 3 Encounters:  03/07/17 (!) 144/82  02/22/17 118/76  01/12/17 119/60   Pt denies fever, wt loss, night sweats, loss of appetite, or other constitutional symptoms except for the above.  Denies urinary symptoms such as dysuria, frequency, urgency, flank pain, hematuria or n/v, fever, chills, but drinking "gallons" of liquids and increased urination.  Pt denies polydipsia, polyuria,  Review of Systems  Constitutional: Negative for other unusual diaphoresis or sweats HENT: Negative for ear discharge or swelling Eyes: Negative for other worsening visual disturbances Respiratory: Negative for stridor or other swelling  Gastrointestinal: Negative for worsening distension or other blood Genitourinary: Negative for retention or other urinary  change Musculoskeletal: Negative for other MSK pain or swelling Skin: Negative for color change or other new lesions Neurological: Negative for worsening tremors and other numbness  Psychiatric/Behavioral: Negative for worsening agitation or other fatigue All other system neg per pt    Objective:   Physical Exam BP (!) 144/82   Pulse 90   Wt 110 lb (49.9 kg)   SpO2 97%   BMI 18.88 kg/m  VS noted, non toxic, no active vomiting Constitutional: Pt appears in NAD HENT: Head: NCAT.  Right Ear: External ear normal.  Left Ear: External ear normal.  Eyes: . Pupils are equal, round, and reactive to light. Conjunctivae and EOM are normal Nose: without d/c or deformity Neck: Neck supple. Gross normal ROM Cardiovascular: Normal rate and regular rhythm.   Pulmonary/Chest: Effort normal and breath sounds without rales or wheezing.  Abd:  Soft, NT, ND, + BS, no organomegaly - benign Neurological: Pt is alert. At baseline orientation, motor grossly intact Skin: Skin is warm. No rashes, other new lesions, no LE edema Psychiatric: Pt behavior is normal without agitation mild nervous only today No other exam findings   Lab Results  Component Value Date   WBC 6.6 02/22/2017   HGB 12.9 02/22/2017   HCT 38.4 02/22/2017   PLT 238.0 02/22/2017   GLUCOSE 127 (H) 02/22/2017   CHOL 253 (H) 05/11/2016   TRIG 214.0 (H) 05/11/2016   HDL 98.80 05/11/2016   LDLDIRECT 128.0 05/11/2016   LDLCALC 137 (H) 12/18/2014  ALT 18 02/22/2017   AST 22 02/22/2017   NA 140 02/22/2017   K 4.0 02/22/2017   CL 105 02/22/2017   CREATININE 0.96 02/22/2017   BUN 21 02/22/2017   CO2 29 02/22/2017   TSH 0.88 07/18/2016   INR 1.01 07/16/2012   HGBA1C 5.4 05/11/2016       Assessment & Plan:

## 2017-03-07 NOTE — Assessment & Plan Note (Signed)
stable overall by history and exam, recent data reviewed with pt, and pt to continue medical treatment as before,  to f/u any worsening symptoms or concerns Lab Results  Component Value Date   HGBA1C 5.4 05/11/2016

## 2017-03-07 NOTE — Progress Notes (Signed)
Pre visit review using our clinic review tool, if applicable. No additional management support is needed unless otherwise documented below in the visit note. 

## 2017-03-07 NOTE — Assessment & Plan Note (Signed)
stable overall by history and exam, recent data reviewed with pt, and pt to continue medical treatment as before,  to f/u any worsening symptoms or concerns BP Readings from Last 3 Encounters:  03/07/17 (!) 144/82  02/22/17 118/76  01/12/17 119/60

## 2017-03-07 NOTE — Assessment & Plan Note (Signed)
Most recent lab and cxr d/w pt; exam benign, I suspect gastritis vs dyspepsia, cant r/o ulcer, declines GI referral today but will try the protonix 40 qd, and call if not improved for referral back to Dr Fuller Plan, whom she is thinking will chastise her for not following up on the colonoscopy as recommended

## 2017-03-07 NOTE — Patient Instructions (Signed)
Please take all new medication as prescribed - the protonix for acid dyspepsia  Please call if not improved after 1 week for referral back to Dr Fuller Plan  Please continue all other medications as before, and refills have been done if requested.  Please have the pharmacy call with any other refills you may need.  Please keep your appointments with your specialists as you may have planned

## 2017-03-08 ENCOUNTER — Telehealth: Payer: Self-pay | Admitting: Oncology

## 2017-03-08 ENCOUNTER — Encounter: Payer: Self-pay | Admitting: Oncology

## 2017-03-08 NOTE — Telephone Encounter (Signed)
Tried calling the pt to schedule an appt for her to see Dr. Jana Hakim on 4/23 at 445pm. Unable to leave a vm on the highlighted phone number bc the vm wasn't setup. Cld the home number, but there were voices of kids on the vm. Didn't lv a vm bc I was unsure if this is the correct # for the pt and her spouse. Letter mailed.

## 2017-03-08 NOTE — Telephone Encounter (Signed)
Appt has been scheduled for the pt to see Dr. Jana Hakim on 4/23 at 445pm. Letter mailed to the pt.

## 2017-03-12 ENCOUNTER — Encounter: Payer: Self-pay | Admitting: Oncology

## 2017-03-12 ENCOUNTER — Ambulatory Visit: Payer: Medicare Other | Admitting: Oncology

## 2017-03-12 NOTE — Progress Notes (Deleted)
[place]  Telephone:([phone] Fax:([phone]) [phone]     ID: Danielle [name] DOB: 07-15-1936  MR#: [id number]  FEO#:[id number]  Patient Care Team: Biagio Borg, MD as PCP - General Chauncey Cruel, MD OTHER MD:  CHIEF COMPLAINT:   CURRENT TREATMENT:    BREAST CANCER HISTORY: From Dr. Gudina's01/24/2018 intake note:  "Danielle [name] 81 y.o. female is here because of recent diagnosis of right breast triple negative cancer. Patient had a palpable abnormality in the right breast which was evaluated by mammogram on 09/19/2016. This revealed a 1.5 cm lesion. Axillary ultrasound was negative. Biopsy of this mass was performed on 09/20/2016 and attributed triple negative invasive ductal carcinoma with high-grade DCIS with comedonecrosis and calcifications. She was initially seen by surgery but then she decided to change physicians. This along with holidays and viral illness postponed her treatment. 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 She lives in her home with her husband. She also has a daughter who lives locally. She has not informed any of them about her breast cancer diagnosis. She intends to tell them only on the day of surgery."  The patient underwent definitive surgery 01/11/2017 showing again triple-negative invasive ductal carcinoma with extensive ductal carcinoma in situ. All 8 lymph nodes including 3 sentinel lymph nodes were clear.  Her subsequent history is as detailed below  INTERVAL HISTORY: Her case was also presented in the multidisciplinary breast cancer conference that same morning. At that time a preliminary plan was proposed:  REVIEW OF SYSTEMS:  PAST MEDICAL HISTORY: Past Medical History:  Diagnosis Date  . ALLERGIC RHINITIS 01/09/2008  . ANEMIA-NOS 01/09/2008  . ANXIETY 01/09/2008  . ASTHMATIC BRONCHITIS, ACUTE 10/20/2008  . AV BLOCK, COMPLETE 12/16/2009  . DEPRESSION 01/09/2008  . Dizziness and giddiness 02/25/2010  . DVT, HX OF 01/09/2008  .  GERD 01/09/2008  . GLUCOSE INTOLERANCE 01/09/2008  . History of kidney stones   . HYPERLIPIDEMIA 01/09/2008  . HYPERTENSION 01/09/2008  . HYPOTHYROIDISM 01/09/2008  . Impaired glucose tolerance 07/28/2011  . INSOMNIA-SLEEP DISORDER-UNSPEC 06/15/2009  . NEPHROLITHIASIS, HX OF 01/09/2008  . OSTEOARTHRITIS, CERVICAL SPINE 02/25/2010  . Presence of permanent cardiac pacemaker    medtronic    PAST SURGICAL HISTORY: Past Surgical History:  Procedure Laterality Date  . ERCP W/ SPHICTEROTOMY    . HEMORRHOID SURGERY    . MASTECTOMY W/ SENTINEL NODE BIOPSY Right 01/11/2017   Procedure: RIGHT MASTECTOMY WITH SENTINEL LYMPH NODE BIOPSY;  Surgeon: [name] Klein, MD;  Location: [place];  Service: General;  Laterality: Right;  . PACEMAKER PLACEMENT     MedTronic EnPulse [id number]  . PERMANENT PACEMAKER GENERATOR CHANGE N/A 07/16/2012   Procedure: PERMANENT PACEMAKER GENERATOR CHANGE;  Surgeon: Deboraha Sprang, MD;  [facility] C[facility];  Service: Cardiovascular;  Laterality: N/A;  . POLYPECTOMY     Uterine    FAMILY HISTORY Family History  Problem Relation Age of Onset  . Sudden death Mother   . Cancer Mother 20    Breast  . Pulmonary embolism Father     Possible    GYNECOLOGIC HISTORY:  No LMP recorded. Patient is postmenopausal.   SOCIAL HISTORY:      ADVANCED DIRECTIVES:    HEALTH MAINTENANCE: Social History  Substance Use Topics  . Smoking status: Former Smoker    Packs/day: 0.20    Years: 25.00    Types: Cigarettes    Quit date: 11/21/1971  . Smokeless tobacco: Never Used  . Alcohol use Yes  Comment: wine     Colonoscopy:  PAP:  Bone density:   Allergies  Allergen Reactions  . Codeine Nausea And Vomiting  . Oxycodone Nausea And Vomiting    Current Outpatient Prescriptions  Medication Sig Dispense Refill  . acetaminophen (TYLENOL) 325 MG tablet Take 650 mg by mouth every 6 (six) hours as needed for mild pain.    Marland Kitchen aspirin 81 MG EC tablet Take 81 mg by mouth daily.       Marland Kitchen atenolol (TENORMIN) 50 MG tablet take 1 tablet by mouth once daily 90 tablet 1  . atorvastatin (LIPITOR) 10 MG tablet Take 1 tablet (10 mg total) by mouth daily. 90 tablet 3  . azelastine (ASTELIN) 0.1 % nasal spray Place 1 spray into both nostrils 2 (two) times daily. Use in each nostril as directed 30 mL 11  . Cholecalciferol (VITAMIN D) 2000 units CAPS Take 10,000 Units by mouth daily.    . enalapril (VASOTEC) 10 MG tablet take 1 tablet by mouth once daily 90 tablet 1  . levothyroxine (SYNTHROID, LEVOTHROID) 100 MCG tablet Take 1 tablet (100 mcg total) by mouth daily. 90 tablet 3  . ondansetron (ZOFRAN) 4 MG tablet Take 1 tablet (4 mg total) by mouth every 8 (eight) hours as needed for nausea or vomiting. 30 tablet 0  . pantoprazole (PROTONIX) 40 MG tablet Take 1 tablet (40 mg total) by mouth daily. 90 tablet 3  . traMADol (ULTRAM) 50 MG tablet take 1 tablet by mouth every 8 hours if needed 90 tablet 1  . Vitamin D, Ergocalciferol, (DRISDOL) 50000 units CAPS capsule Take 1 capsule (50,000 Units total) by mouth every 7 (seven) days. 12 capsule 0  . zolpidem (AMBIEN CR) 12.5 MG CR tablet take 1 tablet by mouth at bedtime if needed (Patient taking differently: Take 12.5 mg by mouth at bedtime as needed for sleep. Only when traveling) 30 tablet 5   No current facility-administered medications for this visit.     OBJECTIVE: There were no vitals filed for this visit.   There is no height or weight on file to calculate BMI.    ECOG FS:{CHL ONC AO:[id number]}  Ocular: Sclerae unicteric, pupils equal, round and reactive to light Ear-nose-throat: Oropharynx clear and moist Lymphatic: No cervical or supraclavicular adenopathy Lungs no rales or rhonchi, good excursion bilaterally Heart regular rate and rhythm, no murmur appreciated Abd soft, nontender, positive bowel sounds MSK no focal spinal tenderness, no joint edema Neuro: non-focal, well-oriented, appropriate affect Breasts:    LAB  RESULTS:  CMP     Component Value Date/Time   NA 140 02/22/2017 1653   K 4.0 02/22/2017 1653   CL 105 02/22/2017 1653   CO2 29 02/22/2017 1653   GLUCOSE 127 (H) 02/22/2017 1653   BUN 21 02/22/2017 1653   CREATININE 0.96 02/22/2017 1653   CREATININE 0.98 07/15/2012 1714   CALCIUM 10.3 02/22/2017 1653   PROT 7.1 02/22/2017 1653   ALBUMIN 4.6 02/22/2017 1653   AST 22 02/22/2017 1653   ALT 18 02/22/2017 1653   ALKPHOS 79 02/22/2017 1653   BILITOT 1.1 02/22/2017 1653   GFRNONAA 55 (L) 01/12/2017 0440   GFRNONAA 57 (L) 07/15/2012 1714   GFRAA >60 01/12/2017 0440   GFRAA 66 07/15/2012 1714    No results found for: TOTALPROTELP, ALBUMINELP, A1GS, A2GS, BETS, BETA2SER, GAMS, MSPIKE, SPEI  No results found for: KPAFRELGTCHN, LAMBDASER, KAPLAMBRATIO  Lab Results  Component Value Date   WBC 6.6 02/22/2017   NEUTROABS 4.2  02/22/2017   HGB 12.9 02/22/2017   HCT 38.4 02/22/2017   MCV 98.6 02/22/2017   PLT 238.0 02/22/2017      Chemistry      Component Value Date/Time   NA 140 02/22/2017 1653   K 4.0 02/22/2017 1653   CL 105 02/22/2017 1653   CO2 29 02/22/2017 1653   BUN 21 02/22/2017 1653   CREATININE 0.96 02/22/2017 1653   CREATININE 0.98 07/15/2012 1714      Component Value Date/Time   CALCIUM 10.3 02/22/2017 1653   ALKPHOS 79 02/22/2017 1653   AST 22 02/22/2017 1653   ALT 18 02/22/2017 1653   BILITOT 1.1 02/22/2017 1653       No results found for: LABCA2  No components found for: [id number]  No results for input(s): INR in the last 168 hours.  Urinalysis    Component Value Date/Time   COLORURINE YELLOW 02/22/2017 1653   APPEARANCEUR CLEAR 02/22/2017 1653   LABSPEC 1.025 02/22/2017 1653   PHURINE 6.0 02/22/2017 1653   GLUCOSEU NEGATIVE 02/22/2017 1653   HGBUR NEGATIVE 02/22/2017 1653   BILIRUBINUR SMALL (A) 02/22/2017 1653   KETONESUR NEGATIVE 02/22/2017 1653   PROTEINUR NEGATIVE 01/08/2017 1355   UROBILINOGEN 0.2 02/22/2017 1653   NITRITE NEGATIVE  02/22/2017 1653   LEUKOCYTESUR TRACE (A) 02/22/2017 1653     STUDIES: Dg Chest 2 View  Result Date: 02/23/2017 CLINICAL DATA:  Nausea and vomiting. EXAM: CHEST  2 VIEW COMPARISON:  02/16/2011 . FINDINGS: Cardiac pacer with lead tips in right atrium right ventricle. Mild cardiomegaly. No pulmonary venous congestion . No focal infiltrate. No pleural effusion or pneumothorax. Thoracic spine scoliosis and degenerative change. IMPRESSION: 1. Cardiac pacer with lead tips in right atrium right ventricle. Mild cardiomegaly. No pulmonary venous congestion. 2.  No acute cardiopulmonary disease. Electronically Signed   By: Marcello Moores  Register   On: 02/23/2017 08:16    ELIGIBLE FOR AVAILABLE RESEARCH PROTOCOL: ***  ASSESSMENT: 81 y.o. [place] woman status post right breast upper outer quadrant biopsy 2, showing invasive ductal carcinoma, grade 3, triple negative, with an MIB-1 of 80%. There was also high-grade ductal carcinoma noted in this specimen ([id number])  (1) status post right mastectomy with sentinel lymph node sampling 01/11/2017 for a pT2 pN0 invasive ductal carcinoma, grade 3, stage IIB in the 2018 prognostic staging system     PLAN: We spent the better part of today's hour-long appointment discussing the biology of breast cancer in general, and the specifics of the patient's tumor in particular.    Danielle [name] has a good understanding of the overall plan. She agrees with it. She knows the goal of treatment in her case is cure. She will call with any problems that may develop before her next visit here.  Chauncey Cruel, MD   03/12/2017 1:52 PM Medical Oncology and Hematology [facility] [address] [place]  [address] Tel. [phone]    Fax. [phone]   Nyra Jabs is amazing response last on your diet is assessment: Whereabouts is as was no history here okay

## 2017-03-15 ENCOUNTER — Ambulatory Visit (INDEPENDENT_AMBULATORY_CARE_PROVIDER_SITE_OTHER): Payer: Medicare Other | Admitting: Internal Medicine

## 2017-03-15 VITALS — BP 136/78 | HR 73 | Ht 63.0 in | Wt 118.0 lb

## 2017-03-15 DIAGNOSIS — R1013 Epigastric pain: Secondary | ICD-10-CM

## 2017-03-15 DIAGNOSIS — I872 Venous insufficiency (chronic) (peripheral): Secondary | ICD-10-CM

## 2017-03-15 DIAGNOSIS — S90221A Contusion of right lesser toe(s) with damage to nail, initial encounter: Secondary | ICD-10-CM

## 2017-03-15 DIAGNOSIS — S90229A Contusion of unspecified lesser toe(s) with damage to nail, initial encounter: Secondary | ICD-10-CM

## 2017-03-15 HISTORY — DX: Venous insufficiency (chronic) (peripheral): I87.2

## 2017-03-15 HISTORY — DX: Contusion of unspecified lesser toe(s) with damage to nail, initial encounter: S90.229A

## 2017-03-15 NOTE — Patient Instructions (Signed)
OK to watch the right great toenail as it will heal  You should wear steel toed shoes around the horses to avoid further trauma  Please continue all other medications as before, and refills have been done if requested.  Please have the pharmacy call with any other refills you may need.  Please keep your appointments with your specialists as you may have planned

## 2017-03-15 NOTE — Assessment & Plan Note (Signed)
No significant edema but numerous varicosities to toes that give bluish hue, but no other evidence for vascular insufficiency, no ulcers or other rash or infection noted

## 2017-03-15 NOTE — Progress Notes (Signed)
Subjective:    Patient ID: Danielle [name], female    DOB: 24-Aug-1936, 81 y.o.   MRN: [id number]  HPI  Here to fu with concern about possible feet fungal infection, as well as the right great toenail discoloration.  Cannot recall any trauma and denies any neurovasc symptoms such as pain or swelling.  No ankle or other foot joint pain.  Pt denies chest pain, increased sob or doe, wheezing, orthopnea, PND, increased LE swelling, palpitations, dizziness or syncope.   PPi working well, has been able to eat well, gained 8 lbs without LE edema. Denies worsening reflux, abd pain, dysphagia, n/v, bowel change or blood.  Pt denies new neurological symptoms such as new headache, or facial or extremity weakness or numbness   Pt denies polydipsia, polyuria Wt Readings from Last 3 Encounters:  03/15/17 118 lb (53.5 kg)  03/07/17 110 lb (49.9 kg)  02/22/17 114 lb (51.7 kg)   Past Medical History:  Diagnosis Date  . ALLERGIC RHINITIS 01/09/2008  . ANEMIA-NOS 01/09/2008  . ANXIETY 01/09/2008  . ASTHMATIC BRONCHITIS, ACUTE 10/20/2008  . AV BLOCK, COMPLETE 12/16/2009  . DEPRESSION 01/09/2008  . Dizziness and giddiness 02/25/2010  . DVT, HX OF 01/09/2008  . GERD 01/09/2008  . GLUCOSE INTOLERANCE 01/09/2008  . History of kidney stones   . HYPERLIPIDEMIA 01/09/2008  . HYPERTENSION 01/09/2008  . HYPOTHYROIDISM 01/09/2008  . Impaired glucose tolerance 07/28/2011  . INSOMNIA-SLEEP DISORDER-UNSPEC 06/15/2009  . NEPHROLITHIASIS, HX OF 01/09/2008  . OSTEOARTHRITIS, CERVICAL SPINE 02/25/2010  . Presence of permanent cardiac pacemaker    medtronic   Past Surgical History:  Procedure Laterality Date  . ERCP W/ SPHICTEROTOMY    . HEMORRHOID SURGERY    . MASTECTOMY W/ SENTINEL NODE BIOPSY Right 01/11/2017   Procedure: RIGHT MASTECTOMY WITH SENTINEL LYMPH NODE BIOPSY;  Surgeon: [name] Klein, MD;  Location: [place];  Service: General;  Laterality: Right;  . PACEMAKER PLACEMENT     MedTronic EnPulse [id number]  . PERMANENT  PACEMAKER GENERATOR CHANGE N/A 07/16/2012   Procedure: PERMANENT PACEMAKER GENERATOR CHANGE;  Surgeon: Deboraha Sprang, MD;  Location: [facility] CATH LAB;  Service: Cardiovascular;  Laterality: N/A;  . POLYPECTOMY     Uterine    reports that she quit smoking about 45 years ago. Her smoking use included Cigarettes. She has a 5.00 pack-year smoking history. She has never used smokeless tobacco. She reports that she drinks alcohol. She reports that she does not use drugs. family history includes Cancer (age of onset: 24) in her mother; Pulmonary embolism in her father; Sudden death in her mother. Allergies  Allergen Reactions  . Codeine Nausea And Vomiting  . Oxycodone Nausea And Vomiting   Current Outpatient Prescriptions on File Prior to Visit  Medication Sig Dispense Refill  . acetaminophen (TYLENOL) 325 MG tablet Take 650 mg by mouth every 6 (six) hours as needed for mild pain.    Marland Kitchen aspirin 81 MG EC tablet Take 81 mg by mouth daily.      Marland Kitchen atenolol (TENORMIN) 50 MG tablet take 1 tablet by mouth once daily 90 tablet 1  . atorvastatin (LIPITOR) 10 MG tablet Take 1 tablet (10 mg total) by mouth daily. 90 tablet 3  . azelastine (ASTELIN) 0.1 % nasal spray Place 1 spray into both nostrils 2 (two) times daily. Use in each nostril as directed 30 mL 11  . Cholecalciferol (VITAMIN D) 2000 units CAPS Take 10,000 Units by mouth daily.    . enalapril (VASOTEC) 10 MG  tablet take 1 tablet by mouth once daily 90 tablet 1  . levothyroxine (SYNTHROID, LEVOTHROID) 100 MCG tablet Take 1 tablet (100 mcg total) by mouth daily. 90 tablet 3  . ondansetron (ZOFRAN) 4 MG tablet Take 1 tablet (4 mg total) by mouth every 8 (eight) hours as needed for nausea or vomiting. 30 tablet 0  . pantoprazole (PROTONIX) 40 MG tablet Take 1 tablet (40 mg total) by mouth daily. 90 tablet 3  . traMADol (ULTRAM) 50 MG tablet take 1 tablet by mouth every 8 hours if needed 90 tablet 1  . Vitamin D, Ergocalciferol, (DRISDOL) 50000 units CAPS  capsule Take 1 capsule (50,000 Units total) by mouth every 7 (seven) days. 12 capsule 0  . zolpidem (AMBIEN CR) 12.5 MG CR tablet take 1 tablet by mouth at bedtime if needed (Patient taking differently: Take 12.5 mg by mouth at bedtime as needed for sleep. Only when traveling) 30 tablet 5   No current facility-administered medications on file prior to visit.    Review of Systems  Constitutional: Negative for other unusual diaphoresis or sweats HENT: Negative for ear discharge or swelling Eyes: Negative for other worsening visual disturbances Respiratory: Negative for stridor or other swelling  Gastrointestinal: Negative for worsening distension or other blood Genitourinary: Negative for retention or other urinary change Musculoskeletal: Negative for other MSK pain or swelling Skin: Negative for color change or other new lesions Neurological: Negative for worsening tremors and other numbness  Psychiatric/Behavioral: Negative for worsening agitation or other fatigue All other system neg per pt    Objective:   Physical Exam BP 136/78   Pulse 73   Ht 5\' 3"  (1.6 m)   Wt 118 lb (53.5 kg)   SpO2 98%   BMI 20.90 kg/m  VS noted,  Constitutional: Pt appears in NAD HENT: Head: NCAT.  Right Ear: External ear normal.  Left Ear: External ear normal.  Eyes: . Pupils are equal, round, and reactive to light. Conjunctivae and EOM are normal Nose: without d/c or deformity Neck: Neck supple. Gross normal ROM Cardiovascular: Normal rate and regular rhythm.   Pulmonary/Chest: Effort normal and breath sounds without rales or wheezing.  Abd:  Soft, NT, ND, + BS, no organomegaly Neurological: Pt is alert. At baseline orientation, motor grossly intact Skin: Skin is warm. No rashes, other new lesions, no LE edema; does have bilat feet dense bluish varicosities but o/w warm distal feet, dorsalis pedis trace to 1+ bilat; right great toenail with dark area c/w subungual hematoma, small NT and nail not  overly loose Psychiatric: Pt behavior is normal without agitation  No other exam findings    Assessment & Plan:

## 2017-03-15 NOTE — Assessment & Plan Note (Signed)
Resolved with PPI, to cont tx as is,  to f/u any worsening symptoms or concerns

## 2017-03-15 NOTE — Assessment & Plan Note (Addendum)
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

## 2017-03-15 NOTE — Progress Notes (Signed)
Pre visit review using our clinic review tool, if applicable. No additional management support is needed unless otherwise documented below in the visit note. 

## 2017-03-22 ENCOUNTER — Telehealth: Payer: Self-pay | Admitting: *Deleted

## 2017-03-22 NOTE — Telephone Encounter (Signed)
Attempted to call patient again.  Left message on voicemail although the voices on the message were kids.  Her mobile number has a voicemail that has not been set up.  Message sent to Dr. Barry Dienes that we are unable to reach patient.

## 2017-04-05 ENCOUNTER — Other Ambulatory Visit: Payer: Self-pay | Admitting: Oncology

## 2017-04-13 ENCOUNTER — Telehealth: Payer: Self-pay | Admitting: *Deleted

## 2017-04-13 NOTE — Telephone Encounter (Signed)
Received a request from [facility] for a new appt w/ a letter to be sent to the patient.  I scheduled pt, mailed letter w/ appt calendar to pt as requested.

## 2017-04-23 ENCOUNTER — Other Ambulatory Visit: Payer: Self-pay | Admitting: Adult Health

## 2017-04-23 DIAGNOSIS — Z171 Estrogen receptor negative status [ER-]: Principal | ICD-10-CM

## 2017-04-23 DIAGNOSIS — C50211 Malignant neoplasm of upper-inner quadrant of right female breast: Secondary | ICD-10-CM

## 2017-04-23 NOTE — Addendum Note (Signed)
Addendum  created 04/23/17 1127 by Tenicia Gural, MD   Sign clinical note    

## 2017-04-24 ENCOUNTER — Other Ambulatory Visit (HOSPITAL_BASED_OUTPATIENT_CLINIC_OR_DEPARTMENT_OTHER): Payer: Medicare Other

## 2017-04-24 ENCOUNTER — Ambulatory Visit (HOSPITAL_BASED_OUTPATIENT_CLINIC_OR_DEPARTMENT_OTHER): Payer: Medicare Other | Admitting: Oncology

## 2017-04-24 DIAGNOSIS — C50811 Malignant neoplasm of overlapping sites of right female breast: Secondary | ICD-10-CM | POA: Diagnosis not present

## 2017-04-24 DIAGNOSIS — Z171 Estrogen receptor negative status [ER-]: Secondary | ICD-10-CM | POA: Diagnosis not present

## 2017-04-24 DIAGNOSIS — C50211 Malignant neoplasm of upper-inner quadrant of right female breast: Secondary | ICD-10-CM | POA: Diagnosis present

## 2017-04-24 DIAGNOSIS — C50819 Malignant neoplasm of overlapping sites of unspecified female breast: Secondary | ICD-10-CM | POA: Insufficient documentation

## 2017-04-24 DIAGNOSIS — Z803 Family history of malignant neoplasm of breast: Secondary | ICD-10-CM | POA: Diagnosis not present

## 2017-04-24 LAB — CBC WITH DIFFERENTIAL/PLATELET
BASO%: 0.9 % (ref 0.0–2.0)
Basophils Absolute: 0 10*3/uL (ref 0.0–0.1)
EOS ABS: 0.1 10*3/uL (ref 0.0–0.5)
EOS%: 2.1 % (ref 0.0–7.0)
HCT: 35.3 % (ref 34.8–46.6)
HGB: 11.6 g/dL (ref 11.6–15.9)
LYMPH%: 33.6 % (ref 14.0–49.7)
MCH: 31.5 pg (ref 25.1–34.0)
MCHC: 32.9 g/dL (ref 31.5–36.0)
MCV: 95.9 fL (ref 79.5–101.0)
MONO#: 0.6 10*3/uL (ref 0.1–0.9)
MONO%: 11.5 % (ref 0.0–14.0)
NEUT#: 2.6 10*3/uL (ref 1.5–6.5)
NEUT%: 51.9 % (ref 38.4–76.8)
PLATELETS: 158 10*3/uL (ref 145–400)
RBC: 3.69 10*6/uL — AB (ref 3.70–5.45)
RDW: 13.1 % (ref 11.2–14.5)
WBC: 5 10*3/uL (ref 3.9–10.3)
lymph#: 1.7 10*3/uL (ref 0.9–3.3)

## 2017-04-24 LAB — COMPREHENSIVE METABOLIC PANEL
ALT: 14 U/L (ref 0–55)
ANION GAP: 10 meq/L (ref 3–11)
AST: 19 U/L (ref 5–34)
Albumin: 4 g/dL (ref 3.5–5.0)
Alkaline Phosphatase: 79 U/L (ref 40–150)
BILIRUBIN TOTAL: 0.75 mg/dL (ref 0.20–1.20)
BUN: 18.7 mg/dL (ref 7.0–26.0)
CHLORIDE: 107 meq/L (ref 98–109)
CO2: 24 meq/L (ref 22–29)
Calcium: 9.5 mg/dL (ref 8.4–10.4)
Creatinine: 1 mg/dL (ref 0.6–1.1)
EGFR: 57 mL/min/{1.73_m2} — AB (ref >90)
Glucose: 122 mg/dl (ref 70–140)
POTASSIUM: 3.7 meq/L (ref 3.5–5.1)
SODIUM: 141 meq/L (ref 136–145)
Total Protein: 6.3 g/dL — ABNORMAL LOW (ref 6.4–8.3)

## 2017-04-24 NOTE — Progress Notes (Signed)
Danielle [name]  Telephone:([phone]) [phone] Fax:(336) [phone]     ID: Danielle [name] DOB: Feb 08, 1936  MR#: [id number]  MMH#:[id number]  Patient Care Team: Danielle Borg, [name] as PCP - [place], Danielle [name] as Consulting Physician (General Surgery) Danielle [name] as Consulting Physician (Oncology) Danielle [name] as Consulting Physician (Gastroenterology) Danielle [name] as Consulting Physician (Cardiology) Danielle [name] as Nurse Practitioner (Nurse Practitioner) Danielle [name] as Referring Physician (Orthopedic Surgery) Danielle [name] OTHER [name]:  CHIEF COMPLAINT: Triple negative breast cancer   CURRENT TREATMENT: Observation   BREAST CANCER HISTORY: Danielle [name] herself noted a change in her right breast sometime in September 2017. She brought this to medical attention and on 09/19/2016 had diagnostic bilateral mammography with tomography and [facility]. The breast density was category D. There was an irregular mass in the upper central breast. This was spiculated and measured at least 2 cm. There were also numerous linear calcifications in the upper outer quadrant of the right breast spanning a minimum of 2.0 cm. On physical exam there was a rounded mobile mass in the upper inner quadrant of the right breast. Ultrasound of the right breast confirmed an irregular hypoechoic mass at the 12:30 o'clock position 5 cm from the nipple measuring 1.5 cm, with a 0.7 cm satellite nodule.  On 09/20/2016 the patient underwent biopsy of the right breast 2. Biopsy #1 described as upper outer quadrant showed high-grade ductal carcinoma in situ estrogen and progesterone receptor negative. Right breast biopsy #2 described as 12:30 o'clock showed invasive ductal carcinoma, estrogen and progesterone receptor negative, HER-2 not amplified with an MIB-1 of 1.24 and the number per cell 1.80  , and the MIB-1  was 80%.  The patient met with my partner Danielle [name] January 2018. He recommended Taxotere and Cytoxan chemotherapy for 4 cycles after her surgery.  On 01/11/2017 the patient underwent right mastectomy with sentinel lymph node biopsy. The final pathology here ([id number]) showed in the right breast, invasive ductal carcinoma, grade 3, measuring 2.3 cm with negative margins. All 8 lymph nodes examined, including 3 sentinel lymph nodes, were clear.  The patient declined a follow-up appointment with Dr. Payton [name].She was scheduled to see me 03/12/2017 but did not show.  Her subsequent history is as detailed below  INTERVAL HISTORY: "Danielle [name]" was evaluated in the breast clinic 11/24/2016. She established herself in my care today  REVIEW OF SYSTEMS: She has arthritis pain involving particularly the right hip. This is been evaluated by orthopedics and there is a possibility of surgery at some point. Of course she has a history of complete heart block and is closely followed by electrophysiology. 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 She denies unusual headaches, visual changes, nausea, vomiting, dizziness, or gait imbalance. She denies any cough, phlegm production or pleurisy. There has been no change in bowel or bladder habits. A detailed review of systems today was otherwise stable  PAST MEDICAL HISTORY: Past Medical History:  Diagnosis Date  . ALLERGIC RHINITIS 01/09/2008  . ANEMIA-NOS 01/09/2008  . ANXIETY 01/09/2008  . ASTHMATIC BRONCHITIS, ACUTE 10/20/2008  . AV BLOCK, COMPLETE 12/16/2009  . DEPRESSION 01/09/2008  . Dizziness and giddiness 02/25/2010  . DVT, HX OF 01/09/2008  . GERD 01/09/2008  . GLUCOSE INTOLERANCE 01/09/2008  . History of kidney stones   . HYPERLIPIDEMIA 01/09/2008  .  HYPERTENSION 01/09/2008  . HYPOTHYROIDISM 01/09/2008  . Impaired glucose tolerance 07/28/2011  . INSOMNIA-SLEEP DISORDER-UNSPEC 06/15/2009  . NEPHROLITHIASIS,  HX OF 01/09/2008  . OSTEOARTHRITIS, CERVICAL SPINE 02/25/2010  . Presence of permanent cardiac pacemaker    medtronic    PAST SURGICAL HISTORY: Past Surgical History:  Procedure Laterality Date  . ERCP W/ SPHICTEROTOMY    . HEMORRHOID SURGERY    . MASTECTOMY W/ SENTINEL NODE BIOPSY Right 01/11/2017   Procedure: RIGHT MASTECTOMY WITH SENTINEL LYMPH NODE BIOPSY;  Surgeon: Danielle [name] Klein, [name];  Location: [place];  Service: General;  Laterality: Right;  . PACEMAKER PLACEMENT     MedTronic EnPulse [id number]  . PERMANENT PACEMAKER GENERATOR CHANGE N/A 07/16/2012   Procedure: PERMANENT PACEMAKER GENERATOR CHANGE;  Surgeon: Danielle [name];  Location: [facility] CATH LAB;  Service: Cardiovascular;  Laterality: N/A;  . POLYPECTOMY     Uterine    FAMILY HISTORY Family History  Problem Relation Age of Onset  . Sudden death Mother   . Cancer Mother 37       Breast  . Pulmonary embolism Father        Possible  The patient's father died in his 52s from a "clot to the heart". The patient's mother was diagnosed with breast cancer in her early 59s and died from metastatic breast cancer approximately 30 years later. A maternal aunt (her mother's only sister) was also diagnosed with breast cancer in her early 52s according to the patient. The patient had 2 brothers, 3 sisters. There is no other history of breast or ovarian cancer in the family to her knowledge  GYNECOLOGIC HISTORY:  No LMP recorded. Patient is postmenopausal. She can't remember age at menarche or time of menopause. She is GX P5, including one premature birth. 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 Her husband Danielle [name] ("gin") is a Psychologist, sport and exercise. 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 The patient's son Danielle [name] lives in [place] where he works as an Arboriculturist. Daughter Danielle [name] lives in [place] and is a Scientist, research (life sciences).  Daughter Danielle [name] lives in [place] and is an Training and development officer as well as a third Land. Son Danielle [name] was born premature and did not survive. Daughter Luellen Pucker lives in [place] where she is a Art gallery manager with a fine arts degree. The patient has 4 grandchildren. She is a Nurse, learning disability.    ADVANCED DIRECTIVES: In place   HEALTH MAINTENANCE: Social History  Substance Use Topics  . Smoking status: Former Smoker    Packs/day: 0.20    Years: 25.00    Types: Cigarettes    Quit date: 11/21/1971  . Smokeless tobacco: Never Used  . Alcohol use Yes     Comment: wine     Colonoscopy: Due  PAP:  Bone density:   Allergies  Allergen Reactions  . Codeine Nausea And Vomiting  . Oxycodone Nausea And Vomiting    Current Outpatient Prescriptions  Medication Sig Dispense Refill  . atenolol (TENORMIN) 50 MG tablet take 1 tablet by mouth once daily 90 tablet 1  . atorvastatin (LIPITOR) 10 MG tablet Take 1 tablet (10 mg total) by mouth daily. 90 tablet 3  . Cholecalciferol (VITAMIN D) 2000 units CAPS Take 10,000 Units by mouth daily.    . enalapril (VASOTEC) 10 MG tablet take 1 tablet by mouth once daily 90 tablet 1  . levothyroxine (SYNTHROID,  LEVOTHROID) 100 MCG tablet Take 1 tablet (100 mcg total) by mouth daily. 90 tablet 3  . azelastine (ASTELIN) 0.1 % nasal spray Place 1 spray into both nostrils 2 (two) times daily. Use in each nostril as directed (Patient not taking: Reported on 04/24/2017) 30 mL 11  . Vitamin D, Ergocalciferol, (DRISDOL) 50000 units CAPS capsule Take 50,000 Units by mouth once a week.  0  . zolpidem (AMBIEN CR) 12.5 MG CR tablet take 1 tablet by mouth at bedtime if needed (Patient not taking: Reported on 04/24/2017) 30 tablet 5   No current facility-administered medications for this visit.     OBJECTIVE: Late middle-aged white woman in no acute distress Vitals:   04/24/17 1607  BP: (!) 147/98  Pulse: 82  Resp: 18  Temp: 98.1 F (36.7 C)     Body mass index is 20.35 kg/m.     ECOG FS:1 - Symptomatic but completely ambulatory  Ocular: Sclerae unicteric, pupils round and equal Ear-nose-throat: Oropharynx clear and moist Lymphatic: No cervical or supraclavicular adenopathy Lungs no rales or rhonchi Heart regular rate and rhythm, pacer in place Abd soft, nontender, positive bowel sounds MSK no focal spinal tenderness, no joint edema Neuro: non-focal, well-oriented, appropriate affect Breasts: Refused breast exam today   LAB RESULTS:  CMP     Component Value Date/Time   NA 141 04/24/2017 1546   K 3.7 04/24/2017 1546   CL 105 02/22/2017 1653   CO2 24 04/24/2017 1546   GLUCOSE 122 04/24/2017 1546   BUN 18.7 04/24/2017 1546   CREATININE 1.0 04/24/2017 1546   CALCIUM 9.5 04/24/2017 1546   PROT 6.3 (L) 04/24/2017 1546   ALBUMIN 4.0 04/24/2017 1546   AST 19 04/24/2017 1546   ALT 14 04/24/2017 1546   ALKPHOS 79 04/24/2017 1546   BILITOT 0.75 04/24/2017 1546   GFRNONAA 55 (L) 01/12/2017 0440   GFRNONAA 57 (L) 07/15/2012 1714   GFRAA >60 01/12/2017 0440   GFRAA 66 07/15/2012 1714    No results found for: TOTALPROTELP, ALBUMINELP, A1GS, A2GS, BETS, BETA2SER, GAMS, MSPIKE, SPEI  No results found for: Nils Pyle, [facility]  Lab Results  Component Value Date   WBC 5.0 04/24/2017   NEUTROABS 2.6 04/24/2017   HGB 11.6 04/24/2017   HCT 35.3 04/24/2017   MCV 95.9 04/24/2017   PLT 158 04/24/2017      Chemistry      Component Value Date/Time   NA 141 04/24/2017 1546   K 3.7 04/24/2017 1546   CL 105 02/22/2017 1653   CO2 24 04/24/2017 1546   BUN 18.7 04/24/2017 1546   CREATININE 1.0 04/24/2017 1546      Component Value Date/Time   CALCIUM 9.5 04/24/2017 1546   ALKPHOS 79 04/24/2017 1546   AST 19 04/24/2017 1546   ALT 14 04/24/2017 1546   BILITOT 0.75 04/24/2017 1546       No results found for: LABCA2  No components found for: [id number]  No results for input(s): INR in the last 168 hours.  Urinalysis    Component Value  Date/Time   COLORURINE YELLOW 02/22/2017 1653   APPEARANCEUR CLEAR 02/22/2017 1653   LABSPEC 1.025 02/22/2017 1653   PHURINE 6.0 02/22/2017 1653   GLUCOSEU NEGATIVE 02/22/2017 1653   HGBUR NEGATIVE 02/22/2017 1653   BILIRUBINUR SMALL (A) 02/22/2017 [place] 02/22/2017 1653   PROTEINUR NEGATIVE 01/08/2017 1355   UROBILINOGEN 0.2 02/22/2017 1653   NITRITE NEGATIVE 02/22/2017 1653   LEUKOCYTESUR TRACE (A) 02/22/2017 1653  STUDIES: Chest x-ray 02/22/2017 find the pacer with leads in the right atrium and right ventricle, with mild cardiomegaly, but no congestion and no acute disease.  ELIGIBLE FOR AVAILABLE RESEARCH PROTOCOL: no  ASSESSMENT: 81 y.o. [place] woman woman status post right breast overlapping site biopsies 09/20/2016 showing (a) invasive ductal carcinoma, estrogen and progesterone receptor negative, with an MIB-1 of 80%, and no HER-2 amplification. (b) ductal carcinoma in situ, high-grade, estrogen and progesterone receptor negative  (1) status post right mastectomy with sentinel lymph node and axillary lymph node sampling 01/11/2017 for a pT2 pN0, stage IIB invasive ductal carcinoma, grade 3, with negative margins   PLAN: We spent the better part of today's hour-long appointment discussing the biology of breast cancer in general, and the specifics of the patient's tumor in particular.We first reviewed the fact that cancer is not one disease but more than 100 different diseases and that it is important to keep them separate-- otherwise when friends and relatives discuss their own cancer experiences with Danielle [name] confusion can result. Similarly we explained that if breast cancer spreads to the bone or liver, the patient would not have bone cancer or liver cancer, but breast cancer in the bone and breast cancer in the liver: one cancer in three places-- not 3 different cancers which otherwise would have to be treated in 3 different ways.  We discussed the  difference between local and systemic therapy. In terms of loco-regional treatment, lumpectomy plus radiation is equivalent to mastectomy as far as survival is concerned. However in her case the extent of disease was such that lumpectomy even multiple lumpectomies would not have been advisable and she has had mastectomy.  We discussed the fact that mastectomy cures noninvasive breast cancer 99% of the time. Accordingly the only concern was her invasive breast cancer and we reviewed its size, stage, grade, prognostic profile, and its implications.  We then discussed the rationale for systemic therapy. There is some risk that this cancer may have already spread to other parts of her body. Patients frequently ask at this point about bone scans, CAT scans and PET scans to find out if they have occult breast cancer somewhere else. The problem is that in early stage disease we are much more likely to find false positives then true cancers and this would expose the patient to unnecessary procedures as well as unnecessary radiation. Scans cannot answer the question the patient really would like to know, which is whether she has microscopic disease elsewhere in her body. For those reasons we do not recommend them.  Of course we would proceed to aggressive evaluation of any symptoms that might suggest metastatic disease, but that is not the case here.  Next we went over the options for systemic therapy which are anti-estrogens, anti-HER-2 immunotherapy, and chemotherapy. Thao does not meet criteria for anti-HER-2 immunotherapy or anti-estrogens.  The question of chemotherapy is more complicated. This is the only available form of systemic therapy for Danielle [name], but it does have multiple side effects. The original plan was for adjuvant chemotherapy as noted above. However the patient refused a port and then rescheduled or did not show for medical oncology appointments. I think it is fair to say that  she was not eager  to receive chemotherapy. At this point I feel the interval from surgery is too long for me to say with certainty that chemotherapy would be in her interest. Given that uncertainty as well as her age and cardiac comorbidities I feel comfortable proceeding with  observation alone.  This was a great relief to her.  I do think she qualifies for genetics testing and I am putting in a request for that. I will see her again in approximately 3 months by which time I think we should have the genetics testing. Assuming all is well around that time I will start seeing her on a yearly basis thereafter  She knows to call for any other problems that may develop before that visit.        Danielle [name] has a good understanding of the overall plan. She agrees with it. She knows the goal of treatment in her case is cure. She will call with any problems that may develop before her next visit here.  Danielle [name]   04/24/2017 5:23 PM Medical Oncology and Hematology [facility] [address] [place], [place] [address] Tel. [phone]    Fax. [phone]

## 2017-04-30 DIAGNOSIS — Z853 Personal history of malignant neoplasm of breast: Secondary | ICD-10-CM | POA: Diagnosis not present

## 2017-05-14 ENCOUNTER — Encounter: Payer: Medicare Other | Admitting: *Deleted

## 2017-05-14 ENCOUNTER — Telehealth: Payer: Self-pay | Admitting: Cardiology

## 2017-05-14 NOTE — Telephone Encounter (Signed)
LMOVM reminding pt to send remote transmission.   

## 2017-05-18 ENCOUNTER — Encounter: Payer: Self-pay | Admitting: Cardiology

## 2017-05-24 ENCOUNTER — Other Ambulatory Visit: Payer: Medicare Other

## 2017-05-24 ENCOUNTER — Ambulatory Visit (HOSPITAL_BASED_OUTPATIENT_CLINIC_OR_DEPARTMENT_OTHER): Payer: Medicare Other | Admitting: Genetics

## 2017-05-24 ENCOUNTER — Encounter: Payer: Self-pay | Admitting: Genetics

## 2017-05-24 DIAGNOSIS — Z803 Family history of malignant neoplasm of breast: Secondary | ICD-10-CM

## 2017-05-24 DIAGNOSIS — Z315 Encounter for genetic counseling: Secondary | ICD-10-CM

## 2017-05-24 DIAGNOSIS — Z853 Personal history of malignant neoplasm of breast: Secondary | ICD-10-CM

## 2017-05-24 DIAGNOSIS — Z801 Family history of malignant neoplasm of trachea, bronchus and lung: Secondary | ICD-10-CM | POA: Diagnosis not present

## 2017-05-24 NOTE — Progress Notes (Signed)
REFERRING PROVIDER: Chauncey Cruel, MD [address] [place], [place] [address]  PRIMARY PROVIDER:  Biagio Borg, MD  PRIMARY REASON FOR VISIT:  1. Personal history of breast cancer   2. Family history of breast cancer     HISTORY OF PRESENT ILLNESS:   Ms. Danielle [name], a 81 y.o. female, was seen for a Danielle [name] cancer genetics consultation at the request of Dr. Jana [name] due to a personal and family history of breast cancer.  Ms. Danielle [name] presents to clinic today to discuss the possibility of a hereditary predisposition to cancer, genetic testing, and to further clarify her future cancer risks, as well as potential cancer risks for family members.   In November 2017, at the age of 70, Ms. Danielle [name] was diagnosed with triple negative invasive ductal carcinoma of the right breast. This was treated with mastectomy in February 2018. Ms. Danielle [name] reports that she has had one colonoscopy and is "probably overdue". She reports no history of polyps. She plans to call her gastroenterologist to schedule a colonoscopy.  CANCER HISTORY:    Breast cancer of upper-inner quadrant of right female breast ([place])   09/19/2016 Mammogram    Palpable abnormality 12:30 position right breast mass with satellite nodule. Mass measures 1.5 x 1 x 1.2 cm and satellite nodule 0.7 cm, axilla ultrasound negative. T1c N0 stage IA      09/20/2016 Initial Diagnosis    Right breast biopsy: 12:30 position: IDC grade 3, ER 0%, PR 0%, HER-2 negative ratio 1.24. High-grade DCIS with comedonecrosis and calcifications upper outer quadrant        Past Medical History:  Diagnosis Date  . ALLERGIC RHINITIS 01/09/2008  . ANEMIA-NOS 01/09/2008  . ANXIETY 01/09/2008  . ASTHMATIC BRONCHITIS, ACUTE 10/20/2008  . AV BLOCK, COMPLETE 12/16/2009  . DEPRESSION 01/09/2008  . Dizziness and giddiness 02/25/2010  . DVT, HX OF 01/09/2008  . GERD 01/09/2008  . GLUCOSE INTOLERANCE 01/09/2008  . History of kidney stones   . HYPERLIPIDEMIA  01/09/2008  . HYPERTENSION 01/09/2008  . HYPOTHYROIDISM 01/09/2008  . Impaired glucose tolerance 07/28/2011  . INSOMNIA-SLEEP DISORDER-UNSPEC 06/15/2009  . NEPHROLITHIASIS, HX OF 01/09/2008  . OSTEOARTHRITIS, CERVICAL SPINE 02/25/2010  . Presence of permanent cardiac pacemaker    medtronic    Past Surgical History:  Procedure Laterality Date  . ERCP W/ SPHICTEROTOMY    . HEMORRHOID SURGERY    . MASTECTOMY W/ SENTINEL NODE BIOPSY Right 01/11/2017   Procedure: RIGHT MASTECTOMY WITH SENTINEL LYMPH NODE BIOPSY;  Surgeon: [name] Klein, MD;  Location: [facility];  Service: General;  Laterality: Right;  . PACEMAKER PLACEMENT     MedTronic EnPulse [id number]  . PERMANENT PACEMAKER GENERATOR CHANGE N/A 07/16/2012   Procedure: PERMANENT PACEMAKER GENERATOR CHANGE;  Surgeon: Deboraha Sprang, MD;  Location: [facility] CATH LAB;  Service: Cardiovascular;  Laterality: N/A;  . POLYPECTOMY     Uterine    Social History   Social History  . Marital status: Married    Spouse name: N/A  . Number of children: N/A  . Years of education: N/A   Social History Main Topics  . Smoking status: Former Smoker    Packs/day: 0.20    Years: 25.00    Types: Cigarettes    Quit date: 11/21/1971  . Smokeless tobacco: Never Used  . Alcohol use Yes     Comment: wine  . Drug use: No  . Sexual activity: Not on file   Other Topics Concern  . Not on file   Social  History Narrative  . No narrative on file     FAMILY HISTORY:  We obtained a detailed, 4-generation family history.  Significant diagnoses are listed below: Family History  Problem Relation Age of Onset  . Sudden death Mother        d.74s  . Breast cancer Mother 69  . Pulmonary embolism Father        Possible  . Lung cancer Paternal Grandfather        history of smoking  . Breast cancer Maternal Aunt        d.78s   Ms. Danielle [name] has two sons. One died as a newborn from complications associated with prematurity. The other son is 38 without cancers. Ms. Danielle [name] has  three daughters, ages 47, 36, and 55. All are without cancers. Ms. Danielle [name] has two sisters. One is 6 without cancers. The other sister died in her 68s without cancer. Ms. Danielle [name] also has two brothers. One is 81 without cancers. The other died in his 36s without cancers.  Ms. Danielle [name]'s mother died in her late-70s. She had a history of breast cancer, though Ms. Danielle [name] reports that she does not know how old her mother was at the time of her diagnosis. Dr. Virgie Dad notes indicate that her mother was diagnosed in her early-40s. Ms. Danielle [name]'s only maternal aunt also had breast cancer. This aunt died in her late-70s and, as with her mother's diagnosis, Ms. Danielle [name] does not know the age at which her aunt was diagnosed with breast cancer. Again, Dr. Virgie Dad notes indicate this aunt was also diagnosed in her early-40s. Ms. Danielle [name] also had two maternal uncles who did not have cancer. Both of Ms. Danielle [name]'s maternal grandparents are deceased (ages at death are unknown). Neither grandparent was known to have cancer.  Ms. Danielle [name]'s father died at 45 without cancer. He was an only child. Ms. Danielle [name]'s paternal grandfather had lung cancer and had a history of smoking. Her paternal grandmother was not known to have cancers. Her grandparent's ages at death are not known.  Ms. Danielle [name] is unaware of previous family history of genetic testing for hereditary cancer risks. Patient's maternal ancestors are of [place] descent, and paternal ancestors are of Irish/Italian descent. There is no reported Ashkenazi Jewish ancestry. There is no known consanguinity.  GENETIC COUNSELING ASSESSMENT: KEYRY Danielle [name] is a 81 y.o. female with a personal and family which is somewhat suggestive of a hereditary cancer syndrome and predisposition to cancer. We, therefore, discussed and recommended the following at today's visit.   DISCUSSION: We reviewed the characteristics, features and inheritance patterns of hereditary cancer  syndromes. We also discussed genetic testing, including the appropriate family members to test, the process of testing, insurance coverage and turn-around-time for results. We discussed the implications of a negative, positive and/or variant of uncertain significant result. We recommended Ms. Meloni pursue genetic testing for the Common Hereditary Cancers Panel offered by [facility]. [facility]'s Common Hereditary Cancers Panel includes analysis of the following 46 genes: APC, ATM, AXIN2, BARD1, BMPR1A, BRCA1, BRCA2, BRIP1, CDH1, CDKN2A, CHEK2, CTNNA1, DICER1, EPCAM, GREM1, HOXB13, KIT, MEN1, MLH1, MSH2, MSH3, MSH6, MUTYH, NBN, NF1, NTHL1, PALB2, PDGFRA, PMS2, POLD1, POLE, PTEN, RAD50, RAD51C, RAD51D, SDHA, SDHB, SDHC, SDHD, SMAD4, SMARCA4, STK11, TP53, TSC1, TSC2, and VHL.   Based on Ms. Nicholl's personal and family history of cancer, she meets medical criteria for genetic testing. Despite that she meets criteria, she may still have an out of pocket cost. We discussed that if her out of pocket cost for  testing is over $100, the laboratory will call and confirm whether she wants to proceed with testing.  If the out of pocket cost of testing is less than $100 she will be billed by the genetic testing laboratory.   PLAN: After considering the risks, benefits, and limitations, Ms. Shadix  provided informed consent to pursue genetic testing and the blood sample was sent to [facility] for analysis of the 46-gene Common Hereditary Cancers Panel. Results should be available within approximately 3 weeks' time, at which point they will be disclosed by telephone to Ms. Catino, as will any additional recommendations warranted by these results. This information will also be available in Epic.   Lastly, we encouraged Ms. Griffing to remain in contact with cancer genetics annually so that we can continuously update the family history and inform her of any changes in cancer genetics and testing that may be of benefit  for this family.   Ms.  Beagle's questions were answered to her satisfaction today. Our contact information was provided should additional questions or concerns arise. Thank you for the referral and allowing [name] to share in the care of your patient.   Mal Misty, MS, [facility] Certified Naval [email or web address] phone: [phone]  The patient was seen for a total of 35 minutes in face-to-face genetic counseling.   _______________________________________________________________________ For Office Staff:  Number of people involved in session: 1 Was an Intern/ student involved with case: no

## 2017-05-27 ENCOUNTER — Other Ambulatory Visit: Payer: Self-pay | Admitting: Internal Medicine

## 2017-05-30 ENCOUNTER — Telehealth: Payer: Self-pay | Admitting: Genetics

## 2017-06-04 ENCOUNTER — Encounter: Payer: Self-pay | Admitting: Genetics

## 2017-06-04 ENCOUNTER — Ambulatory Visit: Payer: Self-pay | Admitting: Genetics

## 2017-06-04 DIAGNOSIS — Z1379 Encounter for other screening for genetic and chromosomal anomalies: Secondary | ICD-10-CM

## 2017-06-04 HISTORY — DX: Encounter for other screening for genetic and chromosomal anomalies: Z13.79

## 2017-06-04 NOTE — Telephone Encounter (Addendum)
Reviewed that germline genetic testing revealed no pathogenic mutations. This is considered to be a negative result. Testing was performed through [facility]'s 46-gene Common Hereditary Cancers Panel. [facility]'s Common Hereditary Cancers Panel includes analysis of the following 46 genes: APC, ATM, AXIN2, BARD1, BMPR1A, BRCA1, BRCA2, BRIP1, CDH1, CDKN2A, CHEK2, CTNNA1, DICER1, EPCAM, GREM1, HOXB13, KIT, MEN1, MLH1, MSH2, MSH3, MSH6, MUTYH, NBN, NF1, NTHL1, PALB2, PDGFRA, PMS2, POLD1, POLE, PTEN, RAD50, RAD51C, RAD51D, SDHA, SDHB, SDHC, SDHD, SMAD4, SMARCA4, STK11, TP53, TSC1, TSC2, and VHL.  For more detailed discussion, please see genetic counseling documentation from 06/04/2017. Result report dated 05/29/2017.   Ms. Danielle [name] requested that a copy of her genetic testing results and genetic counseling notes be mailed to her address on file. These will be mailed to her today.

## 2017-06-04 NOTE — Progress Notes (Addendum)
HPI: Ms. Caban was previously seen in the [facility] clinic on 05/24/2017 due to a personal and family history of cancer and concerns regarding a hereditary predisposition to cancer. Please refer to our prior cancer genetics clinic note for more information regarding Ms. Harston's medical, social and family histories, and our assessment and recommendations, at the time. Ms. Khatoon's recent genetic test results were disclosed to her, as were recommendations warranted by these results. These results and recommendations are discussed in more detail below.  CANCER HISTORY: In November 2017, at the age of 81, Ms. Morss was diagnosed with triple negative invasive ductal carcinoma of the right breast. This was treated with mastectomy in February 2018. Ms. Woodson reports that she has had one colonoscopy and is probably overdue. She reports no history of polyps. She plans to call her gastroenterologist to schedule a colonoscopy.    Breast cancer of upper-inner quadrant of right female breast ([place])   09/19/2016 Mammogram    Palpable abnormality 12:30 position right breast mass with satellite nodule. Mass measures 1.5 x 1 x 1.2 cm and satellite nodule 0.7 cm, axilla ultrasound negative. T1c N0 stage IA      09/20/2016 Initial Diagnosis    Right breast biopsy: 12:30 position: IDC grade 3, ER 0%, PR 0%, HER-2 negative ratio 1.24. High-grade DCIS with comedonecrosis and calcifications upper outer quadrant       Malignant neoplasm of overlapping sites of right breast in female, estrogen receptor negative ([place])   04/24/2017 Initial Diagnosis    Malignant neoplasm of overlapping sites of right breast in female, estrogen receptor negative ([place])     05/29/2017 Genetic Testing    Genetic counseling and testing for hereditary cancer syndromes was performed on 05/24/2017. Results are negative for pathogenic mutations in 46 genes analyzed by [facility]'s Common Hereditary Cancers Panel. Results are  dated 05/29/2017. Genes tested: APC, ATM, AXIN2, BARD1, BMPR1A, BRCA1, BRCA2, BRIP1, CDH1, CDKN2A, CHEK2, CTNNA1, DICER1, EPCAM, GREM1, HOXB13, KIT, MEN1, MLH1, MSH2, MSH3, MSH6, MUTYH, NBN, NF1, NTHL1, PALB2, PDGFRA, PMS2, POLD1, POLE, PTEN, RAD50, RAD51C, RAD51D, SDHA, SDHB, SDHC, SDHD, SMAD4, SMARCA4, STK11, TP53, TSC1, TSC2, and VHL.            FAMILY HISTORY:  We obtained a detailed, 4-generation family history.  Significant diagnoses are listed below: Family History  Problem Relation Age of Onset  . Sudden death Mother        d.58s  . Breast cancer Mother 31  . Pulmonary embolism Father        Possible  . Lung cancer Paternal Grandfather        history of smoking  . Breast cancer Maternal Aunt        d.78s   Ms. Klinge has two sons. One died as a newborn from complications associated with prematurity. The other son is 46 without cancers. Ms. Hoffman has three daughters, ages 55, 70, and 48. All are without cancers. Ms. Brener has two sisters. One is 68 without cancers. The other sister died in her 70s without cancer. Ms. Boise also has two brothers. One is 54 without cancers. The other died in his 18s without cancers.  Ms. Munce's mother died in her late-70s. She had a history of breast cancer, though Ms. Hochstein reports that she does not know how old her mother was at the time of her diagnosis. Dr. Virgie Dad notes indicate that her mother was diagnosed in her early-40s. Ms. Rains's only maternal aunt also had breast cancer. This aunt  died in her late-70s and, as with her mother's diagnosis, Ms. Ruddell does not know the age at which her aunt was diagnosed with breast cancer. Again, Dr. Virgie Dad notes indicate this aunt was also diagnosed in her early-40s. Ms. Empson also had two maternal uncles who did not have cancer. Both of Ms. Mata's maternal grandparents are deceased (ages at death are unknown). Neither grandparent was known to have cancer.  Ms. Verbrugge's  father died at 46 without cancer. He was an only child. Ms. Robards's paternal grandfather had lung cancer and had a history of smoking. Her paternal grandmother was not known to have cancers. Her grandparent's ages at death are not known.  Ms. Lucente is unaware of previous family history of genetic testing for hereditary cancer risks. Patient's maternal ancestors are of [place] descent, and paternal ancestors are of Irish/Italian descent. There is no reported Ashkenazi Jewish ancestry. There is no known consanguinity.  GENETIC TEST RESULTS: Genetic testing performed through [facility]'s Common Hereditary Cancers Panel reported out on 06/04/2017 showed no pathogenic mutations. [facility]'s Common Hereditary Cancers Panel includes analysis of the following 46 genes: APC, ATM, AXIN2, BARD1, BMPR1A, BRCA1, BRCA2, BRIP1, CDH1, CDKN2A, CHEK2, CTNNA1, DICER1, EPCAM, GREM1, HOXB13, KIT, MEN1, MLH1, MSH2, MSH3, MSH6, MUTYH, NBN, NF1, NTHL1, PALB2, PDGFRA, PMS2, POLD1, POLE, PTEN, RAD50, RAD51C, RAD51D, SDHA, SDHB, SDHC, SDHD, SMAD4, SMARCA4, STK11, TP53, TSC1, TSC2, and VHL.  The test report will be scanned into EPIC and will be located under the Molecular Pathology section of the Results Review tab.A portion of the result report is included below for reference.    We discussed with Ms. Adamek that since the current genetic testing is not perfect, it is possible there may be a gene mutation in one of these genes that current testing cannot detect, but that chance is small. We also discussed, that it is possible that another gene that has not yet been discovered, or that we have not yet tested, is responsible for the cancer diagnoses in the family. Therefore, important to remain in touch with cancer genetics in the future so that we can continue to offer Ms. Commisso the most up to date genetic testing.   CANCER SCREENING RECOMMENDATIONS: Though Ms. Proby's personal and family history of breast cancer remain  unexplained, this result indicates that it is unlikely Ms. Gamboa has an increased risk for a future cancer due to a mutation in one of these genes. Because no causative or actionable mutations were identified, it is recommended she continue to follow the cancer management and screening guidelines provided by her oncology and primary healthcare providers.   RECOMMENDATIONS FOR FAMILY MEMBERS: Women in this family might be at some increased risk of developing breast cancer, over the general population risk, simply due to the family history of cancer. We recommended women in this family have a yearly mammogram beginning at age 13, or 27 years younger than the earliest onset of cancer, an annual clinical breast exam, and perform monthly breast self-exams. Each of Ms. Sarli's close female relatives (daughters, sister, nieces) should discuss their family history of breast cancer with their physicians in order to establish a personalized breast cancer screening plan and to determine whether high-risk breast cancer screening through annual breast MRIs in addition to annual mammograms is indicated. Women in this family should also have a gynecological exam as recommended by their primary provider. All family members should have a colonoscopy by age 42.  Based on Ms. Mehlhoff's family history, it appears that each of  her siblings and nieces and nephews are candidates for genetic counseling and testing. Even extended family members such as maternal cousins may be candidates for testing. These family members should consult their physicians regarding their genetic counseling and testing options. Because Ms. Larue's genetic testing was negative, there is no genetic testing for hereditary cancer syndromes I recommend for her children due to her personal and family history of cancer. However, genetic testing may be indicated if there is a paternal family history suggestive of hereditary cancer risk.  FOLLOW-UP: Lastly,  we discussed with Ms. Ossa that cancer genetics is a rapidly advancing field and it is possible that new genetic tests will be appropriate for her and/or her family members in the future. We encouraged her to remain in contact with cancer genetics on an annual basis so we can update her personal and family histories and let her know of advances in cancer genetics that may benefit this family.   Our contact number was provided. Ms. Heindl's questions were answered to her satisfaction, and she knows she is welcome to call us at anytime with additional questions or concerns. Per Ms. Piggee's request, a copy of her genetic testing results and genetic counseling notes was mailed to her address on file on 06/04/2017.  Mal Misty, MS, [facility] Certified Naval [email or web address]

## 2017-06-25 DIAGNOSIS — C50911 Malignant neoplasm of unspecified site of right female breast: Secondary | ICD-10-CM | POA: Diagnosis not present

## 2017-06-25 DIAGNOSIS — Z171 Estrogen receptor negative status [ER-]: Secondary | ICD-10-CM | POA: Diagnosis not present

## 2017-06-27 ENCOUNTER — Other Ambulatory Visit: Payer: Self-pay | Admitting: General Surgery

## 2017-06-27 DIAGNOSIS — N644 Mastodynia: Secondary | ICD-10-CM

## 2017-06-28 ENCOUNTER — Other Ambulatory Visit: Payer: Self-pay | Admitting: General Surgery

## 2017-06-28 DIAGNOSIS — N644 Mastodynia: Secondary | ICD-10-CM

## 2017-07-04 ENCOUNTER — Ambulatory Visit
Admission: RE | Admit: 2017-07-04 | Discharge: 2017-07-04 | Disposition: A | Discharge status: Home / Self Care | Payer: Medicare Other | Source: Ambulatory Visit | Attending: General Surgery | Admitting: General Surgery

## 2017-07-04 DIAGNOSIS — N644 Mastodynia: Secondary | ICD-10-CM

## 2017-07-04 DIAGNOSIS — R922 Inconclusive mammogram: Secondary | ICD-10-CM | POA: Diagnosis not present

## 2017-07-04 DIAGNOSIS — N6489 Other specified disorders of breast: Secondary | ICD-10-CM | POA: Diagnosis not present

## 2017-07-17 ENCOUNTER — Ambulatory Visit (INDEPENDENT_AMBULATORY_CARE_PROVIDER_SITE_OTHER): Payer: Medicare Other | Admitting: *Deleted

## 2017-07-17 DIAGNOSIS — I442 Atrioventricular block, complete: Secondary | ICD-10-CM | POA: Diagnosis not present

## 2017-07-17 LAB — CUP PACEART REMOTE DEVICE CHECK
Battery Impedance: 493 Ohm
Battery Voltage: 2.77 V
Brady Statistic AP VP Percent: 78 %
Brady Statistic AP VS Percent: 0 %
Brady Statistic AS VS Percent: 0 %
Implantable Lead Implant Date: 19970117
Implantable Lead Implant Date: 19970117
Implantable Lead Location: 753859
Implantable Lead Location: 753860
Lead Channel Impedance Value: 862 Ohm
Lead Channel Pacing Threshold Amplitude: 1.625 V
Lead Channel Pacing Threshold Pulse Width: 0.4 ms
Lead Channel Pacing Threshold Pulse Width: 0.4 ms
Lead Channel Setting Pacing Amplitude: 3.25 V
Lead Channel Setting Sensing Sensitivity: 5.6 mV
MDC IDC MSMT BATTERY REMAINING LONGEVITY: 71 mo
MDC IDC MSMT LEADCHNL RA IMPEDANCE VALUE: 773 Ohm
MDC IDC MSMT LEADCHNL RA PACING THRESHOLD AMPLITUDE: 2.5 V
MDC IDC PG IMPLANT DT: 20130827
MDC IDC SESS DTM: 20180828201426
MDC IDC SET LEADCHNL RA PACING AMPLITUDE: 2.5 V
MDC IDC SET LEADCHNL RV PACING PULSEWIDTH: 0.4 ms
MDC IDC STAT BRADY AS VP PERCENT: 22 %

## 2017-07-25 ENCOUNTER — Other Ambulatory Visit (HOSPITAL_BASED_OUTPATIENT_CLINIC_OR_DEPARTMENT_OTHER): Payer: Medicare Other

## 2017-07-25 ENCOUNTER — Ambulatory Visit (HOSPITAL_BASED_OUTPATIENT_CLINIC_OR_DEPARTMENT_OTHER): Payer: Medicare Other | Admitting: Oncology

## 2017-07-25 VITALS — BP 147/70 | HR 67 | Temp 98.0°F | Resp 17 | Ht 63.0 in | Wt 113.2 lb

## 2017-07-25 DIAGNOSIS — Z853 Personal history of malignant neoplasm of breast: Secondary | ICD-10-CM

## 2017-07-25 DIAGNOSIS — Z171 Estrogen receptor negative status [ER-]: Secondary | ICD-10-CM

## 2017-07-25 DIAGNOSIS — Z9011 Acquired absence of right breast and nipple: Secondary | ICD-10-CM

## 2017-07-25 DIAGNOSIS — C50811 Malignant neoplasm of overlapping sites of right female breast: Secondary | ICD-10-CM

## 2017-07-25 LAB — CBC WITH DIFFERENTIAL/PLATELET
BASO%: 0.8 % (ref 0.0–2.0)
BASOS ABS: 0 10*3/uL (ref 0.0–0.1)
EOS%: 3.1 % (ref 0.0–7.0)
Eosinophils Absolute: 0.2 10*3/uL (ref 0.0–0.5)
HEMATOCRIT: 34.1 % — AB (ref 34.8–46.6)
HEMOGLOBIN: 11.1 g/dL — AB (ref 11.6–15.9)
LYMPH#: 1.9 10*3/uL (ref 0.9–3.3)
LYMPH%: 36.8 % (ref 14.0–49.7)
MCH: 31.4 pg (ref 25.1–34.0)
MCHC: 32.6 g/dL (ref 31.5–36.0)
MCV: 96.3 fL (ref 79.5–101.0)
MONO#: 0.6 10*3/uL (ref 0.1–0.9)
MONO%: 11 % (ref 0.0–14.0)
NEUT%: 48.3 % (ref 38.4–76.8)
NEUTROS ABS: 2.5 10*3/uL (ref 1.5–6.5)
Platelets: 160 10*3/uL (ref 145–400)
RBC: 3.54 10*6/uL — ABNORMAL LOW (ref 3.70–5.45)
RDW: 14.1 % (ref 11.2–14.5)
WBC: 5.2 10*3/uL (ref 3.9–10.3)

## 2017-07-25 LAB — COMPREHENSIVE METABOLIC PANEL
ALBUMIN: 4 g/dL (ref 3.5–5.0)
ALK PHOS: 75 U/L (ref 40–150)
ALT: 28 U/L (ref 0–55)
AST: 29 U/L (ref 5–34)
Anion Gap: 8 mEq/L (ref 3–11)
BILIRUBIN TOTAL: 1.21 mg/dL — AB (ref 0.20–1.20)
BUN: 27.4 mg/dL — AB (ref 7.0–26.0)
CO2: 26 mEq/L (ref 22–29)
CREATININE: 1.1 mg/dL (ref 0.6–1.1)
Calcium: 9.8 mg/dL (ref 8.4–10.4)
Chloride: 106 mEq/L (ref 98–109)
EGFR: 50 mL/min/{1.73_m2} — ABNORMAL LOW (ref >90)
Glucose: 77 mg/dl (ref 70–140)
Potassium: 4.3 mEq/L (ref 3.5–5.1)
Sodium: 140 mEq/L (ref 136–145)
TOTAL PROTEIN: 6.6 g/dL (ref 6.4–8.3)

## 2017-07-25 NOTE — Progress Notes (Signed)
[place]  [phone] [phone]     ID: Danielle [name] DOB: [birth year]  MR#: [id number]  GUY#:[id number]  Patient Care Team: Biagio Borg, MD as PCP - General Jovita Kussmaul, MD as Consulting Physician (General Surgery) Magrinat, Virgie Dad, MD as Consulting Physician (Oncology) Ladene Artist, MD as Consulting Physician (Gastroenterology) Deboraha Sprang, MD as Consulting Physician (Cardiology) Kennon Holter, NP as Nurse Practitioner (Nurse Practitioner) Laurance Flatten Estanislado Pandy, MD as Referring Physician (Orthopedic Surgery) Chauncey Cruel, MD OTHER MD:  CHIEF COMPLAINT: Triple negative breast cancer   CURRENT TREATMENT: Observation   BREAST CANCER HISTORY: From the original intake note:  Danielle [name] herself noted a change in her right breast sometime in September 2017. She brought this to medical attention and on 09/19/2016 had diagnostic bilateral mammography with [facility] The breast density was category D. There was an irregular mass in the upper central breast. This was spiculated and measured at least 2 cm. There were also numerous linear calcifications in the upper outer quadrant of the right breast spanning a minimum of 2.0 cm. On physical exam there was a rounded mobile mass in the upper inner quadrant of the right breast. Ultrasound of the right breast confirmed an irregular hypoechoic mass at the 12:30 o'clock position 5 cm from the nipple measuring 1.5 cm, with a 0.7 cm satellite nodule.  On 09/20/2016 the patient underwent biopsy of the right breast 2. Biopsy #1 described as upper outer quadrant showed high-grade ductal carcinoma in situ estrogen and progesterone receptor negative. Right breast biopsy #2 described as 12:30 o'clock showed invasive ductal carcinoma, estrogen and progesterone receptor negative, HER-2 not amplified with an MIB-1 of  1.24 and the number per cell 1.80 , and the MIB-1  was 80%.  The patient met with my partner Dr. Sonny Dandy January 2018. He recommended Taxotere and Cytoxan chemotherapy for 4 cycles after her surgery.  On 01/11/2017 the patient underwent right mastectomy with sentinel lymph node biopsy. The final pathology here ([id number]) showed in the right breast, invasive ductal carcinoma, grade 3, measuring 2.3 cm with negative margins. All 8 lymph nodes examined, including 3 sentinel lymph nodes, were clear.  The patient declined a follow-up appointment with Dr. Payton Mccallum.She was scheduled to see me 03/12/2017 but did not show.  Her subsequent history is as detailed below  INTERVAL HISTORY: "Danielle [name]" returns today for follow-up of her triple negative breast cancer. Since her last visit here she had an unusual sensation in her left breast possibly accompanied by some thickening on palpation. This was evaluated while left diagnostic mammography with tomography and left breast ultrasonography at the [facility] 07/04/2017. There was no evidence of malignancy. Note that breast density was category D.  This sensation has since resolved.  REVIEW OF SYSTEMS: She is very busy with her [place] and cold, and with her antique rose garden. She recently had a problem published by a [employer]. She is doing quite a bit of reading. Generally she is doing "super great". A detailed review of systems today was noncontributory  PAST MEDICAL HISTORY: Past Medical History:  Diagnosis Date  . ALLERGIC RHINITIS 01/09/2008  . ANEMIA-NOS 01/09/2008  . ANXIETY 01/09/2008  . ASTHMATIC BRONCHITIS, ACUTE 10/20/2008  . AV BLOCK, COMPLETE 12/16/2009  . DEPRESSION 01/09/2008  . Dizziness and giddiness 02/25/2010  . DVT, HX OF 01/09/2008  . Genetic testing 06/04/2017   Ms.  Boxley underwent genetic counseling and testing for hereditary cancer syndromes on 05/24/2017. Her results were negative for mutations in all 46 genes  analyzed by [facility]'s 46-gene Common Hereditary Cancers Panel. Genes analyzed include: APC, ATM, AXIN2, BARD1, BMPR1A, BRCA1, BRCA2, BRIP1, CDH1, CDKN2A, CHEK2, CTNNA1, DICER1, EPCAM, GREM1, HOXB13, KIT, MEN1, MLH1, MSH2, MSH3, MSH6, MUTYH, NBN  . GERD 01/09/2008  . GLUCOSE INTOLERANCE 01/09/2008  . History of kidney stones   . HYPERLIPIDEMIA 01/09/2008  . HYPERTENSION 01/09/2008  . HYPOTHYROIDISM 01/09/2008  . Impaired glucose tolerance 07/28/2011  . INSOMNIA-SLEEP DISORDER-UNSPEC 06/15/2009  . NEPHROLITHIASIS, HX OF 01/09/2008  . OSTEOARTHRITIS, CERVICAL SPINE 02/25/2010  . Presence of permanent cardiac pacemaker    medtronic    PAST SURGICAL HISTORY: Past Surgical History:  Procedure Laterality Date  . BREAST BIOPSY Right 09/20/2016   malignant  . ERCP W/ SPHICTEROTOMY    . HEMORRHOID SURGERY    . MASTECTOMY Right 01/11/2017  . MASTECTOMY W/ SENTINEL NODE BIOPSY Right 01/11/2017   Procedure: RIGHT MASTECTOMY WITH SENTINEL LYMPH NODE BIOPSY;  Surgeon: [name] Klein, MD;  Location: [facility];  Service: General;  Laterality: Right;  . PACEMAKER PLACEMENT     MedTronic EnPulse [id number]  . PERMANENT PACEMAKER GENERATOR CHANGE N/A 07/16/2012   Procedure: PERMANENT PACEMAKER GENERATOR CHANGE;  Surgeon: Deboraha Sprang, MD;  Location: [facility] CATH LAB;  Service: Cardiovascular;  Laterality: N/A;  . POLYPECTOMY     Uterine    FAMILY HISTORY Family History  Problem Relation Age of Onset  . Sudden death Mother        d.51s  . Breast cancer Mother 74  . Pulmonary embolism Father        Possible  . Lung cancer Paternal Grandfather        history of smoking  . Breast cancer Maternal Aunt        d.78s  The patient's father died in his 43s from a "clot to the heart". The patient's mother was diagnosed with breast cancer in her early 60s and died from metastatic breast cancer approximately 30 years later. A maternal aunt (her mother's only sister) was also diagnosed with breast cancer in her early 30s  according to the patient. The patient had 2 brothers, 3 sisters. There is no other history of breast or ovarian cancer in the family to her knowledge  GYNECOLOGIC HISTORY:  No LMP recorded. Patient is postmenopausal. She can't remember age at menarche or time of menopause. She is GX P5, including one premature birth. 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 Her husband Jeneen Rinks ("gin") is a Psychologist, sport and exercise. 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 The patient's son [name] lives in [place] where he works as an Arboriculturist. Daughter [name] lives in [place] and is a Scientist, research (life sciences). Daughter [name] lives in [place] and is an Training and development officer as well as a third Land. Son [name] Klein was born premature and did not survive. Daughter Luellen Pucker lives in [place] where she is a Art gallery manager with a fine arts degree. The patient has 4 grandchildren. She is a Nurse, learning disability.    ADVANCED DIRECTIVES: In place   HEALTH MAINTENANCE: Social History  Substance Use Topics  . Smoking status: Former Smoker    Packs/day: 0.20    Years: 25.00    Types: Cigarettes    Quit date: 11/21/1971  . Smokeless tobacco: Never Used  .  Alcohol use Yes     Comment: wine     Colonoscopy: Due  PAP:  Bone density:   Allergies  Allergen Reactions  . Codeine Nausea And Vomiting  . Oxycodone Nausea And Vomiting    Current Outpatient Prescriptions  Medication Sig Dispense Refill  . atenolol (TENORMIN) 50 MG tablet take 1 tablet by mouth once daily 90 tablet 1  . atorvastatin (LIPITOR) 10 MG tablet Take 1 tablet (10 mg total) by mouth daily. 90 tablet 3  . azelastine (ASTELIN) 0.1 % nasal spray Place 1 spray into both nostrils 2 (two) times daily. Use in each nostril as directed (Patient not taking: Reported on 04/24/2017) 30 mL 11  . Cholecalciferol (VITAMIN D) 2000 units CAPS Take 10,000 Units by mouth daily.    .  enalapril (VASOTEC) 10 MG tablet take 1 tablet by mouth once daily 90 tablet 1  . levothyroxine (SYNTHROID, LEVOTHROID) 100 MCG tablet take 1 tablet by mouth once daily 90 tablet 2  . Vitamin D, Ergocalciferol, (DRISDOL) 50000 units CAPS capsule Take 50,000 Units by mouth once a week.  0  . zolpidem (AMBIEN CR) 12.5 MG CR tablet take 1 tablet by mouth at bedtime if needed (Patient not taking: Reported on 04/24/2017) 30 tablet 5   No current facility-administered medications for this visit.     OBJECTIVE: Late middle-aged white womanWho appears well  Vitals:   07/25/17 1400  BP: (!) 147/70  Pulse: 67  Resp: 17  Temp: 98 F (36.7 C)  SpO2: 100%     Body mass index is 20.05 kg/m.    ECOG FS:0 - Asymptomatic  Sclerae unicteric, EOMs intact Oropharynx clear and moist No cervical or supraclavicular adenopathy Lungs no rales or rhonchi Heart regular rate and rhythm Abd soft, nontender, positive bowel sounds MSK no focal spinal tenderness, no upper extremity lymphedema Neuro: nonfocal, well oriented, appropriate affect Breasts: The right breast is status post mastectomy. There is no evidence of local recurrence. The left breast is benign. There are no suspicious masses. Both axillae are benign.    LAB RESULTS:  CMP     Component Value Date/Time   NA 141 04/24/2017 1546   K 3.7 04/24/2017 1546   CL 105 02/22/2017 1653   CO2 24 04/24/2017 1546   GLUCOSE 122 04/24/2017 1546   BUN 18.7 04/24/2017 1546   CREATININE 1.0 04/24/2017 1546   CALCIUM 9.5 04/24/2017 1546   PROT 6.3 (L) 04/24/2017 1546   ALBUMIN 4.0 04/24/2017 1546   AST 19 04/24/2017 1546   ALT 14 04/24/2017 1546   ALKPHOS 79 04/24/2017 1546   BILITOT 0.75 04/24/2017 1546   GFRNONAA 55 (L) 01/12/2017 0440   GFRNONAA 57 (L) 07/15/2012 1714   GFRAA >60 01/12/2017 0440   GFRAA 66 07/15/2012 1714    No results found for: TOTALPROTELP, ALBUMINELP, A1GS, A2GS, BETS, BETA2SER, GAMS, MSPIKE, SPEI  No results found for:  KPAFRELGTCHN, LAMBDASER, KAPLAMBRATIO  Lab Results  Component Value Date   WBC 5.2 07/25/2017   NEUTROABS 2.5 07/25/2017   HGB 11.1 (L) 07/25/2017   HCT 34.1 (L) 07/25/2017   MCV 96.3 07/25/2017   PLT 160 07/25/2017      Chemistry      Component Value Date/Time   NA 141 04/24/2017 1546   K 3.7 04/24/2017 1546   CL 105 02/22/2017 1653   CO2 24 04/24/2017 1546   BUN 18.7 04/24/2017 1546   CREATININE 1.0 04/24/2017 1546      Component Value Date/Time  CALCIUM 9.5 04/24/2017 1546   ALKPHOS 79 04/24/2017 1546   AST 19 04/24/2017 1546   ALT 14 04/24/2017 1546   BILITOT 0.75 04/24/2017 1546       No results found for: LABCA2  No components found for: EHUDJS970  No results for input(s): INR in the last 168 hours.  Urinalysis    Component Value Date/Time   COLORURINE YELLOW 02/22/2017 1653   APPEARANCEUR CLEAR 02/22/2017 1653   LABSPEC 1.025 02/22/2017 1653   PHURINE 6.0 02/22/2017 1653   GLUCOSEU NEGATIVE 02/22/2017 1653   HGBUR NEGATIVE 02/22/2017 1653   BILIRUBINUR SMALL (A) 02/22/2017 1653   KETONESUR NEGATIVE 02/22/2017 1653   PROTEINUR NEGATIVE 01/08/2017 1355   UROBILINOGEN 0.2 02/22/2017 1653   NITRITE NEGATIVE 02/22/2017 1653   LEUKOCYTESUR TRACE (A) 02/22/2017 1653     STUDIES: Diagnostic left mammography with tomography and left breast ultrasonography at the [facility] 07/04/2017 found the breast density to be category D. There was no evidence of malignancy.  ELIGIBLE FOR AVAILABLE RESEARCH PROTOCOL: no  ASSESSMENT: 81 y.o. [place] woman woman status post right breast overlapping site biopsies 09/20/2016 showing (a) invasive ductal carcinoma, estrogen and progesterone receptor negative, with an MIB-1 of 80%, and no HER-2 amplification. (b) ductal carcinoma in situ, high-grade, estrogen and progesterone receptor negative  (1) status post right mastectomy with sentinel lymph node and axillary lymph node sampling 01/11/2017 for a pT2 pN0, stage  IIB invasive ductal carcinoma, grade 3, with negative margins  (2) genetics testing 05/24/2017 through [facility]'s Common Hereditary Cancers Panel found no deleterious mutations in APC, ATM, AXIN2, BARD1, BMPR1A, BRCA1, BRCA2, BRIP1, CDH1, CDKN2A, CHEK2, CTNNA1, DICER1, EPCAM, GREM1, HOXB13, KIT, MEN1, MLH1, MSH2, MSH3, MSH6, MUTYH, NBN, NF1, NTHL1, PALB2, PDGFRA, PMS2, POLD1, POLE, PTEN, RAD50, RAD51C, RAD51D, SDHA, SDHB, SDHC, SDHD, SMAD4, SMARCA4, STK11, TP53, TSC1, TSC2, and VHL.  (3) the patient opted against adjuvant chemotherapy  PLAN: Danielle [name] is now a little over 6 months out from definitive surgery for her breast cancer with no evidence of disease recurrence. This is favorable.  She wonders if white she had recently wise not reflux but pancreatitis. We discussed this at length and she understands if she had pancreatitis she would be feeling "super great". Hopefully she was reassured.  She just saw Dr. Barry Dienes 2 weeks ago. Very likely she will see Dr. Barry Dienes again in one year which makes sense as that will be shortly after her mammography.  Accordingly I'm going to see her in March. This is close to her surgical anniversary and so it will be helpful in that regard  I will start seeing her on a once a year basis from the next visit  She knows to call for any other problems that may develop before then.          Chauncey Cruel, MD   07/25/2017 2:25 PM Medical Oncology and Hematology [facility] [address] [place],  [address] Tel. [phone]    Fax. [phone]

## 2017-07-26 NOTE — Progress Notes (Signed)
Remote pacemaker transmission.   

## 2017-07-27 ENCOUNTER — Encounter: Payer: Self-pay | Admitting: Cardiology

## 2017-09-10 DIAGNOSIS — Z681 Body mass index (BMI) 19 or less, adult: Secondary | ICD-10-CM | POA: Diagnosis not present

## 2017-09-10 DIAGNOSIS — Z779 Other contact with and (suspected) exposures hazardous to health: Secondary | ICD-10-CM | POA: Diagnosis not present

## 2017-10-16 ENCOUNTER — Other Ambulatory Visit: Payer: Self-pay | Admitting: Internal Medicine

## 2017-10-22 ENCOUNTER — Encounter: Payer: Medicare Other | Admitting: *Deleted

## 2017-10-22 ENCOUNTER — Telehealth: Payer: Self-pay | Admitting: Cardiology

## 2017-10-22 NOTE — Telephone Encounter (Signed)
LMOVM reminding pt to send remote transmission.   

## 2017-10-26 ENCOUNTER — Encounter: Payer: Self-pay | Admitting: Internal Medicine

## 2017-11-16 ENCOUNTER — Encounter: Payer: Self-pay | Admitting: Internal Medicine

## 2017-11-16 ENCOUNTER — Ambulatory Visit (INDEPENDENT_AMBULATORY_CARE_PROVIDER_SITE_OTHER): Payer: Medicare Other | Admitting: Internal Medicine

## 2017-11-16 VITALS — BP 120/70 | HR 66 | Ht 63.0 in | Wt 109.4 lb

## 2017-11-16 DIAGNOSIS — Z950 Presence of cardiac pacemaker: Secondary | ICD-10-CM

## 2017-11-16 DIAGNOSIS — I442 Atrioventricular block, complete: Secondary | ICD-10-CM | POA: Diagnosis not present

## 2017-11-16 LAB — CUP PACEART INCLINIC DEVICE CHECK
Battery Remaining Longevity: 66 mo
Brady Statistic AS VS Percent: 0 %
Implantable Lead Implant Date: 19970117
Implantable Lead Location: 753859
Implantable Pulse Generator Implant Date: 20130827
Lead Channel Pacing Threshold Amplitude: 1.25 V
Lead Channel Pacing Threshold Amplitude: 1.75 V
Lead Channel Pacing Threshold Pulse Width: 0.4 ms
Lead Channel Pacing Threshold Pulse Width: 0.4 ms
Lead Channel Sensing Intrinsic Amplitude: 2.8 mV
Lead Channel Setting Pacing Amplitude: 3.5 V
Lead Channel Setting Pacing Pulse Width: 0.4 ms
MDC IDC LEAD IMPLANT DT: 19970117
MDC IDC LEAD LOCATION: 753860
MDC IDC MSMT BATTERY IMPEDANCE: 566 Ohm
MDC IDC MSMT BATTERY VOLTAGE: 2.77 V
MDC IDC MSMT LEADCHNL RA IMPEDANCE VALUE: 760 Ohm
MDC IDC MSMT LEADCHNL RA PACING THRESHOLD AMPLITUDE: 2.5 V
MDC IDC MSMT LEADCHNL RA PACING THRESHOLD PULSEWIDTH: 0.4 ms
MDC IDC MSMT LEADCHNL RA PACING THRESHOLD PULSEWIDTH: 1.25 ms
MDC IDC MSMT LEADCHNL RV IMPEDANCE VALUE: 884 Ohm
MDC IDC MSMT LEADCHNL RV PACING THRESHOLD AMPLITUDE: 1.5 V
MDC IDC SESS DTM: 20181228174051
MDC IDC SET LEADCHNL RA PACING AMPLITUDE: 2.5 V
MDC IDC SET LEADCHNL RV SENSING SENSITIVITY: 5.6 mV
MDC IDC STAT BRADY AP VP PERCENT: 79 %
MDC IDC STAT BRADY AP VS PERCENT: 0 %
MDC IDC STAT BRADY AS VP PERCENT: 21 %

## 2017-11-16 NOTE — Patient Instructions (Signed)
Medication Instructions: Your physician recommends that you continue on your current medications as directed. Please refer to the Current Medication list given to you today.  Labwork: None Ordered  Procedures/Testing: None Ordered  Follow-Up: Your physician wants you to follow-up in: 1 YEAR with Dr. Klein. You will receive a reminder letter in the mail two months in advance. If you don't receive a letter, please call our office to schedule the follow-up appointment.  Remote monitoring is used to monitor your Pacemaker from home. This monitoring reduces the number of office visits required to check your device to one time per year. It allows us to keep an eye on the functioning of your device to ensure it is working properly. You are scheduled for a device check from home on 02/18/18. You may send your transmission at any time that day. If you have a wireless device, the transmission will be sent automatically. After your physician reviews your transmission, you will receive a postcard with your next transmission date.   If you need a refill on your cardiac medications before your next appointment, please call your pharmacy.   

## 2017-11-16 NOTE — Progress Notes (Signed)
Patient Care Team: Biagio Borg, MD as PCP - General Jovita Kussmaul, MD as Consulting Physician (General Surgery) Magrinat, Virgie Dad, MD as Consulting Physician (Oncology) Ladene Artist, MD as Consulting Physician (Gastroenterology) Deboraha Sprang, MD as Consulting Physician (Cardiology) Kennon Holter, NP as Nurse Practitioner (Nurse Practitioner) Gareth Morgan, MD as Referring Physician (Orthopedic Surgery)   HPI  Danielle [name] is a 81 y.o. female is seen in followup for complete heart block for which she is status post pacemaker.  She underwent device generator replacement 8/13     The patient denies chest pain, shortness of breath, nocturnal dyspnea, orthopnea or peripheral edema.  There have been no palpitations, lightheadedness or syncope.   She has been diagnosed with breast cancer. She is grateful for the blessings learned    Past Medical History:  Diagnosis Date  . ALLERGIC RHINITIS 01/09/2008  . ANEMIA-NOS 01/09/2008  . ANXIETY 01/09/2008  . ASTHMATIC BRONCHITIS, ACUTE 10/20/2008  . AV BLOCK, COMPLETE 12/16/2009  . DEPRESSION 01/09/2008  . Dizziness and giddiness 02/25/2010  . DVT, HX OF 01/09/2008  . Genetic testing 06/04/2017   Danielle [name] underwent genetic counseling and testing for hereditary cancer syndromes on 05/24/2017. Her results were negative for mutations in all 46 genes analyzed by [facility]'s 46-gene Common Hereditary Cancers Panel. Genes analyzed include: APC, ATM, AXIN2, BARD1, BMPR1A, BRCA1, BRCA2, BRIP1, CDH1, CDKN2A, CHEK2, CTNNA1, DICER1, EPCAM, GREM1, HOXB13, KIT, MEN1, MLH1, MSH2, MSH3, MSH6, MUTYH, NBN  . GERD 01/09/2008  . GLUCOSE INTOLERANCE 01/09/2008  . History of kidney stones   . HYPERLIPIDEMIA 01/09/2008  . HYPERTENSION 01/09/2008  . HYPOTHYROIDISM 01/09/2008  . Impaired glucose tolerance 07/28/2011  . INSOMNIA-SLEEP DISORDER-UNSPEC 06/15/2009  . NEPHROLITHIASIS, HX OF 01/09/2008  . OSTEOARTHRITIS, CERVICAL SPINE  02/25/2010  . Presence of permanent cardiac pacemaker    medtronic    Past Surgical History:  Procedure Laterality Date  . BREAST BIOPSY Right 09/20/2016   malignant  . ERCP W/ SPHICTEROTOMY    . HEMORRHOID SURGERY    . MASTECTOMY Right 01/11/2017  . MASTECTOMY W/ SENTINEL NODE BIOPSY Right 01/11/2017   Procedure: RIGHT MASTECTOMY WITH SENTINEL LYMPH NODE BIOPSY;  Surgeon: [name] Klein, MD;  Location: [facility];  Service: General;  Laterality: Right;  . PACEMAKER PLACEMENT     MedTronic EnPulse [id number]  . PERMANENT PACEMAKER GENERATOR CHANGE N/A 07/16/2012   Procedure: PERMANENT PACEMAKER GENERATOR CHANGE;  Surgeon: Deboraha Sprang, MD;  Location: [facility] CATH LAB;  Service: Cardiovascular;  Laterality: N/A;  . POLYPECTOMY     Uterine    Current Outpatient Medications  Medication Sig Dispense Refill  . atenolol (TENORMIN) 50 MG tablet Take 1 tablet (50 mg total) by mouth daily. **PATIENT NEEDS YEARLY APPOINTMENT FOR REFILLS** 90 tablet 0  . atorvastatin (LIPITOR) 10 MG tablet Take 1 tablet (10 mg total) by mouth daily. 90 tablet 3  . azelastine (ASTELIN) 0.1 % nasal spray Place 1 spray into both nostrils 2 (two) times daily. Use in each nostril as directed 30 mL 11  . Cholecalciferol (VITAMIN D) 2000 units CAPS Take 10,000 Units by mouth daily.    . enalapril (VASOTEC) 10 MG tablet Take 1 tablet (10 mg total) by mouth daily. **PATIENT NEEDS YEARLY APPOINTMENT FOR REFILLS** 90 tablet 0  . levothyroxine (SYNTHROID, LEVOTHROID) 100 MCG tablet take 1 tablet by mouth once daily 90 tablet 2  . pantoprazole (PROTONIX) 40 MG tablet Take 40 mg by mouth as needed.  0  . Vitamin D, Ergocalciferol, (DRISDOL) 50000 units CAPS capsule Take 50,000 Units by mouth once a week.  0  . zolpidem (AMBIEN CR) 12.5 MG CR tablet take 1 tablet by mouth at bedtime if needed 30 tablet 5   No current facility-administered medications for this visit.     Allergies  Allergen Reactions  . Codeine Nausea And Vomiting  .  Oxycodone Nausea And Vomiting    Review of Systems negative except from HPI and PMH  Physical Exam BP 120/70   Pulse 66   Ht 5' 3"  (1.6 m)   Wt 109 lb 6.4 oz (49.6 kg)   SpO2 96%   BMI 19.38 kg/m  Well developed and nourished in no acute distress HENT normal Neck supple with JVP-flat Device pocket well healed; without hematoma or erythema.  There is no tethering  Clear Regular rate and rhythm, no murmurs or gallops Abd-soft with active BS without hepatomegaly No Clubbing cyanosis edema Skin-warm and dry A & Oriented  Grossly normal sensory and motor function   .ECG personally reviewed   .AV pacing  Assessment and  Plan  Complete heart block   Pacemaker  Medtronic    Atrial lead threshold increase   Overall doing very well    We have reprogrammed atrial output to 2.5 @ 1.25 with 2 fold safety margin

## 2017-11-28 DIAGNOSIS — M25551 Pain in right hip: Secondary | ICD-10-CM | POA: Diagnosis not present

## 2017-11-28 DIAGNOSIS — M1611 Unilateral primary osteoarthritis, right hip: Secondary | ICD-10-CM | POA: Diagnosis not present

## 2017-11-30 ENCOUNTER — Other Ambulatory Visit: Payer: Self-pay | Admitting: Internal Medicine

## 2017-11-30 DIAGNOSIS — M25551 Pain in right hip: Secondary | ICD-10-CM | POA: Diagnosis not present

## 2017-12-04 DIAGNOSIS — M25551 Pain in right hip: Secondary | ICD-10-CM | POA: Diagnosis not present

## 2017-12-11 DIAGNOSIS — M25551 Pain in right hip: Secondary | ICD-10-CM | POA: Diagnosis not present

## 2017-12-14 DIAGNOSIS — M25551 Pain in right hip: Secondary | ICD-10-CM | POA: Diagnosis not present

## 2017-12-21 DIAGNOSIS — M25551 Pain in right hip: Secondary | ICD-10-CM | POA: Diagnosis not present

## 2017-12-25 DIAGNOSIS — M25551 Pain in right hip: Secondary | ICD-10-CM | POA: Diagnosis not present

## 2017-12-28 DIAGNOSIS — M25551 Pain in right hip: Secondary | ICD-10-CM | POA: Diagnosis not present

## 2018-01-07 ENCOUNTER — Other Ambulatory Visit: Payer: Self-pay | Admitting: Family Medicine

## 2018-01-07 NOTE — Telephone Encounter (Signed)
Refill denied. Pt hasn't been seen in over a year.  

## 2018-01-08 DIAGNOSIS — M25551 Pain in right hip: Secondary | ICD-10-CM | POA: Diagnosis not present

## 2018-01-11 DIAGNOSIS — M25551 Pain in right hip: Secondary | ICD-10-CM | POA: Diagnosis not present

## 2018-01-14 DIAGNOSIS — M25551 Pain in right hip: Secondary | ICD-10-CM | POA: Diagnosis not present

## 2018-01-15 ENCOUNTER — Inpatient Hospital Stay: Payer: Medicare Other | Attending: Oncology

## 2018-01-15 DIAGNOSIS — Z853 Personal history of malignant neoplasm of breast: Secondary | ICD-10-CM | POA: Insufficient documentation

## 2018-01-15 DIAGNOSIS — Z171 Estrogen receptor negative status [ER-]: Secondary | ICD-10-CM

## 2018-01-15 DIAGNOSIS — C50811 Malignant neoplasm of overlapping sites of right female breast: Secondary | ICD-10-CM

## 2018-01-15 LAB — COMPREHENSIVE METABOLIC PANEL
ALBUMIN: 3.8 g/dL (ref 3.5–5.0)
ALK PHOS: 78 U/L (ref 40–150)
ALT: 19 U/L (ref 0–55)
ANION GAP: 10 (ref 3–11)
AST: 21 U/L (ref 5–34)
BUN: 22 mg/dL (ref 7–26)
CALCIUM: 9.6 mg/dL (ref 8.4–10.4)
CO2: 26 mmol/L (ref 22–29)
Chloride: 105 mmol/L (ref 98–109)
Creatinine, Ser: 1.19 mg/dL — ABNORMAL HIGH (ref 0.60–1.10)
GFR calc Af Amer: 49 mL/min — ABNORMAL LOW (ref >60)
GFR calc non Af Amer: 42 mL/min — ABNORMAL LOW (ref >60)
GLUCOSE: 98 mg/dL (ref 70–140)
POTASSIUM: 4.5 mmol/L (ref 3.5–5.1)
SODIUM: 141 mmol/L (ref 136–145)
Total Bilirubin: 0.9 mg/dL (ref 0.2–1.2)
Total Protein: 6.4 g/dL (ref 6.4–8.3)

## 2018-01-15 LAB — CBC WITH DIFFERENTIAL/PLATELET
BASOS ABS: 0 10*3/uL (ref 0.0–0.1)
BASOS PCT: 0 %
Eosinophils Absolute: 0.1 10*3/uL (ref 0.0–0.5)
Eosinophils Relative: 3 %
HEMATOCRIT: 35.9 % (ref 34.8–46.6)
HEMOGLOBIN: 11.8 g/dL (ref 11.6–15.9)
LYMPHS PCT: 34 %
Lymphs Abs: 1.6 10*3/uL (ref 0.9–3.3)
MCH: 31.5 pg (ref 25.1–34.0)
MCHC: 32.9 g/dL (ref 31.5–36.0)
MCV: 95.7 fL (ref 79.5–101.0)
MONOS PCT: 9 %
Monocytes Absolute: 0.4 10*3/uL (ref 0.1–0.9)
NEUTROS ABS: 2.5 10*3/uL (ref 1.5–6.5)
NEUTROS PCT: 54 %
Platelets: 160 10*3/uL (ref 145–400)
RBC: 3.75 MIL/uL (ref 3.70–5.45)
RDW: 13.2 % (ref 11.2–14.5)
WBC: 4.7 10*3/uL (ref 3.9–10.3)

## 2018-01-21 NOTE — Progress Notes (Signed)
[place]  [phone] Fax:([phone]     ID: Danielle [name] DOB: 1936-10-12  MR#: [id number]  YSA#:[id number]  Patient Care Team: Biagio Borg, MD as PCP - General Jovita Kussmaul, MD as Consulting Physician (General Surgery) Magrinat, Virgie Dad, MD as Consulting Physician (Oncology) Ladene Artist, MD as Consulting Physician (Gastroenterology) Deboraha Sprang, MD as Consulting Physician (Cardiology) Kennon Holter, NP as Nurse Practitioner (Nurse Practitioner) Gareth Morgan, MD as Referring Physician (Orthopedic Surgery) Soijett A Blue OTHER MD:  CHIEF COMPLAINT: Triple negative breast cancer   CURRENT TREATMENT: Observation   BREAST CANCER HISTORY: From the original intake note:  Danielle [name] herself noted a change in her right breast sometime in September 2017. She brought this to medical attention and on 09/19/2016 had diagnostic bilateral mammography with tomography and [facility]. The breast density was category D. There was an irregular mass in the upper central breast. This was spiculated and measured at least 2 cm. There were also numerous linear calcifications in the upper outer quadrant of the right breast spanning a minimum of 2.0 cm. On physical exam there was a rounded mobile mass in the upper inner quadrant of the right breast. Ultrasound of the right breast confirmed an irregular hypoechoic mass at the 12:30 o'clock position 5 cm from the nipple measuring 1.5 cm, with a 0.7 cm satellite nodule.  On 09/20/2016 the patient underwent biopsy of the right breast 2. Biopsy #1 described as upper outer quadrant showed high-grade ductal carcinoma in situ estrogen and progesterone receptor negative. Right breast biopsy #2 described as 12:30 o'clock showed invasive ductal carcinoma, estrogen and progesterone receptor negative, HER-2 not amplified with an MIB-1 of 1.24  and the number per cell 1.80 , and the MIB-1  was 80%.  The patient met with my partner Dr. Sonny Dandy January 2018. He recommended Taxotere and Cytoxan chemotherapy for 4 cycles after her surgery.  On 01/11/2017 the patient underwent right mastectomy with sentinel lymph node biopsy. The final pathology here ([id number]) showed in the right breast, invasive ductal carcinoma, grade 3, measuring 2.3 cm with negative margins. All 8 lymph nodes examined, including 3 sentinel lymph nodes, were clear.  The patient declined a follow-up appointment with Dr. Payton Mccallum.She was scheduled to see me 03/12/2017 but did not show.  Her subsequent history is as detailed below  INTERVAL HISTORY: "Danielle [name]" returns today for follow-up of her triple negative breast cancer. She reports that she is doing well overall.   She is due for a routine screening mammogram in August 2019.  REVIEW OF SYSTEMS: Danielle [name] reports that for exercise, she is interested in [place]. She notes that she is still dealing with arthritis and she is going through physical therapy. She had a hip transplant recommended to her and she notes that she is able to walk better on soft ground versus hard ground. She is apprehensive due to the side effects following hip replacement. She has tried tramadol, tylenol, aleve, and now ASA. She denies unusual headaches, visual changes, nausea, vomiting, or dizziness. There has been no unusual cough, phlegm production, or pleurisy. This been no change in bowel or bladder habits. She denies unexplained fatigue or unexplained weight loss, bleeding, rash, or fever. A detailed review of systems was otherwise stable.   PAST MEDICAL HISTORY: Past Medical History:  Diagnosis Date   ALLERGIC RHINITIS 01/09/2008   ANEMIA-NOS 01/09/2008   ANXIETY 01/09/2008  ASTHMATIC BRONCHITIS, ACUTE 10/20/2008   AV BLOCK, COMPLETE 12/16/2009   DEPRESSION 01/09/2008   Dizziness and giddiness 02/25/2010   DVT, HX OF 01/09/2008    Genetic testing 06/04/2017   Ms. Kopecky underwent genetic counseling and testing for hereditary cancer syndromes on 05/24/2017. Her results were negative for mutations in all 46 genes analyzed by [facility]'s 46-gene Common Hereditary Cancers Panel. Genes analyzed include: APC, ATM, AXIN2, BARD1, BMPR1A, BRCA1, BRCA2, BRIP1, CDH1, CDKN2A, CHEK2, CTNNA1, DICER1, EPCAM, GREM1, HOXB13, KIT, MEN1, MLH1, MSH2, MSH3, MSH6, MUTYH, NBN   GERD 01/09/2008   GLUCOSE INTOLERANCE 01/09/2008   History of kidney stones    HYPERLIPIDEMIA 01/09/2008   HYPERTENSION 01/09/2008   HYPOTHYROIDISM 01/09/2008   Impaired glucose tolerance 07/28/2011   INSOMNIA-SLEEP DISORDER-UNSPEC 06/15/2009   NEPHROLITHIASIS, HX OF 01/09/2008   OSTEOARTHRITIS, CERVICAL SPINE 02/25/2010   Presence of permanent cardiac pacemaker    medtronic    PAST SURGICAL HISTORY: Past Surgical History:  Procedure Laterality Date   BREAST BIOPSY Right 09/20/2016   malignant   ERCP W/ SPHICTEROTOMY     HEMORRHOID SURGERY     MASTECTOMY Right 01/11/2017   MASTECTOMY W/ SENTINEL NODE BIOPSY Right 01/11/2017   Procedure: RIGHT MASTECTOMY WITH SENTINEL LYMPH NODE BIOPSY;  Surgeon: [name] Klein, MD;  Location: [facility];  Service: General;  Laterality: Right;   PACEMAKER PLACEMENT     MedTronic EnPulse [id number]   PERMANENT PACEMAKER GENERATOR CHANGE N/A 07/16/2012   Procedure: PERMANENT PACEMAKER GENERATOR CHANGE;  Surgeon: Deboraha Sprang, MD;  Location: [facility] CATH LAB;  Service: Cardiovascular;  Laterality: N/A;   POLYPECTOMY     Uterine    FAMILY HISTORY Family History  Problem Relation Age of Onset   Sudden death Mother        d.39s   Breast cancer Mother 64   Pulmonary embolism Father        Possible   Lung cancer Paternal Grandfather        history of smoking   Breast cancer Maternal Aunt        d.78s  The patient's father died in his 15s from a "clot to the heart". The patient's mother was diagnosed with breast cancer in  her early 38s and died from metastatic breast cancer approximately 30 years later. A maternal aunt (her mother's only sister) was also diagnosed with breast cancer in her early 71s according to the patient. The patient had 2 brothers, 3 sisters. There is no other history of breast or ovarian cancer in the family to her knowledge  GYNECOLOGIC HISTORY:  No LMP recorded. Patient is postmenopausal. She can't remember age at menarche or time of menopause. She is GX P5, including one premature birth. 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 Her husband Jeneen Rinks ("gin") is a Psychologist, sport and exercise. 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 The patient's son [name] lives in [place] where he works as an Arboriculturist. Daughter [name] lives in [place] and is a Scientist, research (life sciences). Daughter [name] lives in [place] and is an Training and development officer as well as a third Land. Son [name] Klein was born premature and did not survive. Daughter Luellen Pucker lives in [place] where she is a Art gallery manager with a fine arts degree. The patient has 4 grandchildren. She is a Nurse, learning disability.    ADVANCED DIRECTIVES: In place   HEALTH MAINTENANCE: Social History   Tobacco  Use   Smoking status: Former Smoker    Packs/day: 0.20    Years: 25.00    Pack years: 5.00    Types: Cigarettes    Last attempt to quit: 11/21/1971    Years since quitting: 46.2   Smokeless tobacco: Never Used  Substance Use Topics   Alcohol use: Yes    Comment: wine   Drug use: No     Colonoscopy: Due  PAP:  Bone density:   Allergies  Allergen Reactions   Codeine Nausea And Vomiting   Oxycodone Nausea And Vomiting    Current Outpatient Medications  Medication Sig Dispense Refill   atenolol (TENORMIN) 50 MG tablet Take 1 tablet (50 mg total) by mouth daily. **PATIENT NEEDS YEARLY APPOINTMENT FOR REFILLS** 90 tablet 0   atorvastatin (LIPITOR) 10  MG tablet take 1 tablet by mouth once daily 90 tablet 0   azelastine (ASTELIN) 0.1 % nasal spray Place 1 spray into both nostrils 2 (two) times daily. Use in each nostril as directed 30 mL 11   Cholecalciferol (VITAMIN D) 2000 units CAPS Take 10,000 Units by mouth daily.     enalapril (VASOTEC) 10 MG tablet Take 1 tablet (10 mg total) by mouth daily. **PATIENT NEEDS YEARLY APPOINTMENT FOR REFILLS** 90 tablet 0   levothyroxine (SYNTHROID, LEVOTHROID) 100 MCG tablet take 1 tablet by mouth once daily 90 tablet 2   pantoprazole (PROTONIX) 40 MG tablet Take 40 mg by mouth as needed.  0   Vitamin D, Ergocalciferol, (DRISDOL) 50000 units CAPS capsule Take 50,000 Units by mouth once a week.  0   zolpidem (AMBIEN CR) 12.5 MG CR tablet take 1 tablet by mouth at bedtime if needed 30 tablet 5   No current facility-administered medications for this visit.     OBJECTIVE: Late middle-aged white woman in no acute distress  Vitals:   01/22/18 1255  BP: 122/64  Pulse: 75  Resp: 18  Temp: 97.6 F (36.4 C)  SpO2: 100%     Body mass index is 19.79 kg/m.    ECOG FS:1 - Symptomatic but completely ambulatory  Sclerae unicteric, pupils round and equal Oropharynx clear and moist No cervical or supraclavicular adenopathy Lungs no rales or rhonchi Heart regular rate and rhythm Abd soft, nontender, positive bowel sounds MSK no focal spinal tenderness, no upper extremity lymphedema Neuro: nonfocal, well oriented, appropriate affect Breasts: The right breast has undergone mastectomy.  There is no evidence of chest wall recurrence.  The left breast is benign.  Both axillae are benign.   LAB RESULTS:  CMP     Component Value Date/Time   NA 141 01/15/2018 1301   NA 140 07/25/2017 1347   K 4.5 01/15/2018 1301   K 4.3 07/25/2017 1347   CL 105 01/15/2018 1301   CO2 26 01/15/2018 1301   CO2 26 07/25/2017 1347   GLUCOSE 98 01/15/2018 1301   GLUCOSE 77 07/25/2017 1347   BUN 22 01/15/2018 1301   BUN  27.4 (H) 07/25/2017 1347   CREATININE 1.19 (H) 01/15/2018 1301   CREATININE 1.1 07/25/2017 1347   CALCIUM 9.6 01/15/2018 1301   CALCIUM 9.8 07/25/2017 1347   PROT 6.4 01/15/2018 1301   PROT 6.6 07/25/2017 1347   ALBUMIN 3.8 01/15/2018 1301   ALBUMIN 4.0 07/25/2017 1347   AST 21 01/15/2018 1301   AST 29 07/25/2017 1347   ALT 19 01/15/2018 1301   ALT 28 07/25/2017 1347   ALKPHOS 78 01/15/2018 1301   ALKPHOS 75 07/25/2017 1347  BILITOT 0.9 01/15/2018 1301   BILITOT 1.21 (H) 07/25/2017 1347   GFRNONAA 42 (L) 01/15/2018 1301   GFRNONAA 57 (L) 07/15/2012 1714   GFRAA 49 (L) 01/15/2018 1301   GFRAA 66 07/15/2012 1714    No results found for: Ronnald Ramp, A1GS, A2GS, BETS, BETA2SER, GAMS, MSPIKE, SPEI  No results found for: Nils Pyle, [facility]  Lab Results  Component Value Date   WBC 4.7 01/15/2018   NEUTROABS 2.5 01/15/2018   HGB 11.8 01/15/2018   HCT 35.9 01/15/2018   MCV 95.7 01/15/2018   PLT 160 01/15/2018      Chemistry      Component Value Date/Time   NA 141 01/15/2018 1301   NA 140 07/25/2017 1347   K 4.5 01/15/2018 1301   K 4.3 07/25/2017 1347   CL 105 01/15/2018 1301   CO2 26 01/15/2018 1301   CO2 26 07/25/2017 1347   BUN 22 01/15/2018 1301   BUN 27.4 (H) 07/25/2017 1347   CREATININE 1.19 (H) 01/15/2018 1301   CREATININE 1.1 07/25/2017 1347      Component Value Date/Time   CALCIUM 9.6 01/15/2018 1301   CALCIUM 9.8 07/25/2017 1347   ALKPHOS 78 01/15/2018 1301   ALKPHOS 75 07/25/2017 1347   AST 21 01/15/2018 1301   AST 29 07/25/2017 1347   ALT 19 01/15/2018 1301   ALT 28 07/25/2017 1347   BILITOT 0.9 01/15/2018 1301   BILITOT 1.21 (H) 07/25/2017 1347       No results found for: LABCA2  No components found for: [id number]  No results for input(s): INR in the last 168 hours.  Urinalysis    Component Value Date/Time   COLORURINE YELLOW 02/22/2017 1653   APPEARANCEUR CLEAR 02/22/2017 1653   LABSPEC 1.025  02/22/2017 1653   PHURINE 6.0 02/22/2017 1653   GLUCOSEU NEGATIVE 02/22/2017 1653   HGBUR NEGATIVE 02/22/2017 1653   BILIRUBINUR SMALL (A) 02/22/2017 1653   KETONESUR NEGATIVE 02/22/2017 1653   PROTEINUR NEGATIVE 01/08/2017 1355   UROBILINOGEN 0.2 02/22/2017 1653   NITRITE NEGATIVE 02/22/2017 1653   LEUKOCYTESUR TRACE (A) 02/22/2017 1653     STUDIES: No results found.   ELIGIBLE FOR AVAILABLE RESEARCH PROTOCOL: no  ASSESSMENT: 82 y.o. [place] woman woman status post right breast overlapping site biopsies 09/20/2016 showing (a) invasive ductal carcinoma, estrogen and progesterone receptor negative, with an MIB-1 of 80%, and no HER-2 amplification. (b) ductal carcinoma in situ, high-grade, estrogen and progesterone receptor negative  (1) status post right mastectomy with sentinel lymph node and axillary lymph node sampling 01/11/2017 for a pT2 pN0, stage IIB invasive ductal carcinoma, grade 3, with negative margins  (2) genetics testing 05/24/2017 through [facility]'s Common Hereditary Cancers Panel found no deleterious mutations in APC, ATM, AXIN2, BARD1, BMPR1A, BRCA1, BRCA2, BRIP1, CDH1, CDKN2A, CHEK2, CTNNA1, DICER1, EPCAM, GREM1, HOXB13, KIT, MEN1, MLH1, MSH2, MSH3, MSH6, MUTYH, NBN, NF1, NTHL1, PALB2, PDGFRA, PMS2, POLD1, POLE, PTEN, RAD50, RAD51C, RAD51D, SDHA, SDHB, SDHC, SDHD, SMAD4, SMARCA4, STK11, TP53, TSC1, TSC2, and VHL.  (3) the patient opted against adjuvant chemotherapy  PLAN: Danielle [name] is now a little over a year out from definitive surgery for her breast cancer with no evidence of disease recurrence.  This is favorable.  She is benefiting from physical therapy.  [facility] but she does not like to [facility] indoors.  She is more interested in participating in tai chi which we have available here.  Also of course her daughter is a yoga  instructor  She will have her next mammogram in August.  She will return to see me in September.  The plan  is to start seeing her on a yearly basis after that visit  She knows to call for any other issues that may develop before the next visit here.   Magrinat, Virgie Dad, MD  01/22/18 1:16 PM Medical Oncology and Hematology [facility] [address] [place], [place] [address] Tel. [phone]    Fax. [phone]    This document serves as a record of services personally performed by Lurline Del, MD. It was created on his behalf by Steva Colder, a trained medical scribe. The creation of this record is based on the scribe's personal observations and the provider's statements to them.   I have reviewed the above documentation for accuracy and completeness, and I agree with the above.

## 2018-01-22 ENCOUNTER — Inpatient Hospital Stay: Payer: Medicare Other | Attending: Oncology | Admitting: Oncology

## 2018-01-22 VITALS — BP 122/64 | HR 75 | Temp 97.6°F | Resp 18 | Ht 63.0 in | Wt 111.7 lb

## 2018-01-22 DIAGNOSIS — C50811 Malignant neoplasm of overlapping sites of right female breast: Secondary | ICD-10-CM | POA: Insufficient documentation

## 2018-01-22 DIAGNOSIS — Z171 Estrogen receptor negative status [ER-]: Secondary | ICD-10-CM | POA: Diagnosis not present

## 2018-02-18 ENCOUNTER — Encounter: Payer: Self-pay | Admitting: Cardiology

## 2018-02-18 ENCOUNTER — Encounter: Payer: Medicare Other | Admitting: *Deleted

## 2018-02-18 ENCOUNTER — Telehealth: Payer: Self-pay | Admitting: Cardiology

## 2018-02-18 NOTE — Telephone Encounter (Signed)
Attempted to confirm remote transmission with pt. No answer and was unable to leave a message.   

## 2018-02-19 ENCOUNTER — Other Ambulatory Visit: Payer: Self-pay | Admitting: Internal Medicine

## 2018-02-21 ENCOUNTER — Encounter: Payer: Self-pay | Admitting: Cardiology

## 2018-02-25 ENCOUNTER — Ambulatory Visit (INDEPENDENT_AMBULATORY_CARE_PROVIDER_SITE_OTHER): Payer: Medicare Other | Admitting: *Deleted

## 2018-02-25 DIAGNOSIS — I442 Atrioventricular block, complete: Secondary | ICD-10-CM

## 2018-02-26 NOTE — Progress Notes (Signed)
Remote pacemaker transmission.   

## 2018-02-28 ENCOUNTER — Encounter: Payer: Self-pay | Admitting: Cardiology

## 2018-03-01 ENCOUNTER — Encounter: Payer: Self-pay | Admitting: Internal Medicine

## 2018-03-01 ENCOUNTER — Other Ambulatory Visit (INDEPENDENT_AMBULATORY_CARE_PROVIDER_SITE_OTHER): Payer: Medicare Other

## 2018-03-01 ENCOUNTER — Other Ambulatory Visit: Payer: Self-pay | Admitting: Internal Medicine

## 2018-03-01 ENCOUNTER — Ambulatory Visit (INDEPENDENT_AMBULATORY_CARE_PROVIDER_SITE_OTHER): Payer: Medicare Other | Admitting: Internal Medicine

## 2018-03-01 VITALS — BP 136/84 | HR 93 | Temp 98.3°F | Ht 63.0 in | Wt 113.0 lb

## 2018-03-01 DIAGNOSIS — I10 Essential (primary) hypertension: Secondary | ICD-10-CM

## 2018-03-01 DIAGNOSIS — E039 Hypothyroidism, unspecified: Secondary | ICD-10-CM | POA: Diagnosis not present

## 2018-03-01 DIAGNOSIS — E785 Hyperlipidemia, unspecified: Secondary | ICD-10-CM

## 2018-03-01 DIAGNOSIS — M25551 Pain in right hip: Secondary | ICD-10-CM | POA: Diagnosis not present

## 2018-03-01 DIAGNOSIS — R7302 Impaired glucose tolerance (oral): Secondary | ICD-10-CM | POA: Diagnosis not present

## 2018-03-01 LAB — HEPATIC FUNCTION PANEL
ALBUMIN: 4 g/dL (ref 3.5–5.2)
ALK PHOS: 69 U/L (ref 39–117)
ALT: 10 U/L (ref 0–35)
AST: 16 U/L (ref 0–37)
Bilirubin, Direct: 0.1 mg/dL (ref 0.0–0.3)
TOTAL PROTEIN: 6.4 g/dL (ref 6.0–8.3)
Total Bilirubin: 0.8 mg/dL (ref 0.2–1.2)

## 2018-03-01 LAB — CBC WITH DIFFERENTIAL/PLATELET
BASOS ABS: 0.1 10*3/uL (ref 0.0–0.1)
Basophils Relative: 1 % (ref 0.0–3.0)
EOS ABS: 0.2 10*3/uL (ref 0.0–0.7)
Eosinophils Relative: 3.8 % (ref 0.0–5.0)
HEMATOCRIT: 33.1 % — AB (ref 36.0–46.0)
HEMOGLOBIN: 11.2 g/dL — AB (ref 12.0–15.0)
LYMPHS PCT: 27.8 % (ref 12.0–46.0)
Lymphs Abs: 1.5 10*3/uL (ref 0.7–4.0)
MCHC: 33.7 g/dL (ref 30.0–36.0)
MCV: 93.8 fl (ref 78.0–100.0)
MONOS PCT: 8.5 % (ref 3.0–12.0)
Monocytes Absolute: 0.5 10*3/uL (ref 0.1–1.0)
NEUTROS ABS: 3.1 10*3/uL (ref 1.4–7.7)
Neutrophils Relative %: 58.9 % (ref 43.0–77.0)
Platelets: 157 10*3/uL (ref 150.0–400.0)
RBC: 3.53 Mil/uL — AB (ref 3.87–5.11)
RDW: 13.7 % (ref 11.5–15.5)
WBC: 5.3 10*3/uL (ref 4.0–10.5)

## 2018-03-01 LAB — LIPID PANEL
CHOLESTEROL: 191 mg/dL (ref 0–200)
HDL: 64.8 mg/dL (ref >39.00)
LDL Cholesterol: 105 mg/dL — ABNORMAL HIGH (ref 0–99)
NonHDL: 126.01
TRIGLYCERIDES: 103 mg/dL (ref 0.0–149.0)
Total CHOL/HDL Ratio: 3
VLDL: 20.6 mg/dL (ref 0.0–40.0)

## 2018-03-01 LAB — URINALYSIS, ROUTINE W REFLEX MICROSCOPIC
Bilirubin Urine: NEGATIVE
Hgb urine dipstick: NEGATIVE
Ketones, ur: NEGATIVE
Leukocytes, UA: NEGATIVE
Nitrite: NEGATIVE
Specific Gravity, Urine: 1.025 (ref 1.000–1.030)
Total Protein, Urine: NEGATIVE
Urine Glucose: NEGATIVE
Urobilinogen, UA: 0.2 (ref 0.0–1.0)
pH: 5.5 (ref 5.0–8.0)

## 2018-03-01 LAB — BASIC METABOLIC PANEL
BUN: 29 mg/dL — ABNORMAL HIGH (ref 6–23)
CALCIUM: 9.3 mg/dL (ref 8.4–10.5)
CHLORIDE: 108 meq/L (ref 96–112)
CO2: 28 mEq/L (ref 19–32)
Creatinine, Ser: 1.06 mg/dL (ref 0.40–1.20)
GFR: 52.98 mL/min — AB (ref >60.00)
Glucose, Bld: 83 mg/dL (ref 70–99)
Potassium: 3.9 mEq/L (ref 3.5–5.1)
Sodium: 143 mEq/L (ref 135–145)

## 2018-03-01 LAB — HEMOGLOBIN A1C: Hgb A1c MFr Bld: 5.8 % (ref 4.6–6.5)

## 2018-03-01 LAB — TSH: TSH: 9.62 u[IU]/mL — AB (ref 0.35–4.50)

## 2018-03-01 MED ORDER — LEVOTHYROXINE SODIUM 112 MCG PO TABS: 112.0000 ug | ORAL_TABLET | Freq: Every day | ORAL | 3 refills | Status: DC

## 2018-03-01 MED ORDER — TRAMADOL HCL ER 200 MG PO TB24: 200.0000 mg | ORAL_TABLET | Freq: Every day | ORAL | 0 refills | Status: DC

## 2018-03-01 MED ORDER — TRAMADOL HCL (ER BIPHASIC) 200 MG PO CP24: ORAL_CAPSULE | ORAL | 5 refills | Status: DC

## 2018-03-01 NOTE — Progress Notes (Signed)
Subjective:    Patient ID: ELLICE [name], female    DOB: 1936-05-30, 82 y.o.   MRN: [id number]  HPI  Here for yearly f/u;  Overall doing ok;  Pt denies Chest pain, worsening SOB, DOE, wheezing, orthopnea, PND, worsening LE edema, palpitations, dizziness or syncope.  Pt denies neurological change such as new headache, facial or extremity weakness.  Pt denies polydipsia, polyuria, or low sugar symptoms. Pt states overall good compliance with treatment and medications, good tolerability, and has been trying to follow appropriate diet.  Pt denies worsening depressive symptoms, suicidal ideation or panic. No fever, night sweats, wt loss, loss of appetite, or other constitutional symptoms.  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 Declines dxa for now.  Has seen ortho in past and she thinks surgury suggested but she has been putting off.  Denies hyper or hypo thyroid symptoms such as voice, skin or hair change. Past Medical History:  Diagnosis Date  . ALLERGIC RHINITIS 01/09/2008  . ANEMIA-NOS 01/09/2008  . ANXIETY 01/09/2008  . ASTHMATIC BRONCHITIS, ACUTE 10/20/2008  . AV BLOCK, COMPLETE 12/16/2009  . DEPRESSION 01/09/2008  . Dizziness and giddiness 02/25/2010  . DVT, HX OF 01/09/2008  . Genetic testing 06/04/2017   Danielle [name] underwent genetic counseling and testing for hereditary cancer syndromes on 05/24/2017. Her results were negative for mutations in all 46 genes analyzed by [facility]'s 46-gene Common Hereditary Cancers Panel. Genes analyzed include: APC, ATM, AXIN2, BARD1, BMPR1A, BRCA1, BRCA2, BRIP1, CDH1, CDKN2A, CHEK2, CTNNA1, DICER1, EPCAM, GREM1, HOXB13, KIT, MEN1, MLH1, MSH2, MSH3, MSH6, MUTYH, NBN  .  GERD 01/09/2008  . GLUCOSE INTOLERANCE 01/09/2008  . History of kidney stones   . HYPERLIPIDEMIA 01/09/2008  . HYPERTENSION 01/09/2008  . HYPOTHYROIDISM 01/09/2008  . Impaired glucose tolerance 07/28/2011  . INSOMNIA-SLEEP DISORDER-UNSPEC 06/15/2009  . NEPHROLITHIASIS, HX OF 01/09/2008  . OSTEOARTHRITIS, CERVICAL SPINE 02/25/2010  . Presence of permanent cardiac pacemaker    medtronic   Past Surgical History:  Procedure Laterality Date  . BREAST BIOPSY Right 09/20/2016   malignant  . ERCP W/ SPHICTEROTOMY    . HEMORRHOID SURGERY    . MASTECTOMY Right 01/11/2017  . MASTECTOMY W/ SENTINEL NODE BIOPSY Right 01/11/2017   Procedure: RIGHT MASTECTOMY WITH SENTINEL LYMPH NODE BIOPSY;  Surgeon: [name] Klein, MD;  Location: [facility];  Service: General;  Laterality: Right;  . PACEMAKER PLACEMENT     MedTronic EnPulse [id number]  . PERMANENT PACEMAKER GENERATOR CHANGE N/A 07/16/2012   Procedure: PERMANENT PACEMAKER GENERATOR CHANGE;  Surgeon: Deboraha Sprang, MD;  Location: [facility] CATH LAB;  Service: Cardiovascular;  Laterality: N/A;  . POLYPECTOMY     Uterine    reports that she quit smoking about 46 years ago. Her smoking use included cigarettes. She has a 5.00 pack-year smoking history. She has never used smokeless tobacco. She reports that she drinks alcohol. She reports that she does not use drugs. family history includes Breast cancer in her maternal aunt; Breast cancer (age of onset: 32) in her mother; Lung cancer in her paternal grandfather; Pulmonary embolism in her father; Sudden death in her mother. Allergies  Allergen Reactions  . Codeine Nausea And Vomiting  . Oxycodone Nausea And  Vomiting   Current Outpatient Medications on File Prior to Visit  Medication Sig Dispense Refill  . atenolol (TENORMIN) 50 MG tablet Take 1 tablet (50 mg total) by mouth daily. **PATIENT NEEDS YEARLY APPOINTMENT FOR REFILLS** 90 tablet 0  . atorvastatin (LIPITOR) 10 MG tablet take 1 tablet by mouth once daily 90 tablet  0  . azelastine (ASTELIN) 0.1 % nasal spray Place 1 spray into both nostrils 2 (two) times daily. Use in each nostril as directed 30 mL 11  . Cholecalciferol (VITAMIN D) 2000 units CAPS Take 10,000 Units by mouth daily.    . enalapril (VASOTEC) 10 MG tablet Take 1 tablet (10 mg total) by mouth daily. **PATIENT NEEDS YEARLY APPOINTMENT FOR REFILLS** 90 tablet 0  . pantoprazole (PROTONIX) 40 MG tablet Take 40 mg by mouth as needed.  0  . zolpidem (AMBIEN CR) 12.5 MG CR tablet take 1 tablet by mouth at bedtime if needed 30 tablet 5   No current facility-administered medications on file prior to visit.    Review of Systems Constitutional: Negative for other unusual diaphoresis, sweats, appetite or weight changes HENT: Negative for other worsening hearing loss, ear pain, facial swelling, mouth sores or neck stiffness.   Eyes: Negative for other worsening pain, redness or other visual disturbance.  Respiratory: Negative for other stridor or swelling Cardiovascular: Negative for other palpitations or other chest pain  Gastrointestinal: Negative for worsening diarrhea or loose stools, blood in stool, distention or other pain Genitourinary: Negative for hematuria, flank pain or other change in urine volume.  Musculoskeletal: Negative for myalgias or other joint swelling.  Skin: Negative for other color change, or other wound or worsening drainage.  Neurological: Negative for other syncope or numbness. Hematological: Negative for other adenopathy or swelling Psychiatric/Behavioral: Negative for hallucinations, other worsening agitation, SI, self-injury, or new decreased concentration All other system neg per pt    Objective:   Physical Exam BP 136/84   Pulse 93   Temp 98.3 F (36.8 C) (Oral)   Ht _0  (1.6 m)   Wt 113 lb (51.3 kg)   SpO2 98%   BMI 20.02 kg/m  VS noted,  Constitutional: Pt is oriented to person, place, and time. Appears well-developed and well-nourished, in no significant  distress and comfortable Head: Normocephalic and atraumatic  Eyes: Conjunctivae and EOM are normal. Pupils are equal, round, and reactive to light Right Ear: External ear normal without discharge Left Ear: External ear normal without discharge Nose: Nose without discharge or deformity Mouth/Throat: Oropharynx is without other ulcerations and moist  Neck: Normal range of motion. Neck supple. No JVD present. No tracheal deviation present or significant neck LA or mass Cardiovascular: Normal rate, regular rhythm, normal heart sounds and intact distal pulses.   Pulmonary/Chest: WOB normal and breath sounds without rales or wheezing  Abdominal: Soft. Bowel sounds are normal. NT. No HSM  Musculoskeletal: Normal range of motion. Exhibits no edema except for right hip marked reduced ROM and pain on leg elevation Lymphadenopathy: Has no other cervical adenopathy.  Neurological: Pt is alert and oriented to person, place, and time. Pt has normal reflexes. No cranial nerve deficit. Motor grossly intact, Gait intact Skin: Skin is warm and dry. No rash noted or new ulcerations Psychiatric:  Has normal mood and affect. Behavior is normal without agitation No other exam findings    Assessment & Plan:

## 2018-03-01 NOTE — Patient Instructions (Addendum)
Please take all new medication as prescribed - the tramadol for pain  Please continue all other medications as before, and refills have been done if requested.  Please have the pharmacy call with any other refills you may need.  Please continue your efforts at being more active, low cholesterol diet, and weight control.  You are otherwise up to date with prevention measures today.  Please keep your appointments with your specialists as you may have planned  Please go to the LAB in the Basement (turn left off the elevator) for the tests to be done today  You will be contacted by phone if any changes need to be made immediately.  Otherwise, you will receive a letter about your results with an explanation, but please check with MyChart first.  Please remember to sign up for MyChart if you have not done so, as this will be important to you in the future with finding out test results, communicating by private email, and scheduling acute appointments online when needed.  Please return in 6 months, or sooner if needed (for pain med refill)

## 2018-03-02 NOTE — Assessment & Plan Note (Signed)
stable overall by history and exam, recent data reviewed with pt, and pt to continue medical treatment as before,  to f/u any worsening symptoms or concerns Lab Results  Component Value Date   LDLCALC 105 (H) 03/01/2018

## 2018-03-02 NOTE — Assessment & Plan Note (Signed)
stable overall by history and exam, recent data reviewed with pt, and pt to continue medical treatment as before,  to f/u any worsening symptoms or concerns BP Readings from Last 3 Encounters:  03/01/18 136/84  01/22/18 122/64  11/16/17 120/70

## 2018-03-02 NOTE — Assessment & Plan Note (Signed)
Lab Results  Component Value Date   HGBA1C 5.8 03/01/2018  stable overall by history and exam, recent data reviewed with pt, and pt to continue medical treatment as before,  to f/u any worsening symptoms or concerns

## 2018-03-02 NOTE — Assessment & Plan Note (Signed)
stable overall by history and exam, recent data reviewed with pt, and pt to continue medical treatment as before,  to f/u any worsening symptoms or concerns. For f/u lab today 

## 2018-03-02 NOTE — Assessment & Plan Note (Addendum)
Worsening recently, for tramadol prn,  to f/u any worsening symptoms or concerns, encouraged pt to fu with ortho

## 2018-03-04 ENCOUNTER — Telehealth: Payer: Self-pay

## 2018-03-04 NOTE — Telephone Encounter (Signed)
-----   Message from Biagio Borg, MD sent at 03/01/2018  5:01 PM EDT ----- Left message on MyChart, pt to cont same tx except  The test results show that your current treatment is OK, except the thyroid test shows the thyroid to be mildly slow.  We should increase the thyroid medication to 112 mcg per day.  I will send a new prescription and you should hear from the office as well.    Danielle [name] to please inform pt, I will do rx

## 2018-03-04 NOTE — Telephone Encounter (Signed)
Called pt, VM is not set up to receive msgs.

## 2018-03-07 ENCOUNTER — Other Ambulatory Visit: Payer: Self-pay

## 2018-03-07 ENCOUNTER — Telehealth: Payer: Self-pay | Admitting: Internal Medicine

## 2018-03-07 MED ORDER — ATENOLOL 50 MG PO TABS: 50.0000 mg | ORAL_TABLET | Freq: Every day | ORAL | 0 refills | Status: DC

## 2018-03-07 MED ORDER — ATORVASTATIN CALCIUM 10 MG PO TABS: 10.0000 mg | ORAL_TABLET | Freq: Every day | ORAL | 0 refills | Status: DC

## 2018-03-07 MED ORDER — ENALAPRIL MALEATE 10 MG PO TABS: 10.0000 mg | ORAL_TABLET | Freq: Every day | ORAL | 0 refills | Status: DC

## 2018-03-07 NOTE — Telephone Encounter (Signed)
Copied from [place] [id number]. Topic: Quick Communication - Rx Refill/Question >> Mar 07, 2018 12:17 PM Sallee Provencal, [place] B, NT wrote: Medication: atenolol (TENORMIN) 50 MG tablet,  atorvastatin (LIPITOR) 10 MG tablet  &    enalapril (VASOTEC) 10 MG tablet -- Patient completely out x 4 days Has the patient contacted their pharmacy? Yes.   (Agent: If no, request that the patient contact the pharmacy for the refill.) Preferred Pharmacy (with phone number or street name): [facility] [place], [place] AT [place]: Please be advised that RX refills may take up to 3 business days. We ask that you follow-up with your pharmacy.

## 2018-03-18 LAB — CUP PACEART REMOTE DEVICE CHECK
Battery Impedance: 640 Ohm
Brady Statistic AP VS Percent: 0 %
Brady Statistic AS VP Percent: 24 %
Brady Statistic AS VS Percent: 0 %
Date Time Interrogation Session: 20190408193642
Implantable Lead Implant Date: 19970117
Implantable Lead Implant Date: 19970117
Implantable Lead Location: 753859
Implantable Lead Location: 753860
Implantable Lead Model: 4068
Lead Channel Impedance Value: 813 Ohm
Lead Channel Pacing Threshold Amplitude: 2 V
Lead Channel Pacing Threshold Amplitude: 2.5 V
Lead Channel Pacing Threshold Pulse Width: 0.4 ms
Lead Channel Pacing Threshold Pulse Width: 0.4 ms
MDC IDC MSMT BATTERY REMAINING LONGEVITY: 49 mo
MDC IDC MSMT BATTERY VOLTAGE: 2.77 V
MDC IDC MSMT LEADCHNL RA IMPEDANCE VALUE: 755 Ohm
MDC IDC PG IMPLANT DT: 20130827
MDC IDC SET LEADCHNL RA PACING AMPLITUDE: 2.5 V
MDC IDC SET LEADCHNL RV PACING AMPLITUDE: 4 V
MDC IDC SET LEADCHNL RV PACING PULSEWIDTH: 0.4 ms
MDC IDC SET LEADCHNL RV SENSING SENSITIVITY: 5.6 mV
MDC IDC STAT BRADY AP VP PERCENT: 76 %

## 2018-05-05 ENCOUNTER — Other Ambulatory Visit: Payer: Self-pay | Admitting: Internal Medicine

## 2018-05-06 ENCOUNTER — Other Ambulatory Visit: Payer: Self-pay

## 2018-05-06 MED ORDER — PANTOPRAZOLE SODIUM 40 MG PO TBEC: 40.0000 mg | DELAYED_RELEASE_TABLET | ORAL | 0 refills | Status: DC | PRN

## 2018-05-27 ENCOUNTER — Ambulatory Visit (INDEPENDENT_AMBULATORY_CARE_PROVIDER_SITE_OTHER): Payer: Medicare Other | Admitting: *Deleted

## 2018-05-27 ENCOUNTER — Telehealth: Payer: Self-pay | Admitting: Cardiology

## 2018-05-27 DIAGNOSIS — I442 Atrioventricular block, complete: Secondary | ICD-10-CM

## 2018-05-27 DIAGNOSIS — J3081 Allergic rhinitis due to animal (cat) (dog) hair and dander: Secondary | ICD-10-CM | POA: Diagnosis not present

## 2018-05-27 DIAGNOSIS — J3089 Other allergic rhinitis: Secondary | ICD-10-CM | POA: Diagnosis not present

## 2018-05-27 NOTE — Telephone Encounter (Signed)
LMOVM reminding pt to send remote transmission.   

## 2018-05-29 ENCOUNTER — Encounter: Payer: Self-pay | Admitting: Cardiology

## 2018-05-30 ENCOUNTER — Encounter: Payer: Self-pay | Admitting: Cardiology

## 2018-05-30 NOTE — Progress Notes (Signed)
Remote pacemaker transmission.   

## 2018-06-12 LAB — CUP PACEART REMOTE DEVICE CHECK
Battery Impedance: 767 Ohm
Battery Remaining Longevity: 46 mo
Battery Voltage: 2.77 V
Brady Statistic AP VP Percent: 73 %
Brady Statistic AS VP Percent: 27 %
Implantable Lead Location: 753859
Implantable Lead Model: 4068
Implantable Lead Model: 4068
Implantable Pulse Generator Implant Date: 20130827
Lead Channel Impedance Value: 767 Ohm
Lead Channel Pacing Threshold Amplitude: 2.5 V
Lead Channel Pacing Threshold Pulse Width: 0.4 ms
Lead Channel Setting Pacing Amplitude: 2.5 V
Lead Channel Setting Pacing Pulse Width: 0.4 ms
MDC IDC LEAD IMPLANT DT: 19970117
MDC IDC LEAD IMPLANT DT: 19970117
MDC IDC LEAD LOCATION: 753860
MDC IDC MSMT LEADCHNL RV IMPEDANCE VALUE: 863 Ohm
MDC IDC MSMT LEADCHNL RV PACING THRESHOLD AMPLITUDE: 1.875 V
MDC IDC MSMT LEADCHNL RV PACING THRESHOLD PULSEWIDTH: 0.4 ms
MDC IDC SESS DTM: 20190711193405
MDC IDC SET LEADCHNL RV PACING AMPLITUDE: 3.75 V
MDC IDC SET LEADCHNL RV SENSING SENSITIVITY: 5.6 mV
MDC IDC STAT BRADY AP VS PERCENT: 0 %
MDC IDC STAT BRADY AS VS PERCENT: 0 %

## 2018-07-08 ENCOUNTER — Ambulatory Visit
Admission: RE | Admit: 2018-07-08 | Discharge: 2018-07-08 | Disposition: A | Discharge status: Home / Self Care | Payer: Medicare Other | Source: Ambulatory Visit | Attending: Oncology | Admitting: Oncology

## 2018-07-08 DIAGNOSIS — Z171 Estrogen receptor negative status [ER-]: Principal | ICD-10-CM

## 2018-07-08 DIAGNOSIS — Z1231 Encounter for screening mammogram for malignant neoplasm of breast: Secondary | ICD-10-CM | POA: Diagnosis not present

## 2018-07-08 DIAGNOSIS — C50811 Malignant neoplasm of overlapping sites of right female breast: Secondary | ICD-10-CM

## 2018-07-23 NOTE — Progress Notes (Signed)
[place]  Telephone:([phone] Fax:([phone]) [phone]     ID: [name] DOB: September 11, 1936  MR#: [id number]  RJJ#:[id number]  Patient Care Team: Biagio Borg, MD as PCP - General Jovita Kussmaul, MD as Consulting Physician (General Surgery) Magrinat, Virgie Dad, MD as Consulting Physician (Oncology) Ladene Artist, MD as Consulting Physician (Gastroenterology) Deboraha Sprang, MD as Consulting Physician (Cardiology) Kennon Holter, NP as Nurse Practitioner (Nurse Practitioner) Gareth Morgan, MD as Referring Physician (Orthopedic Surgery) OTHER MD:  CHIEF COMPLAINT: Triple negative breast cancer   CURRENT TREATMENT: Observation   BREAST CANCER HISTORY: From the original intake note:  Danielle [name] herself noted a change in her right breast sometime in September 2017. She brought this to medical attention and on 09/19/2016 had diagnostic bilateral mammography with tomography [facility] The breast density was category D. There was an irregular mass in the upper central breast. This was spiculated and measured at least 2 cm. There were also numerous linear calcifications in the upper outer quadrant of the right breast spanning a minimum of 2.0 cm. On physical exam there was a rounded mobile mass in the upper inner quadrant of the right breast. Ultrasound of the right breast confirmed an irregular hypoechoic mass at the 12:30 o'clock position 5 cm from the nipple measuring 1.5 cm, with a 0.7 cm satellite nodule.  On 09/20/2016 the patient underwent biopsy of the right breast 2. Biopsy #1 described as upper outer quadrant showed high-grade ductal carcinoma in situ estrogen and progesterone receptor negative. Right breast biopsy #2 described as 12:30 o'clock showed invasive ductal carcinoma, estrogen and progesterone receptor negative, HER-2 not amplified with an MIB-1 of 1.24 and the number  per cell 1.80 , and the MIB-1  was 80%.  The patient met with my partner Dr. Sonny Dandy January 2018. He recommended Taxotere and Cytoxan chemotherapy for 4 cycles after her surgery.  On 01/11/2017 the patient underwent right mastectomy with sentinel lymph node biopsy. The final pathology here ([id number]) showed in the right breast, invasive ductal carcinoma, grade 3, measuring 2.3 cm with negative margins. All 8 lymph nodes examined, including 3 sentinel lymph nodes, were clear.  The patient declined a follow-up appointment with Dr. Payton Mccallum.She was scheduled to see me 03/12/2017 but did not show.  Her subsequent history is as detailed below  INTERVAL HISTORY: "Danielle [name]" returns today for follow-up of her triple negative breast cancer.  Since her lats visit here, she had routine sreening left mammography with tomography at [facility] on 07/04/2018 showing: breast density category C. There was no evidence of malignancy.   REVIEW OF SYSTEMS: Danielle [name] is reading "Cakes and Ale" by [employer] about a Midwife. She says does not want to know how old she is, and she would like to pretend that her age is going backwards. For exercise, she goes to physical therapy, and at home she does ROM exercises. She has arthritic pain and foot pain. Walking is easier if she walks in fields verses on concrete. She denies unusual headaches, visual changes, nausea, vomiting, or dizziness. There has been no unusual cough, phlegm production, or pleurisy. There has been no change in bowel or bladder habits. She denies unexplained fatigue or unexplained weight loss, bleeding, rash, or fever. A detailed review of systems was otherwise stable.    PAST MEDICAL HISTORY: Past Medical History:  Diagnosis Date  . ALLERGIC RHINITIS 01/09/2008  . ANEMIA-NOS 01/09/2008  .  ANXIETY 01/09/2008  . ASTHMATIC BRONCHITIS, ACUTE 10/20/2008  . AV BLOCK, COMPLETE 12/16/2009  . DEPRESSION 01/09/2008  . Dizziness and giddiness 02/25/2010  . DVT,  HX OF 01/09/2008  . Genetic testing 06/04/2017   Ms. Raether underwent genetic counseling and testing for hereditary cancer syndromes on 05/24/2017. Her results were negative for mutations in all 46 genes analyzed by [facility]'s 46-gene Common Hereditary Cancers Panel. Genes analyzed include: APC, ATM, AXIN2, BARD1, BMPR1A, BRCA1, BRCA2, BRIP1, CDH1, CDKN2A, CHEK2, CTNNA1, DICER1, EPCAM, GREM1, HOXB13, KIT, MEN1, MLH1, MSH2, MSH3, MSH6, MUTYH, NBN  . GERD 01/09/2008  . GLUCOSE INTOLERANCE 01/09/2008  . History of kidney stones   . HYPERLIPIDEMIA 01/09/2008  . HYPERTENSION 01/09/2008  . HYPOTHYROIDISM 01/09/2008  . Impaired glucose tolerance 07/28/2011  . INSOMNIA-SLEEP DISORDER-UNSPEC 06/15/2009  . NEPHROLITHIASIS, HX OF 01/09/2008  . OSTEOARTHRITIS, CERVICAL SPINE 02/25/2010  . Presence of permanent cardiac pacemaker    medtronic    PAST SURGICAL HISTORY: Past Surgical History:  Procedure Laterality Date  . BREAST BIOPSY Right 09/20/2016   malignant  . ERCP W/ SPHICTEROTOMY    . HEMORRHOID SURGERY    . MASTECTOMY Right 01/11/2017  . MASTECTOMY W/ SENTINEL NODE BIOPSY Right 01/11/2017   Procedure: RIGHT MASTECTOMY WITH SENTINEL LYMPH NODE BIOPSY;  Surgeon: [name] Klein, MD;  Location: [facility];  Service: General;  Laterality: Right;  . PACEMAKER PLACEMENT     MedTronic EnPulse [id number]  . PERMANENT PACEMAKER GENERATOR CHANGE N/A 07/16/2012   Procedure: PERMANENT PACEMAKER GENERATOR CHANGE;  Surgeon: Deboraha Sprang, MD;  Location[facility] CATH LAB;  Service: Cardiovascular;  Laterality: N/A;  . POLYPECTOMY     Uterine    FAMILY HISTORY Family History  Problem Relation Age of Onset  . Sudden death Mother        d.60s  . Breast cancer Mother 94  . Pulmonary embolism Father        Possible  . Lung cancer Paternal Grandfather        history of smoking  . Breast cancer Maternal Aunt        d.78s  The patient's father died in his 55s from a "clot to the heart". The patient's mother was diagnosed  with breast cancer in her early 49s and died from metastatic breast cancer approximately 30 years later. A maternal aunt (her mother's only sister) was also diagnosed with breast cancer in her early 56s according to the patient. The patient had 2 brothers, 3 sisters. There is no other history of breast or ovarian cancer in the family to her knowledge  GYNECOLOGIC HISTORY:  No LMP recorded. Patient is postmenopausal. She can't remember age at menarche or time of menopause. She is GX P5, including one premature birth. 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 Her husband Jonne Ply") is a Psychologist, sport and exercise. 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 The patient's son Myles Rosenthal lives in [place] where he works as an Arboriculturist. Daughter Clyde Canterbury lives in [place] and is a Scientist, research (life sciences). Daughter [name] lives in [place] and is an Training and development officer as well as a third Land. Son [name] Klein was born premature and did not survive. Daughter Luellen Pucker lives in [place] where she is a Art gallery manager with a fine arts degree. The patient has 4 grandchildren. She is a Nurse, learning disability.    ADVANCED DIRECTIVES: In place   HEALTH MAINTENANCE:  Social History   Tobacco Use  . Smoking status: Former Smoker    Packs/day: 0.20    Years: 25.00    Pack years: 5.00    Types: Cigarettes    Last attempt to quit: 11/21/1971    Years since quitting: 46.7  . Smokeless tobacco: Never Used  Substance Use Topics  . Alcohol use: Yes    Comment: wine  . Drug use: No     Colonoscopy: Due  PAP:  Bone density:   Allergies  Allergen Reactions  . Codeine Nausea And Vomiting  . Oxycodone Nausea And Vomiting    Current Outpatient Medications  Medication Sig Dispense Refill  . atenolol (TENORMIN) 50 MG tablet Take 1 tablet (50 mg total) by mouth daily. **PATIENT NEEDS YEARLY APPOINTMENT FOR REFILLS** 90 tablet 0  .  atorvastatin (LIPITOR) 10 MG tablet Take 1 tablet (10 mg total) by mouth daily. 90 tablet 0  . azelastine (ASTELIN) 0.1 % nasal spray Place 1 spray into both nostrils 2 (two) times daily. Use in each nostril as directed 30 mL 11  . Cholecalciferol (VITAMIN D) 2000 units CAPS Take 10,000 Units by mouth daily.    . enalapril (VASOTEC) 10 MG tablet Take 1 tablet (10 mg total) by mouth daily. **PATIENT NEEDS YEARLY APPOINTMENT FOR REFILLS** 90 tablet 0  . levothyroxine (SYNTHROID, LEVOTHROID) 112 MCG tablet Take 1 tablet (112 mcg total) by mouth daily. 90 tablet 3  . pantoprazole (PROTONIX) 40 MG tablet Take 1 tablet (40 mg total) by mouth as needed. 90 tablet 0  . traMADol (ULTRAM-ER) 200 MG 24 hr tablet Take 1 tablet (200 mg total) by mouth daily. 7 tablet 0  . TraMADol HCl 200 MG CP24 1 tab by mouth daily as needed for mod pain 30 capsule 5  . zolpidem (AMBIEN CR) 12.5 MG CR tablet take 1 tablet by mouth at bedtime if needed 30 tablet 5   No current facility-administered medications for this visit.     OBJECTIVE: Older white woman who appears well  Vitals:   07/24/18 1322  BP: (!) 146/69  Pulse: 74  Resp: 18  Temp: 98.3 F (36.8 C)  SpO2: 100%     Body mass index is 19.31 kg/m.    ECOG FS:0 - Asymptomatic  Sclerae unicteric, pupils round and equal Lungs no rales or rhonchi Heart regular rate and rhythm Abd soft, nontender, positive bowel sounds MSK no focal spinal tenderness, no upper extremity lymphedema Neuro: nonfocal, well oriented, appropriate affect Breasts: The right breast has undergone mastectomy.  There is no evidence of local recurrence.  Left breast is unremarkable.  Both axillae are benign.  LAB RESULTS:  CMP     Component Value Date/Time   NA 143 03/01/2018 1441   NA 140 07/25/2017 1347   K 3.9 03/01/2018 1441   K 4.3 07/25/2017 1347   CL 108 03/01/2018 1441   CO2 28 03/01/2018 1441   CO2 26 07/25/2017 1347   GLUCOSE 83 03/01/2018 1441   GLUCOSE 77  07/25/2017 1347   BUN 29 (H) 03/01/2018 1441   BUN 27.4 (H) 07/25/2017 1347   CREATININE 1.06 03/01/2018 1441   CREATININE 1.1 07/25/2017 1347   CALCIUM 9.3 03/01/2018 1441   CALCIUM 9.8 07/25/2017 1347   PROT 6.4 03/01/2018 1441   PROT 6.6 07/25/2017 1347   ALBUMIN 4.0 03/01/2018 1441   ALBUMIN 4.0 07/25/2017 1347   AST 16 03/01/2018 1441   AST 29 07/25/2017 1347   ALT 10 03/01/2018 1441  ALT 28 07/25/2017 1347   ALKPHOS 69 03/01/2018 1441   ALKPHOS 75 07/25/2017 1347   BILITOT 0.8 03/01/2018 1441   BILITOT 1.21 (H) 07/25/2017 1347   GFRNONAA 42 (L) 01/15/2018 1301   GFRNONAA 57 (L) 07/15/2012 1714   GFRAA 49 (L) 01/15/2018 1301   GFRAA 66 07/15/2012 1714    No results found for: TOTALPROTELP, ALBUMINELP, A1GS, A2GS, BETS, BETA2SER, GAMS, MSPIKE, SPEI  No results found for: Nils Pyle, [facility]  Lab Results  Component Value Date   WBC 5.3 03/01/2018   NEUTROABS 3.1 03/01/2018   HGB 11.2 (L) 03/01/2018   HCT 33.1 (L) 03/01/2018   MCV 93.8 03/01/2018   PLT 157.0 03/01/2018      Chemistry      Component Value Date/Time   NA 143 03/01/2018 1441   NA 140 07/25/2017 1347   K 3.9 03/01/2018 1441   K 4.3 07/25/2017 1347   CL 108 03/01/2018 1441   CO2 28 03/01/2018 1441   CO2 26 07/25/2017 1347   BUN 29 (H) 03/01/2018 1441   BUN 27.4 (H) 07/25/2017 1347   CREATININE 1.06 03/01/2018 1441   CREATININE 1.1 07/25/2017 1347      Component Value Date/Time   CALCIUM 9.3 03/01/2018 1441   CALCIUM 9.8 07/25/2017 1347   ALKPHOS 69 03/01/2018 1441   ALKPHOS 75 07/25/2017 1347   AST 16 03/01/2018 1441   AST 29 07/25/2017 1347   ALT 10 03/01/2018 1441   ALT 28 07/25/2017 1347   BILITOT 0.8 03/01/2018 1441   BILITOT 1.21 (H) 07/25/2017 1347       No results found for: LABCA2  No components found for: [id number]  No results for input(s): INR in the last 168 hours.  Urinalysis    Component Value Date/Time   COLORURINE YELLOW 03/01/2018 1441    APPEARANCEUR CLEAR 03/01/2018 1441   LABSPEC 1.025 03/01/2018 1441   PHURINE 5.5 03/01/2018 1441   GLUCOSEU NEGATIVE 03/01/2018 1441   HGBUR NEGATIVE 03/01/2018 1441   BILIRUBINUR NEGATIVE 03/01/2018 1441   KETONESUR NEGATIVE 03/01/2018 1441   PROTEINUR NEGATIVE 01/08/2017 1355   UROBILINOGEN 0.2 03/01/2018 1441   NITRITE NEGATIVE 03/01/2018 1441   LEUKOCYTESUR NEGATIVE 03/01/2018 1441     STUDIES: Mm 3d Screen Breast Uni Left  Result Date: 07/08/2018 CLINICAL DATA:  Screening. EXAM: DIGITAL SCREENING UNILATERAL LEFT MAMMOGRAM WITH CAD AND TOMO COMPARISON:  Previous exam(s). ACR Breast Density Category c: The breast tissue is heterogeneously dense, which may obscure small masses. FINDINGS: The patient has had a right mastectomy. There are no findings suspicious for malignancy. Images were processed with CAD. IMPRESSION: No mammographic evidence of malignancy. A result letter of this screening mammogram will be mailed directly to the patient. RECOMMENDATION: Screening mammogram in one year.  (Code:[id number]-L-84M) BI-RADS CATEGORY  1: Negative. Electronically Signed   By: Claudie Revering M.D.   On: 07/08/2018 16:32    ELIGIBLE FOR AVAILABLE RESEARCH PROTOCOL: no  ASSESSMENT: 82 y.o. [place] woman woman status post right breast overlapping site biopsies 09/20/2016 showing (a) invasive ductal carcinoma, estrogen and progesterone receptor negative, with an MIB-1 of 80%, and no HER-2 amplification. (b) ductal carcinoma in situ, high-grade, estrogen and progesterone receptor negative  (1) status post right mastectomy with sentinel lymph node and axillary lymph node sampling 01/11/2017 for a pT2 pN0, stage IIB invasive ductal carcinoma, grade 3, with negative margins  (2) genetics testing 05/24/2017 through [facility]'s Common Hereditary Cancers Panel found no deleterious mutations in APC, ATM, AXIN2, BARD1, BMPR1A,  BRCA1, BRCA2, BRIP1, CDH1, CDKN2A, CHEK2, CTNNA1, DICER1, EPCAM, GREM1, HOXB13, KIT, MEN1,  MLH1, MSH2, MSH3, MSH6, MUTYH, NBN, NF1, NTHL1, PALB2, PDGFRA, PMS2, POLD1, POLE, PTEN, RAD50, RAD51C, RAD51D, SDHA, SDHB, SDHC, SDHD, SMAD4, SMARCA4, STK11, TP53, TSC1, TSC2, and VHL.  (3) the patient opted against adjuvant chemotherapy  PLAN: Danielle [name] is now a year and a half out from definitive surgery for her breast cancer with no evidence of disease recurrence.  This is very favorable.  We reviewed her recent mammogram which is fine.  She still has slightly dense breasts so we are going to continue to do the "3D" tomography  I suggested she try our tai chi program.  She was enthusiastic  She will return to see me in 1 year.  She knows to call for any other issues that may develop before the next visit.   Magrinat, Virgie Dad, MD  07/24/18 1:33 PM Medical Oncology and Hematology [facility] [address] [place], [place] [address] Tel. [phone]    Fax. [phone]  Alice Rieger, am acting as scribe for Chauncey Cruel MD.  I, Lurline Del MD, have reviewed the above documentation for accuracy and completeness, and I agree with the above.

## 2018-07-24 ENCOUNTER — Telehealth: Payer: Self-pay | Admitting: Oncology

## 2018-07-24 ENCOUNTER — Inpatient Hospital Stay: Payer: Medicare Other | Attending: Oncology | Admitting: Oncology

## 2018-07-24 VITALS — BP 146/69 | HR 74 | Temp 98.3°F | Resp 18 | Ht 63.0 in | Wt 109.0 lb

## 2018-07-24 DIAGNOSIS — Z171 Estrogen receptor negative status [ER-]: Secondary | ICD-10-CM

## 2018-07-24 DIAGNOSIS — C50811 Malignant neoplasm of overlapping sites of right female breast: Secondary | ICD-10-CM | POA: Diagnosis not present

## 2018-07-24 DIAGNOSIS — Z87891 Personal history of nicotine dependence: Secondary | ICD-10-CM | POA: Insufficient documentation

## 2018-07-24 NOTE — Telephone Encounter (Signed)
Gave pt avs and calendar  °

## 2018-08-15 ENCOUNTER — Ambulatory Visit: Payer: Medicare Other | Admitting: Internal Medicine

## 2018-08-15 DIAGNOSIS — Z0289 Encounter for other administrative examinations: Secondary | ICD-10-CM

## 2018-08-17 ENCOUNTER — Other Ambulatory Visit: Payer: Self-pay | Admitting: Internal Medicine

## 2018-08-19 ENCOUNTER — Emergency Department (HOSPITAL_COMMUNITY)
Admission: EM | Admit: 2018-08-19 | Discharge: 2018-08-19 | Disposition: A | Discharge status: Home / Self Care | Payer: Medicare Other | Attending: Emergency Medicine | Admitting: Emergency Medicine

## 2018-08-19 ENCOUNTER — Emergency Department (HOSPITAL_COMMUNITY): Payer: Medicare Other

## 2018-08-19 ENCOUNTER — Encounter (HOSPITAL_COMMUNITY): Payer: Self-pay | Admitting: Emergency Medicine

## 2018-08-19 DIAGNOSIS — E039 Hypothyroidism, unspecified: Secondary | ICD-10-CM | POA: Insufficient documentation

## 2018-08-19 DIAGNOSIS — R1010 Upper abdominal pain, unspecified: Secondary | ICD-10-CM | POA: Diagnosis present

## 2018-08-19 DIAGNOSIS — R112 Nausea with vomiting, unspecified: Secondary | ICD-10-CM | POA: Insufficient documentation

## 2018-08-19 DIAGNOSIS — I10 Essential (primary) hypertension: Secondary | ICD-10-CM | POA: Insufficient documentation

## 2018-08-19 DIAGNOSIS — N200 Calculus of kidney: Secondary | ICD-10-CM | POA: Diagnosis not present

## 2018-08-19 DIAGNOSIS — Z79899 Other long term (current) drug therapy: Secondary | ICD-10-CM | POA: Diagnosis not present

## 2018-08-19 DIAGNOSIS — E860 Dehydration: Secondary | ICD-10-CM | POA: Diagnosis not present

## 2018-08-19 DIAGNOSIS — Z87891 Personal history of nicotine dependence: Secondary | ICD-10-CM | POA: Insufficient documentation

## 2018-08-19 LAB — CBC
HEMATOCRIT: 30.9 % — AB (ref 36.0–46.0)
HEMOGLOBIN: 10 g/dL — AB (ref 12.0–15.0)
MCH: 31.1 pg (ref 26.0–34.0)
MCHC: 32.4 g/dL (ref 30.0–36.0)
MCV: 96 fL (ref 78.0–100.0)
Platelets: 138 10*3/uL — ABNORMAL LOW (ref 150–400)
RBC: 3.22 MIL/uL — ABNORMAL LOW (ref 3.87–5.11)
RDW: 13.3 % (ref 11.5–15.5)
WBC: 5.5 10*3/uL (ref 4.0–10.5)

## 2018-08-19 LAB — COMPREHENSIVE METABOLIC PANEL
ALT: 20 U/L (ref 0–44)
ANION GAP: 9 (ref 5–15)
AST: 26 U/L (ref 15–41)
Albumin: 4.2 g/dL (ref 3.5–5.0)
Alkaline Phosphatase: 63 U/L (ref 38–126)
BUN: 24 mg/dL — ABNORMAL HIGH (ref 8–23)
CHLORIDE: 109 mmol/L (ref 98–111)
CO2: 24 mmol/L (ref 22–32)
Calcium: 9.6 mg/dL (ref 8.9–10.3)
Creatinine, Ser: 0.97 mg/dL (ref 0.44–1.00)
GFR calc Af Amer: >60 mL/min (ref >60)
GFR calc non Af Amer: 54 mL/min — ABNORMAL LOW (ref >60)
Glucose, Bld: 118 mg/dL — ABNORMAL HIGH (ref 70–99)
POTASSIUM: 4 mmol/L (ref 3.5–5.1)
Sodium: 142 mmol/L (ref 135–145)
Total Bilirubin: 1.4 mg/dL — ABNORMAL HIGH (ref 0.3–1.2)
Total Protein: 6.3 g/dL — ABNORMAL LOW (ref 6.5–8.1)

## 2018-08-19 LAB — URINALYSIS, ROUTINE W REFLEX MICROSCOPIC
Bilirubin Urine: NEGATIVE
GLUCOSE, UA: NEGATIVE mg/dL
Hgb urine dipstick: NEGATIVE
Ketones, ur: 5 mg/dL — AB
LEUKOCYTES UA: NEGATIVE
NITRITE: NEGATIVE
PH: 7 (ref 5.0–8.0)
Protein, ur: NEGATIVE mg/dL
SPECIFIC GRAVITY, URINE: 1.014 (ref 1.005–1.030)

## 2018-08-19 LAB — LIPASE, BLOOD: LIPASE: 27 U/L (ref 11–51)

## 2018-08-19 MED ORDER — ONDANSETRON HCL 4 MG/2ML IJ SOLN: 4.0000 mg | Freq: Once | INTRAMUSCULAR | Status: DC

## 2018-08-19 MED ORDER — IOHEXOL 300 MG/ML  SOLN
100.0000 mL | Freq: Once | INTRAMUSCULAR | Status: AC | PRN
  Administered 2018-08-19: 100 mL via INTRAVENOUS

## 2018-08-19 MED ORDER — SODIUM CHLORIDE 0.9 % IV BOLUS
1000.0000 mL | Freq: Once | INTRAVENOUS | Status: AC
  Administered 2018-08-19: 1000 mL via INTRAVENOUS

## 2018-08-19 MED ORDER — ONDANSETRON 4 MG PO TBDP: 4.0000 mg | ORAL_TABLET | Freq: Once | ORAL | Status: DC | PRN

## 2018-08-19 MED ORDER — ONDANSETRON 4 MG PO TBDP: 4.0000 mg | ORAL_TABLET | Freq: Three times a day (TID) | ORAL | 0 refills | Status: DC | PRN

## 2018-08-19 NOTE — Discharge Instructions (Addendum)
Home to rest.  Clear liquid diet and advance to bland diet slowly as tolerated. Take Zofran as needed as prescribed for nausea and vomiting. Follow-up with your PCP, return to ER for worsening or concerning symptoms.

## 2018-08-19 NOTE — ED Provider Notes (Signed)
[facility] EMERGENCY DEPARTMENT Provider Note   CSN: [id number] Arrival date & time: 08/19/18  1639     History   Chief Complaint Chief Complaint  Patient presents with  . Nausea  . Emesis  . Abdominal Pain    HPI Danielle [name] is a 82 y.o. female.  82yo female brought in by EMS from home for vomiting x 3-4 days, unable to tolerate liquids/solids. Reports slight upper abdominal discomfort. Denies changes in bowel or bladder habits, fevers, chills, chest pain, SHOB, sick contacts. No other complaints or concerns. Patient was given Zofran and IV fluids by EMS, symptoms have greatly improved. History of breast cancer, in remission x 1 year, also AV block with pacemaker.      Past Medical History:  Diagnosis Date  . ALLERGIC RHINITIS 01/09/2008  . ANEMIA-NOS 01/09/2008  . ANXIETY 01/09/2008  . ASTHMATIC BRONCHITIS, ACUTE 10/20/2008  . AV BLOCK, COMPLETE 12/16/2009  . DEPRESSION 01/09/2008  . Dizziness and giddiness 02/25/2010  . DVT, HX OF 01/09/2008  . Genetic testing 06/04/2017   Ms. Boeder underwent genetic counseling and testing for hereditary cancer syndromes on 05/24/2017. Her results were negative for mutations in all 46 genes analyzed by [facility]'s 46-gene Common Hereditary Cancers Panel. Genes analyzed include: APC, ATM, AXIN2, BARD1, BMPR1A, BRCA1, BRCA2, BRIP1, CDH1, CDKN2A, CHEK2, CTNNA1, DICER1, EPCAM, GREM1, HOXB13, KIT, MEN1, MLH1, MSH2, MSH3, MSH6, MUTYH, NBN  . GERD 01/09/2008  . GLUCOSE INTOLERANCE 01/09/2008  . History of kidney stones   . HYPERLIPIDEMIA 01/09/2008  . HYPERTENSION 01/09/2008  . HYPOTHYROIDISM 01/09/2008  . Impaired glucose tolerance 07/28/2011  . INSOMNIA-SLEEP DISORDER-UNSPEC 06/15/2009  . NEPHROLITHIASIS, HX OF 01/09/2008  . OSTEOARTHRITIS, CERVICAL SPINE 02/25/2010  . Presence of permanent cardiac pacemaker    medtronic    Patient Active Problem List   Diagnosis Date Noted  . Genetic testing 06/04/2017  . Malignant neoplasm  of overlapping sites of right breast in female, estrogen receptor negative ([place]) 04/24/2017  . Chronic venous insufficiency 03/15/2017  . Subungual hematoma of foot 03/15/2017  . Dyspepsia 03/07/2017  . Nausea & vomiting 02/22/2017  . Cough 02/22/2017  . Hematuria 02/22/2017  . S/P right mastectomy 01/11/2017  . Breast cancer of upper-inner quadrant of right female breast ([place]) 10/06/2016  . Right hip pain 07/18/2016  . Olecranon bursitis of left elbow 05/05/2015  . Alopecia 01/21/2015  . Left leg swelling 01/21/2015  . Cervical radiculopathy 07/11/2013  . Preventative health care 08/03/2012  . Dizziness 08/03/2012  . Rash 03/26/2012  . Pacemaker-medtronic 01/23/2012  . Impaired glucose tolerance 07/28/2011  . OSTEOARTHRITIS, CERVICAL SPINE 02/25/2010  . Dizziness and giddiness 02/25/2010  . AV BLOCK, COMPLETE 12/16/2009  . INSOMNIA-SLEEP DISORDER-UNSPEC 06/15/2009  . Hypothyroidism 01/09/2008  . Hyperlipidemia 01/09/2008  . ANEMIA-NOS 01/09/2008  . ANXIETY 01/09/2008  . Depression 01/09/2008  . Essential hypertension 01/09/2008  . ALLERGIC RHINITIS 01/09/2008  . GERD 01/09/2008  . DVT, HX OF 01/09/2008  . NEPHROLITHIASIS, HX OF 01/09/2008    Past Surgical History:  Procedure Laterality Date  . BREAST BIOPSY Right 09/20/2016   malignant  . ERCP W/ SPHICTEROTOMY    . HEMORRHOID SURGERY    . MASTECTOMY Right 01/11/2017  . MASTECTOMY W/ SENTINEL NODE BIOPSY Right 01/11/2017   Procedure: RIGHT MASTECTOMY WITH SENTINEL LYMPH NODE BIOPSY;  Surgeon: [name] Klein, MD;  Location: [facility];  Service: General;  Laterality: Right;  . PACEMAKER PLACEMENT     MedTronic EnPulse [id number]  . PERMANENT PACEMAKER GENERATOR CHANGE N/A  07/16/2012   Procedure: PERMANENT PACEMAKER GENERATOR CHANGE;  Surgeon: Deboraha Sprang, MD;  Location: [facility] CATH LAB;  Service: Cardiovascular;  Laterality: N/A;  . POLYPECTOMY     Uterine     OB History   None      Home Medications    Prior to  Admission medications   Medication Sig Start Date End Date Taking? Authorizing Provider  atenolol (TENORMIN) 50 MG tablet TAKE 1 TABLET(50 MG) BY MOUTH DAILY 08/19/18   Biagio Borg, MD  atorvastatin (LIPITOR) 10 MG tablet TAKE 1 TABLET(10 MG) BY MOUTH DAILY 08/19/18   Biagio Borg, MD  azelastine (ASTELIN) 0.1 % nasal spray Place 1 spray into both nostrils 2 (two) times daily. Use in each nostril as directed 12/18/14   Biagio Borg, MD  Cholecalciferol (VITAMIN D) 2000 units CAPS Take 10,000 Units by mouth daily.    [provider]  enalapril (VASOTEC) 10 MG tablet TAKE 1 TABLET(10 MG) BY MOUTH DAILY 08/19/18   Biagio Borg, MD  levothyroxine (SYNTHROID, LEVOTHROID) 112 MCG tablet Take 1 tablet (112 mcg total) by mouth daily. 03/01/18   Biagio Borg, MD  ondansetron (ZOFRAN ODT) 4 MG disintegrating tablet Take 1 tablet (4 mg total) by mouth every 8 (eight) hours as needed for nausea or vomiting. 08/19/18   Tacy Learn, PA-C  pantoprazole (PROTONIX) 40 MG tablet Take 1 tablet (40 mg total) by mouth as needed. 05/06/18   Biagio Borg, MD  traMADol (ULTRAM-ER) 200 MG 24 hr tablet Take 1 tablet (200 mg total) by mouth daily. 03/01/18   Biagio Borg, MD  TraMADol HCl 200 MG CP24 1 tab by mouth daily as needed for mod pain 03/01/18   Biagio Borg, MD  zolpidem (AMBIEN CR) 12.5 MG CR tablet take 1 tablet by mouth at bedtime if needed 12/18/14   Biagio Borg, MD    Family History Family History  Problem Relation Age of Onset  . Sudden death Mother        d.65s  . Breast cancer Mother 40  . Pulmonary embolism Father        Possible  . Lung cancer Paternal Grandfather        history of smoking  . Breast cancer Maternal Aunt        d.78s    Social History Social History   Tobacco Use  . Smoking status: Former Smoker    Packs/day: 0.20    Years: 25.00    Pack years: 5.00    Types: Cigarettes    Last attempt to quit: 11/21/1971    Years since quitting: 46.7  . Smokeless tobacco:  Never Used  Substance Use Topics  . Alcohol use: Yes    Comment: wine  . Drug use: No     Allergies   Codeine and Oxycodone   Review of Systems Review of Systems  Constitutional: Negative for chills, fatigue and fever.  Respiratory: Negative for shortness of breath.   Cardiovascular: Negative for chest pain.  Gastrointestinal: Positive for abdominal pain, nausea and vomiting. Negative for abdominal distention, blood in stool, constipation and diarrhea.  Genitourinary: Negative for decreased urine volume, difficulty urinating and dysuria.  Skin: Negative for rash and wound.  Neurological: Negative for dizziness, weakness and headaches.  Hematological: Does not bruise/bleed easily.  Psychiatric/Behavioral: Negative for confusion.  All other systems reviewed and are negative.    Physical Exam Updated Vital Signs BP (!) 192/76   Pulse 68  Temp 97.9 F (36.6 C) (Oral)   Resp 18   Wt 54 kg   SpO2 97%   BMI 21.08 kg/m   Physical Exam  Constitutional: She is oriented to person, place, and time. She appears well-developed and well-nourished. No distress.  HENT:  Head: Normocephalic and atraumatic.  Cardiovascular: Normal rate, regular rhythm, normal heart sounds and intact distal pulses.  Pulmonary/Chest: Effort normal and breath sounds normal.  Abdominal: Normal appearance and bowel sounds are normal. There is tenderness in the epigastric area. There is no CVA tenderness.  Very slight epigastric tenderness   Neurological: She is alert and oriented to person, place, and time.  Skin: Skin is warm and dry. No rash noted. She is not diaphoretic.  Psychiatric: She has a normal mood and affect. Her behavior is normal.  Nursing note and vitals reviewed.    ED Treatments / Results  Labs (all labs ordered are listed, but only abnormal results are displayed) Labs Reviewed  COMPREHENSIVE METABOLIC PANEL - Abnormal; Notable for the following components:      Result Value    Glucose, Bld 118 (*)    BUN 24 (*)    Total Protein 6.3 (*)    Total Bilirubin 1.4 (*)    GFR calc non Af Amer 54 (*)    All other components within normal limits  CBC - Abnormal; Notable for the following components:   RBC 3.22 (*)    Hemoglobin 10.0 (*)    HCT 30.9 (*)    Platelets 138 (*)    All other components within normal limits  URINALYSIS, ROUTINE W REFLEX MICROSCOPIC - Abnormal; Notable for the following components:   Ketones, ur 5 (*)    All other components within normal limits  LIPASE, BLOOD    EKG None  Radiology Ct Abdomen Pelvis W Contrast  Result Date: 08/19/2018 CLINICAL DATA:  Abdominal pain, nausea, vomiting and diarrhea for the past 2 days. EXAM: CT ABDOMEN AND PELVIS WITH CONTRAST TECHNIQUE: Multidetector CT imaging of the abdomen and pelvis was performed using the standard protocol following bolus administration of intravenous contrast. CONTRAST:  19m OMNIPAQUE IOHEXOL 300 MG/ML  SOLN COMPARISON:  Abdomen pelvis radiograph dated 01/18/2015. Abdomen and pelvis CT dated 01/24/2011. FINDINGS: Lower chest: Mild scarring at both lung bases with little change. Hepatobiliary: 2 small liver cysts are unchanged. Normal appearing gallbladder. Pancreas: Interval mild diffuse pancreatic atrophy and minimally dilated pancreatic duct, measuring 2.5 mm in maximum diameter. Stable dilated common duct and central intrahepatic ducts with no obstructing mass or calculi. Spleen: Normal in size without focal abnormality. Adrenals/Urinary Tract: Normal appearing adrenal glands. Small bilateral renal cysts. 5 mm lower pole right renal calculus. No other urinary tract calculi are seen. No hydronephrosis. Stomach/Bowel: Unremarkable stomach, small bowel and colon. No evidence of appendicitis. Vascular/Lymphatic: Atheromatous arterial calcifications without aneurysm. No enlarged lymph nodes. Reproductive: Uterus and bilateral adnexa are unremarkable. Other: No abdominal wall hernia or  abnormality. No abdominopelvic ascites. Musculoskeletal: Lumbar and lower thoracic spine degenerative changes and moderate levoconvex scoliosis. Marked right hip degenerative changes and moderate left hip degenerative changes. IMPRESSION: 1. No acute abnormality. 2. Interval mild diffuse pancreatic atrophy and minimally dilated pancreatic duct. 3. Stable chronically dilated common duct and central intrahepatic ducts. 4. 5 mm nonobstructing lower pole right renal calculus. Electronically Signed   By: SClaudie ReveringM.D.   On: 08/19/2018 20:17    Procedures Procedures (including critical care time)  Medications Ordered in ED Medications  ondansetron (ZOFRAN) injection 4  mg (has no administration in time range)  sodium chloride 0.9 % bolus 1,000 mL (0 mLs Intravenous Stopped 08/19/18 2054)  iohexol (OMNIPAQUE) 300 MG/ML solution 100 mL (100 mLs Intravenous Contrast Given 08/19/18 1943)     Initial Impression / Assessment and Plan / ED Course  I have reviewed the triage vital signs and the nursing notes.  Pertinent labs & imaging results that were available during my care of the patient were reviewed by me and considered in my medical decision making (see chart for details).  Clinical Course as of Aug 20 2055  Mon Aug 19, 4652  [id number] 82 year old female presents with complaint of nausea and vomiting with mild epigastric discomfort for the past 3 to 4 days.  States she is unable to tolerate liquids or solids at this point.  Patient called EMS today, was given IV fluids and Zofran prior to arrival and states she is feeling better.  Same she has very mild tenderness in the epigastric area and dry mucous membranes.  Urinalysis shows ketones otherwise normal, CMP with increase in BUN, mild increase in her total bilirubin.  Lipase is normal, CBC with slight anemia with hemoglobin of 10, hematocrit of 30.9 platelets 138.  CT abdomen pelvis with contrast is without acute findings.  Patient feels much better after  Zofran and additional fluids, she is tolerating p.o. fluids at this time.  Patient is ready for discharge home, given prescription for Zofran ODT and plans to follow-up with her PCP.  Patient agrees to return to the ER for any worsening or concerning symptoms.   [LM]    Clinical Course User Index [LM] Tacy Learn, PA-C   Final Clinical Impressions(s) / ED Diagnoses   Final diagnoses:  Non-intractable vomiting with nausea, unspecified vomiting type    ED Discharge Orders         Ordered    ondansetron (ZOFRAN ODT) 4 MG disintegrating tablet  Every 8 hours PRN     08/19/18 2040           Tacy Learn, PA-C 08/19/18 2056    Lennice Sites, DO 08/20/18 2751

## 2018-08-19 NOTE — ED Triage Notes (Signed)
Pt presents from home with [facility] for abd pain and n/v/d on going for 2 days; unable to keep any food/liquid down for 24 hrs; pt hx of dual chamber pacer; EMS gave 4mg  zofran and 900cc NS

## 2018-08-28 ENCOUNTER — Telehealth: Payer: Self-pay

## 2018-08-28 ENCOUNTER — Ambulatory Visit (INDEPENDENT_AMBULATORY_CARE_PROVIDER_SITE_OTHER): Payer: Medicare Other | Admitting: *Deleted

## 2018-08-28 DIAGNOSIS — I442 Atrioventricular block, complete: Secondary | ICD-10-CM

## 2018-08-28 NOTE — Telephone Encounter (Signed)
LMOVM reminding pt to send remote transmission.   

## 2018-08-29 NOTE — Progress Notes (Signed)
Remote pacemaker transmission.   

## 2018-10-08 LAB — CUP PACEART REMOTE DEVICE CHECK
Battery Impedance: 843 Ohm
Battery Remaining Longevity: 43 mo
Brady Statistic AP VP Percent: 75 %
Brady Statistic AS VP Percent: 25 %
Date Time Interrogation Session: 20191009201614
Implantable Lead Location: 753860
Implantable Lead Model: 4068
Implantable Lead Model: 4068
Implantable Pulse Generator Implant Date: 20130827
Lead Channel Impedance Value: 806 Ohm
Lead Channel Impedance Value: 823 Ohm
Lead Channel Pacing Threshold Amplitude: 2.5 V
Lead Channel Pacing Threshold Pulse Width: 0.4 ms
Lead Channel Setting Pacing Amplitude: 2.5 V
MDC IDC LEAD IMPLANT DT: 19970117
MDC IDC LEAD IMPLANT DT: 19970117
MDC IDC LEAD LOCATION: 753859
MDC IDC MSMT BATTERY VOLTAGE: 2.76 V
MDC IDC MSMT LEADCHNL RA PACING THRESHOLD PULSEWIDTH: 0.4 ms
MDC IDC MSMT LEADCHNL RV PACING THRESHOLD AMPLITUDE: 2 V
MDC IDC SET LEADCHNL RV PACING AMPLITUDE: 4 V
MDC IDC SET LEADCHNL RV PACING PULSEWIDTH: 0.4 ms
MDC IDC SET LEADCHNL RV SENSING SENSITIVITY: 5.6 mV
MDC IDC STAT BRADY AP VS PERCENT: 0 %
MDC IDC STAT BRADY AS VS PERCENT: 0 %

## 2018-11-27 ENCOUNTER — Ambulatory Visit (INDEPENDENT_AMBULATORY_CARE_PROVIDER_SITE_OTHER): Payer: Medicare Other

## 2018-11-27 DIAGNOSIS — I442 Atrioventricular block, complete: Secondary | ICD-10-CM | POA: Diagnosis not present

## 2018-11-28 LAB — CUP PACEART REMOTE DEVICE CHECK
Battery Impedance: 894 Ohm
Battery Remaining Longevity: 47 mo
Brady Statistic AP VP Percent: 70 %
Date Time Interrogation Session: 20200108163045
Implantable Lead Implant Date: 19970117
Implantable Lead Implant Date: 19970117
Implantable Lead Model: 4068
Implantable Lead Model: 4068
Implantable Pulse Generator Implant Date: 20130827
Lead Channel Impedance Value: 793 Ohm
Lead Channel Impedance Value: 825 Ohm
Lead Channel Pacing Threshold Amplitude: 2.5 V
Lead Channel Pacing Threshold Pulse Width: 0.4 ms
Lead Channel Setting Pacing Amplitude: 2.5 V
Lead Channel Setting Pacing Amplitude: 3.25 V
Lead Channel Setting Sensing Sensitivity: 5.6 mV
MDC IDC LEAD LOCATION: 753859
MDC IDC LEAD LOCATION: 753860
MDC IDC MSMT BATTERY VOLTAGE: 2.77 V
MDC IDC MSMT LEADCHNL RV PACING THRESHOLD AMPLITUDE: 1.625 V
MDC IDC MSMT LEADCHNL RV PACING THRESHOLD PULSEWIDTH: 0.4 ms
MDC IDC SET LEADCHNL RV PACING PULSEWIDTH: 0.4 ms
MDC IDC STAT BRADY AP VS PERCENT: 0 %
MDC IDC STAT BRADY AS VP PERCENT: 30 %
MDC IDC STAT BRADY AS VS PERCENT: 0 %

## 2018-11-28 NOTE — Progress Notes (Signed)
Remote pacemaker transmission.   

## 2018-12-17 ENCOUNTER — Ambulatory Visit (INDEPENDENT_AMBULATORY_CARE_PROVIDER_SITE_OTHER): Payer: Medicare Other | Admitting: Internal Medicine

## 2018-12-17 ENCOUNTER — Encounter: Payer: Self-pay | Admitting: Internal Medicine

## 2018-12-17 ENCOUNTER — Other Ambulatory Visit: Payer: Self-pay | Admitting: Internal Medicine

## 2018-12-17 DIAGNOSIS — I10 Essential (primary) hypertension: Secondary | ICD-10-CM

## 2018-12-17 DIAGNOSIS — Z23 Encounter for immunization: Secondary | ICD-10-CM | POA: Diagnosis not present

## 2018-12-17 DIAGNOSIS — M1611 Unilateral primary osteoarthritis, right hip: Secondary | ICD-10-CM | POA: Diagnosis not present

## 2018-12-17 DIAGNOSIS — R112 Nausea with vomiting, unspecified: Secondary | ICD-10-CM

## 2018-12-17 HISTORY — DX: Unilateral primary osteoarthritis, right hip: M16.11

## 2018-12-17 MED ORDER — HYDROCODONE-ACETAMINOPHEN 5-325 MG PO TABS: 1.0000 | ORAL_TABLET | Freq: Four times a day (QID) | ORAL | 0 refills | Status: DC | PRN

## 2018-12-17 NOTE — Assessment & Plan Note (Signed)
Severe, needs to walk with cane, for hydrocodone prn limited rx, and refer ortho

## 2018-12-17 NOTE — Assessment & Plan Note (Signed)
stable overall by history and exam, recent data reviewed with pt, and pt to continue medical treatment as before,  to f/u any worsening symptoms or concerns  

## 2018-12-17 NOTE — Patient Instructions (Signed)
Ok to stop the tramadol as you have  Please take all new medication as prescribed - the hydrocodone for pain as needed (use sparingly)  You will be contacted regarding the referral for: Orthopedic  Please continue all other medications as before, and refills have been done if requested.  Please have the pharmacy call with any other refills you may need.  Please keep your appointments with your specialists as you may have planned

## 2018-12-17 NOTE — Progress Notes (Signed)
Subjective:    Patient ID: Danielle [name], female    DOB: 04-06-1936, 83 y.o.   MRN: [id number]  HPI  Here to f/u illness from sept 2019 with a d/c paper she has underlined multiple thing and has questions.  hospd for vomiting, and abd pain x 4 days, went to ED to have zofran and IVF.  Admitted for same, had several testing including GFR or 54, and CT with no acute findings, but had evidence for pancreatic atrophy, lumbar DDD and 5 mm right lower renal stone. Very concerned her n/v will happen again though none since sept 2019.  Has been putting off right hip severe DJD surgury, and has mod left hip DJD as well, very difficult to ambulate, not using cane, and very resistant to surgury, but now reconsidering.  Asking for pain med bc recent tramadol caused n/v. Past Medical History:  Diagnosis Date  . ALLERGIC RHINITIS 01/09/2008  . ANEMIA-NOS 01/09/2008  . ANXIETY 01/09/2008  . ASTHMATIC BRONCHITIS, ACUTE 10/20/2008  . AV BLOCK, COMPLETE 12/16/2009  . DEPRESSION 01/09/2008  . Dizziness and giddiness 02/25/2010  . DVT, HX OF 01/09/2008  . Genetic testing 06/04/2017   Ms. Dahmer underwent genetic counseling and testing for hereditary cancer syndromes on 05/24/2017. Her results were negative for mutations in all 46 genes analyzed by [facility]'s 46-gene Common Hereditary Cancers Panel. Genes analyzed include: APC, ATM, AXIN2, BARD1, BMPR1A, BRCA1, BRCA2, BRIP1, CDH1, CDKN2A, CHEK2, CTNNA1, DICER1, EPCAM, GREM1, HOXB13, KIT, MEN1, MLH1, MSH2, MSH3, MSH6, MUTYH, NBN  . GERD 01/09/2008  . GLUCOSE INTOLERANCE 01/09/2008  . History of kidney stones   . HYPERLIPIDEMIA 01/09/2008  . HYPERTENSION 01/09/2008  . HYPOTHYROIDISM 01/09/2008  . Impaired glucose tolerance 07/28/2011  . INSOMNIA-SLEEP DISORDER-UNSPEC 06/15/2009  . NEPHROLITHIASIS, HX OF 01/09/2008  . OSTEOARTHRITIS, CERVICAL SPINE 02/25/2010  . Presence of permanent cardiac pacemaker    medtronic   Past Surgical History:  Procedure Laterality Date  .  BREAST BIOPSY Right 09/20/2016   malignant  . ERCP W/ SPHICTEROTOMY    . HEMORRHOID SURGERY    . MASTECTOMY Right 01/11/2017  . MASTECTOMY W/ SENTINEL NODE BIOPSY Right 01/11/2017   Procedure: RIGHT MASTECTOMY WITH SENTINEL LYMPH NODE BIOPSY;  Surgeon: [name] Klein, MD;  Location: [facility];  Service: General;  Laterality: Right;  . PACEMAKER PLACEMENT     MedTronic EnPulse [id number]  . PERMANENT PACEMAKER GENERATOR CHANGE N/A 07/16/2012   Procedure: PERMANENT PACEMAKER GENERATOR CHANGE;  Surgeon: Deboraha Sprang, MD;  Location[facility] CATH LAB;  Service: Cardiovascular;  Laterality: N/A;  . POLYPECTOMY     Uterine    reports that she quit smoking about 47 years ago. Her smoking use included cigarettes. She has a 5.00 pack-year smoking history. She has never used smokeless tobacco. She reports current alcohol use. She reports that she does not use drugs. family history includes Breast cancer in her maternal aunt; Breast cancer (age of onset: 98) in her mother; Lung cancer in her paternal grandfather; Pulmonary embolism in her father; Sudden death in her mother. Allergies  Allergen Reactions  . Codeine Nausea And Vomiting  . Oxycodone Nausea And Vomiting   Current Outpatient Medications on File Prior to Visit  Medication Sig Dispense Refill  . atenolol (TENORMIN) 50 MG tablet TAKE 1 TABLET(50 MG) BY MOUTH DAILY 90 tablet 0  . atorvastatin (LIPITOR) 10 MG tablet TAKE 1 TABLET(10 MG) BY MOUTH DAILY 90 tablet 0  . azelastine (ASTELIN) 0.1 % nasal spray Place 1 spray into both  nostrils 2 (two) times daily. Use in each nostril as directed 30 mL 11  . Cholecalciferol (VITAMIN D) 2000 units CAPS Take 10,000 Units by mouth daily.    . enalapril (VASOTEC) 10 MG tablet TAKE 1 TABLET(10 MG) BY MOUTH DAILY 90 tablet 0  . levothyroxine (SYNTHROID, LEVOTHROID) 112 MCG tablet Take 1 tablet (112 mcg total) by mouth daily. 90 tablet 3  . ondansetron (ZOFRAN ODT) 4 MG disintegrating tablet Take 1 tablet (4 mg total)  by mouth every 8 (eight) hours as needed for nausea or vomiting. 12 tablet 0  . pantoprazole (PROTONIX) 40 MG tablet Take 1 tablet (40 mg total) by mouth as needed. 90 tablet 0  . traMADol (ULTRAM-ER) 200 MG 24 hr tablet Take 1 tablet (200 mg total) by mouth daily. 7 tablet 0  . TraMADol HCl 200 MG CP24 1 tab by mouth daily as needed for mod pain 30 capsule 5  . zolpidem (AMBIEN CR) 12.5 MG CR tablet take 1 tablet by mouth at bedtime if needed 30 tablet 5   No current facility-administered medications on file prior to visit.    Review of Systems  Constitutional: Negative for other unusual diaphoresis or sweats HENT: Negative for ear discharge or swelling Eyes: Negative for other worsening visual disturbances Respiratory: Negative for stridor or other swelling  Gastrointestinal: Negative for worsening distension or other blood Genitourinary: Negative for retention or other urinary change Musculoskeletal: Negative for other MSK pain or swelling Skin: Negative for color change or other new lesions Neurological: Negative for worsening tremors and other numbness  Psychiatric/Behavioral: Negative for worsening agitation or other fatigue All other system eng per pt    Objective:   Physical Exam BP 128/84   Pulse 73   Temp 98.5 F (36.9 C) (Oral)   Ht _0  (1.6 m)   Wt 108 lb (49 kg)   SpO2 98%   BMI 19.13 kg/m  VS noted,  Constitutional: Pt appears in NAD HENT: Head: NCAT.  Right Ear: External ear normal.  Left Ear: External ear normal.  Eyes: . Pupils are equal, round, and reactive to light. Conjunctivae and EOM are normal Nose: without d/c or deformity Neck: Neck supple. Gross normal ROM Cardiovascular: Normal rate and regular rhythm.   Pulmonary/Chest: Effort normal and breath sounds without rales or wheezing.  Abd:  Soft, NT, ND, + BS, no organomegaly Neurological: Pt is alert. At baseline orientation, motor grossly intact Skin: Skin is warm. No rashes, other new lesions, no  LE edema Psychiatric: Pt behavior is normal without agitation  Bilat hip pain with flexion, right > left, stiff walking       Assessment & Plan:

## 2018-12-17 NOTE — Assessment & Plan Note (Signed)
D/w pt, unlikely to return except for the tramadol, now for intolerance listed

## 2018-12-18 NOTE — Telephone Encounter (Signed)
Done erx 

## 2018-12-24 ENCOUNTER — Ambulatory Visit (INDEPENDENT_AMBULATORY_CARE_PROVIDER_SITE_OTHER): Payer: Self-pay | Admitting: Orthopaedic Surgery

## 2018-12-25 ENCOUNTER — Ambulatory Visit (INDEPENDENT_AMBULATORY_CARE_PROVIDER_SITE_OTHER): Payer: Medicare Other

## 2018-12-25 ENCOUNTER — Encounter (INDEPENDENT_AMBULATORY_CARE_PROVIDER_SITE_OTHER): Payer: Self-pay | Admitting: Orthopaedic Surgery

## 2018-12-25 ENCOUNTER — Ambulatory Visit (INDEPENDENT_AMBULATORY_CARE_PROVIDER_SITE_OTHER): Payer: Medicare Other | Admitting: Orthopaedic Surgery

## 2018-12-25 DIAGNOSIS — M160 Bilateral primary osteoarthritis of hip: Secondary | ICD-10-CM

## 2018-12-25 DIAGNOSIS — M1611 Unilateral primary osteoarthritis, right hip: Secondary | ICD-10-CM

## 2018-12-25 NOTE — Progress Notes (Signed)
Office Visit Note   Patient: Danielle [name]           Date of Birth: 1936/10/09           MRN: [id number] Visit Date: 12/25/2018              Requested by: Biagio Borg, MD [address] [place], [place] [address] PCP: Biagio Borg, MD   Assessment & Plan: Visit Diagnoses:  1. Primary osteoarthritis of right hip     Plan: Impression is end-stage right hip degenerative joint disease and moderate left hip degenerative joint disease.  Overall her left hip is still functioning okay but her right hip is severely degenerative.  She understands that she is beyond what cortisone injections and other nonsurgical treatments would likely offer in terms of pain relief.  She understands that a total hip replacement is what is needed to allow her to regain her ability to function and be active.  We discussed the risks and benefits and rehab and recovery related to her right hip replacement and she elects to proceed.  Given her age and medical comorbidities we will obtain preoperative medical and cardiac clearance from Dr. Jenny Reichmann and Dr. Caryl Comes prior to scheduling surgery.  My surgery scheduler will be in touch with her in the near future.  All questions answered to her satisfaction. Total face to face encounter time was greater than 45 minutes and over half of this time was spent in counseling and/or coordination of care.  Follow-Up Instructions: Return if symptoms worsen or fail to improve.   Orders:  Orders Placed This Encounter  Procedures  . XR HIP UNILAT W OR W/O PELVIS 2-3 VIEWS LEFT  . XR HIP UNILAT W OR W/O PELVIS 2-3 VIEWS RIGHT   No orders of the defined types were placed in this encounter.     Procedures: No procedures performed   Clinical Data: No additional findings.   Subjective: Chief Complaint  Patient presents with  . Right Hip - Pain    Danielle [name] is a 83 year old female who is very active and still tends to her forearm with her husband who comes in with 5  years of worsening right hip pain.  Recently she has had some left hip pain.  She endorses hip and groin pain that severely limits her ADLs and quality of life.  She has done physical therapy but is now at a point where it is no longer helping.  She has taken over-the-counter medicines extensively but they no longer work.  She denies any back pain or radicular symptoms.   Review of Systems  Constitutional: Negative.   HENT: Negative.   Eyes: Negative.   Respiratory: Negative.   Cardiovascular: Negative.   Endocrine: Negative.   Musculoskeletal: Negative.   Neurological: Negative.   Hematological: Negative.   Psychiatric/Behavioral: Negative.   All other systems reviewed and are negative.    Objective: Vital Signs: There were no vitals taken for this visit.  Physical Exam Vitals signs and nursing note reviewed.  Constitutional:      Appearance: She is well-developed.  HENT:     Head: Normocephalic and atraumatic.  Neck:     Musculoskeletal: Neck supple.  Pulmonary:     Effort: Pulmonary effort is normal.  Abdominal:     Palpations: Abdomen is soft.  Skin:    General: Skin is warm.     Capillary Refill: Capillary refill takes less than 2 seconds.  Neurological:  Mental Status: She is alert and oriented to person, place, and time.  Psychiatric:        Behavior: Behavior normal.        Thought Content: Thought content normal.        Judgment: Judgment normal.     Ortho Exam Right hip exam demonstrates no internal and external rotation.  Positive Stinchfield sign.  Trochanteric bursa is mildly tender.  Negative sciatic tension sign.  Positive logroll. Left hip exam shows mild tenderness to the trochanteric bursa.  She has well-preserved internal and external rotation.  Negative logroll.  Negative sciatic tension sign. Specialty Comments:  No specialty comments available.  Imaging: Xr Hip Unilat W Or W/o Pelvis 2-3 Views Left  Result Date: 12/25/2018 Moderate  degenerative joint disease of the left hip.  Xr Hip Unilat W Or W/o Pelvis 2-3 Views Right  Result Date: 12/25/2018 Advanced degenerative joint disease with bone-on-bone joint space narrowing and subchondral cystic formation    PMFS History: Patient Active Problem List   Diagnosis Date Noted  . Degenerative joint disease of right hip 12/17/2018  . Genetic testing 06/04/2017  . Malignant neoplasm of overlapping sites of right breast in female, estrogen receptor negative ([place]) 04/24/2017  . Chronic venous insufficiency 03/15/2017  . Subungual hematoma of foot 03/15/2017  . Dyspepsia 03/07/2017  . Nausea & vomiting 02/22/2017  . Cough 02/22/2017  . Hematuria 02/22/2017  . S/P right mastectomy 01/11/2017  . Breast cancer of upper-inner quadrant of right female breast ([place]) 10/06/2016  . Right hip pain 07/18/2016  . Olecranon bursitis of left elbow 05/05/2015  . Alopecia 01/21/2015  . Left leg swelling 01/21/2015  . Cervical radiculopathy 07/11/2013  . Preventative health care 08/03/2012  . Dizziness 08/03/2012  . Rash 03/26/2012  . Pacemaker-medtronic 01/23/2012  . Impaired glucose tolerance 07/28/2011  . OSTEOARTHRITIS, CERVICAL SPINE 02/25/2010  . Dizziness and giddiness 02/25/2010  . AV BLOCK, COMPLETE 12/16/2009  . INSOMNIA-SLEEP DISORDER-UNSPEC 06/15/2009  . Hypothyroidism 01/09/2008  . Hyperlipidemia 01/09/2008  . ANEMIA-NOS 01/09/2008  . ANXIETY 01/09/2008  . Depression 01/09/2008  . Essential hypertension 01/09/2008  . ALLERGIC RHINITIS 01/09/2008  . GERD 01/09/2008  . DVT, HX OF 01/09/2008  . NEPHROLITHIASIS, HX OF 01/09/2008   Past Medical History:  Diagnosis Date  . ALLERGIC RHINITIS 01/09/2008  . ANEMIA-NOS 01/09/2008  . ANXIETY 01/09/2008  . ASTHMATIC BRONCHITIS, ACUTE 10/20/2008  . AV BLOCK, COMPLETE 12/16/2009  . DEPRESSION 01/09/2008  . Dizziness and giddiness 02/25/2010  . DVT, HX OF 01/09/2008  . Genetic testing 06/04/2017   Danielle [name] underwent genetic  counseling and testing for hereditary cancer syndromes on 05/24/2017. Her results were negative for mutations in all 46 genes analyzed by [employer]'s 46-gene Common Hereditary Cancers Panel. Genes analyzed include: APC, ATM, AXIN2, BARD1, BMPR1A, BRCA1, BRCA2, BRIP1, CDH1, CDKN2A, CHEK2, CTNNA1, DICER1, EPCAM, GREM1, HOXB13, KIT, MEN1, MLH1, MSH2, MSH3, MSH6, MUTYH, NBN  . GERD 01/09/2008  . GLUCOSE INTOLERANCE 01/09/2008  . History of kidney stones   . HYPERLIPIDEMIA 01/09/2008  . HYPERTENSION 01/09/2008  . HYPOTHYROIDISM 01/09/2008  . Impaired glucose tolerance 07/28/2011  . INSOMNIA-SLEEP DISORDER-UNSPEC 06/15/2009  . NEPHROLITHIASIS, HX OF 01/09/2008  . OSTEOARTHRITIS, CERVICAL SPINE 02/25/2010  . Presence of permanent cardiac pacemaker    medtronic    Family History  Problem Relation Age of Onset  . Sudden death Mother        d.57s  . Breast cancer Mother 70  . Pulmonary embolism Father  Possible  . Lung cancer Paternal Grandfather        history of smoking  . Breast cancer Maternal Aunt        d.78s    Past Surgical History:  Procedure Laterality Date  . BREAST BIOPSY Right 09/20/2016   malignant  . ERCP W/ SPHICTEROTOMY    . HEMORRHOID SURGERY    . MASTECTOMY Right 01/11/2017  . MASTECTOMY W/ SENTINEL NODE BIOPSY Right 01/11/2017   Procedure: RIGHT MASTECTOMY WITH SENTINEL LYMPH NODE BIOPSY;  Surgeon: [name] Klein, MD;  Location: [facility];  Service: General;  Laterality: Right;  . PACEMAKER PLACEMENT     MedTronic EnPulse [id number]  . PERMANENT PACEMAKER GENERATOR CHANGE N/A 07/16/2012   Procedure: PERMANENT PACEMAKER GENERATOR CHANGE;  Surgeon: Deboraha Sprang, MD;  [facility] CATH LAB;  Service: Cardiovascular;  Laterality: N/A;  . POLYPECTOMY     Uterine   Social History   Occupational History  . Not on file  Tobacco Use  . Smoking status: Former Smoker    Packs/day: 0.20    Years: 25.00    Pack years: 5.00    Types: Cigarettes    Last attempt to quit: 11/21/1971     Years since quitting: 47.1  . Smokeless tobacco: Never Used  Substance and Sexual Activity  . Alcohol use: Yes    Comment: wine  . Drug use: No  . Sexual activity: Not on file

## 2019-01-02 ENCOUNTER — Telehealth: Payer: Self-pay | Admitting: *Deleted

## 2019-01-02 NOTE — Telephone Encounter (Signed)
   [facility] Pre-operative Risk Assessment    Request for surgical clearance:  1. What type of surgery is being performed? RIGHT TOTAL HIP ARTHROPLASTY   2. When is this surgery scheduled? TBD   3. What type of clearance is required (medical clearance vs. Pharmacy clearance to hold med vs. Both)? MEDICAL  4. Are there any medications that need to be held prior to surgery and how long?NONE LISTED    5. Practice name and name of physician performing surgery? [facility]; DR. Legrand Como XU   6. What is your office phone number [phone]    7.   What is your office fax number [phone]  8.   Anesthesia type (None, local, MAC, general) ? SPINAL BLOCK   Julaine Hua 01/02/2019, 1:16 PM  _________________________________________________________________   (provider comments below)

## 2019-01-03 NOTE — Telephone Encounter (Signed)
Left message for patient to contact office for an appt for cardiac clearance.  Spoke with patients spouse he stated he was not with the patient and would prefer that I contact her on the house phone. I told him that I called the home phone and did not get an answer. He stated the patient would call me back at her earliest convenience.  Awaiting a call back from the patient.

## 2019-01-03 NOTE — Telephone Encounter (Signed)
   Primary Cardiologist:Steven Caryl Comes, MD  Chart reviewed as part of pre-operative protocol coverage. Because of Takyla Kuchera Banner's past medical history and time since last visit, he/she will require a follow-up visit in order to better assess preoperative cardiovascular risk.  Pre-op covering staff: - Please schedule appointment and call patient to inform them. - Please contact requesting surgeon's office via preferred method (i.e, phone, fax) to inform them of need for appointment prior to surgery.  If applicable, this message will also be routed to pharmacy pool and/or primary cardiologist for input on holding anticoagulant/antiplatelet agent as requested below so that this information is available at time of patient's appointment.   [place], PA  01/03/2019, 12:05 PM

## 2019-01-06 NOTE — Telephone Encounter (Signed)
LMOM for pt to call our office back to schedule an appt for pre-clearance.

## 2019-01-07 ENCOUNTER — Encounter: Payer: Self-pay | Admitting: Internal Medicine

## 2019-01-07 ENCOUNTER — Ambulatory Visit (INDEPENDENT_AMBULATORY_CARE_PROVIDER_SITE_OTHER): Payer: Medicare Other | Admitting: Internal Medicine

## 2019-01-07 VITALS — BP 128/76 | HR 69 | Ht 63.0 in | Wt 112.8 lb

## 2019-01-07 DIAGNOSIS — I442 Atrioventricular block, complete: Secondary | ICD-10-CM | POA: Diagnosis not present

## 2019-01-07 DIAGNOSIS — Z950 Presence of cardiac pacemaker: Secondary | ICD-10-CM | POA: Diagnosis not present

## 2019-01-07 NOTE — Telephone Encounter (Signed)
Pt has been scheduled to see Dr. Caryl Comes today, 01/07/2019, @ 3:45.

## 2019-01-07 NOTE — Patient Instructions (Signed)

## 2019-01-07 NOTE — Progress Notes (Signed)
Patient Care Team: Biagio Borg, MD as PCP - Particia Lather Revonda Standard, MD as PCP - Cardiology (Cardiology) Jovita Kussmaul, MD as Consulting Physician (General Surgery) Magrinat, Virgie Dad, MD as Consulting Physician (Oncology) Ladene Artist, MD as Consulting Physician (Gastroenterology) Deboraha Sprang, MD as Consulting Physician (Cardiology) Kennon Holter, NP as Nurse Practitioner (Nurse Practitioner) Gareth Morgan, MD as Referring Physician (Orthopedic Surgery)   HPI  Danielle [name] is a 83 y.o. female is seen in followup for complete heart block for which she is status post pacemaker.  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 ALLERGIC RHINITIS 01/09/2008  . ANEMIA-NOS 01/09/2008  . ANXIETY 01/09/2008  . ASTHMATIC BRONCHITIS, ACUTE 10/20/2008  . AV BLOCK, COMPLETE 12/16/2009  . DEPRESSION 01/09/2008  . Dizziness and giddiness 02/25/2010  . DVT, HX OF 01/09/2008  . Genetic testing 06/04/2017   Danielle [name] underwent genetic counseling and testing for hereditary cancer syndromes on 05/24/2017. Her results were negative for mutations in all 46 genes analyzed by [employer]'s 46-gene Common Hereditary Cancers Panel. Genes analyzed include: APC, ATM, AXIN2, BARD1, BMPR1A, BRCA1, BRCA2, BRIP1, CDH1, CDKN2A, CHEK2, CTNNA1, DICER1, EPCAM, GREM1, HOXB13, KIT, MEN1, MLH1, MSH2, MSH3, MSH6, MUTYH, NBN  . GERD 01/09/2008  . GLUCOSE INTOLERANCE 01/09/2008  . History of kidney stones   . HYPERLIPIDEMIA 01/09/2008  . HYPERTENSION 01/09/2008  . HYPOTHYROIDISM 01/09/2008  . Impaired glucose tolerance 07/28/2011  . INSOMNIA-SLEEP DISORDER-UNSPEC 06/15/2009  .  NEPHROLITHIASIS, HX OF 01/09/2008  . OSTEOARTHRITIS, CERVICAL SPINE 02/25/2010  . Presence of permanent cardiac pacemaker    medtronic    Past Surgical History:  Procedure Laterality Date  . BREAST BIOPSY Right 09/20/2016   malignant  . ERCP W/ SPHICTEROTOMY    . HEMORRHOID SURGERY    . MASTECTOMY Right 01/11/2017  . MASTECTOMY W/ SENTINEL NODE BIOPSY Right 01/11/2017   Procedure: RIGHT MASTECTOMY WITH SENTINEL LYMPH NODE BIOPSY;  Surgeon: [name] Sammy Douthitt, MD;  Location: [facility];  Service: General;  Laterality: Right;  . PACEMAKER PLACEMENT     MedTronic EnPulse [id number]  . PERMANENT PACEMAKER GENERATOR CHANGE N/A 07/16/2012   Procedure: PERMANENT PACEMAKER GENERATOR CHANGE;  Surgeon: Deboraha Sprang, MD;  Location[facility] CATH LAB;  Service: Cardiovascular;  Laterality: N/A;  . POLYPECTOMY     Uterine    Current Outpatient Medications  Medication Sig Dispense Refill  . atenolol (TENORMIN) 50 MG tablet TAKE 1 TABLET(50 MG) BY MOUTH DAILY 90 tablet 0  . atorvastatin (LIPITOR) 10 MG tablet TAKE 1 TABLET(10 MG) BY MOUTH DAILY 90 tablet 0  . azelastine (ASTELIN) 0.1 % nasal spray Place 1 spray into both nostrils 2 (two) times daily. Use in each nostril as directed 30 mL 11  . enalapril (VASOTEC) 10 MG tablet TAKE 1 TABLET(10 MG) BY MOUTH DAILY 90 tablet 0  . levothyroxine (SYNTHROID, LEVOTHROID) 112 MCG tablet Take 1 tablet (112 mcg total) by mouth daily. 90 tablet 3  . pantoprazole (PROTONIX) 40 MG tablet Take 1 tablet (40  mg total) by mouth as needed. 90 tablet 0  . TraMADol HCl 200 MG CP24 TAKE ONE CAPSULE BY MOUTH DAILY AS NEEDED FOR MODERATE PAIN. 30 capsule 5   No current facility-administered medications for this visit.     Allergies  Allergen Reactions  . Tramadol Nausea And Vomiting    Vomiting  . Codeine Nausea And Vomiting  . Oxycodone Nausea And Vomiting    Review of Systems negative except from HPI and PMH  Physical Exam BP 128/76   Pulse 69   Ht _0  (1.6 m)   Wt 112  lb 12.8 oz (51.2 kg)   SpO2 98%   BMI 19.98 kg/m  Well developed and well nourished in no acute distress HENT normal Neck supple with JVP-flat Clear Device pocket well healed; without hematoma or erythema.  There is no tethering  Regular rate and rhythm, no  gallop No murmur Abd-soft with active BS No Clubbing cyanosis  edema Skin-warm and dry A & Oriented  Grossly normal sensory and motor function   .ECG AV pacing   Assessment and  Plan  Complete heart block   Pacemaker  Medtronic    Atrial lead threshold increase  Preoperative evaluation   Hypothyroidism  Anemia   Stable for surgery   Cardiovascular risk should be acceptable  Hgb and Thryoid status should be clarified prior to surgery  DEVICE DEPENDENT   Will need device reprogrammed or magnet at time of surgery

## 2019-01-08 ENCOUNTER — Telehealth (INDEPENDENT_AMBULATORY_CARE_PROVIDER_SITE_OTHER): Payer: Self-pay | Admitting: Orthopaedic Surgery

## 2019-01-08 NOTE — Telephone Encounter (Signed)
On average, 1 night in the hospital.  Recovery is 6-8 weeks but she'll be walking right away with a walker then a cane.

## 2019-01-08 NOTE — Telephone Encounter (Signed)
See message below °

## 2019-01-08 NOTE — Telephone Encounter (Signed)
Patient called stating that she has a hip replacement surgery coming up and wanted to know what to expect as far as how many nights in the hospital and how long is she expected to be down.  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  CB#[phone].  If she does not answer, please leave her a voicemail.  Thank you.

## 2019-01-10 LAB — CUP PACEART INCLINIC DEVICE CHECK
Battery Remaining Longevity: 44 mo
Brady Statistic AP VP Percent: 68 %
Brady Statistic AP VS Percent: 0 %
Brady Statistic AS VP Percent: 32 %
Brady Statistic AS VS Percent: 0 %
Implantable Lead Implant Date: 19970117
Implantable Lead Implant Date: 19970117
Implantable Lead Location: 753859
Implantable Lead Location: 753860
Lead Channel Impedance Value: 787 Ohm
Lead Channel Pacing Threshold Amplitude: 1.75 V
Lead Channel Pacing Threshold Amplitude: 1.75 V
Lead Channel Pacing Threshold Amplitude: 2.5 V
Lead Channel Pacing Threshold Pulse Width: 0.4 ms
Lead Channel Pacing Threshold Pulse Width: 0.4 ms
Lead Channel Pacing Threshold Pulse Width: 0.4 ms
Lead Channel Setting Pacing Amplitude: 3.5 V
Lead Channel Setting Pacing Pulse Width: 0.4 ms
MDC IDC MSMT BATTERY IMPEDANCE: 971 Ohm
MDC IDC MSMT BATTERY VOLTAGE: 2.76 V
MDC IDC MSMT LEADCHNL RA IMPEDANCE VALUE: 780 Ohm
MDC IDC MSMT LEADCHNL RA PACING THRESHOLD AMPLITUDE: 2.5 V
MDC IDC MSMT LEADCHNL RA PACING THRESHOLD PULSEWIDTH: 0.4 ms
MDC IDC MSMT LEADCHNL RA SENSING INTR AMPL: 2 mV
MDC IDC PG IMPLANT DT: 20130827
MDC IDC SESS DTM: 20200218221047
MDC IDC SET LEADCHNL RA PACING AMPLITUDE: 2.5 V
MDC IDC SET LEADCHNL RV SENSING SENSITIVITY: 5.6 mV

## 2019-01-10 NOTE — Telephone Encounter (Signed)
Tried calling to discuss. No answer and unable to LM. VM not activated per greeting on phone.

## 2019-01-14 ENCOUNTER — Encounter: Payer: Medicare Other | Admitting: Internal Medicine

## 2019-02-10 ENCOUNTER — Encounter (HOSPITAL_COMMUNITY): Admission: RE | Payer: Self-pay | Source: Home / Self Care

## 2019-02-10 ENCOUNTER — Ambulatory Visit (HOSPITAL_COMMUNITY): Admission: RE | Admit: 2019-02-10 | Payer: Medicare Other | Source: Home / Self Care | Admitting: Orthopaedic Surgery

## 2019-02-10 SURGERY — ARTHROPLASTY, HIP, TOTAL, ANTERIOR APPROACH
Anesthesia: Spinal | Site: Hip | Laterality: Right

## 2019-02-26 ENCOUNTER — Telehealth: Payer: Self-pay

## 2019-02-26 ENCOUNTER — Other Ambulatory Visit: Payer: Self-pay

## 2019-02-26 ENCOUNTER — Encounter: Payer: Medicare Other | Admitting: *Deleted

## 2019-02-26 NOTE — Telephone Encounter (Signed)
Unable to leave a message for patient to remind of missed remote transmission.  

## 2019-03-07 ENCOUNTER — Other Ambulatory Visit: Payer: Self-pay | Admitting: Internal Medicine

## 2019-03-07 ENCOUNTER — Encounter: Payer: Self-pay | Admitting: Cardiology

## 2019-03-12 ENCOUNTER — Telehealth: Payer: Self-pay | Admitting: Internal Medicine

## 2019-03-12 NOTE — Telephone Encounter (Signed)
Ok for office visit

## 2019-03-12 NOTE — Telephone Encounter (Signed)
LVM for patient to call back to make an in office appt

## 2019-03-12 NOTE — Telephone Encounter (Signed)
Please advise 

## 2019-03-12 NOTE — Telephone Encounter (Signed)
Pt called stating she has had blood in her urine for about 3 days, no pain when urinating, no abdominal pain, amount of blood in urine has increased. No other symptoms at this time, she does not have virtual access, please advise on in office appt or phone call.

## 2019-03-20 ENCOUNTER — Telehealth (INDEPENDENT_AMBULATORY_CARE_PROVIDER_SITE_OTHER): Payer: Self-pay

## 2019-03-20 NOTE — Telephone Encounter (Signed)
Ok to let us know if any further symtpoms

## 2019-03-20 NOTE — Telephone Encounter (Signed)
Patient would like to know when she can proceed with surgery, if she can be scheduled now? If not while she waits, is there a brace that she can use or something that can be done to manage the pain? She said it is getting worse and now she can feel her bones grinding. Please advise. [phone]

## 2019-03-20 NOTE — Telephone Encounter (Signed)
Patient said she stopped drinking an herbal tea she was drinking and what she thought was blood as stopped. She apologizes.

## 2019-03-21 ENCOUNTER — Telehealth: Payer: Self-pay | Admitting: Orthopaedic Surgery

## 2019-03-21 NOTE — Telephone Encounter (Signed)
Pt called in returning Dr.xu vm.  [phone]

## 2019-03-21 NOTE — Telephone Encounter (Signed)
Called and left vm  °

## 2019-03-21 NOTE — Telephone Encounter (Signed)
Please advise. Thank you

## 2019-03-24 ENCOUNTER — Telehealth: Payer: Self-pay

## 2019-03-24 NOTE — Telephone Encounter (Signed)
Spoke with her on the phone,  she wants June 1st for surgery. Thanks.  Check to see if her PCP has cleared her.  I saw a note from klein clearing her for her heart

## 2019-03-24 NOTE — Telephone Encounter (Signed)
Left voice mail inquiring about what you might could recommend she could do for her hip in the meantime until she can have surgery.  [phone]

## 2019-03-24 NOTE — Telephone Encounter (Signed)
See message.

## 2019-03-24 NOTE — Telephone Encounter (Signed)
Please advise 

## 2019-04-08 NOTE — Telephone Encounter (Signed)
Spk with pt, scheduled for 04/28/19.

## 2019-04-10 ENCOUNTER — Telehealth: Payer: Self-pay | Admitting: *Deleted

## 2019-04-10 NOTE — Telephone Encounter (Signed)
Ortho Bundle Pre-op call and survey completed.

## 2019-04-10 NOTE — Care Plan (Signed)
RNCM contacted patient to discuss upcoming Right total hip replacement with Dr. Erlinda Hong currently scheduled for 04/28/19. Patient is willing to participate in the Ortho Bundle program and reviewed all criteria and that RNCM would be following her through surgery to 90 days post-op. Patient reports she will need a FWW as well as a elevated toilet seat/3 in 1 for home use. Her spouse will be assisting at home upon discharge from the hospital. RNCM reviewed patient's medical history, current medications and reviewed all information related to anticipated post-op instructions. Patient allowed to ask appropriate questions. Patient was extremely appreciative of all information. Discussed anticipated HHPT need post-discharge from hospital after probable 1 night stay. Ortho Bundle [facility] pre-surgery questionnaire and Hoos, Jr. Reviewed with patient. Aware of how to contact RNCM with further questions prior to and after her surgery. RNCM mailed a business card and [facility] Patient Information Sheet to patient's home.

## 2019-04-16 ENCOUNTER — Other Ambulatory Visit: Payer: Self-pay | Admitting: Internal Medicine

## 2019-04-23 NOTE — Pre-Procedure Instructions (Signed)
[id number] - [place] AT [address] [place] [address] Phone: [phone] Fax: [phone]      Your procedure is scheduled on Monday, June 8th.  Report to [facility] Main Entrance "A" at 8:00 A.M., and check in at the Admitting office.  Call this number if you have problems the morning of surgery:  [phone]  Call [phone] if you have any questions prior to your surgery date Monday-Friday 8am-4pm    Remember:  Do not eat or drink after midnight.  You may drink clear liquids until 7:00 A.M. (3 hours prior to procedure time) .   Clear liquids allowed are: Water, Non-Citrus Juices (without pulp), Carbonated Beverages, Clear Tea, Black Coffee Only, and Gatorade.   Please complete your Pre-Surgical Ensure by 7:00 A.M. Also, please drink in one sitting, Do Not Sip.      Take these medicines the morning of surgery with A SIP OF WATER  atenolol (TENORMIN)  azelastine (ASTELIN)-as needed levothyroxine (SYNTHROID)  TraMADol HCl-as needed  As of today, STOP taking any Aspirin (unless otherwise instructed by your surgeon), Aleve, Naproxen, Ibuprofen, Motrin, Advil, Goody's, BC's, all herbal medications, fish oil, and all vitamins.    The Morning of Surgery  Do not wear jewelry, make-up or nail polish.  Do not wear lotions, powders, or perfumes, or deodorant  Do not shave 48 hours prior to surgery.  Men may shave face and neck.  Do not bring valuables to the hospital.  [facility] is not responsible for any belongings or valuables.  If you are a smoker, DO NOT Smoke 24 hours prior to surgery IF you wear a CPAP at night please bring your mask, tubing, and machine the morning of surgery   Remember that you must have someone to transport you home after your surgery, and remain with you for 24 hours if you are discharged the same day.   Contacts, glasses, hearing aids, dentures or  bridgework may not be worn into surgery.    Leave your suitcase in the car.  After surgery it may be brought to your room.  For patients admitted to the hospital, discharge time will be determined by your treatment team.  Patients discharged the day of surgery will not be allowed to drive home.    Special instructions:   [facility]- Preparing For Surgery  Before surgery, you can play an important role. Because skin is not sterile, your skin needs to be as free of germs as possible. You can reduce the number of germs on your skin by washing with CHG (chlorahexidine gluconate) Soap before surgery.  CHG is an antiseptic cleaner which kills germs and bonds with the skin to continue killing germs even after washing.    Oral Hygiene is also important to reduce your risk of infection.  Remember - BRUSH YOUR TEETH THE MORNING OF SURGERY WITH YOUR REGULAR TOOTHPASTE  Please do not use if you have an allergy to CHG or antibacterial soaps. If your skin becomes reddened/irritated stop using the CHG.  Do not shave (including legs and underarms) for at least 48 hours prior to first CHG shower. It is OK to shave your face.  Please follow these instructions carefully.   1. Shower the NIGHT BEFORE SURGERY and the MORNING OF SURGERY with CHG Soap.   2. If you chose to wash your hair, wash your hair first as usual with your normal shampoo.  3.  After you shampoo, rinse your hair and body thoroughly to remove the shampoo.  4. Use CHG as you would any other liquid soap. You can apply CHG directly to the skin and wash gently with a scrungie or a clean washcloth.   5. Apply the CHG Soap to your body ONLY FROM THE NECK DOWN.  Do not use on open wounds or open sores. Avoid contact with your eyes, ears, mouth and genitals (private parts). Wash Face and genitals (private parts)  with your normal soap.   6. Wash thoroughly, paying special attention to the area where your surgery will be  performed.  7. Thoroughly rinse your body with warm water from the neck down.  8. DO NOT shower/wash with your normal soap after using and rinsing off the CHG Soap.  9. Pat yourself dry with a CLEAN TOWEL.  10. Wear CLEAN PAJAMAS to bed the night before surgery, wear comfortable clothes the morning of surgery  11. Place CLEAN SHEETS on your bed the night of your first shower and DO NOT SLEEP WITH PETS.    Day of Surgery:  Do not apply any deodorants/lotions.  Please wear clean clothes to the hospital/surgery center.   Remember to brush your teeth WITH YOUR REGULAR TOOTHPASTE.   Please read over the following fact sheets that you were given.

## 2019-04-24 ENCOUNTER — Encounter (HOSPITAL_COMMUNITY)
Admission: RE | Admit: 2019-04-24 | Discharge: 2019-04-24 | Disposition: A | Discharge status: Home / Self Care | Payer: Medicare Other | Source: Ambulatory Visit | Attending: Orthopaedic Surgery | Admitting: Orthopaedic Surgery

## 2019-04-24 ENCOUNTER — Encounter (HOSPITAL_COMMUNITY): Payer: Self-pay

## 2019-04-24 ENCOUNTER — Other Ambulatory Visit (HOSPITAL_COMMUNITY)
Admission: RE | Admit: 2019-04-24 | Discharge: 2019-04-24 | Disposition: A | Discharge status: Home / Self Care | Payer: Medicare Other | Source: Ambulatory Visit | Attending: Orthopaedic Surgery | Admitting: Orthopaedic Surgery

## 2019-04-24 ENCOUNTER — Other Ambulatory Visit: Payer: Self-pay

## 2019-04-24 ENCOUNTER — Ambulatory Visit (HOSPITAL_COMMUNITY)
Admission: RE | Admit: 2019-04-24 | Discharge: 2019-04-24 | Disposition: A | Discharge status: Home / Self Care | Payer: Medicare Other | Source: Ambulatory Visit | Attending: Physician Assistant | Admitting: Physician Assistant

## 2019-04-24 DIAGNOSIS — R918 Other nonspecific abnormal finding of lung field: Secondary | ICD-10-CM | POA: Diagnosis not present

## 2019-04-24 DIAGNOSIS — M1611 Unilateral primary osteoarthritis, right hip: Secondary | ICD-10-CM

## 2019-04-24 DIAGNOSIS — Z1159 Encounter for screening for other viral diseases: Secondary | ICD-10-CM | POA: Insufficient documentation

## 2019-04-24 DIAGNOSIS — Z9011 Acquired absence of right breast and nipple: Secondary | ICD-10-CM | POA: Diagnosis not present

## 2019-04-24 DIAGNOSIS — Z01818 Encounter for other preprocedural examination: Secondary | ICD-10-CM | POA: Diagnosis not present

## 2019-04-24 LAB — CBC WITH DIFFERENTIAL/PLATELET
Abs Immature Granulocytes: 0.01 10*3/uL (ref 0.00–0.07)
Basophils Absolute: 0 10*3/uL (ref 0.0–0.1)
Basophils Relative: 1 %
Eosinophils Absolute: 0.2 10*3/uL (ref 0.0–0.5)
Eosinophils Relative: 3 %
HCT: 35.2 % — ABNORMAL LOW (ref 36.0–46.0)
Hemoglobin: 11.1 g/dL — ABNORMAL LOW (ref 12.0–15.0)
Immature Granulocytes: 0 %
Lymphocytes Relative: 25 %
Lymphs Abs: 1.4 10*3/uL (ref 0.7–4.0)
MCH: 30.1 pg (ref 26.0–34.0)
MCHC: 31.5 g/dL (ref 30.0–36.0)
MCV: 95.4 fL (ref 80.0–100.0)
Monocytes Absolute: 0.5 10*3/uL (ref 0.1–1.0)
Monocytes Relative: 9 %
Neutro Abs: 3.4 10*3/uL (ref 1.7–7.7)
Neutrophils Relative %: 62 %
Platelets: 153 10*3/uL (ref 150–400)
RBC: 3.69 MIL/uL — ABNORMAL LOW (ref 3.87–5.11)
RDW: 12.8 % (ref 11.5–15.5)
WBC: 5.5 10*3/uL (ref 4.0–10.5)
nRBC: 0 % (ref 0.0–0.2)

## 2019-04-24 LAB — TYPE AND SCREEN
ABO/RH(D): A NEG
Antibody Screen: NEGATIVE

## 2019-04-24 LAB — SURGICAL PCR SCREEN
MRSA, PCR: NEGATIVE
Staphylococcus aureus: POSITIVE — AB

## 2019-04-24 LAB — COMPREHENSIVE METABOLIC PANEL
ALT: 14 U/L (ref 0–44)
AST: 20 U/L (ref 15–41)
Albumin: 4 g/dL (ref 3.5–5.0)
Alkaline Phosphatase: 73 U/L (ref 38–126)
Anion gap: 8 (ref 5–15)
BUN: 23 mg/dL (ref 8–23)
CO2: 27 mmol/L (ref 22–32)
Calcium: 9.7 mg/dL (ref 8.9–10.3)
Chloride: 106 mmol/L (ref 98–111)
Creatinine, Ser: 1.01 mg/dL — ABNORMAL HIGH (ref 0.44–1.00)
GFR calc Af Amer: >60 mL/min (ref >60)
GFR calc non Af Amer: 52 mL/min — ABNORMAL LOW (ref >60)
Glucose, Bld: 72 mg/dL (ref 70–99)
Potassium: 3.8 mmol/L (ref 3.5–5.1)
Sodium: 141 mmol/L (ref 135–145)
Total Bilirubin: 1.2 mg/dL (ref 0.3–1.2)
Total Protein: 6.2 g/dL — ABNORMAL LOW (ref 6.5–8.1)

## 2019-04-24 LAB — ABO/RH: ABO/RH(D): A NEG

## 2019-04-24 LAB — PROTIME-INR
INR: 1 (ref 0.8–1.2)
Prothrombin Time: 13.5 seconds (ref 11.4–15.2)

## 2019-04-24 LAB — APTT: aPTT: 31 seconds (ref 24–36)

## 2019-04-24 NOTE — Progress Notes (Addendum)
PCP - Dr. Cathlean Cower  Cardiologist - Dr. Caryl Comes  Chest x-ray - 04-24-19(epic)  EKG - 01-07-19(Epic)  Stress Test - 10-03-05  ECHO - Not on file.Pt. unaware  Cardiac Cath -   AICD- PM-Yes.  [employer]. Email sent to [employer]. Physician orders faxed. LOOP-  Sleep Study - denies CPAP -   LABS-PCR,CBC with diff,CMP,PT-INR ,T/S, APTT  ASA-denies  ERAS-Y. Ensure given with instructions  HA1C-NA Fasting Blood Sugar -  Checks Blood Sugar _____ times a day  Anesthesia-Y.  Pacemaker.  Pt denies having chest pain, sob, or fever at this time. All instructions explained to the pt, with a verbal understanding of the material. Pt agrees to go over the instructions while at home for a better understanding. The opportunity to ask questions was provided.

## 2019-04-24 NOTE — Progress Notes (Signed)
  Coronavirus Screening  Scheduled for COVID test- Have you experienced the following symptoms:  Cough yes/no: No Fever (>100.85F)  yes/no: No Runny nose yes/no: No Sore throat yes/no: No Difficulty breathing/shortness of breath  yes/no: No Loss of smell, taste-No Have you or a family member traveled in the last 14 days and where? yes/no: No   If the patient indicates "YES" to the above questions, their PAT will be rescheduled to limit the exposure to others and, the surgeon will be notified. THE PATIENT WILL NEED TO BE ASYMPTOMATIC FOR 14 DAYS.   If the patient is not experiencing any of these symptoms, the PAT nurse will instruct them to NOT bring anyone with them to their appointment since they may have these symptoms or traveled as well.   Please remind your patients and families that hospital visitation restrictions are in effect and the importance of the restrictions.

## 2019-04-25 ENCOUNTER — Telehealth: Payer: Self-pay

## 2019-04-25 LAB — NOVEL CORONAVIRUS, NAA (HOSP ORDER, SEND-OUT TO REF LAB; TAT 18-24 HRS): SARS-CoV-2, NAA: NOT DETECTED

## 2019-04-25 MED ORDER — TRANEXAMIC ACID 1000 MG/10ML IV SOLN
2000.0000 mg | INTRAVENOUS | Status: AC
  Administered 2019-04-28: 2000 mg via TOPICAL
  Filled 2019-04-25: qty 20

## 2019-04-25 NOTE — Anesthesia Preprocedure Evaluation (Addendum)
Anesthesia Evaluation  Patient identified by MRN, date of birth, ID band Patient awake    Reviewed: Allergy & Precautions, H&P , NPO status , Patient's Chart, lab work & pertinent test results  Airway Mallampati: II  TM Distance: >3 FB Neck ROM: Full    Dental no notable dental hx. (+) Teeth Intact   Pulmonary neg pulmonary ROS, former smoker,    Pulmonary exam normal breath sounds clear to auscultation       Cardiovascular Exercise Tolerance: Good hypertension, Pt. on medications and Pt. on home beta blockers + DVT  Normal cardiovascular exam+ dysrhythmias + pacemaker  Rhythm:Regular Rate:Normal  Pacemaker for complete heart block  EKG 1/20  AV paced   Neuro/Psych    GI/Hepatic GERD  Medicated,  Endo/Other  Hypothyroidism   Renal/GU Cr 1.1 K+ 3.8     Musculoskeletal  (+) Arthritis ,   Abdominal   Peds  Hematology  (+) Blood dyscrasia, anemia , Hgb 11.1 Plt 153   Anesthesia Other Findings Hx of breast CA  Reproductive/Obstetrics                          Anesthesia Physical Anesthesia Plan  ASA: III  Anesthesia Plan: Spinal   Post-op Pain Management:    Induction: Intravenous  PONV Risk Score and Plan: 3 and Treatment may vary due to age or medical condition, Ondansetron and Dexamethasone  Airway Management Planned: Natural Airway and Simple Face Mask  Additional Equipment:   Intra-op Plan:   Post-operative Plan:   Informed Consent: I have reviewed the patients History and Physical, chart, labs and discussed the procedure including the risks, benefits and alternatives for the proposed anesthesia with the patient or authorized representative who has indicated his/her understanding and acceptance.     Dental advisory given  Plan Discussed with: CRNA  Anesthesia Plan Comments: (Follows with Dr. Caryl Comes for CHB s/p PPM. Last seen 01/07/19 and cleared for surgery at that time  "Stable for surgery   Cardiovascular risk should be acceptable." ICD form on pt chart.)     Anesthesia Quick Evaluation

## 2019-04-25 NOTE — Telephone Encounter (Signed)
Called patient and advised her if she was aware of mupirocin and she states she did not know  she will call pharmacy and if they do not have it then we can send it in to her pharm. She will call us back.

## 2019-04-25 NOTE — Progress Notes (Signed)
Will you make sure they have called in mupirocin for her please

## 2019-04-26 MED ORDER — TRANEXAMIC ACID-NACL 1000-0.7 MG/100ML-% IV SOLN
1000.0000 mg | INTRAVENOUS | Status: AC
  Administered 2019-04-28: 1000 mg via INTRAVENOUS
  Filled 2019-04-26 (×3): qty 100

## 2019-04-28 ENCOUNTER — Ambulatory Visit (HOSPITAL_COMMUNITY): Payer: Medicare Other

## 2019-04-28 ENCOUNTER — Encounter (HOSPITAL_COMMUNITY): Payer: Self-pay

## 2019-04-28 ENCOUNTER — Encounter (HOSPITAL_COMMUNITY)
Admission: RE | Disposition: A | Discharge status: Home / Self Care | Payer: Self-pay | Source: Home / Self Care | Attending: Orthopaedic Surgery

## 2019-04-28 ENCOUNTER — Ambulatory Visit (HOSPITAL_COMMUNITY): Payer: Medicare Other | Admitting: Physician Assistant

## 2019-04-28 ENCOUNTER — Ambulatory Visit (HOSPITAL_COMMUNITY): Payer: Medicare Other | Admitting: Anesthesiology

## 2019-04-28 ENCOUNTER — Other Ambulatory Visit: Payer: Self-pay

## 2019-04-28 ENCOUNTER — Observation Stay (HOSPITAL_COMMUNITY): Payer: Medicare Other

## 2019-04-28 ENCOUNTER — Observation Stay (HOSPITAL_COMMUNITY)
Admission: RE | Admit: 2019-04-28 | Discharge: 2019-04-29 | Disposition: A | Discharge status: Home / Self Care | Payer: Medicare Other | Attending: Orthopaedic Surgery | Admitting: Orthopaedic Surgery

## 2019-04-28 DIAGNOSIS — I10 Essential (primary) hypertension: Secondary | ICD-10-CM | POA: Diagnosis not present

## 2019-04-28 DIAGNOSIS — Z471 Aftercare following joint replacement surgery: Secondary | ICD-10-CM | POA: Diagnosis not present

## 2019-04-28 DIAGNOSIS — E785 Hyperlipidemia, unspecified: Secondary | ICD-10-CM | POA: Insufficient documentation

## 2019-04-28 DIAGNOSIS — Z86718 Personal history of other venous thrombosis and embolism: Secondary | ICD-10-CM | POA: Diagnosis not present

## 2019-04-28 DIAGNOSIS — Z79899 Other long term (current) drug therapy: Secondary | ICD-10-CM | POA: Insufficient documentation

## 2019-04-28 DIAGNOSIS — Z950 Presence of cardiac pacemaker: Secondary | ICD-10-CM | POA: Diagnosis not present

## 2019-04-28 DIAGNOSIS — Z87891 Personal history of nicotine dependence: Secondary | ICD-10-CM | POA: Insufficient documentation

## 2019-04-28 DIAGNOSIS — Z96649 Presence of unspecified artificial hip joint: Secondary | ICD-10-CM

## 2019-04-28 DIAGNOSIS — K219 Gastro-esophageal reflux disease without esophagitis: Secondary | ICD-10-CM | POA: Insufficient documentation

## 2019-04-28 DIAGNOSIS — Z419 Encounter for procedure for purposes other than remedying health state, unspecified: Secondary | ICD-10-CM

## 2019-04-28 DIAGNOSIS — J309 Allergic rhinitis, unspecified: Secondary | ICD-10-CM | POA: Insufficient documentation

## 2019-04-28 DIAGNOSIS — Z96641 Presence of right artificial hip joint: Secondary | ICD-10-CM | POA: Diagnosis not present

## 2019-04-28 DIAGNOSIS — E039 Hypothyroidism, unspecified: Secondary | ICD-10-CM | POA: Insufficient documentation

## 2019-04-28 DIAGNOSIS — I442 Atrioventricular block, complete: Secondary | ICD-10-CM | POA: Diagnosis not present

## 2019-04-28 DIAGNOSIS — M1611 Unilateral primary osteoarthritis, right hip: Secondary | ICD-10-CM | POA: Diagnosis not present

## 2019-04-28 DIAGNOSIS — D62 Acute posthemorrhagic anemia: Secondary | ICD-10-CM | POA: Diagnosis not present

## 2019-04-28 DIAGNOSIS — Z853 Personal history of malignant neoplasm of breast: Secondary | ICD-10-CM | POA: Diagnosis not present

## 2019-04-28 HISTORY — PX: TOTAL HIP ARTHROPLASTY: SHX124

## 2019-04-28 HISTORY — DX: Presence of unspecified artificial hip joint: Z96.649

## 2019-04-28 SURGERY — ARTHROPLASTY, HIP, TOTAL, ANTERIOR APPROACH
Anesthesia: Spinal | Site: Hip | Laterality: Right

## 2019-04-28 MED ORDER — SODIUM CHLORIDE 0.9 % IV SOLN
INTRAVENOUS | Status: DC
  Administered 2019-04-28 – 2019-04-29 (×2): via INTRAVENOUS

## 2019-04-28 MED ORDER — SODIUM CHLORIDE 0.9 % IR SOLN
Status: DC | PRN
  Administered 2019-04-28: 3000 mL

## 2019-04-28 MED ORDER — POLYETHYLENE GLYCOL 3350 17 G PO PACK: 17.0000 g | PACK | Freq: Every day | ORAL | Status: DC | PRN

## 2019-04-28 MED ORDER — ACETAMINOPHEN 500 MG PO TABS
500.0000 mg | ORAL_TABLET | Freq: Four times a day (QID) | ORAL | Status: AC
  Administered 2019-04-28 – 2019-04-29 (×4): 500 mg via ORAL
  Filled 2019-04-28 (×4): qty 1

## 2019-04-28 MED ORDER — LIDOCAINE 2% (20 MG/ML) 5 ML SYRINGE
INTRAMUSCULAR | Status: DC | PRN
  Administered 2019-04-28: 50 mg via INTRAVENOUS

## 2019-04-28 MED ORDER — HYDROCODONE-ACETAMINOPHEN 5-325 MG PO TABS: 1.0000 | ORAL_TABLET | ORAL | Status: DC | PRN

## 2019-04-28 MED ORDER — KETOROLAC TROMETHAMINE 15 MG/ML IJ SOLN
7.5000 mg | Freq: Four times a day (QID) | INTRAMUSCULAR | Status: AC
  Administered 2019-04-28 – 2019-04-29 (×4): 7.5 mg via INTRAVENOUS
  Filled 2019-04-28 (×3): qty 1

## 2019-04-28 MED ORDER — VANCOMYCIN HCL 1000 MG IV SOLR
INTRAVENOUS | Status: AC
  Filled 2019-04-28: qty 1000

## 2019-04-28 MED ORDER — VANCOMYCIN HCL 1 G IV SOLR
INTRAVENOUS | Status: DC | PRN
  Administered 2019-04-28: 1000 mg via TOPICAL

## 2019-04-28 MED ORDER — ONDANSETRON HCL 4 MG/2ML IJ SOLN
INTRAMUSCULAR | Status: DC | PRN
  Administered 2019-04-28: 4 mg via INTRAVENOUS

## 2019-04-28 MED ORDER — PHENOL 1.4 % MT LIQD: 1.0000 | OROMUCOSAL | Status: DC | PRN

## 2019-04-28 MED ORDER — MAGNESIUM CITRATE PO SOLN: 1.0000 | Freq: Once | ORAL | Status: DC | PRN

## 2019-04-28 MED ORDER — ASPIRIN EC 81 MG PO TBEC: 81.0000 mg | DELAYED_RELEASE_TABLET | Freq: Two times a day (BID) | ORAL | 0 refills | Status: DC

## 2019-04-28 MED ORDER — ONDANSETRON HCL 4 MG PO TABS: 4.0000 mg | ORAL_TABLET | Freq: Three times a day (TID) | ORAL | 0 refills | Status: DC | PRN

## 2019-04-28 MED ORDER — LACTATED RINGERS IV SOLN
INTRAVENOUS | Status: DC
  Administered 2019-04-28: 08:00:00 via INTRAVENOUS

## 2019-04-28 MED ORDER — FENTANYL CITRATE (PF) 100 MCG/2ML IJ SOLN
INTRAMUSCULAR | Status: AC
  Filled 2019-04-28: qty 2

## 2019-04-28 MED ORDER — MORPHINE SULFATE (PF) 2 MG/ML IV SOLN: 0.5000 mg | INTRAVENOUS | Status: DC | PRN

## 2019-04-28 MED ORDER — CALCIUM CARBONATE-VITAMIN D 500-200 MG-UNIT PO TABS
1.0000 | ORAL_TABLET | Freq: Every day | ORAL | Status: DC
  Filled 2019-04-28: qty 1

## 2019-04-28 MED ORDER — CEFAZOLIN SODIUM-DEXTROSE 2-4 GM/100ML-% IV SOLN
INTRAVENOUS | Status: AC
  Filled 2019-04-28: qty 100

## 2019-04-28 MED ORDER — CEFAZOLIN SODIUM-DEXTROSE 2-4 GM/100ML-% IV SOLN
2.0000 g | Freq: Three times a day (TID) | INTRAVENOUS | Status: DC
  Filled 2019-04-28 (×2): qty 100

## 2019-04-28 MED ORDER — LEVOTHYROXINE SODIUM 112 MCG PO TABS
112.0000 ug | ORAL_TABLET | Freq: Every day | ORAL | Status: DC
  Administered 2019-04-29: 112 ug via ORAL
  Filled 2019-04-28: qty 1

## 2019-04-28 MED ORDER — DOCUSATE SODIUM 100 MG PO CAPS
100.0000 mg | ORAL_CAPSULE | Freq: Two times a day (BID) | ORAL | Status: DC
  Administered 2019-04-28 – 2019-04-29 (×2): 100 mg via ORAL
  Filled 2019-04-28 (×2): qty 1

## 2019-04-28 MED ORDER — ONDANSETRON HCL 4 MG/2ML IJ SOLN: 4.0000 mg | Freq: Once | INTRAMUSCULAR | Status: DC | PRN

## 2019-04-28 MED ORDER — ZOLPIDEM TARTRATE 5 MG PO TABS
5.0000 mg | ORAL_TABLET | Freq: Every day | ORAL | Status: DC
  Administered 2019-04-28: 5 mg via ORAL
  Filled 2019-04-28: qty 1

## 2019-04-28 MED ORDER — PROMETHAZINE HCL 25 MG PO TABS: 25.0000 mg | ORAL_TABLET | Freq: Four times a day (QID) | ORAL | 1 refills | Status: DC | PRN

## 2019-04-28 MED ORDER — CEFAZOLIN SODIUM-DEXTROSE 1-4 GM/50ML-% IV SOLN
1.0000 g | Freq: Three times a day (TID) | INTRAVENOUS | Status: AC
  Administered 2019-04-28 (×2): 1 g via INTRAVENOUS
  Filled 2019-04-28 (×2): qty 50

## 2019-04-28 MED ORDER — ASPIRIN 81 MG PO CHEW
81.0000 mg | CHEWABLE_TABLET | Freq: Two times a day (BID) | ORAL | Status: DC
  Administered 2019-04-28 – 2019-04-29 (×2): 81 mg via ORAL
  Filled 2019-04-28 (×2): qty 1

## 2019-04-28 MED ORDER — DIPHENHYDRAMINE HCL 12.5 MG/5ML PO ELIX: 25.0000 mg | ORAL_SOLUTION | ORAL | Status: DC | PRN

## 2019-04-28 MED ORDER — ONDANSETRON HCL 4 MG PO TABS: 4.0000 mg | ORAL_TABLET | Freq: Four times a day (QID) | ORAL | Status: DC | PRN

## 2019-04-28 MED ORDER — KETOROLAC TROMETHAMINE 15 MG/ML IJ SOLN
INTRAMUSCULAR | Status: AC
  Administered 2019-04-28: 7.5 mg via INTRAVENOUS
  Filled 2019-04-28: qty 1

## 2019-04-28 MED ORDER — METHOCARBAMOL 500 MG PO TABS
ORAL_TABLET | ORAL | Status: AC
  Administered 2019-04-28: 500 mg via ORAL
  Filled 2019-04-28: qty 1

## 2019-04-28 MED ORDER — HYDROCODONE-ACETAMINOPHEN 7.5-325 MG PO TABS
1.0000 | ORAL_TABLET | ORAL | Status: DC | PRN
  Administered 2019-04-28: 1 via ORAL

## 2019-04-28 MED ORDER — CHLORHEXIDINE GLUCONATE 4 % EX LIQD: 60.0000 mL | Freq: Once | CUTANEOUS | Status: DC

## 2019-04-28 MED ORDER — ACETAMINOPHEN 10 MG/ML IV SOLN: 1000.0000 mg | Freq: Once | INTRAVENOUS | Status: DC | PRN

## 2019-04-28 MED ORDER — FENTANYL CITRATE (PF) 100 MCG/2ML IJ SOLN
25.0000 ug | INTRAMUSCULAR | Status: DC | PRN
  Administered 2019-04-28: 50 ug via INTRAVENOUS

## 2019-04-28 MED ORDER — ATORVASTATIN CALCIUM 10 MG PO TABS
10.0000 mg | ORAL_TABLET | Freq: Every day | ORAL | Status: DC
  Administered 2019-04-28: 10 mg via ORAL
  Filled 2019-04-28: qty 1

## 2019-04-28 MED ORDER — ALUM & MAG HYDROXIDE-SIMETH 200-200-20 MG/5ML PO SUSP: 30.0000 mL | ORAL | Status: DC | PRN

## 2019-04-28 MED ORDER — 0.9 % SODIUM CHLORIDE (POUR BTL) OPTIME
TOPICAL | Status: DC | PRN
  Administered 2019-04-28: 08:00:00 1000 mL

## 2019-04-28 MED ORDER — HYDROCODONE-ACETAMINOPHEN 7.5-325 MG PO TABS
ORAL_TABLET | ORAL | Status: AC
  Administered 2019-04-28: 1 via ORAL
  Filled 2019-04-28: qty 1

## 2019-04-28 MED ORDER — PROPOFOL 500 MG/50ML IV EMUL
INTRAVENOUS | Status: DC | PRN
  Administered 2019-04-28: 100 ug/kg/min via INTRAVENOUS
  Administered 2019-04-28: 90 ug/kg/min via INTRAVENOUS

## 2019-04-28 MED ORDER — POVIDONE-IODINE 10 % EX SWAB: 2.0000 "application " | Freq: Once | CUTANEOUS | Status: DC

## 2019-04-28 MED ORDER — TRANEXAMIC ACID-NACL 1000-0.7 MG/100ML-% IV SOLN
1000.0000 mg | Freq: Once | INTRAVENOUS | Status: AC
  Administered 2019-04-28: 1000 mg via INTRAVENOUS
  Filled 2019-04-28 (×2): qty 100

## 2019-04-28 MED ORDER — CEFAZOLIN SODIUM-DEXTROSE 2-4 GM/100ML-% IV SOLN
2.0000 g | INTRAVENOUS | Status: AC
  Administered 2019-04-28: 2 g via INTRAVENOUS

## 2019-04-28 MED ORDER — ONDANSETRON HCL 4 MG/2ML IJ SOLN: 4.0000 mg | Freq: Four times a day (QID) | INTRAMUSCULAR | Status: DC | PRN

## 2019-04-28 MED ORDER — ATENOLOL 50 MG PO TABS
50.0000 mg | ORAL_TABLET | Freq: Every day | ORAL | Status: DC
  Administered 2019-04-29: 50 mg via ORAL
  Filled 2019-04-28: qty 1

## 2019-04-28 MED ORDER — MENTHOL 3 MG MT LOZG: 1.0000 | LOZENGE | OROMUCOSAL | Status: DC | PRN

## 2019-04-28 MED ORDER — METHOCARBAMOL 500 MG PO TABS: 500.0000 mg | ORAL_TABLET | Freq: Four times a day (QID) | ORAL | 2 refills | Status: DC | PRN

## 2019-04-28 MED ORDER — SORBITOL 70 % SOLN: 30.0000 mL | Freq: Every day | Status: DC | PRN

## 2019-04-28 MED ORDER — ACETAMINOPHEN 325 MG PO TABS: 325.0000 mg | ORAL_TABLET | Freq: Four times a day (QID) | ORAL | Status: DC | PRN

## 2019-04-28 MED ORDER — ENALAPRIL MALEATE 10 MG PO TABS
10.0000 mg | ORAL_TABLET | Freq: Every day | ORAL | Status: DC
  Administered 2019-04-29: 10 mg via ORAL
  Filled 2019-04-28 (×2): qty 1

## 2019-04-28 MED ORDER — METHOCARBAMOL 500 MG PO TABS
500.0000 mg | ORAL_TABLET | Freq: Four times a day (QID) | ORAL | Status: DC | PRN
  Administered 2019-04-28: 500 mg via ORAL

## 2019-04-28 MED ORDER — AZELASTINE HCL 0.1 % NA SOLN
2.0000 | Freq: Every day | NASAL | Status: DC | PRN
  Filled 2019-04-28: qty 30

## 2019-04-28 MED ORDER — BUPIVACAINE IN DEXTROSE 0.75-8.25 % IT SOLN
INTRATHECAL | Status: DC | PRN
  Administered 2019-04-28: 12 mg via INTRATHECAL

## 2019-04-28 MED ORDER — METOCLOPRAMIDE HCL 5 MG PO TABS: 5.0000 mg | ORAL_TABLET | Freq: Three times a day (TID) | ORAL | Status: DC | PRN

## 2019-04-28 MED ORDER — DEXAMETHASONE SODIUM PHOSPHATE 10 MG/ML IJ SOLN
10.0000 mg | Freq: Once | INTRAMUSCULAR | Status: AC
  Administered 2019-04-29: 10 mg via INTRAVENOUS
  Filled 2019-04-28: qty 1

## 2019-04-28 MED ORDER — SENNOSIDES-DOCUSATE SODIUM 8.6-50 MG PO TABS: 1.0000 | ORAL_TABLET | Freq: Every evening | ORAL | 1 refills | Status: DC | PRN

## 2019-04-28 MED ORDER — METOCLOPRAMIDE HCL 5 MG/ML IJ SOLN: 5.0000 mg | Freq: Three times a day (TID) | INTRAMUSCULAR | Status: DC | PRN

## 2019-04-28 MED ORDER — METHOCARBAMOL 1000 MG/10ML IJ SOLN
500.0000 mg | Freq: Four times a day (QID) | INTRAVENOUS | Status: DC | PRN
  Filled 2019-04-28: qty 5

## 2019-04-28 MED ORDER — SODIUM CHLORIDE 0.9 % IV SOLN
INTRAVENOUS | Status: DC | PRN
  Administered 2019-04-28: 40 ug/min via INTRAVENOUS

## 2019-04-28 MED ORDER — HYDROCODONE-ACETAMINOPHEN 10-325 MG PO TABS: 1.0000 | ORAL_TABLET | Freq: Three times a day (TID) | ORAL | 0 refills | Status: DC | PRN

## 2019-04-28 SURGICAL SUPPLY — 60 items
BAG DECANTER FOR FLEXI CONT (MISCELLANEOUS) ×3 IMPLANT
BALL HIP ARTICU EZE 36 8.5 (Hips) ×1 IMPLANT
CELLS DAT CNTRL 66122 CELL SVR (MISCELLANEOUS) IMPLANT
COVER PERINEAL POST (MISCELLANEOUS) ×3 IMPLANT
COVER SURGICAL LIGHT HANDLE (MISCELLANEOUS) ×3 IMPLANT
COVER WAND RF STERILE (DRAPES) ×3 IMPLANT
DRAPE C-ARM 42X72 X-RAY (DRAPES) ×3 IMPLANT
DRAPE POUCH INSTRU U-SHP 10X18 (DRAPES) ×3 IMPLANT
DRAPE STERI IOBAN 125X83 (DRAPES) ×3 IMPLANT
DRAPE U-SHAPE 47X51 STRL (DRAPES) ×6 IMPLANT
DRSG AQUACEL AG ADV 3.5X10 (GAUZE/BANDAGES/DRESSINGS) ×5 IMPLANT
DURAPREP 26ML APPLICATOR (WOUND CARE) ×6 IMPLANT
ELECT BLADE 4.0 EZ CLEAN MEGAD (MISCELLANEOUS) ×3
ELECT REM PT RETURN 9FT ADLT (ELECTROSURGICAL) ×3
ELECTRODE BLDE 4.0 EZ CLN MEGD (MISCELLANEOUS) ×1 IMPLANT
ELECTRODE REM PT RTRN 9FT ADLT (ELECTROSURGICAL) ×1 IMPLANT
GLOVE BIOGEL PI IND STRL 7.0 (GLOVE) ×1 IMPLANT
GLOVE BIOGEL PI INDICATOR 7.0 (GLOVE) ×2
GLOVE ECLIPSE 7.0 STRL STRAW (GLOVE) ×6 IMPLANT
GLOVE SKINSENSE NS SZ7.5 (GLOVE) ×2
GLOVE SKINSENSE STRL SZ7.5 (GLOVE) ×1 IMPLANT
GLOVE SURG SYN 7.5  E (GLOVE) ×8
GLOVE SURG SYN 7.5 E (GLOVE) ×4 IMPLANT
GLOVE SURG SYN 7.5 PF PI (GLOVE) ×4 IMPLANT
GOWN SRG XL XLNG 56XLVL 4 (GOWN DISPOSABLE) ×1 IMPLANT
GOWN STRL NON-REIN XL XLG LVL4 (GOWN DISPOSABLE) ×3
GOWN STRL REUS W/ TWL LRG LVL3 (GOWN DISPOSABLE) IMPLANT
GOWN STRL REUS W/ TWL XL LVL3 (GOWN DISPOSABLE) ×1 IMPLANT
GOWN STRL REUS W/TWL LRG LVL3 (GOWN DISPOSABLE)
GOWN STRL REUS W/TWL XL LVL3 (GOWN DISPOSABLE) ×3
HANDPIECE INTERPULSE COAX TIP (DISPOSABLE) ×3
HIP BALL ARTICU EZE 36 8.5 (Hips) ×3 IMPLANT
HOOD PEEL AWAY FLYTE STAYCOOL (MISCELLANEOUS) ×6 IMPLANT
IV NS IRRIG 3000ML ARTHROMATIC (IV SOLUTION) ×3 IMPLANT
KIT BASIN OR (CUSTOM PROCEDURE TRAY) ×3 IMPLANT
LINER NEUTRAL 52X36MM PLUS 4 (Liner) ×2 IMPLANT
MARKER SKIN DUAL TIP RULER LAB (MISCELLANEOUS) ×3 IMPLANT
NDL SPNL 18GX3.5 QUINCKE PK (NEEDLE) ×1 IMPLANT
NEEDLE SPNL 18GX3.5 QUINCKE PK (NEEDLE) ×3 IMPLANT
PACK TOTAL JOINT (CUSTOM PROCEDURE TRAY) ×3 IMPLANT
PACK UNIVERSAL I (CUSTOM PROCEDURE TRAY) ×3 IMPLANT
PIN SECTOR W/GRIP ACE CUP 52MM (Hips) ×2 IMPLANT
RETRACTOR WND ALEXIS 18 MED (MISCELLANEOUS) IMPLANT
RTRCTR WOUND ALEXIS 18CM MED (MISCELLANEOUS)
SAW OSC TIP CART 19.5X105X1.3 (SAW) ×3 IMPLANT
SCREW 6.5MMX25MM (Screw) ×2 IMPLANT
SET HNDPC FAN SPRY TIP SCT (DISPOSABLE) ×1 IMPLANT
STAPLER VISISTAT 35W (STAPLE) IMPLANT
STEM FEMORAL SZ 5MM STD ACTIS (Stem) ×2 IMPLANT
SUT ETHIBOND 2 V 37 (SUTURE) ×3 IMPLANT
SUT ETHILON 2 0 PSLX (SUTURE) ×2 IMPLANT
SUT ETHILON 3 0 FSL (SUTURE) ×2 IMPLANT
SUT VIC AB 1 CTX 36 (SUTURE) ×3
SUT VIC AB 1 CTX36XBRD ANBCTR (SUTURE) ×1 IMPLANT
SUT VIC AB 2-0 CT1 27 (SUTURE) ×9
SUT VIC AB 2-0 CT1 TAPERPNT 27 (SUTURE) ×2 IMPLANT
SYRINGE 60CC LL (MISCELLANEOUS) ×3 IMPLANT
TOWEL OR 17X26 10 PK STRL BLUE (TOWEL DISPOSABLE) ×3 IMPLANT
TRAY CATH 16FR W/PLASTIC CATH (SET/KITS/TRAYS/PACK) IMPLANT
YANKAUER SUCT BULB TIP NO VENT (SUCTIONS) ×3 IMPLANT

## 2019-04-28 NOTE — Op Note (Signed)
RIGHT TOTAL HIP ARTHROPLASTY ANTERIOR APPROACH  Procedure Note Danielle [name]   [id number]  Pre-op Diagnosis: right hip degenerative joint disease     Post-op Diagnosis: same   Operative Procedures  1. Total hip replacement; Right hip; uncemented [id number]   Personnel  Surgeon(s): Leandrew Koyanagi, MD  Assist: Madalyn Rob, PA-C; necessary for the timely completion of procedure and due to complexity of procedure.   Anesthesia: spinal  Prosthesis: Depuy Acetabulum: Pinnacle 52 mm Femur: Actis 5 STD Head: 36 mm size: +8.5 Liner: +4 neutral Bearing Type: metal on poly  Total Hip Arthroplasty (Anterior Approach) Op Note:  After informed consent was obtained and the operative extremity marked in the holding area, the patient was brought back to the operating room and placed supine on the HANA table. Next, the operative extremity was prepped and draped in normal sterile fashion. Surgical timeout occurred verifying patient identification, surgical site, surgical procedure and administration of antibiotics.  A modified anterior Smith-Peterson approach to the hip was performed, using the interval between tensor fascia lata and sartorius.  Dissection was carried bluntly down onto the anterior hip capsule. The lateral femoral circumflex vessels were identified and coagulated. A capsulotomy was performed and the capsular flaps tagged for later repair.  Fluoroscopy was utilized to prepare for the femoral neck cut. The neck osteotomy was performed. The femoral head was removed, the acetabular rim was cleared of soft tissue and attention was turned to reaming the acetabulum.  Sequential reaming was performed under fluoroscopic guidance. We reamed to a size 51 mm, and then impacted the acetabular shell. The liner was then placed after irrigation and attention turned to the femur.  After placing the femoral hook, the leg was taken to externally rotated, extended and adducted position taking  care to perform soft tissue releases to allow for adequate mobilization of the femur. Soft tissue was cleared from the shoulder of the greater trochanter and the hook elevator used to improve exposure of the proximal femur. Sequential broaching performed up to a size 5. Trial neck and head were placed. The leg was brought back up to neutral and the construct reduced. The position and sizing of components, offset and leg lengths were checked using fluoroscopy. Stability of the  construct was checked in extension and external rotation without any subluxation or impingement of prosthesis. We dislocated the prosthesis, dropped the leg back into position, removed trial components, and irrigated copiously. The final stem and head was then placed, the leg brought back up, the system reduced and fluoroscopy used to verify positioning.  We irrigated, obtained hemostasis and closed the capsule using #2 ethibond suture.  One gram of vancomycin powder was placed in the surgical bed. The fascia was closed with #1 vicryl plus, the deep fat layer was closed with 0 vicryl, the subcutaneous layers closed with 2.0 Vicryl Plus and the skin closed with 3.0 nylon. A sterile dressing was applied. The patient was awakened in the operating room and taken to recovery in stable condition.  All sponge, needle, and instrument counts were correct at the end of the case.   Position: supine  Complications: see description of procedure.  Time Out: performed   Drains/Packing: none  Estimated blood loss: see anesthesia record  Returned to Recovery Room: in good condition.   Antibiotics: yes   Mechanical VTE (DVT) Prophylaxis: sequential compression devices, TED thigh-high  Chemical VTE (DVT) Prophylaxis: aspirin   Fluid Replacement: see anesthesia record  Specimens Removed: 1 to pathology  Sponge and Instrument Count Correct? yes   PACU: portable radiograph - low AP   Plan/RTC: Return in 2 weeks for staple removal.  Weight Bearing/Load Lower Extremity: full  Hip precautions: none Suture Removal: 2 weeks   N. Eduard Roux, MD [place] 9:04 AM   Implant Name Type Inv. Item Serial No. Manufacturer Lot No. LRB No. Used  PIN SECTOR W/GRIP ACE CUP 52MM - [id number] Hips PIN SECTOR W/GRIP ACE CUP 52MM  DEPUY SYNTHES [id number] Right 1  LINER NEUTRAL 52X36MM PLUS 4 - [id number] Liner LINER NEUTRAL 52X36MM PLUS 4  DEPUY SYNTHES [id number] Right 1  SCREW 6.5MMX25MM - [id number] Screw SCREW 6.5MMX25MM  DEPUY SYNTHES [id number] Right 1  HIP BALL ARTICU EZE 36 8.5 - [id number] Hips HIP BALL ARTICU EZE 36 8.5  DEPUY SYNTHES [id number] Right 1  STEM FEMORAL SZ 5MM STD ACTIS - [id number] Stem STEM FEMORAL SZ 5MM STD ACTIS  DEPUY ORTHOPAEDICS [id number] Right 1

## 2019-04-28 NOTE — Care Plan (Signed)
Ortho Bundle Case Management Note  Patient Details  Name: Danielle [name] MRN: [id number] Date of Birth: 09-Oct-1936   Patient has a spouse that will be assisting at discharge. She will need a FWW as well as a BSC/3 in 1. These have been ordered through [facility]. HHPT anticipated at discharge. Choice provided.                  DME Arranged:  3-N-1, Walker rolling DME Agency:  [employer]  HH Arranged:  PT [place] Agency:  [facility] ([facility])  Additional Comments: Please contact me with any questions of if this plan should need to change.  Jamse Arn, RN, BSN, [employer]  [phone] 04/28/2019, 9:14 AM

## 2019-04-28 NOTE — Care Management (Signed)
Patient is Ortho Bundle patient. All discharge and DME needs have been arranged by office Case Manager.

## 2019-04-28 NOTE — Transfer of Care (Signed)
Immediate Anesthesia Transfer of Care Note  Patient: Danielle [name]  Procedure(s) Performed: RIGHT TOTAL HIP ARTHROPLASTY ANTERIOR APPROACH (Right Hip)  Patient Location: PACU  Anesthesia Type:Spinal  Level of Consciousness: awake  Airway & Oxygen Therapy: Patient Spontanous Breathing  Post-op Assessment: Report given to RN and Post -op Vital signs reviewed and stable  Post vital signs: Reviewed and stable  Last Vitals:  Vitals Value Taken Time  BP    Temp    Pulse    Resp    SpO2      Last Pain:  Vitals:   04/28/19 0620  TempSrc: Oral  PainSc: 10-Worst pain ever      Patients Stated Pain Goal: 4 (47/30/85 6943)  Complications: No apparent anesthesia complications

## 2019-04-28 NOTE — Evaluation (Signed)
Physical Therapy Evaluation Patient Details Name: Danielle [name] MRN: [id number] DOB: 07/29/1936 Today's Date: 04/28/2019   History of Present Illness  Pt is an 83 y/o female s/p R THA, direct anterior. PMH includes DVT, HTN, and s/p pacemaker.   Clinical Impression  Pt is s/p surgery above with deficits below. Pt requiring min A to min guard A for mobility using RW. Pt very apprehensive with movement during eval, however, anticipate she will progress well. Will continue to follow acutely to maximize functional mobility independence and safety.     Follow Up Recommendations Follow surgeon's recommendation for DC plan and follow-up therapies;Supervision for mobility/OOB    Equipment Recommendations  Rolling walker with 5" wheels;3in1 (PT)    Recommendations for Other Services       Precautions / Restrictions Precautions Precautions: None Restrictions Weight Bearing Restrictions: Yes RLE Weight Bearing: Weight bearing as tolerated      Mobility  Bed Mobility Overal bed mobility: Needs Assistance Bed Mobility: Supine to Sit     Supine to sit: Min assist     General bed mobility comments: Min A for RLE assist.   Transfers Overall transfer level: Needs assistance Equipment used: Rolling walker (2 wheeled) Transfers: Sit to/from Stand Sit to Stand: Min assist         General transfer comment: Min A for steadying assist. Cues for safe hand placement.   Ambulation/Gait Ambulation/Gait assistance: Min guard Gait Distance (Feet): 5 Feet Assistive device: Rolling walker (2 wheeled) Gait Pattern/deviations: Step-to pattern;Decreased step length - right;Decreased stance time - right;Decreased weight shift to right;Antalgic Gait velocity: Decreased    General Gait Details: Pt ver apprehensive during gait and was very nervous to take steps. Very guarded. Encouraged pt to increase step length. Min guard for safety. Cues for sequencing using RW.   Stairs             Wheelchair Mobility    Modified Rankin (Stroke Patients Only)       Balance Overall balance assessment: Needs assistance Sitting-balance support: No upper extremity supported;Feet supported Sitting balance-Leahy Scale: Good     Standing balance support: Bilateral upper extremity supported;During functional activity Standing balance-Leahy Scale: Poor Standing balance comment: Reliant on BUE support                              Pertinent Vitals/Pain Pain Assessment: Faces Faces Pain Scale: Hurts a little bit Pain Location: R hip Pain Descriptors / Indicators: Aching;Operative site guarding Pain Intervention(s): Limited activity within patient's tolerance;Monitored during session;Repositioned    Home Living Family/patient expects to be discharged to:: Private residence Living Arrangements: Spouse/significant other Available Help at Discharge: Family;Available 24 hours/day Type of Home: House Home Access: Stairs to enter Entrance Stairs-Rails: Right;Left;Can reach both Entrance Stairs-Number of Steps: 2 Home Layout: Two level;Able to live on main level with bedroom/bathroom Home Equipment: Kasandra Knudsen - single point      Prior Function Level of Independence: Independent         Comments: Works on a farm      Journalist, newspaper        Extremity/Trunk Assessment   Upper Extremity Assessment Upper Extremity Assessment: Overall WFL for tasks assessed    Lower Extremity Assessment Lower Extremity Assessment: RLE deficits/detail RLE Deficits / Details: Deficits consistent with post op pain and weakness.     Cervical / Trunk Assessment Cervical / Trunk Assessment: Normal  Communication   Communication: No difficulties  Cognition  Arousal/Alertness: Awake/alert Behavior During Therapy: WFL for tasks assessed/performed Overall Cognitive Status: Within Functional Limits for tasks assessed                                        General  Comments      Exercises     Assessment/Plan    PT Assessment Patient needs continued PT services  PT Problem List Decreased strength;Decreased activity tolerance;Decreased balance;Decreased mobility;Decreased knowledge of use of DME;Decreased knowledge of precautions;Pain       PT Treatment Interventions Gait training;DME instruction;Stair training;Functional mobility training;Therapeutic activities;Therapeutic exercise;Balance training;Patient/family education    PT Goals (Current goals can be found in the Care Plan section)  Acute Rehab PT Goals Patient Stated Goal: 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  PT Goal Formulation: With patient Time For Goal Achievement: 05/12/19 Potential to Achieve Goals: Good    Frequency 7X/week   Barriers to discharge        Co-evaluation               AM-PAC PT "6 Clicks" Mobility  Outcome Measure Help needed turning from your back to your side while in a flat bed without using bedrails?: A Little Help needed moving from lying on your back to sitting on the side of a flat bed without using bedrails?: A Little Help needed moving to and from a bed to a chair (including a wheelchair)?: A Little Help needed standing up from a chair using your arms (e.g., wheelchair or bedside chair)?: A Little Help needed to walk in hospital room?: A Little Help needed climbing 3-5 steps with a railing? : A Lot 6 Click Score: 17    End of Session Equipment Utilized During Treatment: Gait belt Activity Tolerance: Patient tolerated treatment well Patient left: in chair;with call bell/phone within reach Nurse Communication: Mobility status PT Visit Diagnosis: Other abnormalities of gait and mobility (R26.89);Muscle weakness (generalized) (M62.81);Pain Pain - Right/Left: Right Pain - part of body: Hip    Time: 5625-6389 PT Time Calculation (min) (ACUTE ONLY): 24 min   Charges:   PT Evaluation $PT Eval Low Complexity: 1 Low PT  Treatments $Therapeutic Activity: 8-22 mins        Leighton Ruff, PT, DPT  Acute Rehabilitation Services  Pager: [phone] Office: [phone]   Rudean Hitt 04/28/2019, 5:37 PM

## 2019-04-28 NOTE — Anesthesia Postprocedure Evaluation (Signed)
Anesthesia Post Note  Patient: Danielle [name]  Procedure(s) Performed: RIGHT TOTAL HIP ARTHROPLASTY ANTERIOR APPROACH (Right Hip)     Patient location during evaluation: Nursing Unit Anesthesia Type: Spinal Level of consciousness: oriented and awake and alert Pain management: pain level controlled Vital Signs Assessment: post-procedure vital signs reviewed and stable Respiratory status: spontaneous breathing and respiratory function stable Cardiovascular status: blood pressure returned to baseline and stable Postop Assessment: no headache, no backache, no apparent nausea or vomiting and patient able to bend at knees Anesthetic complications: no    Last Vitals:  Vitals:   04/28/19 1150 04/28/19 1235  BP: (!) 145/76 126/60  Pulse: 61 62  Resp: (!) 9 16  Temp:  36.4 C  SpO2: 100% 100%    Last Pain:  Vitals:   04/28/19 1235  TempSrc:   PainSc: 4                  Barnet Glasgow

## 2019-04-28 NOTE — Care Management Obs Status (Signed)
[place] NOTIFICATION   Patient Details  Name: Danielle [name] MRN: [id number] Date of Birth: Aug 17, 1936   Medicare Observation Status Notification Given:  Yes(Case Manager explained Obs letter via telephone, Bedside RN delivered to patient's room. Patient was in agreement.)    Ninfa Meeker, RN 04/28/2019, 2:23 PM

## 2019-04-28 NOTE — Anesthesia Procedure Notes (Signed)
Spinal  Patient location during procedure: OR Start time: 04/28/2019 7:28 AM End time: 04/28/2019 7:36 AM Staffing Anesthesiologist: Barnet Glasgow, MD Preanesthetic Checklist Completed: patient identified, site marked, surgical consent, pre-op evaluation, timeout performed, IV checked, risks and benefits discussed and monitors and equipment checked Spinal Block Patient position: sitting Prep: DuraPrep Patient monitoring: heart rate, cardiac monitor, continuous pulse ox and blood pressure Approach: midline Location: L3-4 Injection technique: single-shot Needle Needle type: Sprotte  Needle gauge: 24 G Needle length: 9 cm Needle insertion depth: 6 cm Assessment Sensory level: T4

## 2019-04-28 NOTE — Discharge Instructions (Signed)

## 2019-04-28 NOTE — Progress Notes (Signed)
1330 Received pt from PACU, A&O x4. Right hip incision with Aquacel  dressing dry and intact. Denies pain at this time.

## 2019-04-28 NOTE — H&P (Signed)
PREOPERATIVE H&P  Chief Complaint: right hip degenerative joint disease  HPI: Danielle [name] is a 83 y.o. female who presents for surgical treatment of right hip degenerative joint disease.  She denies any changes in medical history.  Past Medical History:  Diagnosis Date  . ALLERGIC RHINITIS 01/09/2008  . ANEMIA-NOS 01/09/2008  . ANXIETY 01/09/2008  . ASTHMATIC BRONCHITIS, ACUTE 10/20/2008  . AV BLOCK, COMPLETE 12/16/2009  . DEPRESSION 01/09/2008  . Dizziness and giddiness 02/25/2010  . DVT, HX OF 01/09/2008  . Genetic testing 06/04/2017   Danielle [name] underwent genetic counseling and testing for hereditary cancer syndromes on 05/24/2017. Her results were negative for mutations in all 46 genes analyzed by [employer]'s 46-gene Common Hereditary Cancers Panel. Genes analyzed include: APC, ATM, AXIN2, BARD1, BMPR1A, BRCA1, BRCA2, BRIP1, CDH1, CDKN2A, CHEK2, CTNNA1, DICER1, EPCAM, GREM1, HOXB13, KIT, MEN1, MLH1, MSH2, MSH3, MSH6, MUTYH, NBN  . GERD 01/09/2008  . GLUCOSE INTOLERANCE 01/09/2008  . History of kidney stones   . HYPERLIPIDEMIA 01/09/2008  . HYPERTENSION 01/09/2008  . HYPOTHYROIDISM 01/09/2008  . Impaired glucose tolerance 07/28/2011  . INSOMNIA-SLEEP DISORDER-UNSPEC 06/15/2009  . NEPHROLITHIASIS, HX OF 01/09/2008  . OSTEOARTHRITIS, CERVICAL SPINE 02/25/2010  . Presence of permanent cardiac pacemaker    medtronic   Past Surgical History:  Procedure Laterality Date  . BREAST BIOPSY Right 09/20/2016   malignant  . ERCP W/ SPHICTEROTOMY    . HEMORRHOID SURGERY    . MASTECTOMY Right 01/11/2017  . MASTECTOMY W/ SENTINEL NODE BIOPSY Right 01/11/2017   Procedure: RIGHT MASTECTOMY WITH SENTINEL LYMPH NODE BIOPSY;  Surgeon: [name] Klein, MD;  Location: [facility];  Service: General;  Laterality: Right;  . PACEMAKER PLACEMENT     MedTronic EnPulse [id number]  . PERMANENT PACEMAKER GENERATOR CHANGE N/A 07/16/2012   Procedure: PERMANENT PACEMAKER GENERATOR CHANGE;  Surgeon: Deboraha Sprang, MD;   Location: [facility];  Service: Cardiovascular;  Laterality: N/A;  . POLYPECTOMY     Uterine   Social History   Socioeconomic History  . Marital status: Married    Spouse name: Not on file  . Number of children: Not on file  . Years of education: Not on file  . Highest education level: Not on file  Occupational History  . Not on file  Social Needs  . Financial resource strain: Not on file  . Food insecurity:    Worry: Not on file    Inability: Not on file  . Transportation needs:    Medical: Not on file    Non-medical: Not on file  Tobacco Use  . Smoking status: Former Smoker    Packs/day: 0.20    Years: 25.00    Pack years: 5.00    Types: Cigarettes    Last attempt to quit: 11/21/1971    Years since quitting: 47.4  . Smokeless tobacco: Never Used  Substance and Sexual Activity  . Alcohol use: Yes    Comment: wine  . Drug use: No  . Sexual activity: Not on file  Lifestyle  . Physical activity:    Days per week: Not on file    Minutes per session: Not on file  . Stress: Not on file  Relationships  . Social connections:    Talks on phone: Not on file    Gets together: Not on file    Attends religious service: Not on file    Active member of club or organization: Not on file    Attends meetings of clubs or organizations: Not  on file    Relationship status: Not on file  Other Topics Concern  . Not on file  Social History Narrative  . Not on file   Family History  Problem Relation Age of Onset  . Sudden death Mother        d.73s  . Breast cancer Mother 93  . Pulmonary embolism Father        Possible  . Lung cancer Paternal Grandfather        history of smoking  . Breast cancer Maternal Aunt        d.78s   Allergies  Allergen Reactions  . Codeine Nausea And Vomiting  . Oxycodone Nausea And Vomiting   Prior to Admission medications   Medication Sig Start Date End Date Taking? Authorizing Provider  atenolol (TENORMIN) 50 MG tablet Take 1 tablet (50 mg  total) by mouth daily. Must keep follow-up appt in July for future refills 03/10/19  Yes Biagio Borg, MD  atorvastatin (LIPITOR) 10 MG tablet TAKE 1 TABLET(10 MG) BY MOUTH DAILY Patient taking differently: Take 10 mg by mouth daily.  04/17/19  Yes Biagio Borg, MD  enalapril (VASOTEC) 10 MG tablet Take 1 tablet (10 mg total) by mouth daily. Must keep follow-up appt in July for future refills 03/10/19  Yes Biagio Borg, MD  Flaxseed, Linseed, (FLAXSEED OIL PO) Take 1 capsule by mouth daily.   Yes [provider]  levothyroxine (SYNTHROID) 112 MCG tablet Take 1 tablet (112 mcg total) by mouth daily before breakfast. Must keep follow-up appt in July for future refills 03/10/19  Yes Biagio Borg, MD  OVER THE COUNTER MEDICATION Take 1 capsule by mouth daily. Ahiflower oil capsule ("Similar to flaxseed oil")   Yes [provider]  TraMADol HCl 200 MG CP24 TAKE ONE CAPSULE BY MOUTH DAILY AS NEEDED FOR MODERATE PAIN. Patient taking differently: Take 200 mg by mouth daily as needed (moderate pain).  12/18/18  Yes Biagio Borg, MD  azelastine (ASTELIN) 0.1 % nasal spray Place 1 spray into both nostrils 2 (two) times daily. Use in each nostril as directed Patient taking differently: Place 2 sprays into both nostrils daily as needed for allergies.  12/18/14   Biagio Borg, MD  calcium-vitamin D (OSCAL WITH D) 500-200 MG-UNIT tablet Take 1 tablet by mouth daily with breakfast.    [provider]  pantoprazole (PROTONIX) 40 MG tablet Take 1 tablet (40 mg total) by mouth as needed. Patient not taking: Reported on 04/22/2019 05/06/18   Biagio Borg, MD     Positive ROS: All other systems have been reviewed and were otherwise negative with the exception of those mentioned in the HPI and as above.  Physical Exam: General: Alert, no acute distress Cardiovascular: No pedal edema Respiratory: No cyanosis, no use of accessory musculature GI: abdomen soft Skin: No lesions in the area of  chief complaint Neurologic: Sensation intact distally Psychiatric: Patient is competent for consent with normal mood and affect Lymphatic: no lymphedema  MUSCULOSKELETAL: exam stable  Assessment: right hip degenerative joint disease  Plan: Plan for Procedure(s): RIGHT TOTAL HIP ARTHROPLASTY ANTERIOR APPROACH  The risks benefits and alternatives were discussed with the patient including but not limited to the risks of nonoperative treatment, versus surgical intervention including infection, bleeding, nerve injury,  blood clots, cardiopulmonary complications, morbidity, mortality, among others, and they were willing to proceed.   Preoperative templating of the joint replacement has been completed, documented, and submitted to the Operating  Room personnel in order to optimize intra-operative equipment management.  Eduard Roux, MD   04/28/2019 7:08 AM

## 2019-04-29 ENCOUNTER — Encounter (HOSPITAL_COMMUNITY): Payer: Self-pay | Admitting: Orthopaedic Surgery

## 2019-04-29 DIAGNOSIS — J309 Allergic rhinitis, unspecified: Secondary | ICD-10-CM | POA: Diagnosis not present

## 2019-04-29 DIAGNOSIS — E785 Hyperlipidemia, unspecified: Secondary | ICD-10-CM | POA: Diagnosis not present

## 2019-04-29 DIAGNOSIS — I10 Essential (primary) hypertension: Secondary | ICD-10-CM | POA: Diagnosis not present

## 2019-04-29 DIAGNOSIS — M1611 Unilateral primary osteoarthritis, right hip: Secondary | ICD-10-CM | POA: Diagnosis not present

## 2019-04-29 DIAGNOSIS — E039 Hypothyroidism, unspecified: Secondary | ICD-10-CM | POA: Diagnosis not present

## 2019-04-29 DIAGNOSIS — D62 Acute posthemorrhagic anemia: Secondary | ICD-10-CM | POA: Diagnosis not present

## 2019-04-29 LAB — BASIC METABOLIC PANEL
Anion gap: 7 (ref 5–15)
BUN: 19 mg/dL (ref 8–23)
CO2: 24 mmol/L (ref 22–32)
Calcium: 8.7 mg/dL — ABNORMAL LOW (ref 8.9–10.3)
Chloride: 108 mmol/L (ref 98–111)
Creatinine, Ser: 1.14 mg/dL — ABNORMAL HIGH (ref 0.44–1.00)
GFR calc Af Amer: 52 mL/min — ABNORMAL LOW (ref >60)
GFR calc non Af Amer: 45 mL/min — ABNORMAL LOW (ref >60)
Glucose, Bld: 110 mg/dL — ABNORMAL HIGH (ref 70–99)
Potassium: 3.8 mmol/L (ref 3.5–5.1)
Sodium: 139 mmol/L (ref 135–145)

## 2019-04-29 LAB — CBC
HCT: 27.4 % — ABNORMAL LOW (ref 36.0–46.0)
Hemoglobin: 9 g/dL — ABNORMAL LOW (ref 12.0–15.0)
MCH: 30.5 pg (ref 26.0–34.0)
MCHC: 32.8 g/dL (ref 30.0–36.0)
MCV: 92.9 fL (ref 80.0–100.0)
Platelets: 104 10*3/uL — ABNORMAL LOW (ref 150–400)
RBC: 2.95 MIL/uL — ABNORMAL LOW (ref 3.87–5.11)
RDW: 12.8 % (ref 11.5–15.5)
WBC: 6.7 10*3/uL (ref 4.0–10.5)
nRBC: 0 % (ref 0.0–0.2)

## 2019-04-29 NOTE — Discharge Summary (Signed)
Patient ID: Danielle [name] MRN: [id number] DOB/AGE: Sep 18, 1936 83 y.o.  Admit date: 04/28/2019 Discharge date: 04/29/2019  Admission Diagnoses:  Principal Problem:   Degenerative joint disease of right hip Active Problems:   History of total hip replacement   Discharge Diagnoses:  Same  Past Medical History:  Diagnosis Date  . ALLERGIC RHINITIS 01/09/2008  . ANEMIA-NOS 01/09/2008  . ANXIETY 01/09/2008  . ASTHMATIC BRONCHITIS, ACUTE 10/20/2008  . AV BLOCK, COMPLETE 12/16/2009  . DEPRESSION 01/09/2008  . Dizziness and giddiness 02/25/2010  . DVT, HX OF 01/09/2008  . Genetic testing 06/04/2017   Ms. Pelly underwent genetic counseling and testing for hereditary cancer syndromes on 05/24/2017. Her results were negative for mutations in all 46 genes analyzed by [facility]'s 46-gene Common Hereditary Cancers Panel. Genes analyzed include: APC, ATM, AXIN2, BARD1, BMPR1A, BRCA1, BRCA2, BRIP1, CDH1, CDKN2A, CHEK2, CTNNA1, DICER1, EPCAM, GREM1, HOXB13, KIT, MEN1, MLH1, MSH2, MSH3, MSH6, MUTYH, NBN  . GERD 01/09/2008  . GLUCOSE INTOLERANCE 01/09/2008  . History of kidney stones   . HYPERLIPIDEMIA 01/09/2008  . HYPERTENSION 01/09/2008  . HYPOTHYROIDISM 01/09/2008  . Impaired glucose tolerance 07/28/2011  . INSOMNIA-SLEEP DISORDER-UNSPEC 06/15/2009  . NEPHROLITHIASIS, HX OF 01/09/2008  . OSTEOARTHRITIS, CERVICAL SPINE 02/25/2010  . Presence of permanent cardiac pacemaker    medtronic    Surgeries: Procedure(s): RIGHT TOTAL HIP ARTHROPLASTY ANTERIOR APPROACH on 04/28/2019   Consultants:   Discharged Condition: Improved  Hospital Course: Danielle [name] is an 83 y.o. female who was admitted 04/28/2019 for operative treatment ofDegenerative joint disease of right hip. Patient has severe unremitting pain that affects sleep, daily activities, and work/hobbies. After pre-op clearance the patient was taken to the operating room on 04/28/2019 and underwent  Procedure(s): RIGHT TOTAL HIP ARTHROPLASTY ANTERIOR  APPROACH.    Patient was given perioperative antibiotics:  Anti-infectives (From admission, onward)   Start     Dose/Rate Route Frequency Ordered Stop   04/28/19 1430  ceFAZolin (ANCEF) IVPB 2g/100 mL premix  Status:  Discontinued     2 g 200 mL/hr over 30 Minutes Intravenous Every 8 hours 04/28/19 1330 04/28/19 1401   04/28/19 1430  ceFAZolin (ANCEF) IVPB 1 g/50 mL premix     1 g 100 mL/hr over 30 Minutes Intravenous Every 8 hours 04/28/19 1401 04/28/19 2227   04/28/19 0817  vancomycin (VANCOCIN) powder  Status:  Discontinued       As needed 04/28/19 0818 04/28/19 0929   04/28/19 0615  ceFAZolin (ANCEF) IVPB 2g/100 mL premix     2 g 200 mL/hr over 30 Minutes Intravenous On call to O.R. 04/28/19 0608 04/28/19 0800   04/28/19 0610  ceFAZolin (ANCEF) 2-4 GM/100ML-% IVPB    Note to Pharmacy:  Tamsen Snider   : cabinet override      04/28/19 0610 04/28/19 0800       Patient was given sequential compression devices, early ambulation, and chemoprophylaxis to prevent DVT.  Patient benefited maximally from hospital stay and there were no complications.    Recent vital signs:  Patient Vitals for the past 24 hrs:  BP Temp Temp src Pulse Resp SpO2  04/29/19 0726 (!) 125/58 98.5 F (36.9 C) Oral 61 16 97 %  04/29/19 0336 (!) 117/51 98.4 F (36.9 C) Oral 63 14 99 %  04/28/19 2338 (!) 125/57 98.3 F (36.8 C) Oral 60 16 98 %  04/28/19 2027 (!) 115/53 98 F (36.7 C) Oral 65 15 99 %  04/28/19 1330 135/79 97.7 F (36.5 C) Oral 69  16 100 %  04/28/19 1235 126/60 97.6 F (36.4 C) - 62 16 100 %  04/28/19 1150 (!) 145/76 - - 61 (!) 9 100 %  04/28/19 1135 (!) 142/88 - - 60 18 100 %  04/28/19 1120 (!) 158/79 (!) 97.5 F (36.4 C) - 60 10 100 %  04/28/19 1105 (!) 158/82 - - 60 12 100 %  04/28/19 1050 (!) 165/78 - - 62 12 100 %  04/28/19 1005 (!) 144/83 - - 63 13 100 %  04/28/19 0935 112/84 - - - - -     Recent laboratory studies:  Recent Labs    04/29/19 0609  NA 139  K 3.8  CL 108   CO2 24  BUN 19  CREATININE 1.14*  GLUCOSE 110*  CALCIUM 8.7*     Discharge Medications:   Allergies as of 04/29/2019      Reactions   Codeine Nausea And Vomiting   Oxycodone Nausea And Vomiting      Medication List    STOP taking these medications   FLAXSEED OIL PO   OVER THE COUNTER MEDICATION   TraMADol HCl 200 MG Cp24     TAKE these medications   aspirin EC 81 MG tablet Take 1 tablet (81 mg total) by mouth 2 (two) times daily.   atenolol 50 MG tablet Commonly known as:  TENORMIN Take 1 tablet (50 mg total) by mouth daily. Must keep follow-up appt in July for future refills   atorvastatin 10 MG tablet Commonly known as:  LIPITOR TAKE 1 TABLET(10 MG) BY MOUTH DAILY What changed:  See the new instructions.   azelastine 0.1 % nasal spray Commonly known as:  ASTELIN Place 1 spray into both nostrils 2 (two) times daily. Use in each nostril as directed What changed:    how much to take  when to take this  reasons to take this  additional instructions   calcium-vitamin D 500-200 MG-UNIT tablet Commonly known as:  OSCAL WITH D Take 1 tablet by mouth daily with breakfast.   enalapril 10 MG tablet Commonly known as:  VASOTEC Take 1 tablet (10 mg total) by mouth daily. Must keep follow-up appt in July for future refills   HYDROcodone-acetaminophen 10-325 MG tablet Commonly known as:  Norco Take 1-2 tablets by mouth 3 (three) times daily as needed for moderate pain or severe pain.   levothyroxine 112 MCG tablet Commonly known as:  SYNTHROID Take 1 tablet (112 mcg total) by mouth daily before breakfast. Must keep follow-up appt in July for future refills   methocarbamol 500 MG tablet Commonly known as:  ROBAXIN Take 1 tablet (500 mg total) by mouth every 6 (six) hours as needed for muscle spasms.   ondansetron 4 MG tablet Commonly known as:  ZOFRAN Take 1-2 tablets (4-8 mg total) by mouth every 8 (eight) hours as needed for nausea or vomiting.    pantoprazole 40 MG tablet Commonly known as:  PROTONIX Take 1 tablet (40 mg total) by mouth as needed.   promethazine 25 MG tablet Commonly known as:  PHENERGAN Take 1 tablet (25 mg total) by mouth every 6 (six) hours as needed for nausea.   senna-docusate 8.6-50 MG tablet Commonly known as:  Senokot S Take 1-2 tablets by mouth at bedtime as needed.            Durable Medical Equipment  (From admission, onward)         Start     Ordered   04/28/19  1330  DME Walker rolling  Once    Question:  Patient needs a walker to treat with the following condition  Answer:  History of hip replacement   04/28/19 1330   04/28/19 1330  DME 3 n 1  Once     04/28/19 1330   04/28/19 1330  DME Bedside commode  Once    Question:  Patient needs a bedside commode to treat with the following condition  Answer:  History of hip replacement   04/28/19 1330          Diagnostic Studies: Dg Chest 2 View  Result Date: 04/24/2019 CLINICAL DATA:  Osteoarthritis. Pre-admission testing for right hip replacement. History of cardiac pacer. EXAM: CHEST - 2 VIEW COMPARISON:  02/22/2017. FINDINGS: Cardiac pacer with lead tip over the right atrium right ventricle. Heart size normal. No acute pulmonary disease. Stable basal pleural thickening consistent scarring. Right mastectomy. Left nipple shadow noted. Thoracic spine scoliosis degenerative change. No acute bony abnormality. IMPRESSION: 1. Cardiac pacer with lead tip over the right atrium right ventricle. No acute cardiopulmonary disease. 2.  Prior right mastectomy.  Prominent nipple shadow noted on left. Electronically Signed   By: Marcello Moores  Register   On: 04/24/2019 15:19   Dg Pelvis Portable  Result Date: 04/28/2019 CLINICAL DATA:  Status post right hip replacement today. EXAM: PORTABLE PELVIS 1-2 VIEWS COMPARISON:  Intraoperative imaging earlier today. FINDINGS: Total hip arthroplasty is in place. The device is located. Gas in the soft tissues from surgery  noted. No fracture or other acute bony abnormality. Advanced left hip osteoarthritis is seen. IMPRESSION: Status post right hip replacement.  No acute finding. Advanced appearing left hip osteoarthritis. Electronically Signed   By: Inge Rise M.D.   On: 04/28/2019 10:12   Dg C-arm 1-60 Min  Result Date: 04/28/2019 CLINICAL DATA:  Status post right hip replacement. FLUOROSCOPY TIME:  26 seconds. Images: 2 EXAM: DG C-ARM 61-120 MIN COMPARISON:  None. FINDINGS: Patient is status post right hip replacement. Hardware is in good position. IMPRESSION: The patient is status post right hip replacement. Hardware is in good position. Electronically Signed   By: Dorise Bullion III M.D   On: 04/28/2019 09:06   Dg Hip Operative Unilat W Or W/o Pelvis Right  Result Date: 04/28/2019 CLINICAL DATA:  Status post right hip replacement. FLUOROSCOPY TIME:  26 seconds. Images: 2 EXAM: DG C-ARM 61-120 MIN COMPARISON:  None. FINDINGS: Patient is status post right hip replacement. Hardware is in good position. IMPRESSION: The patient is status post right hip replacement. Hardware is in good position. Electronically Signed   By: Dorise Bullion III M.D   On: 04/28/2019 09:06    Disposition: Discharge disposition: 01-Home or Self Care         Follow-up Information    Leandrew Koyanagi, MD. Go on 05/13/2019.   Specialty:  Orthopedic Surgery Why:  1:30 pm appointment with Dr. Erlinda Hong For suture removal, For wound re-check Contact information: [place] [address] [phone]        Home, [facility] Follow up.   Specialty:  Home Health Services Why:  You will receive 5 Home Health Physical Therapy visits after discharge to home prior to your follow up with Dr. Erlinda Hong. Someone will be in contact after discharge to arrange. Contact information: [address] [place] [address] [phone]            Signed: Aundra Dubin 04/29/2019, 8:06 AM

## 2019-04-29 NOTE — Progress Notes (Signed)
AVS given and reviewed with pt. Medications discussed. All questions answered to satisfaction. Pt verbalized understanding of information given. Pt to be escorted off the unit via wheelchair by staff member.

## 2019-04-29 NOTE — TOC Transition Note (Addendum)
Transition of Care [facility]) - CM/SW Discharge Note Marvetta Gibbons RN,BSN Transitions of Care Cross Coverage for 5N - RN Case Manager [phone]   Patient Details  Name: Danielle [name] MRN: [id number] Date of Birth: [birth year]  [facility] CM/SW Contact:  Dawayne Patricia, RN Phone Number: 04/29/2019, 10:44 AM   Clinical Narrative:    Pt s/p total hip, ortho bundle, HH set up with [facility], call received this am from [employer] that 3n1 and RW will be delivered to room here prior to discharge this am. Bedside RN notified to watch for DME delivery  Final next level of care: [place] Barriers to Discharge: No Barriers Identified   Patient Goals and CMS Choice Patient states their goals for this hospitalization and ongoing recovery are:: home      Discharge Placement  Home with [facility]                     Discharge Plan and Services   Discharge Planning Services: CM Consult Post Acute Care Choice: Durable Medical Equipment, Home Health          DME Arranged: 3-N-1, Walker rolling DME Agency: [employer] Date DME Agency Contacted: 04/29/19     HH Arranged: PT [place] Agency: [facility] ([facility])        Social Determinants of Health ([place]) Interventions     Readmission Risk Interventions Readmission Risk Prevention Plan 04/29/2019  Transportation Screening Complete  PCP or Specialist Appt within 5-7 Days Complete  Home Care Screening Complete  Medication Review (RN CM) Complete  Some recent data might be hidden

## 2019-04-29 NOTE — Progress Notes (Addendum)
Physical Therapy Treatment Patient Details Name: Danielle [name] MRN: [id number] DOB: 05/18/1936 Today's Date: 04/29/2019    History of Present Illness Pt is an 83 y/o female s/p R THA, direct anterior. PMH includes DVT, HTN, and s/p pacemaker.     PT Comments    Pt slow and guarded initially but as gt progressed she reports pain improves and it feels good to be up.  Performed stair training to prepare for entry into home.  Progressed to standing exercises.  Will return in pm to review supine exercises and progress gait distance.  Pt is progressing well.   Follow Up Recommendations  Follow surgeon's recommendation for DC plan and follow-up therapies;Supervision for mobility/OOB     Equipment Recommendations  Rolling walker with 5" wheels;3in1 (PT)    Recommendations for Other Services       Precautions / Restrictions Precautions Precautions: None Restrictions Weight Bearing Restrictions: Yes RLE Weight Bearing: Weight bearing as tolerated    Mobility  Bed Mobility Overal bed mobility: Needs Assistance Bed Mobility: Supine to Sit     Supine to sit: Supervision     General bed mobility comments: No assistance needed to move to edge of bed.  PTA managing IV line.    Transfers Overall transfer level: Needs assistance Equipment used: Rolling walker (2 wheeled) Transfers: Sit to/from Stand Sit to Stand: Supervision         General transfer comment: Cues for hand placement and safety.  Ambulation/Gait Ambulation/Gait assistance: Min guard Gait Distance (Feet): 250 Feet Assistive device: Rolling walker (2 wheeled) Gait Pattern/deviations: Decreased step length - right;Decreased stance time - right;Decreased weight shift to right;Antalgic;Step-through pattern;Trunk flexed Gait velocity: Decreased    General Gait Details: Cues for upper trunk control and forward gaze.  Cues for RW safety.     Stairs Stairs: Yes Stairs assistance: Supervision Stair Management:  Two rails Number of Stairs: 4 General stair comments: Cues for sequencing and use of Railings for support.     Wheelchair Mobility    Modified Rankin (Stroke Patients Only)       Balance Overall balance assessment: Needs assistance Sitting-balance support: No upper extremity supported;Feet supported Sitting balance-Leahy Scale: Good       Standing balance-Leahy Scale: Poor Standing balance comment: Reliant on BUE support                             Cognition Arousal/Alertness: Awake/alert Behavior During Therapy: WFL for tasks assessed/performed Overall Cognitive Status: Within Functional Limits for tasks assessed                                        Exercises Total Joint Exercises Hip ABduction/ADduction: AROM;Right;10 reps;Standing Knee Flexion: AROM;10 reps;Standing;Right Marching in Standing: AROM;10 reps;Standing;Right Standing Hip Extension: AROM;10 reps;Standing;Right    General Comments        Pertinent Vitals/Pain Pain Assessment: Faces Faces Pain Scale: Hurts a little bit Pain Location: R hip Pain Descriptors / Indicators: Aching;Operative site guarding Pain Intervention(s): Monitored during session;Repositioned    Home Living                      Prior Function            PT Goals (current goals can now be found in the care plan section) Acute Rehab PT Goals 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  Potential to Achieve Goals: Good Progress towards PT goals: Progressing toward goals    Frequency    7X/week      PT Plan Current plan remains appropriate    Co-evaluation              AM-PAC PT "6 Clicks" Mobility   Outcome Measure  Help needed turning from your back to your side while in a flat bed without using bedrails?: A Little Help needed moving from lying on your back to sitting on the side of a flat bed without using bedrails?: A Little Help needed moving to and from  a bed to a chair (including a wheelchair)?: A Little Help needed standing up from a chair using your arms (e.g., wheelchair or bedside chair)?: A Little Help needed to walk in hospital room?: A Little Help needed climbing 3-5 steps with a railing? : A Little 6 Click Score: 18    End of Session Equipment Utilized During Treatment: Gait belt Activity Tolerance: Patient tolerated treatment well Patient left: in chair;with call bell/phone within reach Nurse Communication: Mobility status PT Visit Diagnosis: Other abnormalities of gait and mobility (R26.89);Muscle weakness (generalized) (M62.81);Pain Pain - Right/Left: Right Pain - part of body: Hip     Time: 9735-3299 PT Time Calculation (min) (ACUTE ONLY): 25 min  Charges:  $Gait Training: 8-22 mins $Therapeutic Exercise: 8-22 mins                     Governor Rooks, PTA Acute Rehabilitation Services Pager [phone] Office [phone]     Katherene Dinino Eli Hose 04/29/2019, 1:06 PM

## 2019-04-29 NOTE — Plan of Care (Signed)
Problem: Education: Goal: Knowledge of General Education information will improve Description Including pain rating scale, medication(s)/side effects and non-pharmacologic comfort measures Outcome: Progressing   Problem: Clinical Measurements: Goal: Ability to maintain clinical measurements within normal limits will improve Outcome: Progressing Goal: Will remain free from infection Outcome: Progressing Goal: Respiratory complications will improve Outcome: Progressing Goal: Cardiovascular complication will be avoided Outcome: Progressing   Problem: Activity: Goal: Risk for activity intolerance will decrease Outcome: Progressing   Problem: Nutrition: Goal: Adequate nutrition will be maintained Outcome: Progressing   Problem: Elimination: Goal: Will not experience complications related to urinary retention Outcome: Progressing   Problem: Pain Managment: Goal: General experience of comfort will improve Outcome: Progressing   Problem: Safety: Goal: Ability to remain free from injury will improve Outcome: Progressing   Problem: Skin Integrity: Goal: Risk for impaired skin integrity will decrease Outcome: Progressing

## 2019-04-29 NOTE — Care Plan (Signed)
RNCM with [facility] at [facility] spoke with patient while she was still in hospital today. She reports she has worked with therapy twice today and is doing well. Her DME has been delivered to her room and she anticipates discharge soon. RNCM will continue to be in touch with patient. F/U with MD scheduled in office for 05/13/19 at 1:30 pm.

## 2019-04-29 NOTE — Progress Notes (Signed)
Subjective: 1 Day Post-Op Procedure(s) (LRB): RIGHT TOTAL HIP ARTHROPLASTY ANTERIOR APPROACH (Right) Patient reports pain as mild.  Feeling great this am.  Ready to go home asap.   Objective: Vital signs in last 24 hours: Temp:  [97.5 F (36.4 C)-98.5 F (36.9 C)] 98.5 F (36.9 C) (06/09 0726) Pulse Rate:  [60-69] 61 (06/09 0726) Resp:  [9-18] 16 (06/09 0726) BP: (112-165)/(51-88) 125/58 (06/09 0726) SpO2:  [97 %-100 %] 97 % (06/09 0726)  Intake/Output from previous day: 06/08 0701 - 06/09 0700 In: 300 [I.V.:300] Out: 1350 [Urine:1250; Blood:100] Intake/Output this shift: No intake/output data recorded.  No results for input(s): HGB in the last 72 hours. No results for input(s): WBC, RBC, HCT, PLT in the last 72 hours. Recent Labs    04/29/19 0609  NA 139  K 3.8  CL 108  CO2 24  BUN 19  CREATININE 1.14*  GLUCOSE 110*  CALCIUM 8.7*   No results for input(s): LABPT, INR in the last 72 hours.  Neurologically intact Neurovascular intact Sensation intact distally Intact pulses distally Dorsiflexion/Plantar flexion intact Incision: dressing C/D/I No cellulitis present Compartment soft   Assessment/Plan: 1 Day Post-Op Procedure(s) (LRB): RIGHT TOTAL HIP ARTHROPLASTY ANTERIOR APPROACH (Right) Advance diet Up with therapy D/C IV fluids Discharge home with home health after first or second session of PT (up to patient and depending on how well she mobilizes) WBAT RLE- no precautions ABLA- mild and stable D/c foley Apply ted hose to [place] 04/29/2019, 8:04 AM

## 2019-04-29 NOTE — Progress Notes (Addendum)
Physical Therapy Treatment Patient Details Name: Danielle [name] MRN: [id number] DOB: 1936/04/16 Today's Date: 04/29/2019    History of Present Illness Pt is an 83 y/o female s/p R THA, direct anterior. PMH includes DVT, HTN, and s/p pacemaker.     PT Comments    Pt continues to mobilize well.  Reviewed supine and seated exercises and progressed gait distance.  Informed nursing patient was ready for d/c home.     Follow Up Recommendations  Follow surgeon's recommendation for DC plan and follow-up therapies;Supervision for mobility/OOB     Equipment Recommendations  Rolling walker with 5" wheels;3in1 (PT)    Recommendations for Other Services       Precautions / Restrictions Precautions Precautions: None Restrictions Weight Bearing Restrictions: Yes RLE Weight Bearing: Weight bearing as tolerated    Mobility  Bed Mobility Overal bed mobility: Needs Assistance Bed Mobility: Supine to Sit     Supine to sit: Supervision     General bed mobility comments: Pt seated in recliner on arrival.    Transfers Overall transfer level: Needs assistance Equipment used: Rolling walker (2 wheeled) Transfers: Sit to/from Stand Sit to Stand: Supervision         General transfer comment: Continues to require cues for safety and cues to keep RW close by until she is seated.    Ambulation/Gait Ambulation/Gait assistance: Supervision Gait Distance (Feet): 550 Feet Assistive device: Rolling walker (2 wheeled) Gait Pattern/deviations: Decreased step length - right;Decreased stance time - right;Decreased weight shift to right;Antalgic;Step-through pattern;Trunk flexed Gait velocity: Decreased    General Gait Details: Cues for upper trunk control and forward gaze.  Cues for RW safety.     Stairs Stairs: Yes Stairs assistance: Supervision Stair Management: Two rails Number of Stairs: 4 General stair comments: Cues for sequencing and use of Railings for support.     Wheelchair  Mobility    Modified Rankin (Stroke Patients Only)       Balance Overall balance assessment: Needs assistance Sitting-balance support: No upper extremity supported;Feet supported Sitting balance-Leahy Scale: Good       Standing balance-Leahy Scale: Fair Standing balance comment: Reliant on BUE support                             Cognition Arousal/Alertness: Awake/alert Behavior During Therapy: WFL for tasks assessed/performed Overall Cognitive Status: Within Functional Limits for tasks assessed                                        Exercises Total Joint Exercises Ankle Circles/Pumps: AROM;10 reps;Both;Supine Quad Sets: AROM;10 reps;Supine;Right Short Arc Quad: AROM;Right;10 reps;Supine Heel Slides: AROM;10 reps;Right;Supine Hip ABduction/ADduction: AROM;10 reps;Right;Supine Long Arc Quad: AROM;10 reps;Right;Seated Knee Flexion: AROM;10 reps;Standing;Right Marching in Standing: AROM;10 reps;Standing;Right Standing Hip Extension: AROM;10 reps;Standing;Right    General Comments        Pertinent Vitals/Pain Pain Assessment: Faces Faces Pain Scale: Hurts a little bit Pain Location: R hip Pain Descriptors / Indicators: Aching;Operative site guarding Pain Intervention(s): Monitored during session;Repositioned;Ice applied    Home Living                      Prior Function            PT Goals (current goals can now be found in the care plan section) Acute Rehab PT Goals 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  Potential to Achieve Goals: Good Progress towards PT goals: Progressing toward goals    Frequency    7X/week      PT Plan Current plan remains appropriate    Co-evaluation              AM-PAC PT "6 Clicks" Mobility   Outcome Measure  Help needed turning from your back to your side while in a flat bed without using bedrails?: None Help needed moving from lying on your back to sitting on  the side of a flat bed without using bedrails?: None Help needed moving to and from a bed to a chair (including a wheelchair)?: A Little Help needed standing up from a chair using your arms (e.g., wheelchair or bedside chair)?: A Little Help needed to walk in hospital room?: A Little Help needed climbing 3-5 steps with a railing? : A Little 6 Click Score: 20    End of Session Equipment Utilized During Treatment: Gait belt Activity Tolerance: Patient tolerated treatment well Patient left: in chair;with call bell/phone within reach Nurse Communication: Mobility status PT Visit Diagnosis: Other abnormalities of gait and mobility (R26.89);Muscle weakness (generalized) (M62.81);Pain Pain - Right/Left: Right Pain - part of body: Hip     Time: 1200-1218 PT Time Calculation (min) (ACUTE ONLY): 18 min  Charges:  $Gait Training: 8-22 mins $Therapeutic Exercise: 8-22 mins                     Governor Rooks, PTA Acute Rehabilitation Services Pager [phone] Office [phone]     Keyasia Jolliff Eli Hose 04/29/2019, 1:15 PM

## 2019-04-30 ENCOUNTER — Telehealth: Payer: Self-pay | Admitting: *Deleted

## 2019-04-30 NOTE — Telephone Encounter (Signed)
Patient was on TCM list today  Admit date: 04/28/19 Discharge date: 04/29/19   Hospital Course: Danielle [name] is an 83 y.o. female who was admitted 04/28/2019 for operative treatment ofDegenerative joint disease of right hip. Patient has severe unremitting pain that affects sleep, daily activities, and work/hobbies. After pre-op clearance the patient was taken to the operating room on 04/28/2019 and underwent  Procedure(s): RIGHT TOTAL HIP ARTHROPLASTY ANTERIOR APPROACH.   Patient is to f/u as described below:   Leandrew Koyanagi, MD. Go on 05/13/2019.   Specialty:  Orthopedic Surgery Why:  1:30 pm appointment with Dr. Erlinda Hong For suture removal, For wound re-check Contact information: [place] [address] [phone]  Home, [facility] Follow up.   Specialty:  Home Health Services Why:  You will receive 5 Home Health Physical Therapy visits after discharge to home prior to your follow up with Dr. Erlinda Hong. Someone will be in contact after discharge to arrange. Contact information: [address] [place] [address] [phone]

## 2019-04-30 NOTE — Telephone Encounter (Signed)
[facility] D/C Call completed.

## 2019-04-30 NOTE — Care Plan (Signed)
RNCM spoke with patient this morning after d/c from hospital yesterday, 04/29/19. She reports she is doing well, but is having some nausea and vomiting last night and today. She indicated that this is very typical for her after any surgery and she is currently not concerned. Reports her pain is very minimal. Discussed HHPT will be calling her today and she requested they wait until tomorrow to begin in home therapy. RNCM informed her that she should communicate this with them regarding scheduling when they call to set up an in home appointment time. She is aware of how to contact RNCM for further needs or questions. Overall, she states she is currently doing well. Reviewed initial post-op appointment with Dr. Erlinda Hong on 05/13/19.

## 2019-05-02 DIAGNOSIS — K219 Gastro-esophageal reflux disease without esophagitis: Secondary | ICD-10-CM | POA: Diagnosis not present

## 2019-05-02 DIAGNOSIS — E039 Hypothyroidism, unspecified: Secondary | ICD-10-CM | POA: Diagnosis not present

## 2019-05-02 DIAGNOSIS — Z86718 Personal history of other venous thrombosis and embolism: Secondary | ICD-10-CM | POA: Diagnosis not present

## 2019-05-02 DIAGNOSIS — Z853 Personal history of malignant neoplasm of breast: Secondary | ICD-10-CM | POA: Diagnosis not present

## 2019-05-02 DIAGNOSIS — M4722 Other spondylosis with radiculopathy, cervical region: Secondary | ICD-10-CM | POA: Diagnosis not present

## 2019-05-02 DIAGNOSIS — Z471 Aftercare following joint replacement surgery: Secondary | ICD-10-CM | POA: Diagnosis not present

## 2019-05-02 DIAGNOSIS — I872 Venous insufficiency (chronic) (peripheral): Secondary | ICD-10-CM | POA: Diagnosis not present

## 2019-05-02 DIAGNOSIS — F329 Major depressive disorder, single episode, unspecified: Secondary | ICD-10-CM | POA: Diagnosis not present

## 2019-05-02 DIAGNOSIS — E785 Hyperlipidemia, unspecified: Secondary | ICD-10-CM | POA: Diagnosis not present

## 2019-05-02 DIAGNOSIS — F419 Anxiety disorder, unspecified: Secondary | ICD-10-CM | POA: Diagnosis not present

## 2019-05-02 DIAGNOSIS — Z950 Presence of cardiac pacemaker: Secondary | ICD-10-CM | POA: Diagnosis not present

## 2019-05-02 DIAGNOSIS — I10 Essential (primary) hypertension: Secondary | ICD-10-CM | POA: Diagnosis not present

## 2019-05-02 DIAGNOSIS — I442 Atrioventricular block, complete: Secondary | ICD-10-CM | POA: Diagnosis not present

## 2019-05-02 DIAGNOSIS — Z9011 Acquired absence of right breast and nipple: Secondary | ICD-10-CM | POA: Diagnosis not present

## 2019-05-02 DIAGNOSIS — L659 Nonscarring hair loss, unspecified: Secondary | ICD-10-CM | POA: Diagnosis not present

## 2019-05-02 DIAGNOSIS — Z96641 Presence of right artificial hip joint: Secondary | ICD-10-CM | POA: Diagnosis not present

## 2019-05-02 DIAGNOSIS — G4700 Insomnia, unspecified: Secondary | ICD-10-CM | POA: Diagnosis not present

## 2019-05-05 ENCOUNTER — Telehealth: Payer: Self-pay | Admitting: *Deleted

## 2019-05-05 DIAGNOSIS — I10 Essential (primary) hypertension: Secondary | ICD-10-CM | POA: Diagnosis not present

## 2019-05-05 DIAGNOSIS — I872 Venous insufficiency (chronic) (peripheral): Secondary | ICD-10-CM | POA: Diagnosis not present

## 2019-05-05 DIAGNOSIS — E785 Hyperlipidemia, unspecified: Secondary | ICD-10-CM | POA: Diagnosis not present

## 2019-05-05 DIAGNOSIS — Z471 Aftercare following joint replacement surgery: Secondary | ICD-10-CM | POA: Diagnosis not present

## 2019-05-05 DIAGNOSIS — M4722 Other spondylosis with radiculopathy, cervical region: Secondary | ICD-10-CM | POA: Diagnosis not present

## 2019-05-05 DIAGNOSIS — I442 Atrioventricular block, complete: Secondary | ICD-10-CM | POA: Diagnosis not present

## 2019-05-05 NOTE — Telephone Encounter (Signed)
Attempted 1 week post-discharge call to patient. No answer and message left on VM x 2 today. Will attempt again tomorrow on 05/06/19.

## 2019-05-06 ENCOUNTER — Telehealth: Payer: Self-pay | Admitting: *Deleted

## 2019-05-06 NOTE — Telephone Encounter (Signed)
1 week [facility] call completed.

## 2019-05-07 DIAGNOSIS — E785 Hyperlipidemia, unspecified: Secondary | ICD-10-CM | POA: Diagnosis not present

## 2019-05-07 DIAGNOSIS — I872 Venous insufficiency (chronic) (peripheral): Secondary | ICD-10-CM | POA: Diagnosis not present

## 2019-05-07 DIAGNOSIS — I442 Atrioventricular block, complete: Secondary | ICD-10-CM | POA: Diagnosis not present

## 2019-05-07 DIAGNOSIS — Z471 Aftercare following joint replacement surgery: Secondary | ICD-10-CM | POA: Diagnosis not present

## 2019-05-07 DIAGNOSIS — I10 Essential (primary) hypertension: Secondary | ICD-10-CM | POA: Diagnosis not present

## 2019-05-07 DIAGNOSIS — M4722 Other spondylosis with radiculopathy, cervical region: Secondary | ICD-10-CM | POA: Diagnosis not present

## 2019-05-09 DIAGNOSIS — I442 Atrioventricular block, complete: Secondary | ICD-10-CM | POA: Diagnosis not present

## 2019-05-09 DIAGNOSIS — E785 Hyperlipidemia, unspecified: Secondary | ICD-10-CM | POA: Diagnosis not present

## 2019-05-09 DIAGNOSIS — I872 Venous insufficiency (chronic) (peripheral): Secondary | ICD-10-CM | POA: Diagnosis not present

## 2019-05-09 DIAGNOSIS — M4722 Other spondylosis with radiculopathy, cervical region: Secondary | ICD-10-CM | POA: Diagnosis not present

## 2019-05-09 DIAGNOSIS — I10 Essential (primary) hypertension: Secondary | ICD-10-CM | POA: Diagnosis not present

## 2019-05-09 DIAGNOSIS — Z471 Aftercare following joint replacement surgery: Secondary | ICD-10-CM | POA: Diagnosis not present

## 2019-05-12 DIAGNOSIS — E785 Hyperlipidemia, unspecified: Secondary | ICD-10-CM | POA: Diagnosis not present

## 2019-05-12 DIAGNOSIS — I10 Essential (primary) hypertension: Secondary | ICD-10-CM | POA: Diagnosis not present

## 2019-05-12 DIAGNOSIS — Z471 Aftercare following joint replacement surgery: Secondary | ICD-10-CM | POA: Diagnosis not present

## 2019-05-12 DIAGNOSIS — M4722 Other spondylosis with radiculopathy, cervical region: Secondary | ICD-10-CM | POA: Diagnosis not present

## 2019-05-12 DIAGNOSIS — I872 Venous insufficiency (chronic) (peripheral): Secondary | ICD-10-CM | POA: Diagnosis not present

## 2019-05-12 DIAGNOSIS — I442 Atrioventricular block, complete: Secondary | ICD-10-CM | POA: Diagnosis not present

## 2019-05-13 ENCOUNTER — Encounter: Payer: Self-pay | Admitting: Orthopaedic Surgery

## 2019-05-13 ENCOUNTER — Other Ambulatory Visit: Payer: Self-pay

## 2019-05-13 ENCOUNTER — Ambulatory Visit (INDEPENDENT_AMBULATORY_CARE_PROVIDER_SITE_OTHER): Payer: Medicare Other | Admitting: Orthopaedic Surgery

## 2019-05-13 DIAGNOSIS — Z96641 Presence of right artificial hip joint: Secondary | ICD-10-CM

## 2019-05-13 NOTE — Care Plan (Signed)
RNCM met with patient today for her post-op appointment with Dr. Xu. She verbalized she is doing very well. She has been seen by HHPT and is now discharged from therm. She reports they have given her home exercises to do and she is very pleased with her overall pain and progress s/p right THA. She remains on Aspirin twice daily for DVT prophylaxis. Her stitches were removed today and steri strips placed. She will follow up with Dr. Xu in 4 weeks.  

## 2019-05-13 NOTE — Progress Notes (Signed)
Patient ID: Danielle [name], female   DOB: 1936/10/26, 83 y.o.   MRN: [id number]  Danielle [name] is 2 weeks right total hip placement.  She is doing very well.  She feels excellent.  She denies any significant hip pain.  She has noticed a significant improvement in her right hip pain.  She has already completed home health physical therapy and released to home exercises.  She is very happy.  Her surgical incision has healed without any signs of infection.  She has more minimal swelling.  Today we remove the sutures in place Steri-Strips.  Continue with aspirin for DVT prophylaxis.  Continue with home exercises for hip rehab.  Recheck in 4 weeks with standing AP pelvis xray.

## 2019-06-06 ENCOUNTER — Telehealth: Payer: Self-pay | Admitting: *Deleted

## 2019-06-06 NOTE — Care Plan (Signed)
Left a VM for patient in attempt to contact for 30 day follow up. RNCM was out of town last week. Will retry on Monday or see in office on Tuesday for her f/u with MD.

## 2019-06-06 NOTE — Telephone Encounter (Signed)
Ortho bundle call . 

## 2019-06-10 ENCOUNTER — Ambulatory Visit (INDEPENDENT_AMBULATORY_CARE_PROVIDER_SITE_OTHER): Payer: Medicare Other | Admitting: Physician Assistant

## 2019-06-10 ENCOUNTER — Encounter: Payer: Self-pay | Admitting: Orthopaedic Surgery

## 2019-06-10 ENCOUNTER — Ambulatory Visit (INDEPENDENT_AMBULATORY_CARE_PROVIDER_SITE_OTHER): Payer: Medicare Other

## 2019-06-10 DIAGNOSIS — Z96641 Presence of right artificial hip joint: Secondary | ICD-10-CM

## 2019-06-10 NOTE — Progress Notes (Signed)
Post-Op Visit Note   Patient: Danielle [name]           Date of Birth: 1936-02-24           MRN: [id number] Visit Date: 06/10/2019 PCP: Biagio Borg, MD   Assessment & Plan:  Chief Complaint:  Chief Complaint  Patient presents with  . Right Hip - Routine Post Op   Visit Diagnoses:  1. Status post total hip replacement, right     Plan: Patient is a pleasant 83 year old female presents our clinic today 6 weeks status post right anterior total hip replacement, date of surgery 04/28/2019.  She has been doing excellent since surgery.  She is regained full range of motion and strength.  She has no complaints.  Examination of her right hip reveals a fully healed surgical incision.  No evidence of infection or cellulitis.  At this point, she will continue with activities as tolerated.  Dental prophylaxis reinforced.  Follow-up with [name] in 6 weeks time for repeat evaluation.  Follow-Up Instructions: Return in about 6 weeks (around 07/22/2019).   Orders:  Orders Placed This Encounter  Procedures  . XR HIP UNILAT W OR W/O PELVIS 2-3 VIEWS RIGHT   No orders of the defined types were placed in this encounter.   Imaging: Xr Hip Unilat W Or W/o Pelvis 2-3 Views Right  Result Date: 06/10/2019 X-rays demonstrate a well-seated prosthesis without complication   PMFS History: Patient Active Problem List   Diagnosis Date Noted  . History of total hip replacement 04/28/2019  . Degenerative joint disease of right hip 12/17/2018  . Genetic testing 06/04/2017  . Malignant neoplasm of overlapping sites of right breast in female, estrogen receptor negative ([place]) 04/24/2017  . Chronic venous insufficiency 03/15/2017  . Subungual hematoma of foot 03/15/2017  . Dyspepsia 03/07/2017  . Nausea & vomiting 02/22/2017  . Cough 02/22/2017  . Hematuria 02/22/2017  . S/P right mastectomy 01/11/2017  . Breast cancer of upper-inner quadrant of right female breast ([place]) 10/06/2016  . Right hip pain  07/18/2016  . Olecranon bursitis of left elbow 05/05/2015  . Alopecia 01/21/2015  . Left leg swelling 01/21/2015  . Cervical radiculopathy 07/11/2013  . Preventative health care 08/03/2012  . Dizziness 08/03/2012  . Rash 03/26/2012  . Pacemaker-medtronic 01/23/2012  . Impaired glucose tolerance 07/28/2011  . OSTEOARTHRITIS, CERVICAL SPINE 02/25/2010  . Dizziness and giddiness 02/25/2010  . AV BLOCK, COMPLETE 12/16/2009  . INSOMNIA-SLEEP DISORDER-UNSPEC 06/15/2009  . Hypothyroidism 01/09/2008  . Hyperlipidemia 01/09/2008  . ANEMIA-NOS 01/09/2008  . ANXIETY 01/09/2008  . Depression 01/09/2008  . Essential hypertension 01/09/2008  . ALLERGIC RHINITIS 01/09/2008  . GERD 01/09/2008  . DVT, HX OF 01/09/2008  . NEPHROLITHIASIS, HX OF 01/09/2008   Past Medical History:  Diagnosis Date  . ALLERGIC RHINITIS 01/09/2008  . ANEMIA-NOS 01/09/2008  . ANXIETY 01/09/2008  . ASTHMATIC BRONCHITIS, ACUTE 10/20/2008  . AV BLOCK, COMPLETE 12/16/2009  . DEPRESSION 01/09/2008  . Dizziness and giddiness 02/25/2010  . DVT, HX OF 01/09/2008  . Genetic testing 06/04/2017   Ms. Qadir underwent genetic counseling and testing for hereditary cancer syndromes on 05/24/2017. Her results were negative for mutations in all 46 genes analyzed by [employer]'s 46-gene Common Hereditary Cancers Panel. Genes analyzed include: APC, ATM, AXIN2, BARD1, BMPR1A, BRCA1, BRCA2, BRIP1, CDH1, CDKN2A, CHEK2, CTNNA1, DICER1, EPCAM, GREM1, HOXB13, KIT, MEN1, MLH1, MSH2, MSH3, MSH6, MUTYH, NBN  . GERD 01/09/2008  . GLUCOSE INTOLERANCE 01/09/2008  . History of kidney stones   .  HYPERLIPIDEMIA 01/09/2008  . HYPERTENSION 01/09/2008  . HYPOTHYROIDISM 01/09/2008  . Impaired glucose tolerance 07/28/2011  . INSOMNIA-SLEEP DISORDER-UNSPEC 06/15/2009  . NEPHROLITHIASIS, HX OF 01/09/2008  . OSTEOARTHRITIS, CERVICAL SPINE 02/25/2010  . Presence of permanent cardiac pacemaker    medtronic    Family History  Problem Relation Age of Onset  . Sudden death  Mother        d.38s  . Breast cancer Mother 74  . Pulmonary embolism Father        Possible  . Lung cancer Paternal Grandfather        history of smoking  . Breast cancer Maternal Aunt        d.78s    Past Surgical History:  Procedure Laterality Date  . BREAST BIOPSY Right 09/20/2016   malignant  . ERCP W/ SPHICTEROTOMY    . HEMORRHOID SURGERY    . MASTECTOMY Right 01/11/2017  . MASTECTOMY W/ SENTINEL NODE BIOPSY Right 01/11/2017   Procedure: RIGHT MASTECTOMY WITH SENTINEL LYMPH NODE BIOPSY;  Surgeon: [name] Klein, MD;  Location: [facility];  Service: General;  Laterality: Right;  . PACEMAKER PLACEMENT     MedTronic EnPulse [id number]  . PERMANENT PACEMAKER GENERATOR CHANGE N/A 07/16/2012   Procedure: PERMANENT PACEMAKER GENERATOR CHANGE;  Surgeon: Deboraha Sprang, MD;  Location: [facility] CATH LAB;  Service: Cardiovascular;  Laterality: N/A;  . POLYPECTOMY     Uterine  . TOTAL HIP ARTHROPLASTY Right 04/28/2019  . TOTAL HIP ARTHROPLASTY Right 04/28/2019   Procedure: RIGHT TOTAL HIP ARTHROPLASTY ANTERIOR APPROACH;  Surgeon: Leandrew Koyanagi, MD;  Location: [facility];  Service: Orthopedics;  Laterality: Right;   Social History   Occupational History  . Not on file  Tobacco Use  . Smoking status: Former Smoker    Packs/day: 0.20    Years: 25.00    Pack years: 5.00    Types: Cigarettes    Quit date: 11/21/1971    Years since quitting: 47.5  . Smokeless tobacco: Never Used  Substance and Sexual Activity  . Alcohol use: Yes    Comment: wine  . Drug use: No  . Sexual activity: Not on file

## 2019-06-10 NOTE — Care Plan (Signed)
RNCM met with patient in office today during her 6 week follow up with Dr. Phoebe Sharps PA. She is doing very well and her surgical incision looks well healed. X-rays done in office today. Having no pain in the right hip. Some mild arthritis noted in the left hip per PA, but patient states it only minimally bothers her. She is very pleased with her results.Reviewed 30 day survey with patient as RNCM was out of town for more than a week at her 30 day date, and an attempt made last week to reach patient to review the survey, was unsuccessful. Patient satisfaction survey provided by [facility] is new and not currently within Epic, which has replaced the previous 10 question survey.  30 day Patient Satisfaction Survey: 1. Before surgery, I was provided sufficient education regarding my surgery and the bundle program. Patient's answer- Strongly agree 2. I was satisfied with the care provided by the nurse at the facility where my surgery was performed. Patient's answer-Strongly agree 3. Following surgery, I received sufficient postoperative care instructions. Patient's answer-Strongly agree 4. I would recommend my surgeon and this bundle program to others. Patient's answer- Strongly agree

## 2019-06-12 ENCOUNTER — Telehealth: Payer: Self-pay | Admitting: Internal Medicine

## 2019-06-12 MED ORDER — ATENOLOL 50 MG PO TABS: 50.0000 mg | ORAL_TABLET | Freq: Every day | ORAL | 0 refills | Status: DC

## 2019-06-12 NOTE — Telephone Encounter (Signed)
Medication Refill - Medication:  atenolol (TENORMIN) 50 MG tablet  Has the patient contacted their pharmacy? Yes pharmacy reaching out.   Preferred Pharmacy (with phone number or street name):  [id number] - [place], [place] [address] AT [place] [phone] (Phone) [phone] (Fax)   Agent: Please be advised that RX refills may take up to 3 business days. We ask that you follow-up with your pharmacy.

## 2019-06-13 ENCOUNTER — Telehealth: Payer: Self-pay | Admitting: Internal Medicine

## 2019-06-13 NOTE — Telephone Encounter (Signed)
Medication Refill - Medication: The pharmacy called to get medications refilled or do an e script. Please call the [email or web address] back   Preferred Pharmacy (with phone number or street name):  [id number] - [place], [place] [address] AT [place] [phone] (Phone) [phone] (Fax)     Agent: Please be advised that RX refills may take up to 3 business days. We ask that you follow-up with your pharmacy.

## 2019-06-19 DIAGNOSIS — Z9011 Acquired absence of right breast and nipple: Secondary | ICD-10-CM

## 2019-06-19 DIAGNOSIS — Z86718 Personal history of other venous thrombosis and embolism: Secondary | ICD-10-CM

## 2019-06-19 DIAGNOSIS — F329 Major depressive disorder, single episode, unspecified: Secondary | ICD-10-CM

## 2019-06-19 DIAGNOSIS — M4722 Other spondylosis with radiculopathy, cervical region: Secondary | ICD-10-CM

## 2019-06-19 DIAGNOSIS — Z950 Presence of cardiac pacemaker: Secondary | ICD-10-CM

## 2019-06-19 DIAGNOSIS — G4700 Insomnia, unspecified: Secondary | ICD-10-CM

## 2019-06-19 DIAGNOSIS — Z471 Aftercare following joint replacement surgery: Secondary | ICD-10-CM

## 2019-06-19 DIAGNOSIS — L659 Nonscarring hair loss, unspecified: Secondary | ICD-10-CM

## 2019-06-19 DIAGNOSIS — E785 Hyperlipidemia, unspecified: Secondary | ICD-10-CM

## 2019-06-19 DIAGNOSIS — I872 Venous insufficiency (chronic) (peripheral): Secondary | ICD-10-CM | POA: Diagnosis not present

## 2019-06-19 DIAGNOSIS — E039 Hypothyroidism, unspecified: Secondary | ICD-10-CM

## 2019-06-19 DIAGNOSIS — F419 Anxiety disorder, unspecified: Secondary | ICD-10-CM

## 2019-06-19 DIAGNOSIS — Z96641 Presence of right artificial hip joint: Secondary | ICD-10-CM

## 2019-06-19 DIAGNOSIS — Z853 Personal history of malignant neoplasm of breast: Secondary | ICD-10-CM

## 2019-06-19 DIAGNOSIS — I442 Atrioventricular block, complete: Secondary | ICD-10-CM

## 2019-06-19 DIAGNOSIS — I10 Essential (primary) hypertension: Secondary | ICD-10-CM

## 2019-06-19 DIAGNOSIS — K219 Gastro-esophageal reflux disease without esophagitis: Secondary | ICD-10-CM | POA: Diagnosis not present

## 2019-07-22 ENCOUNTER — Telehealth: Payer: Self-pay | Admitting: Oncology

## 2019-07-22 ENCOUNTER — Ambulatory Visit: Payer: Medicare Other | Admitting: Orthopaedic Surgery

## 2019-07-22 NOTE — Telephone Encounter (Signed)
Confirmed phone visit with pt on 9/2 and verified demographics

## 2019-07-22 NOTE — Progress Notes (Signed)
[place]  [phone] Fax:(336) [phone]     ID: KENEDEE MOLESKY DOB: 1936-03-24  MR#: [id number]  TDS#:[id number]  Patient Care Team: Danielle Borg, [name] as PCP - Danielle Lather Revonda Standard, [name] as PCP - Cardiology (Cardiology) Danielle Kussmaul, [name] as Consulting Physician (General Surgery) Danielle [name], Virgie Dad, [name] as Consulting Physician (Oncology) Danielle Artist, [name] as Consulting Physician (Gastroenterology) Danielle Sprang, [name] as Consulting Physician (Cardiology) Danielle Holter, NP as Nurse Practitioner (Nurse Practitioner) Danielle Morgan, [name] as Referring Physician (Orthopedic Surgery) Danielle [name]:  I connected with Danielle [name] on 07/23/19 at  2:30 PM EDT by telephone visit and verified that I am speaking with the correct person using two identifiers.   I discussed the limitations, risks, security and privacy concerns of performing an evaluation and management service by telemedicine and the availability of in-person appointments. I also discussed with the patient that there may be a patient responsible charge related to this service. The patient expressed understanding and agreed to proceed.   Danielle persons participating in the visit and their role in the encounter: none  Patient's location: Home Provider's location: Clinic  CHIEF COMPLAINT: Triple negative breast cancer   CURRENT TREATMENT: Observation   BREAST CANCER HISTORY: From the original intake note:  Danielle [name] herself noted a change in her right breast sometime in September 2017. She brought this to medical attention and on 09/19/2016 had diagnostic bilateral mammography with tomography and [facility]. The breast density was category D. There was an irregular mass in the upper central breast. This was spiculated and measured at least 2 cm. There were also numerous linear calcifications in the upper outer quadrant  of the right breast spanning a minimum of 2.0 cm. On physical exam there was a rounded mobile mass in the upper inner quadrant of the right breast. Ultrasound of the right breast confirmed an irregular hypoechoic mass at the 12:30 o'clock position 5 cm from the nipple measuring 1.5 cm, with a 0.7 cm satellite nodule.  On 09/20/2016 the patient underwent biopsy of the right breast 2. Biopsy #1 described as upper outer quadrant showed high-grade ductal carcinoma in situ estrogen and progesterone receptor negative. Right breast biopsy #2 described as 12:30 o'clock showed invasive ductal carcinoma, estrogen and progesterone receptor negative, HER-2 not amplified with an MIB-1 of 1.24 and the number per cell 1.80 , and the MIB-1  was 80%.  The patient met with my partner Dr. Sonny [name] January 2018. He recommended Taxotere and Cytoxan chemotherapy for 4 cycles after her surgery.  On 01/11/2017 the patient underwent right mastectomy with sentinel lymph node biopsy. The final pathology here ([id number]) showed in the right breast, invasive ductal carcinoma, grade 3, measuring 2.3 cm with negative margins. All 8 lymph nodes examined, including 3 sentinel lymph nodes, were clear.  The patient declined a follow-up appointment with Dr. Sonny [name].She was scheduled to see me 03/12/2017 but did not show.  Her subsequent history is as detailed below   INTERVAL HISTORY: Danielle [name] is contacted today for follow-up and treatment of her triple negative breast cancer. She was last seen here on 07/24/2018.   She continues under observation.  After her visit here she has changed her diet is eating a lot of vegetables she tells me this is working wonders and her energy level and is also helping her husband.  She is behind on her mammography, which was last completed  on 07/08/2018 at [facility].   Since her last visit here, she underwent an abdomen and pelvis CT with contrast for abdominal pain with nausea, vomiting, and  diarrhea on 08/19/2018 showing: No acute abnormality. Interval mild diffuse pancreatic atrophy and minimally dilated pancreatic duct. Stable chronically dilated common duct and central intrahepatic ducts. 5 mm nonobstructing lower pole right renal calculus.  She also underwent a bilateral hip xray on 12/25/2018 showing: Advanced degenerative joint disease with bone-on-bone joint space narrowing and subchondral cystic formation in the right hip.  Moderate degenerative joint disease of the left hip.  She then underwent a chest xray on 04/24/2019 for her osteoarthritis showing: Cardiac pacer with lead tip over the right atrium right ventricle. No acute cardiopulmonary disease. Prior right mastectomy.  Prominent nipple shadow noted on left.  She underwent a right total hip arthroplasty on 04/28/2019.  She did remarkably well with the surgery and tells me she has absolutely no pain at present   REVIEW OF SYSTEMS: Danielle [name] has 2 horses, both [name]'s, a mayor and her full.  They are more pets than anything else at this point.  She is very busy with gardening, cleaning up the acres they have and of course doing her housework and cooking.  They are being careful regarding the pandemic and basically she does not go much of anywhere at present Danielle than doctor's appointments and minimal shopping.  A detailed review of systems was otherwise noncontributory   PAST MEDICAL HISTORY: Past Medical History:  Diagnosis Date  . ALLERGIC RHINITIS 01/09/2008  . ANEMIA-NOS 01/09/2008  . ANXIETY 01/09/2008  . ASTHMATIC BRONCHITIS, ACUTE 10/20/2008  . AV BLOCK, COMPLETE 12/16/2009  . DEPRESSION 01/09/2008  . Dizziness and giddiness 02/25/2010  . DVT, HX OF 01/09/2008  . Genetic testing 06/04/2017   Danielle [name] underwent genetic counseling and testing for hereditary cancer syndromes on 05/24/2017. Her results were negative for mutations in all 46 genes analyzed by [facility]'s 46-gene Common Hereditary Cancers Panel. Genes analyzed  include: APC, ATM, AXIN2, BARD1, BMPR1A, BRCA1, BRCA2, BRIP1, CDH1, CDKN2A, CHEK2, CTNNA1, DICER1, EPCAM, GREM1, HOXB13, KIT, MEN1, MLH1, MSH2, MSH3, MSH6, MUTYH, NBN  . GERD 01/09/2008  . GLUCOSE INTOLERANCE 01/09/2008  . History of kidney stones   . HYPERLIPIDEMIA 01/09/2008  . HYPERTENSION 01/09/2008  . HYPOTHYROIDISM 01/09/2008  . Impaired glucose tolerance 07/28/2011  . INSOMNIA-SLEEP DISORDER-UNSPEC 06/15/2009  . NEPHROLITHIASIS, HX OF 01/09/2008  . OSTEOARTHRITIS, CERVICAL SPINE 02/25/2010  . Presence of permanent cardiac pacemaker    medtronic    PAST SURGICAL HISTORY: Past Surgical History:  Procedure Laterality Date  . BREAST BIOPSY Right 09/20/2016   malignant  . ERCP W/ SPHICTEROTOMY    . HEMORRHOID SURGERY    . MASTECTOMY Right 01/11/2017  . MASTECTOMY W/ SENTINEL NODE BIOPSY Right 01/11/2017   Procedure: RIGHT MASTECTOMY WITH SENTINEL LYMPH NODE BIOPSY;  Surgeon: [name] Klein, [name];  Location: [facility];  Service: General;  Laterality: Right;  . PACEMAKER PLACEMENT     MedTronic EnPulse [id number]  . PERMANENT PACEMAKER GENERATOR CHANGE N/A 07/16/2012   Procedure: PERMANENT PACEMAKER GENERATOR CHANGE;  Surgeon: Danielle Sprang, [name];  [facility] C[facility] LAB;  Service: Cardiovascular;  Laterality: N/A;  . POLYPECTOMY     Uterine  . TOTAL HIP ARTHROPLASTY Right 04/28/2019  . TOTAL HIP ARTHROPLASTY Right 04/28/2019   Procedure: RIGHT TOTAL HIP ARTHROPLASTY ANTERIOR APPROACH;  Surgeon: Leandrew Koyanagi, [name];  Location: [facility];  Service: Orthopedics;  Laterality: Right;    FAMILY HISTORY Family  History  Problem Relation Age of Onset  . Sudden death Mother        d.83s  . Breast cancer Mother 103  . Pulmonary embolism Father        Possible  . Lung cancer Paternal Grandfather        history of smoking  . Breast cancer Maternal Aunt        d.78s  The patient's father died in his 61s from a "clot to the heart". The patient's mother was diagnosed with breast cancer in her early 102s and died  from metastatic breast cancer approximately 30 years later. A maternal aunt (her mother's only sister) was also diagnosed with breast cancer in her early 63s according to the patient. The patient had 2 brothers, 3 sisters. There is no Danielle history of breast or ovarian cancer in the family to her knowledge  GYNECOLOGIC HISTORY:  No LMP recorded. Patient is postmenopausal. She can't remember age at menarche or time of menopause. She is GX P5, including one premature birth. 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 Her husband Jonne Ply") is a Psychologist, sport and exercise. 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 The patient's son Myles Rosenthal lives in [place] where he works as an Arboriculturist. Daughter Clyde Canterbury lives in [place] and is a Scientist, research (life sciences). Daughter [name] Friendly lives in [place] and is an Training and development officer as well as a third Land. Son [name] Klein was born premature and did not survive. Daughter Luellen Pucker lives in [place] where she is a Art gallery manager with a fine arts degree. The patient has 4 grandchildren. She is a Nurse, learning disability.    ADVANCED DIRECTIVES: In place   HEALTH MAINTENANCE: Social History   Tobacco Use  . Smoking status: Former Smoker    Packs/day: 0.20    Years: 25.00    Pack years: 5.00    Types: Cigarettes    Quit date: 11/21/1971    Years since quitting: 47.7  . Smokeless tobacco: Never Used  Substance Use Topics  . Alcohol use: Yes    Comment: wine  . Drug use: No     Colonoscopy: Due  PAP:  Bone density:   Allergies  Allergen Reactions  . Codeine Nausea And Vomiting  . Oxycodone Nausea And Vomiting    Current Outpatient Medications  Medication Sig Dispense Refill  . aspirin EC 81 MG tablet Take 1 tablet (81 mg total) by mouth 2 (two) times daily. 84 tablet 0  . atenolol (TENORMIN) 50 MG tablet Take 1 tablet (50 mg total) by mouth daily. Must keep follow-up  appt in July for future refills 90 tablet 0  . atorvastatin (LIPITOR) 10 MG tablet TAKE 1 TABLET(10 MG) BY MOUTH DAILY (Patient taking differently: Take 10 mg by mouth daily. ) 90 tablet 0  . azelastine (ASTELIN) 0.1 % nasal spray Place 1 spray into both nostrils 2 (two) times daily. Use in each nostril as directed (Patient taking differently: Place 2 sprays into both nostrils daily as needed for allergies. ) 30 mL 11  . calcium-vitamin D (OSCAL WITH D) 500-200 MG-UNIT tablet Take 1 tablet by mouth daily with breakfast.    . enalapril (VASOTEC) 10 MG tablet Take 1 tablet (10 mg total) by mouth daily. Must keep follow-up appt in July for future refills 90 tablet 0  . HYDROcodone-acetaminophen (NORCO) 10-325 MG tablet Take 1-2 tablets  by mouth 3 (three) times daily as needed for moderate pain or severe pain. 30 tablet 0  . levothyroxine (SYNTHROID) 112 MCG tablet Take 1 tablet (112 mcg total) by mouth daily before breakfast. Must keep follow-up appt in July for future refills 90 tablet 0  . methocarbamol (ROBAXIN) 500 MG tablet Take 1 tablet (500 mg total) by mouth every 6 (six) hours as needed for muscle spasms. 30 tablet 2  . ondansetron (ZOFRAN) 4 MG tablet Take 1-2 tablets (4-8 mg total) by mouth every 8 (eight) hours as needed for nausea or vomiting. 40 tablet 0  . pantoprazole (PROTONIX) 40 MG tablet Take 1 tablet (40 mg total) by mouth as needed. (Patient not taking: Reported on 04/22/2019) 90 tablet 0  . promethazine (PHENERGAN) 25 MG tablet Take 1 tablet (25 mg total) by mouth every 6 (six) hours as needed for nausea. 30 tablet 1  . senna-docusate (SENOKOT S) 8.6-50 MG tablet Take 1-2 tablets by mouth at bedtime as needed. 30 tablet 1   No current facility-administered medications for this visit.     OBJECTIVE: Older white woman in no acute distress  There were no vitals filed for this visit. Wt Readings from Last 3 Encounters:  04/28/19 108 lb 11.2 oz (49.3 kg)  04/24/19 108 lb 11.2 oz  (49.3 kg)  01/07/19 112 lb 12.8 oz (51.2 kg)   There is no height or weight on file to calculate BMI.    ECOG FS:1 - Symptomatic but completely ambulatory  Telehealth Visit   LAB RESULTS:  CMP     Component Value Date/Time   NA 139 04/29/2019 0609   NA 140 07/25/2017 1347   K 3.8 04/29/2019 0609   K 4.3 07/25/2017 1347   CL 108 04/29/2019 0609   CO2 24 04/29/2019 0609   CO2 26 07/25/2017 1347   GLUCOSE 110 (H) 04/29/2019 0609   GLUCOSE 77 07/25/2017 1347   BUN 19 04/29/2019 0609   BUN 27.4 (H) 07/25/2017 1347   CREATININE 1.14 (H) 04/29/2019 0609   CREATININE 1.1 07/25/2017 1347   CALCIUM 8.7 (L) 04/29/2019 0609   CALCIUM 9.8 07/25/2017 1347   PROT 6.2 (L) 04/24/2019 1109   PROT 6.6 07/25/2017 1347   ALBUMIN 4.0 04/24/2019 1109   ALBUMIN 4.0 07/25/2017 1347   AST 20 04/24/2019 1109   AST 29 07/25/2017 1347   ALT 14 04/24/2019 1109   ALT 28 07/25/2017 1347   ALKPHOS 73 04/24/2019 1109   ALKPHOS 75 07/25/2017 1347   BILITOT 1.2 04/24/2019 1109   BILITOT 1.21 (H) 07/25/2017 1347   GFRNONAA 45 (L) 04/29/2019 0609   GFRNONAA 57 (L) 07/15/2012 1714   GFRAA 52 (L) 04/29/2019 0609   GFRAA 66 07/15/2012 1714    No results found for: TOTALPROTELP, ALBUMINELP, A1GS, A2GS, BETS, BETA2SER, GAMS, MSPIKE, SPEI  No results found for: KPAFRELGTCHN, LAMBDASER, [facility]  Lab Results  Component Value Date   WBC 6.7 04/29/2019   NEUTROABS 3.4 04/24/2019   HGB 9.0 (L) 04/29/2019   HCT 27.4 (L) 04/29/2019   MCV 92.9 04/29/2019   PLT 104 (L) 04/29/2019      Chemistry      Component Value Date/Time   NA 139 04/29/2019 0609   NA 140 07/25/2017 1347   K 3.8 04/29/2019 0609   K 4.3 07/25/2017 1347   CL 108 04/29/2019 0609   CO2 24 04/29/2019 0609   CO2 26 07/25/2017 1347   BUN 19 04/29/2019 0609   BUN 27.4 (H)  07/25/2017 1347   CREATININE 1.14 (H) 04/29/2019 0609   CREATININE 1.1 07/25/2017 1347      Component Value Date/Time   CALCIUM 8.7 (L) 04/29/2019 0609    CALCIUM 9.8 07/25/2017 1347   ALKPHOS 73 04/24/2019 1109   ALKPHOS 75 07/25/2017 1347   AST 20 04/24/2019 1109   AST 29 07/25/2017 1347   ALT 14 04/24/2019 1109   ALT 28 07/25/2017 1347   BILITOT 1.2 04/24/2019 1109   BILITOT 1.21 (H) 07/25/2017 1347       No results found for: LABCA2  No components found for: [id number]  No results for input(s): INR in the last 168 hours.  Urinalysis    Component Value Date/Time   COLORURINE YELLOW 08/19/2018 1815   APPEARANCEUR CLEAR 08/19/2018 1815   LABSPEC 1.014 08/19/2018 1815   PHURINE 7.0 08/19/2018 1815   GLUCOSEU NEGATIVE 08/19/2018 1815   GLUCOSEU NEGATIVE 03/01/2018 1441   HGBUR NEGATIVE 08/19/2018 1815   BILIRUBINUR NEGATIVE 08/19/2018 1815   KETONESUR 5 (A) 08/19/2018 1815   PROTEINUR NEGATIVE 08/19/2018 1815   UROBILINOGEN 0.2 03/01/2018 1441   NITRITE NEGATIVE 08/19/2018 1815   LEUKOCYTESUR NEGATIVE 08/19/2018 1815     STUDIES: No results found.  ELIGIBLE FOR AVAILABLE RESEARCH PROTOCOL: no  ASSESSMENT: 83 y.o. [place] woman woman status post right breast overlapping site biopsies 09/20/2016 showing (a) invasive ductal carcinoma, estrogen and progesterone receptor negative, with an MIB-1 of 80%, and no HER-2 amplification. (b) ductal carcinoma in situ, high-grade, estrogen and progesterone receptor negative  (1) status post right mastectomy with sentinel lymph node and axillary lymph node sampling 01/11/2017 for a pT2 pN0, stage IIB invasive ductal carcinoma, grade 3, with negative margins  (2) genetics testing 05/24/2017 through [facility]'s Common Hereditary Cancers Panel found no deleterious mutations in APC, ATM, AXIN2, BARD1, BMPR1A, BRCA1, BRCA2, BRIP1, CDH1, CDKN2A, CHEK2, CTNNA1, DICER1, EPCAM, GREM1, HOXB13, KIT, MEN1, MLH1, MSH2, MSH3, MSH6, MUTYH, NBN, NF1, NTHL1, PALB2, PDGFRA, PMS2, POLD1, POLE, PTEN, RAD50, RAD51C, RAD51D, SDHA, SDHB, SDHC, SDHD, SMAD4, SMARCA4, STK11, TP53, TSC1, TSC2, and VHL.  (3) the  patient opted against adjuvant chemotherapy  PLAN: Danielle [name] is now 2-1/2 years out from definitive surgery for her breast cancer with no evidence of disease recurrence.  This is very favorable.  She has opted for observation alone but she is doing a good diet and exercise program.  She is a bit behind on her mammography and I am writing the order for her to have a mammogram at the breast center sometime within the next month or 2.  She did excellently by her account on her right hip arthroplasty and is delighted with the care she is receiving from Dr. Erlinda Hong  She knows to call for any Danielle issue that may develop before her next visit here which will be in 1 year  Danielle [name], Virgie Dad, [name]  07/23/19 2:38 PM Medical Oncology and Hematology [facility] [place], [place] [address] Tel. [phone]    Fax. [phone]  I, Jacqualyn Posey am acting as a Education administrator for Chauncey Cruel, [name].   I, Lurline Del [name], have reviewed the above documentation for accuracy and completeness, and I agree with the above.

## 2019-07-22 NOTE — Telephone Encounter (Signed)
Called patient regarding upcoming Webex appointment, left a voicemail. This will be a phone visit due to no communication to set this up as virtual.

## 2019-07-23 ENCOUNTER — Other Ambulatory Visit: Payer: Medicare Other

## 2019-07-23 ENCOUNTER — Inpatient Hospital Stay: Payer: Medicare Other | Attending: Oncology | Admitting: Oncology

## 2019-07-23 ENCOUNTER — Other Ambulatory Visit: Payer: Self-pay | Admitting: Oncology

## 2019-07-23 DIAGNOSIS — C50811 Malignant neoplasm of overlapping sites of right female breast: Secondary | ICD-10-CM | POA: Diagnosis not present

## 2019-07-23 DIAGNOSIS — Z1231 Encounter for screening mammogram for malignant neoplasm of breast: Secondary | ICD-10-CM

## 2019-07-23 DIAGNOSIS — Z171 Estrogen receptor negative status [ER-]: Secondary | ICD-10-CM | POA: Diagnosis not present

## 2019-07-24 ENCOUNTER — Telehealth: Payer: Self-pay | Admitting: Oncology

## 2019-07-24 NOTE — Telephone Encounter (Signed)
I talk with patient regarding schedule  

## 2019-07-29 ENCOUNTER — Encounter: Payer: Self-pay | Admitting: Orthopaedic Surgery

## 2019-07-29 ENCOUNTER — Ambulatory Visit (INDEPENDENT_AMBULATORY_CARE_PROVIDER_SITE_OTHER): Payer: Medicare Other | Admitting: Physician Assistant

## 2019-07-29 DIAGNOSIS — Z96641 Presence of right artificial hip joint: Secondary | ICD-10-CM

## 2019-07-29 NOTE — Progress Notes (Signed)
Post-Op Visit Note   Patient: Danielle [name]           Date of Birth: 1936-02-06           MRN: [id number] Visit Date: 07/29/2019 PCP: Danielle Borg, MD   Assessment & Plan:  Chief Complaint:  Chief Complaint  Patient presents with  . Right Hip - Follow-up   Visit Diagnoses:  1. History of total hip replacement, right     Plan: Patient is a pleasant 83 year old female who presents our clinic today 3 months status post right anterior total hip replacement, date of surgery 04/28/2019.  She has been doing excellent.  No complaints.  She is regained full range of motion and strength.  Examination right hip reveals a fully healed surgical scar.  No evidence of infection or cellulitis.  Full range of motion and strength of the right hip.  Negative logroll.  Calves are soft nontender.  At this point, she will continue to advance with activity as tolerated.  Follow-up with [name] in 6 months time for repeat evaluation and AP pelvis lateral right hip x-rays.  Call with concerns or questions in meantime.  Follow-Up Instructions: Return in about 3 months (around 10/28/2019).   Orders:  No orders of the defined types were placed in this encounter.  No orders of the defined types were placed in this encounter.   Imaging: No new imaging  PMFS History: Patient Active Problem List   Diagnosis Date Noted  . History of total hip replacement 04/28/2019  . Degenerative joint disease of right hip 12/17/2018  . Genetic testing 06/04/2017  . Malignant neoplasm of overlapping sites of right breast in female, estrogen receptor negative ([place]) 04/24/2017  . Chronic venous insufficiency 03/15/2017  . Subungual hematoma of foot 03/15/2017  . Dyspepsia 03/07/2017  . Nausea & vomiting 02/22/2017  . Cough 02/22/2017  . Hematuria 02/22/2017  . S/P right mastectomy 01/11/2017  . Breast cancer of upper-inner quadrant of right female breast ([place]) 10/06/2016  . Right hip pain 07/18/2016  . Olecranon  bursitis of left elbow 05/05/2015  . Alopecia 01/21/2015  . Left leg swelling 01/21/2015  . Cervical radiculopathy 07/11/2013  . Preventative health care 08/03/2012  . Dizziness 08/03/2012  . Rash 03/26/2012  . Pacemaker-medtronic 01/23/2012  . Impaired glucose tolerance 07/28/2011  . OSTEOARTHRITIS, CERVICAL SPINE 02/25/2010  . Dizziness and giddiness 02/25/2010  . AV BLOCK, COMPLETE 12/16/2009  . INSOMNIA-SLEEP DISORDER-UNSPEC 06/15/2009  . Hypothyroidism 01/09/2008  . Hyperlipidemia 01/09/2008  . ANEMIA-NOS 01/09/2008  . ANXIETY 01/09/2008  . Depression 01/09/2008  . Essential hypertension 01/09/2008  . ALLERGIC RHINITIS 01/09/2008  . GERD 01/09/2008  . DVT, HX OF 01/09/2008  . NEPHROLITHIASIS, HX OF 01/09/2008   Past Medical History:  Diagnosis Date  . ALLERGIC RHINITIS 01/09/2008  . ANEMIA-NOS 01/09/2008  . ANXIETY 01/09/2008  . ASTHMATIC BRONCHITIS, ACUTE 10/20/2008  . AV BLOCK, COMPLETE 12/16/2009  . DEPRESSION 01/09/2008  . Dizziness and giddiness 02/25/2010  . DVT, HX OF 01/09/2008  . Genetic testing 06/04/2017   Danielle [name] underwent genetic counseling and testing for hereditary cancer syndromes on 05/24/2017. Her results were negative for mutations in all 46 genes analyzed by [facility]'s 46-gene Common Hereditary Cancers Panel. Genes analyzed include: APC, ATM, AXIN2, BARD1, BMPR1A, BRCA1, BRCA2, BRIP1, CDH1, CDKN2A, CHEK2, CTNNA1, DICER1, EPCAM, GREM1, HOXB13, KIT, MEN1, MLH1, MSH2, MSH3, MSH6, MUTYH, NBN  . GERD 01/09/2008  . GLUCOSE INTOLERANCE 01/09/2008  . History of kidney stones   . HYPERLIPIDEMIA  01/09/2008  . HYPERTENSION 01/09/2008  . HYPOTHYROIDISM 01/09/2008  . Impaired glucose tolerance 07/28/2011  . INSOMNIA-SLEEP DISORDER-UNSPEC 06/15/2009  . NEPHROLITHIASIS, HX OF 01/09/2008  . OSTEOARTHRITIS, CERVICAL SPINE 02/25/2010  . Presence of permanent cardiac pacemaker    medtronic    Family History  Problem Relation Age of Onset  . Sudden death Mother        d.73s  .  Breast cancer Mother 14  . Pulmonary embolism Father        Possible  . Lung cancer Paternal Grandfather        history of smoking  . Breast cancer Maternal Aunt        d.78s    Past Surgical History:  Procedure Laterality Date  . BREAST BIOPSY Right 09/20/2016   malignant  . ERCP W/ SPHICTEROTOMY    . HEMORRHOID SURGERY    . MASTECTOMY Right 01/11/2017  . MASTECTOMY W/ SENTINEL NODE BIOPSY Right 01/11/2017   Procedure: RIGHT MASTECTOMY WITH SENTINEL LYMPH NODE BIOPSY;  Surgeon: Danielle Klein, MD;  Location: [place] [facility];  Service: General;  Laterality: Right;  . PACEMAKER PLACEMENT     MedTronic EnPulse [id number]  . PERMANENT PACEMAKER GENERATOR CHANGE N/A 07/16/2012   Procedure: PERMANENT PACEMAKER GENERATOR CHANGE;  Surgeon: Danielle Sprang, MD;  Location: [facility] CATH LAB;  Service: Cardiovascular;  Laterality: N/A;  . POLYPECTOMY     Uterine  . TOTAL HIP ARTHROPLASTY Right 04/28/2019  . TOTAL HIP ARTHROPLASTY Right 04/28/2019   Procedure: RIGHT TOTAL HIP ARTHROPLASTY ANTERIOR APPROACH;  Surgeon: Danielle Koyanagi, MD;  Location: [place];  Service: Orthopedics;  Laterality: Right;   Social History   Occupational History  . Not on file  Tobacco Use  . Smoking status: Former Smoker    Packs/day: 0.20    Years: 25.00    Pack years: 5.00    Types: Cigarettes    Quit date: 11/21/1971    Years since quitting: 47.7  . Smokeless tobacco: Never Used  Substance and Sexual Activity  . Alcohol use: Yes    Comment: wine  . Drug use: No  . Sexual activity: Not on file

## 2019-08-01 ENCOUNTER — Telehealth: Payer: Self-pay | Admitting: *Deleted

## 2019-08-01 NOTE — Care Plan (Signed)
RNCM called patient after seeing her last week for her 3 month follow up in office. Reviewed today the 76 day [employer]os, Jr. Survey with her. Patient remains very pleased with her progress after surgery. Reminded to contact RNCM with any further questions or concerns.

## 2019-08-01 NOTE — Telephone Encounter (Signed)
90 day Ortho bundle call and survey completed. 

## 2019-08-11 ENCOUNTER — Telehealth: Payer: Self-pay

## 2019-08-11 MED ORDER — ATENOLOL 50 MG PO TABS: 50.0000 mg | ORAL_TABLET | Freq: Every day | ORAL | 0 refills | Status: DC

## 2019-08-11 MED ORDER — ATORVASTATIN CALCIUM 10 MG PO TABS: ORAL_TABLET | ORAL | 0 refills | Status: DC

## 2019-08-11 MED ORDER — LEVOTHYROXINE SODIUM 112 MCG PO TABS: 112.0000 ug | ORAL_TABLET | Freq: Every day | ORAL | 0 refills | Status: DC

## 2019-08-11 MED ORDER — ENALAPRIL MALEATE 10 MG PO TABS: 10.0000 mg | ORAL_TABLET | Freq: Every day | ORAL | 0 refills | Status: DC

## 2019-08-11 NOTE — Telephone Encounter (Signed)
Med refills have been sent in.  He stated 9/22 appt had to be cancelled earlier per husbands request.   Copied from [place] [phone]. Topic: General - Call Back - No Documentation >> Aug 11, 2019  3:23 PM Erick Blinks wrote: Pt's husband is requesting a call back to discuss Rx questions. [phone]

## 2019-08-12 ENCOUNTER — Ambulatory Visit: Payer: Medicare Other | Admitting: Internal Medicine

## 2019-08-14 ENCOUNTER — Ambulatory Visit: Payer: Medicare Other

## 2019-08-29 ENCOUNTER — Other Ambulatory Visit: Payer: Self-pay

## 2019-08-29 DIAGNOSIS — Z20822 Contact with and (suspected) exposure to covid-19: Secondary | ICD-10-CM

## 2019-08-30 LAB — NOVEL CORONAVIRUS, NAA: SARS-CoV-2, NAA: NOT DETECTED

## 2019-09-09 ENCOUNTER — Telehealth: Payer: Self-pay | Admitting: General Practice

## 2019-09-09 NOTE — Telephone Encounter (Signed)
Pt called in for covid result.  °Advised of Not Detected result.  °

## 2019-09-11 ENCOUNTER — Encounter: Payer: Self-pay | Admitting: Internal Medicine

## 2019-09-11 ENCOUNTER — Other Ambulatory Visit (INDEPENDENT_AMBULATORY_CARE_PROVIDER_SITE_OTHER): Payer: Medicare Other

## 2019-09-11 ENCOUNTER — Other Ambulatory Visit: Payer: Self-pay

## 2019-09-11 ENCOUNTER — Ambulatory Visit (INDEPENDENT_AMBULATORY_CARE_PROVIDER_SITE_OTHER): Payer: Medicare Other | Admitting: Internal Medicine

## 2019-09-11 ENCOUNTER — Other Ambulatory Visit: Payer: Self-pay | Admitting: Internal Medicine

## 2019-09-11 VITALS — BP 140/80 | HR 81 | Temp 98.3°F | Ht 65.0 in | Wt 109.2 lb

## 2019-09-11 DIAGNOSIS — E559 Vitamin D deficiency, unspecified: Secondary | ICD-10-CM

## 2019-09-11 DIAGNOSIS — E538 Deficiency of other specified B group vitamins: Secondary | ICD-10-CM

## 2019-09-11 DIAGNOSIS — R739 Hyperglycemia, unspecified: Secondary | ICD-10-CM

## 2019-09-11 DIAGNOSIS — Z23 Encounter for immunization: Secondary | ICD-10-CM

## 2019-09-11 DIAGNOSIS — E039 Hypothyroidism, unspecified: Secondary | ICD-10-CM | POA: Diagnosis not present

## 2019-09-11 DIAGNOSIS — I10 Essential (primary) hypertension: Secondary | ICD-10-CM | POA: Diagnosis not present

## 2019-09-11 DIAGNOSIS — R7302 Impaired glucose tolerance (oral): Secondary | ICD-10-CM

## 2019-09-11 DIAGNOSIS — E785 Hyperlipidemia, unspecified: Secondary | ICD-10-CM

## 2019-09-11 DIAGNOSIS — E611 Iron deficiency: Secondary | ICD-10-CM

## 2019-09-11 LAB — CBC WITH DIFFERENTIAL/PLATELET
Basophils Absolute: 0.1 10*3/uL (ref 0.0–0.1)
Basophils Relative: 0.9 % (ref 0.0–3.0)
Eosinophils Absolute: 0.1 10*3/uL (ref 0.0–0.7)
Eosinophils Relative: 1.6 % (ref 0.0–5.0)
HCT: 35.8 % — ABNORMAL LOW (ref 36.0–46.0)
Hemoglobin: 11.8 g/dL — ABNORMAL LOW (ref 12.0–15.0)
Lymphocytes Relative: 31 % (ref 12.0–46.0)
Lymphs Abs: 1.8 10*3/uL (ref 0.7–4.0)
MCHC: 33 g/dL (ref 30.0–36.0)
MCV: 89.1 fl (ref 78.0–100.0)
Monocytes Absolute: 0.6 10*3/uL (ref 0.1–1.0)
Monocytes Relative: 9.7 % (ref 3.0–12.0)
Neutro Abs: 3.4 10*3/uL (ref 1.4–7.7)
Neutrophils Relative %: 56.8 % (ref 43.0–77.0)
Platelets: 179 10*3/uL (ref 150.0–400.0)
RBC: 4.02 Mil/uL (ref 3.87–5.11)
RDW: 14.9 % (ref 11.5–15.5)
WBC: 5.9 10*3/uL (ref 4.0–10.5)

## 2019-09-11 LAB — URINALYSIS, ROUTINE W REFLEX MICROSCOPIC
Bilirubin Urine: NEGATIVE
Ketones, ur: NEGATIVE
Leukocytes,Ua: NEGATIVE
Nitrite: NEGATIVE
Specific Gravity, Urine: >=1.03 — AB (ref 1.000–1.030)
Total Protein, Urine: 100 — AB
Urine Glucose: NEGATIVE
Urobilinogen, UA: 0.2 (ref 0.0–1.0)
pH: 6 (ref 5.0–8.0)

## 2019-09-11 LAB — BASIC METABOLIC PANEL
BUN: 22 mg/dL (ref 6–23)
CO2: 30 mEq/L (ref 19–32)
Calcium: 9.8 mg/dL (ref 8.4–10.5)
Chloride: 103 mEq/L (ref 96–112)
Creatinine, Ser: 1.12 mg/dL (ref 0.40–1.20)
GFR: 46.6 mL/min — ABNORMAL LOW (ref >60.00)
Glucose, Bld: 65 mg/dL — ABNORMAL LOW (ref 70–99)
Potassium: 4.6 mEq/L (ref 3.5–5.1)
Sodium: 140 mEq/L (ref 135–145)

## 2019-09-11 LAB — LIPID PANEL
Cholesterol: 194 mg/dL (ref 0–200)
HDL: 75.3 mg/dL (ref >39.00)
LDL Cholesterol: 100 mg/dL — ABNORMAL HIGH (ref 0–99)
NonHDL: 118.68
Total CHOL/HDL Ratio: 3
Triglycerides: 95 mg/dL (ref 0.0–149.0)
VLDL: 19 mg/dL (ref 0.0–40.0)

## 2019-09-11 LAB — IBC PANEL
Iron: 137 ug/dL (ref 42–145)
Saturation Ratios: 34.6 % (ref 20.0–50.0)
Transferrin: 283 mg/dL (ref 212.0–360.0)

## 2019-09-11 LAB — HEPATIC FUNCTION PANEL
ALT: 15 U/L (ref 0–35)
AST: 25 U/L (ref 0–37)
Albumin: 4.4 g/dL (ref 3.5–5.2)
Alkaline Phosphatase: 87 U/L (ref 39–117)
Bilirubin, Direct: 0.1 mg/dL (ref 0.0–0.3)
Total Bilirubin: 0.9 mg/dL (ref 0.2–1.2)
Total Protein: 6.8 g/dL (ref 6.0–8.3)

## 2019-09-11 LAB — TSH: TSH: 13.41 u[IU]/mL — ABNORMAL HIGH (ref 0.35–4.50)

## 2019-09-11 LAB — T4, FREE: Free T4: 0.67 ng/dL (ref 0.60–1.60)

## 2019-09-11 LAB — VITAMIN D 25 HYDROXY (VIT D DEFICIENCY, FRACTURES): VITD: 40.09 ng/mL (ref 30.00–100.00)

## 2019-09-11 LAB — VITAMIN B12: Vitamin B-12: 253 pg/mL (ref 211–911)

## 2019-09-11 LAB — HEMOGLOBIN A1C: Hgb A1c MFr Bld: 6.1 % (ref 4.6–6.5)

## 2019-09-11 MED ORDER — LEVOTHYROXINE SODIUM 125 MCG PO TABS: 125.0000 ug | ORAL_TABLET | Freq: Every day | ORAL | 3 refills | Status: DC

## 2019-09-11 MED ORDER — CEPHALEXIN 500 MG PO CAPS: 500.0000 mg | ORAL_CAPSULE | Freq: Three times a day (TID) | ORAL | 0 refills | Status: AC

## 2019-09-11 NOTE — Patient Instructions (Addendum)
You had the flu shot today, and the Pneumovax pneumonia shot as well  Please continue all other medications as before, and refills have been done if requested.  Please have the pharmacy call with any other refills you may need.  Please continue your efforts at being more active, low cholesterol diet, and weight control.  You are otherwise up to date with prevention measures today.  Please keep your appointments with your specialists as you may have planned  Please go to the LAB in the Basement (turn left off the elevator) for the tests to be done today  You will be contacted by phone if any changes need to be made immediately.  Otherwise, you will receive a letter about your results with an explanation, but please check with MyChart first.  Please remember to sign up for MyChart if you have not done so, as this will be important to you in the future with finding out test results, communicating by private email, and scheduling acute appointments online when needed.  Please return in 6 months, or sooner if needed

## 2019-09-11 NOTE — Assessment & Plan Note (Signed)
stable overall by history and exam, recent data reviewed with pt, and pt to continue medical treatment as before,  to f/u any worsening symptoms or concerns  

## 2019-09-11 NOTE — Progress Notes (Signed)
Subjective:    Patient ID: Danielle [name], female    DOB: [birth year], 83 y.o.   MRN: [id number]  HPI  Here to f/u; overall doing ok,  Pt denies chest pain, increasing sob or doe, wheezing, orthopnea, PND, increased LE swelling, palpitations, dizziness or syncope.  Pt denies new neurological symptoms such as new headache, or facial or extremity weakness or numbness.  Pt denies polydipsia, polyuria, or low sugar episode.  Pt states overall good compliance with meds, mostly trying to follow appropriate diet,  but little exercise however. S/p right hip surgury.  Recent COVID testing is neg and URi symptoms improved.  [place], has 2 horses and keeping very busy and enjoying her life. Denies hyper or hypo thyroid symptoms such as voice, skin or hair change.  Due for flu shot.   Wt Readings from Last 3 Encounters:  09/11/19 109 lb 4 oz (49.6 kg)  04/28/19 108 lb 11.2 oz (49.3 kg)  04/24/19 108 lb 11.2 oz (49.3 kg)   Past Medical History:  Diagnosis Date  . ALLERGIC RHINITIS 01/09/2008  . ANEMIA-NOS 01/09/2008  . ANXIETY 01/09/2008  . ASTHMATIC BRONCHITIS, ACUTE 10/20/2008  . AV BLOCK, COMPLETE 12/16/2009  . DEPRESSION 01/09/2008  . Dizziness and giddiness 02/25/2010  . DVT, HX OF 01/09/2008  . Genetic testing 06/04/2017   Danielle [name] underwent genetic counseling and testing for hereditary cancer syndromes on 05/24/2017. Her results were negative for mutations in all 46 genes analyzed by [facility]'s 46-gene Common Hereditary Cancers Panel. Genes analyzed include: APC, ATM, AXIN2, BARD1, BMPR1A, BRCA1, BRCA2, BRIP1, CDH1, CDKN2A, CHEK2, CTNNA1, DICER1, EPCAM, GREM1, HOXB13, KIT, MEN1, MLH1, MSH2, MSH3, MSH6, MUTYH, NBN  . GERD 01/09/2008  . GLUCOSE INTOLERANCE 01/09/2008  . History of kidney stones   . HYPERLIPIDEMIA 01/09/2008  . HYPERTENSION 01/09/2008  . HYPOTHYROIDISM 01/09/2008  . Impaired glucose tolerance 07/28/2011  . INSOMNIA-SLEEP DISORDER-UNSPEC 06/15/2009  . NEPHROLITHIASIS, HX OF  01/09/2008  . OSTEOARTHRITIS, CERVICAL SPINE 02/25/2010  . Presence of permanent cardiac pacemaker    medtronic   Past Surgical History:  Procedure Laterality Date  . BREAST BIOPSY Right 09/20/2016   malignant  . ERCP W/ SPHICTEROTOMY    . HEMORRHOID SURGERY    . MASTECTOMY Right 01/11/2017  . MASTECTOMY W/ SENTINEL NODE BIOPSY Right 01/11/2017   Procedure: RIGHT MASTECTOMY WITH SENTINEL LYMPH NODE BIOPSY;  Surgeon: [name] Klein, MD;  Location: [place];  Service: General;  Laterality: Right;  . PACEMAKER PLACEMENT     MedTronic EnPulse [id number]  . PERMANENT PACEMAKER GENERATOR CHANGE N/A 07/16/2012   Procedure: PERMANENT PACEMAKER GENERATOR CHANGE;  Surgeon: Deboraha Sprang, MD;  Location[facility] C[facility] LAB;  Service: Cardiovascular;  Laterality: N/A;  . POLYPECTOMY     Uterine  . TOTAL HIP ARTHROPLASTY Right 04/28/2019  . TOTAL HIP ARTHROPLASTY Right 04/28/2019   Procedure: RIGHT TOTAL HIP ARTHROPLASTY ANTERIOR APPROACH;  Surgeon: Leandrew Koyanagi, MD;  Location: [place];  Service: Orthopedics;  Laterality: Right;    reports that she quit smoking about 47 years ago. Her smoking use included cigarettes. She has a 5.00 pack-year smoking history. She has never used smokeless tobacco. She reports current alcohol use. She reports that she does not use drugs. family history includes Breast cancer in her maternal aunt; Breast cancer (age of onset: 31) in her mother; Lung cancer in her paternal grandfather; Pulmonary embolism in her father; Sudden death in her mother. Allergies  Allergen Reactions  . Codeine Nausea And Vomiting  .  Oxycodone Nausea And Vomiting   Current Outpatient Medications on File Prior to Visit  Medication Sig Dispense Refill  . aspirin EC 81 MG tablet Take 1 tablet (81 mg total) by mouth 2 (two) times daily. 84 tablet 0  . atenolol (TENORMIN) 50 MG tablet Take 1 tablet (50 mg total) by mouth daily. Appointment needed for future refills 90 tablet 0  . atorvastatin (LIPITOR) 10 MG tablet  TAKE 1 TABLET(10 MG) BY MOUTH DAILY 90 tablet 0  . azelastine (ASTELIN) 0.1 % nasal spray Place 1 spray into both nostrils 2 (two) times daily. Use in each nostril as directed (Patient taking differently: Place 2 sprays into both nostrils daily as needed for allergies. ) 30 mL 11  . calcium-vitamin D (OSCAL WITH D) 500-200 MG-UNIT tablet Take 1 tablet by mouth daily with breakfast.    . enalapril (VASOTEC) 10 MG tablet Take 1 tablet (10 mg total) by mouth daily. Appointment needed for future refills 90 tablet 0  . senna-docusate (SENOKOT S) 8.6-50 MG tablet Take 1-2 tablets by mouth at bedtime as needed. 30 tablet 1   No current facility-administered medications on file prior to visit.    Review of Systems  Constitutional: Negative for other unusual diaphoresis or sweats HENT: Negative for ear discharge or swelling Eyes: Negative for other worsening visual disturbances Respiratory: Negative for stridor or other swelling  Gastrointestinal: Negative for worsening distension or other blood Genitourinary: Negative for retention or other urinary change Musculoskeletal: Negative for other MSK pain or swelling Skin: Negative for color change or other new lesions Neurological: Negative for worsening tremors and other numbness  Psychiatric/Behavioral: Negative for worsening agitation or other fatigue All otherwise neg per pt     Objective:   Physical Exam BP 140/80 (BP Location: Left Arm, Patient Position: Sitting, Cuff Size: Normal)   Pulse 81   Temp 98.3 F (36.8 C) (Oral)   Ht _0  (1.651 m)   Wt 109 lb 4 oz (49.6 kg)   SpO2 97%   BMI 18.18 kg/m  VS noted,  Constitutional: Pt appears in NAD HENT: Head: NCAT.  Right Ear: External ear normal.  Left Ear: External ear normal.  Eyes: . Pupils are equal, round, and reactive to light. Conjunctivae and EOM are normal Nose: without d/c or deformity Neck: Neck supple. Gross normal ROM Cardiovascular: Normal rate and regular rhythm.    Pulmonary/Chest: Effort normal and breath sounds without rales or wheezing.  Abd:  Soft, NT, ND, + BS, no organomegaly Neurological: Pt is alert. At baseline orientation, motor grossly intact Skin: Skin is warm. No rashes, other new lesions, no LE edema Psychiatric: Pt behavior is normal without agitation  All otherwise neg per pt Lab Results  Component Value Date   WBC 5.9 09/11/2019   HGB 11.8 (L) 09/11/2019   HCT 35.8 (L) 09/11/2019   PLT 179.0 09/11/2019   GLUCOSE 65 (L) 09/11/2019   CHOL 194 09/11/2019   TRIG 95.0 09/11/2019   HDL 75.30 09/11/2019   LDLDIRECT 128.0 05/11/2016   LDLCALC 100 (H) 09/11/2019   ALT 15 09/11/2019   AST 25 09/11/2019   NA 140 09/11/2019   K 4.6 09/11/2019   CL 103 09/11/2019   CREATININE 1.12 09/11/2019   BUN 22 09/11/2019   CO2 30 09/11/2019   TSH 13.41 (H) 09/11/2019   INR 1.0 04/24/2019   HGBA1C 6.1 09/11/2019       Assessment & Plan:

## 2019-09-26 ENCOUNTER — Ambulatory Visit
Admission: RE | Admit: 2019-09-26 | Discharge: 2019-09-26 | Disposition: A | Discharge status: Home / Self Care | Payer: Medicare Other | Source: Ambulatory Visit | Attending: Oncology | Admitting: Oncology

## 2019-09-26 ENCOUNTER — Other Ambulatory Visit: Payer: Self-pay

## 2019-09-26 DIAGNOSIS — Z1231 Encounter for screening mammogram for malignant neoplasm of breast: Secondary | ICD-10-CM

## 2019-10-03 ENCOUNTER — Telehealth: Payer: Self-pay | Admitting: Internal Medicine

## 2019-10-03 NOTE — Telephone Encounter (Signed)
Copied from [place] [phone] [id number]. Topic: General - Other >> Oct 03, 2019 11:19 AM Danielle [name] wrote: Reason for CRM: Pt called to report that there has been a slight improvement since taking the antibiotic cephALEXin (KEFLEX) 500 MG capsule

## 2019-10-03 NOTE — Telephone Encounter (Signed)
Noted  

## 2019-10-07 NOTE — Telephone Encounter (Signed)
Patient seeking clinical advice stating symptoms have resume, Frequent urination,  slight blood spotting when urinating. Patient unsure if PCP will refill medication mentioned below. Patient requesting a referral to [facility] C. Karsten Ro, M.D., FACS urologist.patient would like a follow up call to discuss

## 2019-10-08 NOTE — Telephone Encounter (Signed)
Any chance for OV or virtual?

## 2019-10-08 NOTE — Telephone Encounter (Signed)
Appointment scheduled.

## 2019-10-09 ENCOUNTER — Ambulatory Visit (INDEPENDENT_AMBULATORY_CARE_PROVIDER_SITE_OTHER): Payer: Medicare Other | Admitting: Internal Medicine

## 2019-10-09 ENCOUNTER — Other Ambulatory Visit (INDEPENDENT_AMBULATORY_CARE_PROVIDER_SITE_OTHER): Payer: Medicare Other

## 2019-10-09 ENCOUNTER — Encounter: Payer: Self-pay | Admitting: Internal Medicine

## 2019-10-09 ENCOUNTER — Other Ambulatory Visit: Payer: Self-pay

## 2019-10-09 ENCOUNTER — Other Ambulatory Visit: Payer: Self-pay | Admitting: Internal Medicine

## 2019-10-09 VITALS — BP 130/74 | HR 89 | Temp 98.0°F | Ht 65.0 in | Wt 112.0 lb

## 2019-10-09 DIAGNOSIS — R32 Unspecified urinary incontinence: Secondary | ICD-10-CM

## 2019-10-09 DIAGNOSIS — I10 Essential (primary) hypertension: Secondary | ICD-10-CM

## 2019-10-09 DIAGNOSIS — R7302 Impaired glucose tolerance (oral): Secondary | ICD-10-CM | POA: Diagnosis not present

## 2019-10-09 DIAGNOSIS — E785 Hyperlipidemia, unspecified: Secondary | ICD-10-CM | POA: Diagnosis not present

## 2019-10-09 HISTORY — DX: Unspecified urinary incontinence: R32

## 2019-10-09 LAB — URINALYSIS, ROUTINE W REFLEX MICROSCOPIC
Bilirubin Urine: NEGATIVE
Ketones, ur: NEGATIVE
Nitrite: NEGATIVE
Specific Gravity, Urine: >=1.03 — AB (ref 1.000–1.030)
Total Protein, Urine: 100 — AB
Urine Glucose: NEGATIVE
Urobilinogen, UA: 0.2 (ref 0.0–1.0)
pH: 6 (ref 5.0–8.0)

## 2019-10-09 MED ORDER — CIPROFLOXACIN HCL 500 MG PO TABS: 500.0000 mg | ORAL_TABLET | Freq: Two times a day (BID) | ORAL | 0 refills | Status: AC

## 2019-10-09 NOTE — Assessment & Plan Note (Signed)
Likely recurrent UTI - for urine studies, consider antbx,  to f/u any worsening symptoms or concerns

## 2019-10-09 NOTE — Progress Notes (Signed)
Subjective:    Patient ID: Danielle [name], female    DOB: Apr 16, 1936, 83 y.o.   MRN: [id number]  HPI  Here to f/u; overall doing ok,  Pt denies chest pain, increasing sob or doe, wheezing, orthopnea, PND, increased LE swelling, palpitations, dizziness or syncope.  Pt denies new neurological symptoms such as new headache, or facial or extremity weakness or numbness.  Pt denies polydipsia, polyuria, or low sugar episode.  Pt states overall good compliance with meds, mostly trying to follow appropriate diet, with wt overall stable   Also Here with c/o incontinence mild, wearing pads, seemed to start around the time of last vist with abnormal UA, better while taking the antibx, but now returned symptoms.  O/w denies urinary symptoms such as dysuria, frequency, urgency, flank pain, hematuria or n/v, fever, chills.  Has seen urology several times in the past with kidney stones, the last with Dr Karsten Ro who removed renal stones.  Has done well in recent yrs.  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 Past Medical History:  Diagnosis Date  . ALLERGIC RHINITIS 01/09/2008  . ANEMIA-NOS 01/09/2008  . ANXIETY 01/09/2008  . ASTHMATIC BRONCHITIS, ACUTE 10/20/2008  . AV BLOCK, COMPLETE 12/16/2009  . DEPRESSION 01/09/2008  . Dizziness and giddiness 02/25/2010  . DVT, HX OF 01/09/2008  . Genetic testing 06/04/2017   [name] underwent genetic counseling and testing for hereditary cancer syndromes on 05/24/2017. Her results were negative for mutations in all 46 genes analyzed by [facility]'s 46-gene Common Hereditary Cancers Panel. Genes analyzed include: APC, ATM, AXIN2, BARD1, BMPR1A, BRCA1, BRCA2, BRIP1, CDH1, CDKN2A, CHEK2, CTNNA1, DICER1, EPCAM, GREM1, HOXB13, KIT, MEN1, MLH1, MSH2, MSH3, MSH6, MUTYH, NBN  . GERD 01/09/2008  . GLUCOSE INTOLERANCE 01/09/2008  . History of kidney stones   . HYPERLIPIDEMIA 01/09/2008  .  HYPERTENSION 01/09/2008  . HYPOTHYROIDISM 01/09/2008  . Impaired glucose tolerance 07/28/2011  . INSOMNIA-SLEEP DISORDER-UNSPEC 06/15/2009  . NEPHROLITHIASIS, HX OF 01/09/2008  . OSTEOARTHRITIS, CERVICAL SPINE 02/25/2010  . Presence of permanent cardiac pacemaker    medtronic   Past Surgical History:  Procedure Laterality Date  . BREAST BIOPSY Right 09/20/2016   malignant  . ERCP W/ SPHICTEROTOMY    . HEMORRHOID SURGERY    . MASTECTOMY Right 01/11/2017  . MASTECTOMY W/ SENTINEL NODE BIOPSY Right 01/11/2017   Procedure: RIGHT MASTECTOMY WITH SENTINEL LYMPH NODE BIOPSY;  Surgeon: [name] Klein, MD;  Location: [place];  Service: General;  Laterality: Right;  . PACEMAKER PLACEMENT     MedTronic EnPulse [id number]  . PERMANENT PACEMAKER GENERATOR CHANGE N/A 07/16/2012   Procedure: PERMANENT PACEMAKER GENERATOR CHANGE;  Surgeon: Deboraha Sprang, MD;  Location: [facility] CATH LAB;  Service: Cardiovascular;  Laterality: N/A;  . POLYPECTOMY     Uterine  . TOTAL HIP ARTHROPLASTY Right 04/28/2019  . TOTAL HIP ARTHROPLASTY Right 04/28/2019   Procedure: RIGHT TOTAL HIP ARTHROPLASTY ANTERIOR APPROACH;  Surgeon: Leandrew Koyanagi, MD;  Location: [place];  Service: Orthopedics;  Laterality: Right;    reports that she quit smoking about 47 years ago. Her smoking use included cigarettes. She has a 5.00 pack-year smoking history. She has never used smokeless tobacco. She reports current alcohol use. She reports that she does not use drugs. family history includes Breast cancer in her maternal aunt; Breast cancer (age of onset: 54) in her mother; Lung cancer  in her paternal grandfather; Pulmonary embolism in her father; Sudden death in her mother. Allergies  Allergen Reactions  . Codeine Nausea And Vomiting  . Oxycodone Nausea And Vomiting   Current Outpatient Medications on File Prior to Visit  Medication Sig Dispense Refill  . aspirin EC 81 MG tablet Take 1 tablet (81 mg total) by mouth 2 (two) times daily. 84 tablet 0  .  atenolol (TENORMIN) 50 MG tablet Take 1 tablet (50 mg total) by mouth daily. Appointment needed for future refills 90 tablet 0  . atorvastatin (LIPITOR) 10 MG tablet TAKE 1 TABLET(10 MG) BY MOUTH DAILY 90 tablet 0  . azelastine (ASTELIN) 0.1 % nasal spray Place 1 spray into both nostrils 2 (two) times daily. Use in each nostril as directed (Patient taking differently: Place 2 sprays into both nostrils daily as needed for allergies. ) 30 mL 11  . calcium-vitamin D (OSCAL WITH D) 500-200 MG-UNIT tablet Take 1 tablet by mouth daily with breakfast.    . enalapril (VASOTEC) 10 MG tablet Take 1 tablet (10 mg total) by mouth daily. Appointment needed for future refills 90 tablet 0  . levothyroxine (SYNTHROID) 125 MCG tablet Take 1 tablet (125 mcg total) by mouth daily. 90 tablet 3  . senna-docusate (SENOKOT S) 8.6-50 MG tablet Take 1-2 tablets by mouth at bedtime as needed. 30 tablet 1   No current facility-administered medications on file prior to visit.    Review of Systems  Constitutional: Negative for other unusual diaphoresis or sweats HENT: Negative for ear discharge or swelling Eyes: Negative for other worsening visual disturbances Respiratory: Negative for stridor or other swelling  Gastrointestinal: Negative for worsening distension or other blood Genitourinary: Negative for retention or other urinary change Musculoskeletal: Negative for other MSK pain or swelling Skin: Negative for color change or other new lesions Neurological: Negative for worsening tremors and other numbness  Psychiatric/Behavioral: Negative for worsening agitation or other fatigue All otherwise neg per pt     Objective:   Physical Exam BP 130/74   Pulse 89   Temp 98 F (36.7 C) (Oral)   Ht 5' 5" (1.651 m)   Wt 112 lb (50.8 kg)   SpO2 98%   BMI 18.64 kg/m  VS noted,  Constitutional: Pt appears in NAD HENT: Head: NCAT.  Right Ear: External ear normal.  Left Ear: External ear normal.  Eyes: . Pupils are  equal, round, and reactive to light. Conjunctivae and EOM are normal Nose: without d/c or deformity Neck: Neck supple. Gross normal ROM Cardiovascular: Normal rate and regular rhythm.   Pulmonary/Chest: Effort normal and breath sounds without rales or wheezing.  Abd:  Soft, NT, ND, + BS, no organomegaly Neurological: Pt is alert. At baseline orientation, motor grossly intact Skin: Skin is warm. No rashes, other new lesions, no LE edema Psychiatric: Pt behavior is normal without agitation  All otherwise neg per pt  Lab Results  Component Value Date   WBC 5.9 09/11/2019   HGB 11.8 (L) 09/11/2019   HCT 35.8 (L) 09/11/2019   PLT 179.0 09/11/2019   GLUCOSE 65 (L) 09/11/2019   CHOL 194 09/11/2019   TRIG 95.0 09/11/2019   HDL 75.30 09/11/2019   LDLDIRECT 128.0 05/11/2016   LDLCALC 100 (H) 09/11/2019   ALT 15 09/11/2019   AST 25 09/11/2019   NA 140 09/11/2019   K 4.6 09/11/2019   CL 103 09/11/2019   CREATININE 1.12 09/11/2019   BUN 22 09/11/2019   CO2 30 09/11/2019  TSH 13.41 (H) 09/11/2019   INR 1.0 04/24/2019   HGBA1C 6.1 09/11/2019       Assessment & Plan:

## 2019-10-09 NOTE — Assessment & Plan Note (Signed)
stable overall by history and exam, recent data reviewed with pt, and pt to continue medical treatment as before,  to f/u any worsening symptoms or concerns  

## 2019-10-09 NOTE — Patient Instructions (Addendum)
Please continue all other medications as before, and refills have been done if requested.  Please have the pharmacy call with any other refills you may need.  Please continue your efforts at being more active, low cholesterol diet, and weight control.  Please keep your appointments with your specialists as you may have planned  Please go to the LAB in the Basement (turn left off the elevator) for the tests to be done today - just the urine testing today  You will be contacted by phone if any changes need to be made immediately.  Otherwise, you will receive a letter about your results with an explanation, but please check with MyChart first.  Please remember to sign up for MyChart if you have not done so, as this will be important to you in the future with finding out test results, communicating by private email, and scheduling acute appointments online when needed.

## 2019-10-10 LAB — URINE CULTURE
MICRO NUMBER:: 1119286
SPECIMEN QUALITY:: ADEQUATE

## 2019-10-23 ENCOUNTER — Other Ambulatory Visit: Payer: Self-pay

## 2019-10-23 ENCOUNTER — Ambulatory Visit (INDEPENDENT_AMBULATORY_CARE_PROVIDER_SITE_OTHER): Payer: Medicare Other

## 2019-10-23 DIAGNOSIS — Z23 Encounter for immunization: Secondary | ICD-10-CM | POA: Diagnosis not present

## 2019-10-23 DIAGNOSIS — Z299 Encounter for prophylactic measures, unspecified: Secondary | ICD-10-CM

## 2019-10-28 ENCOUNTER — Ambulatory Visit: Payer: Medicare Other | Admitting: Orthopaedic Surgery

## 2019-11-04 ENCOUNTER — Other Ambulatory Visit: Payer: Self-pay

## 2019-11-04 ENCOUNTER — Encounter: Payer: Self-pay | Admitting: Orthopaedic Surgery

## 2019-11-04 ENCOUNTER — Ambulatory Visit (INDEPENDENT_AMBULATORY_CARE_PROVIDER_SITE_OTHER): Payer: Medicare Other

## 2019-11-04 ENCOUNTER — Ambulatory Visit (INDEPENDENT_AMBULATORY_CARE_PROVIDER_SITE_OTHER): Payer: Medicare Other | Admitting: Orthopaedic Surgery

## 2019-11-04 DIAGNOSIS — Z96641 Presence of right artificial hip joint: Secondary | ICD-10-CM | POA: Diagnosis not present

## 2019-11-04 DIAGNOSIS — M542 Cervicalgia: Secondary | ICD-10-CM | POA: Insufficient documentation

## 2019-11-04 HISTORY — DX: Cervicalgia: M54.2

## 2019-11-04 HISTORY — DX: Presence of right artificial hip joint: Z96.641

## 2019-11-04 NOTE — Progress Notes (Signed)
Office Visit Note   Patient: Danielle [name]           Date of Birth: Mar 06, 1936           MRN: [id number] Visit Date: 11/04/2019              Requested by: Biagio Borg, MD [address] [place],  [place] [address] PCP: Biagio Borg, MD   Assessment & Plan: Visit Diagnoses:  1. Status post total hip replacement, right   2. Cervicalgia     Plan: Impression is 6 months status post right total hip placement and neck pain.  From the hip replacement standpoint dental prophylaxis was reinforced.  Patient has resumed all activities at this point.  Recheck in 6 months with standing AP pelvis and lateral hip.  We will make a referral for physical therapy for her neck pain.  Follow-Up Instructions: Return in about 6 months (around 05/04/2020).   Orders:  Orders Placed This Encounter  Procedures  . XR Pelvis 1-2 Views  . Ambulatory referral to Physical Therapy   No orders of the defined types were placed in this encounter.     Procedures: No procedures performed   Clinical Data: No additional findings.   Subjective: Chief Complaint  Patient presents with  . Right Hip - Pain    Danielle [name] is 6 months status post right total replacement.  She is doing well.  No complaints.  She is very happy with her outcome.  She is also complaining of some back pain.  She would like to do some physical therapy.  She had previously seen a neurosurgeon about 5 years ago who recommended surgery but she declined at this time.   Review of Systems   Objective: Vital Signs: There were no vitals taken for this visit.  Physical Exam  Ortho Exam Right hip exam shows a fully healed surgical scar.  Leg lengths are equal.  She is ambulating well without a limp.  No assistive devices either. Specialty Comments:  No specialty comments available.  Imaging: XR Pelvis 1-2 Views  Result Date: 11/04/2019 Stable right total hip replacement.      PMFS History: Patient Active Problem  List   Diagnosis Date Noted  . Cervicalgia 11/04/2019  . Status post total hip replacement, right 11/04/2019  . Urinary incontinence 10/09/2019  . History of total hip replacement 04/28/2019  . Degenerative joint disease of right hip 12/17/2018  . Genetic testing 06/04/2017  . Malignant neoplasm of overlapping sites of right breast in female, estrogen receptor negative ([place]) 04/24/2017  . Chronic venous insufficiency 03/15/2017  . Subungual hematoma of foot 03/15/2017  . Dyspepsia 03/07/2017  . Nausea & vomiting 02/22/2017  . Cough 02/22/2017  . Hematuria 02/22/2017  . S/P right mastectomy 01/11/2017  . Breast cancer of upper-inner quadrant of right female breast ([place]) 10/06/2016  . Right hip pain 07/18/2016  . Olecranon bursitis of left elbow 05/05/2015  . Alopecia 01/21/2015  . Left leg swelling 01/21/2015  . Cervical radiculopathy 07/11/2013  . Preventative health care 08/03/2012  . Dizziness 08/03/2012  . Rash 03/26/2012  . Pacemaker-medtronic 01/23/2012  . Impaired glucose tolerance 07/28/2011  . OSTEOARTHRITIS, CERVICAL SPINE 02/25/2010  . Dizziness and giddiness 02/25/2010  . AV BLOCK, COMPLETE 12/16/2009  . INSOMNIA-SLEEP DISORDER-UNSPEC 06/15/2009  . Hypothyroidism 01/09/2008  . Hyperlipidemia 01/09/2008  . ANEMIA-NOS 01/09/2008  . ANXIETY 01/09/2008  . Depression 01/09/2008  . Essential hypertension 01/09/2008  . ALLERGIC RHINITIS 01/09/2008  .  GERD 01/09/2008  . DVT, HX OF 01/09/2008  . NEPHROLITHIASIS, HX OF 01/09/2008   Past Medical History:  Diagnosis Date  . ALLERGIC RHINITIS 01/09/2008  . ANEMIA-NOS 01/09/2008  . ANXIETY 01/09/2008  . ASTHMATIC BRONCHITIS, ACUTE 10/20/2008  . AV BLOCK, COMPLETE 12/16/2009  . DEPRESSION 01/09/2008  . Dizziness and giddiness 02/25/2010  . DVT, HX OF 01/09/2008  . Genetic testing 06/04/2017   Ms. Labus underwent genetic counseling and testing for hereditary cancer syndromes on 05/24/2017. Her results were negative for  mutations in all 46 genes analyzed by [facility]'s 46-gene Common Hereditary Cancers Panel. Genes analyzed include: APC, ATM, AXIN2, BARD1, BMPR1A, BRCA1, BRCA2, BRIP1, CDH1, CDKN2A, CHEK2, CTNNA1, DICER1, EPCAM, GREM1, HOXB13, KIT, MEN1, MLH1, MSH2, MSH3, MSH6, MUTYH, NBN  . GERD 01/09/2008  . GLUCOSE INTOLERANCE 01/09/2008  . History of kidney stones   . HYPERLIPIDEMIA 01/09/2008  . HYPERTENSION 01/09/2008  . HYPOTHYROIDISM 01/09/2008  . Impaired glucose tolerance 07/28/2011  . INSOMNIA-SLEEP DISORDER-UNSPEC 06/15/2009  . NEPHROLITHIASIS, HX OF 01/09/2008  . OSTEOARTHRITIS, CERVICAL SPINE 02/25/2010  . Presence of permanent cardiac pacemaker    medtronic    Family History  Problem Relation Age of Onset  . Sudden death Mother        d.39s  . Breast cancer Mother 59  . Pulmonary embolism Father        Possible  . Lung cancer Paternal Grandfather        history of smoking  . Breast cancer Maternal Aunt        d.78s    Past Surgical History:  Procedure Laterality Date  . BREAST BIOPSY Right 09/20/2016   malignant  . ERCP W/ SPHICTEROTOMY    . HEMORRHOID SURGERY    . MASTECTOMY Right 01/11/2017  . MASTECTOMY W/ SENTINEL NODE BIOPSY Right 01/11/2017   Procedure: RIGHT MASTECTOMY WITH SENTINEL LYMPH NODE BIOPSY;  Surgeon: [name] Klein, MD;  Location: [place];  Service: General;  Laterality: Right;  . PACEMAKER PLACEMENT     MedTronic EnPulse [id number]  . PERMANENT PACEMAKER GENERATOR CHANGE N/A 07/16/2012   Procedure: PERMANENT PACEMAKER GENERATOR CHANGE;  Surgeon: Deboraha Sprang, MD;  Location: [facility] CATH LAB;  Service: Cardiovascular;  Laterality: N/A;  . POLYPECTOMY     Uterine  . TOTAL HIP ARTHROPLASTY Right 04/28/2019  . TOTAL HIP ARTHROPLASTY Right 04/28/2019   Procedure: RIGHT TOTAL HIP ARTHROPLASTY ANTERIOR APPROACH;  Surgeon: Leandrew Koyanagi, MD;  Location: [place];  Service: Orthopedics;  Laterality: Right;   Social History   Occupational History  . Not on file  Tobacco Use  . Smoking  status: Former Smoker    Packs/day: 0.20    Years: 25.00    Pack years: 5.00    Types: Cigarettes    Quit date: 11/21/1971    Years since quitting: 47.9  . Smokeless tobacco: Never Used  Substance and Sexual Activity  . Alcohol use: Yes    Comment: wine  . Drug use: No  . Sexual activity: Not on file

## 2019-11-10 ENCOUNTER — Other Ambulatory Visit: Payer: Self-pay | Admitting: *Deleted

## 2019-11-10 MED ORDER — ATENOLOL 50 MG PO TABS: 50.0000 mg | ORAL_TABLET | Freq: Every day | ORAL | 0 refills | Status: DC

## 2019-11-10 MED ORDER — ENALAPRIL MALEATE 10 MG PO TABS: 10.0000 mg | ORAL_TABLET | Freq: Every day | ORAL | 0 refills | Status: DC

## 2019-11-10 MED ORDER — ATORVASTATIN CALCIUM 10 MG PO TABS: ORAL_TABLET | ORAL | 0 refills | Status: DC

## 2019-11-24 ENCOUNTER — Ambulatory Visit: Payer: Medicare Other | Admitting: Physical Therapy

## 2019-11-27 DIAGNOSIS — R3121 Asymptomatic microscopic hematuria: Secondary | ICD-10-CM | POA: Diagnosis not present

## 2019-12-19 ENCOUNTER — Telehealth: Payer: Self-pay | Admitting: Internal Medicine

## 2019-12-19 NOTE — Telephone Encounter (Signed)
I asked the pt to send a transmission with her home monitor. She states she will not be able to do it until tomorrow. I explained to her that we are closed on Saturday's and the nurse will not be able to review it until Monday morning. She is not to concern about the A-fib episode. She states she do not feel dizzy or anything but she just felt it then it went away. I told her if the monitor do not work on Saturday she can bring the monitor with her to her appointment and we will try to trouble shoot the monitor or order her a new monitor. The pt verbalized understanding and thanked me for the help.

## 2019-12-19 NOTE — Telephone Encounter (Signed)
New message   Pt called to schedule an appt with SK. Pt said she has been having fibrillations with her device.

## 2019-12-22 NOTE — Telephone Encounter (Signed)
Attempted to reach pt;  A transmission has not been received.  No answer, left genric VM requesting pt callback.

## 2019-12-23 NOTE — Telephone Encounter (Signed)
Pt states she has tried to send a transmission but the monitor is not working. I told her to bring the monitor with her and we will try to trouble shoot the monitor while she is here. If the monitor do not work then we will make sure to order her a new monitor. The pt verbalized understanding and thanked me for my help.

## 2019-12-24 ENCOUNTER — Ambulatory Visit (INDEPENDENT_AMBULATORY_CARE_PROVIDER_SITE_OTHER): Payer: Medicare HMO | Admitting: Student

## 2019-12-24 ENCOUNTER — Encounter: Payer: Self-pay | Admitting: Student

## 2019-12-24 ENCOUNTER — Other Ambulatory Visit: Payer: Self-pay

## 2019-12-24 VITALS — BP 134/80 | HR 81 | Ht 63.0 in | Wt 116.4 lb

## 2019-12-24 DIAGNOSIS — I10 Essential (primary) hypertension: Secondary | ICD-10-CM | POA: Diagnosis not present

## 2019-12-24 DIAGNOSIS — I442 Atrioventricular block, complete: Secondary | ICD-10-CM

## 2019-12-24 DIAGNOSIS — E039 Hypothyroidism, unspecified: Secondary | ICD-10-CM | POA: Diagnosis not present

## 2019-12-24 NOTE — Progress Notes (Signed)
Electrophysiology Office Note Date: 12/24/2019  ID:  Danielle, [name] [birth year], MRN [id number]  PCP: Biagio Borg, MD Primary Cardiologist: Virl Axe, MD Electrophysiologist: Dr. Caryl Comes   CC: Pacemaker follow-up  Danielle [name] is a 84 y.o. female seen today for Dr. Caryl Comes . she presents today for routine electrophysiology followup.  Since last being seen in our clinic, the patient reports doing very well. She has had mild "flutterings" in her chest, which it had been years previous. Not limiting. She attempted to send a transmission but couldn't get it to work. She denies chest pain, dyspnea, PND, orthopnea, nausea, vomiting, dizziness, syncope, edema, weight gain, or early satiety.  Device History: Medtronic Dual Chamber PPM implanted 1997, gen change 01/2005 gen change 06/2012 for CHB  Past Medical History:  Diagnosis Date  . ALLERGIC RHINITIS 01/09/2008  . ANEMIA-NOS 01/09/2008  . ANXIETY 01/09/2008  . ASTHMATIC BRONCHITIS, ACUTE 10/20/2008  . AV BLOCK, COMPLETE 12/16/2009  . DEPRESSION 01/09/2008  . Dizziness and giddiness 02/25/2010  . DVT, HX OF 01/09/2008  . Genetic testing 06/04/2017   Ms. Fuquay underwent genetic counseling and testing for hereditary cancer syndromes on 05/24/2017. Her results were negative for mutations in all 46 genes analyzed by [facility]'s 46-gene Common Hereditary Cancers Panel. Genes analyzed include: APC, ATM, AXIN2, BARD1, BMPR1A, BRCA1, BRCA2, BRIP1, CDH1, CDKN2A, CHEK2, CTNNA1, DICER1, EPCAM, GREM1, HOXB13, KIT, MEN1, MLH1, MSH2, MSH3, MSH6, MUTYH, NBN  . GERD 01/09/2008  . GLUCOSE INTOLERANCE 01/09/2008  . History of kidney stones   . HYPERLIPIDEMIA 01/09/2008  . HYPERTENSION 01/09/2008  . HYPOTHYROIDISM 01/09/2008  . Impaired glucose tolerance 07/28/2011  . INSOMNIA-SLEEP DISORDER-UNSPEC 06/15/2009  . NEPHROLITHIASIS, HX OF 01/09/2008  . OSTEOARTHRITIS, CERVICAL SPINE 02/25/2010  . Presence of permanent cardiac pacemaker    medtronic   Past  Surgical History:  Procedure Laterality Date  . BREAST BIOPSY Right 09/20/2016   malignant  . ERCP W/ SPHICTEROTOMY    . HEMORRHOID SURGERY    . MASTECTOMY Right 01/11/2017  . MASTECTOMY W/ SENTINEL NODE BIOPSY Right 01/11/2017   Procedure: RIGHT MASTECTOMY WITH SENTINEL LYMPH NODE BIOPSY;  Surgeon: [name] Klein, MD;  Location: [facility];  Service: General;  Laterality: Right;  . PACEMAKER PLACEMENT     MedTronic EnPulse [id number]  . PERMANENT PACEMAKER GENERATOR CHANGE N/A 07/16/2012   Procedure: PERMANENT PACEMAKER GENERATOR CHANGE;  Surgeon: Deboraha Sprang, MD;  Location[facility] C[facility] LAB;  Service: Cardiovascular;  Laterality: N/A;  . POLYPECTOMY     Uterine  . TOTAL HIP ARTHROPLASTY Right 04/28/2019  . TOTAL HIP ARTHROPLASTY Right 04/28/2019   Procedure: RIGHT TOTAL HIP ARTHROPLASTY ANTERIOR APPROACH;  Surgeon: Leandrew Koyanagi, MD;  Location: [facility];  Service: Orthopedics;  Laterality: Right;    Current Outpatient Medications  Medication Sig Dispense Refill  . aspirin EC 81 MG tablet Take 81 mg by mouth daily.    Marland Kitchen atenolol (TENORMIN) 50 MG tablet Take 1 tablet (50 mg total) by mouth daily. 90 tablet 0  . atorvastatin (LIPITOR) 10 MG tablet TAKE 1 TABLET(10 MG) BY MOUTH DAILY 90 tablet 0  . azelastine (ASTELIN) 0.1 % nasal spray Place 1 spray into both nostrils 2 (two) times daily.    . calcium-vitamin D (OSCAL WITH D) 500-200 MG-UNIT tablet Take 1 tablet by mouth daily with breakfast.    . enalapril (VASOTEC) 10 MG tablet Take 1 tablet (10 mg total) by mouth daily. 90 tablet 0  . levothyroxine (SYNTHROID) 125 MCG tablet Take 1  tablet (125 mcg total) by mouth daily. 90 tablet 3   No current facility-administered medications for this visit.    Allergies:   Codeine and Oxycodone   Social History: Social History   Socioeconomic History  . Marital status: Married    Spouse name: Not on file  . Number of children: Not on file  . Years of education: Not on file  . Highest education level:  Not on file  Occupational History  . Not on file  Tobacco Use  . Smoking status: Former Smoker    Packs/day: 0.20    Years: 25.00    Pack years: 5.00    Types: Cigarettes    Quit date: 11/21/1971    Years since quitting: 48.1  . Smokeless tobacco: Never Used  Substance and Sexual Activity  . Alcohol use: Yes    Comment: wine  . Drug use: No  . Sexual activity: Not on file  Other Topics Concern  . Not on file  Social History Narrative  . Not on file   Social Determinants of Health   Financial Resource Strain:   . Difficulty of Paying Living Expenses: Not on file  Food Insecurity:   . Worried About Charity fundraiser in the Last Year: Not on file  . Ran Out of Food in the Last Year: Not on file  Transportation Needs:   . Lack of Transportation (Medical): Not on file  . Lack of Transportation (Non-Medical): Not on file  Physical Activity:   . Days of Exercise per Week: Not on file  . Minutes of Exercise per Session: Not on file  Stress:   . Feeling of Stress : Not on file  Social Connections:   . Frequency of Communication with Friends and Family: Not on file  . Frequency of Social Gatherings with Friends and Family: Not on file  . Attends Religious Services: Not on file  . Active Member of Clubs or Organizations: Not on file  . Attends Archivist Meetings: Not on file  . Marital Status: Not on file  Intimate Partner Violence:   . Fear of Current or Ex-Partner: Not on file  . Emotionally Abused: Not on file  . Physically Abused: Not on file  . Sexually Abused: Not on file    Family History: Family History  Problem Relation Age of Onset  . Sudden death Mother        d.81s  . Breast cancer Mother 4  . Pulmonary embolism Father        Possible  . Lung cancer Paternal Grandfather        history of smoking  . Breast cancer Maternal Aunt        d.78s     Review of Systems: All other systems reviewed and are otherwise negative except as noted above.   Physical Exam: Vitals:   12/24/19 0920  BP: 134/80  Pulse: 81  SpO2: 97%  Weight: 116 lb 6.4 oz (52.8 kg)  Height: '5\' 3"'$  (1.6 m)     GEN- The patient is well appearing, alert and oriented x 3 today.   HEENT: normocephalic, atraumatic; sclera clear, conjunctiva pink; hearing intact; oropharynx clear; neck supple  Lungs- Clear to ausculation bilaterally, normal work of breathing.  No wheezes, rales, rhonchi Heart- Regular rate and rhythm, no murmurs, rubs or gallops  GI- soft, non-tender, non-distended, bowel sounds present  Extremities- no clubbing, cyanosis, or edema  MS- no significant deformity or atrophy Skin- warm and dry, no  rash or lesion; PPM pocket well healed Psych- euthymic mood, full affect Neuro- strength and sensation are intact  PPM Interrogation- reviewed in detail today,  See PACEART report  EKG:  EKG is ordered today. The ekg ordered today shows AV dual paced at 65 bpm  Recent Labs: 09/11/2019: ALT 15; BUN 22; Creatinine, Ser 1.12; Hemoglobin 11.8; Platelets 179.0; Potassium 4.6; Sodium 140; TSH 13.41   Wt Readings from Last 3 Encounters:  12/24/19 116 lb 6.4 oz (52.8 kg)  10/09/19 112 lb (50.8 kg)  09/11/19 109 lb 4 oz (49.6 kg)     Other studies Reviewed: Additional studies/ records that were reviewed today include: Previous EP office notes, Previous remote checks, Most recent labwork.   Assessment and Plan:  1.  Symptomatic bradycardia due to CHB s/p Medtronic PPM  Normal PPM function See Pace Art report No changes today Monitor transmitted successfully in office. We will re-set her up with q 3 month remote monitoring.  She has chronically elevated thresholds.   2. Anemia Recent labwork stable. Per PCP  3. Hypothyroidism Stable. Per PCP.   4. HTN Stable. No change today.   Current medicines are reviewed at length with the patient today.   The patient does not have concerns regarding her medicines.  The following changes were made today:   none  Labs/ tests ordered today include:  Orders Placed This Encounter  Procedures  . EKG 12-Lead   Disposition:   Follow up with Dr. Caryl Comes in 12 months. Remotes q 3.   Jacalyn Lefevre, PA-C  12/24/2019 9:57 AM  [facility] [address] [place] [address] [phone] (office) [phone] (fax)

## 2019-12-24 NOTE — Patient Instructions (Addendum)
Medication Instructions:  none *If you need a refill on your cardiac medications before your next appointment, please call your pharmacy*  Lab Work: none If you have labs (blood work) drawn today and your tests are completely normal, you will receive your results only by: Marland Kitchen MyChart Message (if you have MyChart) OR . A paper copy in the mail If you have any lab test that is abnormal or we need to change your treatment, we will call you to review the results.  Testing/Procedures: none  Follow-Up: At [facility], you and your health needs are our priority.  As part of our continuing mission to provide you with exceptional heart care, we have created designated Provider Care Teams.  These Care Teams include your primary Cardiologist (physician) and Advanced Practice Providers (APPs -  Physician Assistants and Nurse Practitioners) who all work together to provide you with the care you need, when you need it.  Your next appointment:   1 year(s)  The format for your next appointment:   Either In Person or Virtual  Provider:   Dr Caryl Comes  Other Instructions  Remote monitoring is used to monitor your Pacemaker  from home. This monitoring reduces the number of office visits required to check your device to one time per year. It allows [name] to keep an eye on the functioning of your device to ensure it is working properly. You are scheduled for a device check from home on 03/24/20. You may send your transmission at any time that day. If you have a wireless device, the transmission will be sent automatically. After your physician reviews your transmission, you will receive a postcard with your next transmission date.

## 2019-12-25 ENCOUNTER — Ambulatory Visit (INDEPENDENT_AMBULATORY_CARE_PROVIDER_SITE_OTHER): Payer: Medicare HMO | Admitting: *Deleted

## 2019-12-25 DIAGNOSIS — I442 Atrioventricular block, complete: Secondary | ICD-10-CM

## 2019-12-25 LAB — CUP PACEART INCLINIC DEVICE CHECK
Battery Impedance: 1508 Ohm
Battery Remaining Longevity: 31 mo
Battery Voltage: 2.76 V
Brady Statistic AP VP Percent: 74 %
Brady Statistic AP VS Percent: 0 %
Brady Statistic AS VP Percent: 26 %
Brady Statistic AS VS Percent: 0 %
Date Time Interrogation Session: 20210203093600
Implantable Lead Implant Date: 19970116190000
Implantable Lead Implant Date: 19970116190000
Implantable Lead Location: 753859
Implantable Lead Location: 753860
Implantable Lead Model: 4068
Implantable Lead Model: 4068
Implantable Pulse Generator Implant Date: 20130826200000
Lead Channel Impedance Value: 731 Ohm
Lead Channel Impedance Value: 790 Ohm
Lead Channel Pacing Threshold Amplitude: 1.375 V
Lead Channel Pacing Threshold Amplitude: 1.75 V
Lead Channel Pacing Threshold Amplitude: 1.75 V
Lead Channel Pacing Threshold Amplitude: 2.5 V
Lead Channel Pacing Threshold Pulse Width: 0.4 ms
Lead Channel Pacing Threshold Pulse Width: 0.4 ms
Lead Channel Pacing Threshold Pulse Width: 0.4 ms
Lead Channel Pacing Threshold Pulse Width: 1.25 ms
Lead Channel Sensing Intrinsic Amplitude: 2 mV
Lead Channel Setting Pacing Amplitude: 2.5 V
Lead Channel Setting Pacing Amplitude: 3.25 V
Lead Channel Setting Pacing Pulse Width: 0.4 ms
Lead Channel Setting Sensing Sensitivity: 5.6 mV

## 2019-12-25 LAB — CUP PACEART REMOTE DEVICE CHECK
Battery Impedance: 1510 Ohm
Battery Remaining Longevity: 31 mo
Battery Voltage: 2.76 V
Brady Statistic AP VP Percent: 74 %
Brady Statistic AP VS Percent: 0 %
Brady Statistic AS VP Percent: 26 %
Brady Statistic AS VS Percent: 0 %
Date Time Interrogation Session: 20210203094410
Implantable Lead Implant Date: 19970116190000
Implantable Lead Implant Date: 19970116190000
Implantable Lead Location: 753859
Implantable Lead Location: 753860
Implantable Lead Model: 4068
Implantable Lead Model: 4068
Implantable Pulse Generator Implant Date: 20130826200000
Lead Channel Impedance Value: 731 Ohm
Lead Channel Impedance Value: 775 Ohm
Lead Channel Pacing Threshold Amplitude: 1.375 V
Lead Channel Pacing Threshold Amplitude: 2.5 V
Lead Channel Pacing Threshold Pulse Width: 0.4 ms
Lead Channel Pacing Threshold Pulse Width: 0.4 ms
Lead Channel Setting Pacing Amplitude: 2.5 V
Lead Channel Setting Pacing Amplitude: 3.25 V
Lead Channel Setting Pacing Pulse Width: 0.4 ms
Lead Channel Setting Sensing Sensitivity: 5.6 mV

## 2019-12-26 NOTE — Progress Notes (Signed)
PPM Remote  

## 2019-12-30 ENCOUNTER — Telehealth: Payer: Self-pay

## 2019-12-30 NOTE — Telephone Encounter (Signed)
lpmtcb 2/9

## 2019-12-31 ENCOUNTER — Encounter: Payer: Self-pay | Admitting: *Deleted

## 2020-01-01 ENCOUNTER — Telehealth: Payer: Self-pay | Admitting: Internal Medicine

## 2020-01-01 NOTE — Telephone Encounter (Signed)
I spoke to the patient and informed her that we have yet to retrieve her cards.  She verbalized understanding and said it was ok.Marland Kitchen

## 2020-01-01 NOTE — Telephone Encounter (Signed)
Follow up:     Patient returning your call back from 12/30/19. Please call patient back.

## 2020-01-08 ENCOUNTER — Encounter: Payer: Self-pay | Admitting: Oncology

## 2020-01-13 ENCOUNTER — Other Ambulatory Visit: Payer: Self-pay | Admitting: Urology

## 2020-01-13 ENCOUNTER — Other Ambulatory Visit (HOSPITAL_COMMUNITY): Payer: Self-pay | Admitting: Urology

## 2020-01-13 ENCOUNTER — Telehealth: Payer: Self-pay | Admitting: Internal Medicine

## 2020-01-13 DIAGNOSIS — N200 Calculus of kidney: Secondary | ICD-10-CM

## 2020-01-13 NOTE — Telephone Encounter (Signed)
Jonni Sanger and Dr. Caryl Comes  Danielle [name] was seen this month in clinic and was stable with her PPM she now needs rt percutaneous Nephrolithotomy 02/09/20   - Dr. Caryl Comes you cleared for hip surgery 04/2019 and she did well.   My question can we clear or should she be seen for cardiac eval?  Please send answer to pre-op pool

## 2020-01-13 NOTE — Telephone Encounter (Signed)
[facility] Pre-operative Risk Assessment    Request for surgical clearance:  1. What type of surgery is being performed? Right Percutaneous Nephrolithotomy   2. When is this surgery scheduled? 02/09/2020  3. What type of clearance is required (medical clearance vs. Pharmacy clearance to hold med vs. Both)? Medical   4. Are there any medications that need to be held prior to surgery and how long? No   5. Practice name and name of physician performing surgery? [facility] Dr Kathie Rhodes   6. What is your office phone number [phone] ext [phone]   7.   What is your office fax number [phone]  8.   Anesthesia type (None, local, MAC, general) ? General    Danielle [name] 01/13/2020, 4:09 PM  _________________________________________________________________   (provider comments below)

## 2020-01-14 ENCOUNTER — Ambulatory Visit (INDEPENDENT_AMBULATORY_CARE_PROVIDER_SITE_OTHER): Payer: Medicare Other

## 2020-01-14 ENCOUNTER — Other Ambulatory Visit (HOSPITAL_COMMUNITY): Payer: Self-pay | Admitting: Urology

## 2020-01-14 ENCOUNTER — Encounter: Payer: Self-pay | Admitting: Orthopaedic Surgery

## 2020-01-14 ENCOUNTER — Other Ambulatory Visit: Payer: Self-pay

## 2020-01-14 ENCOUNTER — Ambulatory Visit: Payer: Medicare HMO | Admitting: Orthopaedic Surgery

## 2020-01-14 VITALS — Ht 63.0 in | Wt 116.0 lb

## 2020-01-14 DIAGNOSIS — N200 Calculus of kidney: Secondary | ICD-10-CM

## 2020-01-14 DIAGNOSIS — G8929 Other chronic pain: Secondary | ICD-10-CM | POA: Diagnosis not present

## 2020-01-14 DIAGNOSIS — M25562 Pain in left knee: Secondary | ICD-10-CM

## 2020-01-14 MED ORDER — LIDOCAINE HCL 1 % IJ SOLN
2.0000 mL | INTRAMUSCULAR | Status: AC | PRN
  Administered 2020-01-14: 2 mL

## 2020-01-14 MED ORDER — METHYLPREDNISOLONE ACETATE 40 MG/ML IJ SUSP
40.0000 mg | INTRAMUSCULAR | Status: AC | PRN
  Administered 2020-01-14: 40 mg via INTRA_ARTICULAR

## 2020-01-14 MED ORDER — BUPIVACAINE HCL 0.5 % IJ SOLN
2.0000 mL | INTRAMUSCULAR | Status: AC | PRN
  Administered 2020-01-14: 2 mL via INTRA_ARTICULAR

## 2020-01-14 NOTE — Telephone Encounter (Signed)
   Primary Cardiologist: Virl Axe, MD  Chart reviewed as part of pre-operative protocol coverage. Given past medical history and time since last visit, based on ACC/AHA guidelines, Danielle [name] would be at acceptable risk for the planned procedure without further cardiovascular testing.   She is a moderate risk for cardiac complications with her age.  No Known CAD.  She does have a Medtronic pacemaker.    I will route this recommendation to the requesting party via Epic fax function and remove from pre-op pool.  Please call with questions.  Cecilie Kicks, NP 01/14/2020, 9:20 AM

## 2020-01-14 NOTE — Progress Notes (Signed)
Office Visit Note   Patient: Danielle [name]           Date of Birth: [birth year]           MRN: [id number] Visit Date: 01/14/2020              Requested by: Biagio Borg, MD [place],  [place] [address] PCP: Biagio Borg, MD   Assessment & Plan: Visit Diagnoses:  1. Chronic pain of left knee     Plan: Impression is knee pain possible degenerative medial meniscal tear.  Based on discussion patient elected to proceed with cortisone injection today.  I have recommended that she follow-up with [name] in about 4 weeks if she does not notice any improvement from the injection.  We would likely obtain an MRI at that time.  Follow-Up Instructions: Return if symptoms worsen or fail to improve.   Orders:  Orders Placed This Encounter  Procedures  . XR KNEE 3 VIEW LEFT   No orders of the defined types were placed in this encounter.     Procedures: Large Joint Inj: L knee on 01/14/2020 4:21 PM Details: 22 G needle Medications: 2 mL bupivacaine 0.5 %; 2 mL lidocaine 1 %; 40 mg methylPREDNISolone acetate 40 MG/ML Outcome: tolerated well, no immediate complications Patient was prepped and draped in the usual sterile fashion.       Clinical Data: No additional findings.   Subjective: Chief Complaint  Patient presents with  . Left Knee - Pain    Reve is a 84 year old female who is well-known to me who comes in for evaluation of left knee pain for the last 2 months.  The pain is mainly medial.  Sometimes it causes her weakness sensation.  She denies any popping catching but does have some giving way at times.  She denies any swelling.  Denies any injuries.  She has not taken any medications regularly for this.  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   Review of Systems  Constitutional: Negative.   HENT: Negative.   Eyes: Negative.   Respiratory: Negative.   Cardiovascular: Negative.   Endocrine: Negative.   Musculoskeletal: Negative.    Neurological: Negative.   Hematological: Negative.   Psychiatric/Behavioral: Negative.   All other systems reviewed and are negative.    Objective: Vital Signs: Ht _0  (1.6 m)   Wt 116 lb (52.6 kg)   BMI 20.55 kg/m   Physical Exam Vitals and nursing note reviewed.  Constitutional:      Appearance: She is well-developed.  Pulmonary:     Effort: Pulmonary effort is normal.  Skin:    General: Skin is warm.     Capillary Refill: Capillary refill takes less than 2 seconds.  Neurological:     Mental Status: She is alert and oriented to person, place, and time.  Psychiatric:        Behavior: Behavior normal.        Thought Content: Thought content normal.        Judgment: Judgment normal.     Ortho Exam Left knee exam shows no joint effusion.  There is medial joint line tenderness, collaterals and cruciates are stable.  Pain with McMurray test. Specialty Comments:  No specialty comments available.  Imaging: XR KNEE 3 VIEW LEFT  Result Date: 01/14/2020 Moderate osteoarthritis with mild joint space narrowing and periarticular spurring.    PMFS History: Patient Active Problem List  Diagnosis Date Noted  . Cervicalgia 11/04/2019  . Status post total hip replacement, right 11/04/2019  . Urinary incontinence 10/09/2019  . History of total hip replacement 04/28/2019  . Degenerative joint disease of right hip 12/17/2018  . Genetic testing 06/04/2017  . Malignant neoplasm of overlapping sites of right breast in female, estrogen receptor negative ([place]) 04/24/2017  . Chronic venous insufficiency 03/15/2017  . Subungual hematoma of foot 03/15/2017  . Dyspepsia 03/07/2017  . Nausea & vomiting 02/22/2017  . Cough 02/22/2017  . Hematuria 02/22/2017  . S/P right mastectomy 01/11/2017  . Breast cancer of upper-inner quadrant of right female breast ([place]) 10/06/2016  . Right hip pain 07/18/2016  . Olecranon bursitis of left elbow 05/05/2015  . Alopecia 01/21/2015  . Left  leg swelling 01/21/2015  . Cervical radiculopathy 07/11/2013  . Preventative health care 08/03/2012  . Dizziness 08/03/2012  . Rash 03/26/2012  . Pacemaker-medtronic 01/23/2012  . Impaired glucose tolerance 07/28/2011  . OSTEOARTHRITIS, CERVICAL SPINE 02/25/2010  . Dizziness and giddiness 02/25/2010  . AV BLOCK, COMPLETE 12/16/2009  . INSOMNIA-SLEEP DISORDER-UNSPEC 06/15/2009  . Hypothyroidism 01/09/2008  . Hyperlipidemia 01/09/2008  . ANEMIA-NOS 01/09/2008  . ANXIETY 01/09/2008  . Depression 01/09/2008  . Essential hypertension 01/09/2008  . ALLERGIC RHINITIS 01/09/2008  . GERD 01/09/2008  . DVT, HX OF 01/09/2008  . NEPHROLITHIASIS, HX OF 01/09/2008   Past Medical History:  Diagnosis Date  . ALLERGIC RHINITIS 01/09/2008  . ANEMIA-NOS 01/09/2008  . ANXIETY 01/09/2008  . ASTHMATIC BRONCHITIS, ACUTE 10/20/2008  . AV BLOCK, COMPLETE 12/16/2009  . DEPRESSION 01/09/2008  . Dizziness and giddiness 02/25/2010  . DVT, HX OF 01/09/2008  . Genetic testing 06/04/2017   [name] underwent genetic counseling and testing for hereditary cancer syndromes on 05/24/2017. Her results were negative for mutations in all 46 genes analyzed by [facility]'s 46-gene Common Hereditary Cancers Panel. Genes analyzed include: APC, ATM, AXIN2, BARD1, BMPR1A, BRCA1, BRCA2, BRIP1, CDH1, CDKN2A, CHEK2, CTNNA1, DICER1, EPCAM, GREM1, HOXB13, KIT, MEN1, MLH1, MSH2, MSH3, MSH6, MUTYH, NBN  . GERD 01/09/2008  . GLUCOSE INTOLERANCE 01/09/2008  . History of kidney stones   . HYPERLIPIDEMIA 01/09/2008  . HYPERTENSION 01/09/2008  . HYPOTHYROIDISM 01/09/2008  . Impaired glucose tolerance 07/28/2011  . INSOMNIA-SLEEP DISORDER-UNSPEC 06/15/2009  . NEPHROLITHIASIS, HX OF 01/09/2008  . OSTEOARTHRITIS, CERVICAL SPINE 02/25/2010  . Presence of permanent cardiac pacemaker    medtronic    Family History  Problem Relation Age of Onset  . Sudden death Mother        d.10s  . Breast cancer Mother 31  . Pulmonary embolism Father         Possible  . Lung cancer Paternal Grandfather        history of smoking  . Breast cancer Maternal Aunt        d.78s    Past Surgical History:  Procedure Laterality Date  . BREAST BIOPSY Right 09/20/2016   malignant  . ERCP W/ SPHICTEROTOMY    . HEMORRHOID SURGERY    . MASTECTOMY Right 01/11/2017  . MASTECTOMY W/ SENTINEL NODE BIOPSY Right 01/11/2017   Procedure: RIGHT MASTECTOMY WITH SENTINEL LYMPH NODE BIOPSY;  Surgeon: [name] Klein, MD;  Location: [place];  Service: General;  Laterality: Right;  . PACEMAKER PLACEMENT     MedTronic EnPulse [id number]  . PERMANENT PACEMAKER GENERATOR CHANGE N/A 07/16/2012   Procedure: PERMANENT PACEMAKER GENERATOR CHANGE;  Surgeon: Deboraha Sprang, MD;  Location: [facility] CATH LAB;  Service: Cardiovascular;  Laterality: N/A;  . POLYPECTOMY  Uterine  . TOTAL HIP ARTHROPLASTY Right 04/28/2019  . TOTAL HIP ARTHROPLASTY Right 04/28/2019   Procedure: RIGHT TOTAL HIP ARTHROPLASTY ANTERIOR APPROACH;  Surgeon: Leandrew Koyanagi, MD;  Location: [facility];  Service: Orthopedics;  Laterality: Right;   Social History   Occupational History  . Not on file  Tobacco Use  . Smoking status: Former Smoker    Packs/day: 0.20    Years: 25.00    Pack years: 5.00    Types: Cigarettes    Quit date: 11/21/1971    Years since quitting: 48.1  . Smokeless tobacco: Never Used  Substance and Sexual Activity  . Alcohol use: Yes    Comment: wine  . Drug use: No  . Sexual activity: Not on file

## 2020-01-14 NOTE — Telephone Encounter (Signed)
  If it had been known at our visit I would've cleared her based on my exam and HPI, so I think she should be OK. Also less than a year since her previous clearance.   I would not make her come in for another visit for clearance.  Thank you!  Legrand Como [address] [place], [place]  01/14/2020 8:56 AM

## 2020-01-19 ENCOUNTER — Other Ambulatory Visit: Payer: Self-pay | Admitting: Urology

## 2020-01-29 ENCOUNTER — Encounter (HOSPITAL_COMMUNITY): Payer: Self-pay

## 2020-01-29 NOTE — Patient Instructions (Addendum)
DUE TO COVID-19 ONLY ONE VISITOR IS ALLOWED TO COME WITH YOU AND STAY IN THE WAITING ROOM ONLY DURING PRE OP AND PROCEDURE. THE ONE VISITOR MAY VISIT WITH YOU IN YOUR PRIVATE ROOM DURING VISITING HOURS ONLY!!   COVID SWAB TESTING MUST BE COMPLETED ON:  Thursday, February 05, 2020 at  2:55PM [address] [place] [facility] (Must self quarantine after testing. Follow instructions on handout.)             Your procedure is scheduled on: Monday, February 09, 2020   Report to [facility] Main  Entrance    Report to admitting at 7:45 AM   Call this number if you have problems the morning of surgery [phone]   Do not eat food or drink liquids :After Midnight.   Oral Hygiene is also important to reduce your risk of infection.                                    Remember - BRUSH YOUR TEETH THE MORNING OF SURGERY WITH YOUR REGULAR TOOTHPASTE   Do NOT smoke after Midnight   Take these medicines the morning of surgery with A SIP OF WATER: Atenolol, Atorvastatin, Levothyroxine   May use nasal spray day of surgery                               You may not have any metal on your body including hair pins, jewelry, and body piercings             Do not wear make-up, lotions, powders, perfumes/cologne, or deodorant             Do not wear nail polish.  Do not shave  48 hours prior to surgery.              Do not bring valuables to the hospital. [place].   Contacts, dentures or bridgework may not be worn into surgery.   Bring small overnight bag day of surgery.    Special Instructions: Bring a copy of your healthcare power of attorney and living will documents         the day of surgery if you haven't scanned them in before.              Please read over the following fact sheets you were given:  [facility] - Preparing for Surgery Before surgery, you can play an important  role.  Because skin is not sterile, your skin needs to be as free of germs as possible.  You can reduce the number of germs on your skin by washing with CHG (chlorahexidine gluconate) soap before surgery.  CHG is an antiseptic cleaner which kills germs and bonds with the skin to continue killing germs even after washing. Please DO NOT use if you have an allergy to CHG or antibacterial soaps.  If your skin becomes reddened/irritated stop using the CHG and inform your nurse when you arrive at Short Stay. Do not shave (including legs and underarms) for at least 48 hours prior to the first CHG shower.  You may shave your face/neck.  Please follow these instructions carefully:  1.  Shower with CHG Soap the  night before surgery and the  morning of surgery.  2.  If you choose to wash your hair, wash your hair first as usual with your normal  shampoo.  3.  After you shampoo, rinse your hair and body thoroughly to remove the shampoo.                             4.  Use CHG as you would any other liquid soap.  You can apply chg directly to the skin and wash.  Gently with a scrungie or clean washcloth.  5.  Apply the CHG Soap to your body ONLY FROM THE NECK DOWN.   Do   not use on face/ open                           Wound or open sores. Avoid contact with eyes, ears mouth and   genitals (private parts).                       Wash face,  Genitals (private parts) with your normal soap.             6.  Wash thoroughly, paying special attention to the area where your    surgery  will be performed.  7.  Thoroughly rinse your body with warm water from the neck down.  8.  DO NOT shower/wash with your normal soap after using and rinsing off the CHG Soap.                9.  Pat yourself dry with a clean towel.            10.  Wear clean pajamas.            11.  Place clean sheets on your bed the night of your first shower and do not  sleep with pets. Day of Surgery : Do not apply any lotions/deodorants the morning  of surgery.  Please wear clean clothes to the hospital/surgery center.  FAILURE TO FOLLOW THESE INSTRUCTIONS MAY RESULT IN THE CANCELLATION OF YOUR SURGERY  PATIENT SIGNATURE_________________________________  NURSE SIGNATURE__________________________________  ________________________________________________________________________

## 2020-02-02 ENCOUNTER — Other Ambulatory Visit (HOSPITAL_COMMUNITY): Payer: Medicare Other

## 2020-02-02 ENCOUNTER — Encounter (HOSPITAL_COMMUNITY): Payer: Self-pay

## 2020-02-02 ENCOUNTER — Encounter (HOSPITAL_COMMUNITY)
Admission: RE | Admit: 2020-02-02 | Discharge: 2020-02-02 | Disposition: A | Discharge status: Home / Self Care | Payer: Medicare Other | Source: Ambulatory Visit | Attending: Urology | Admitting: Urology

## 2020-02-02 ENCOUNTER — Other Ambulatory Visit: Payer: Self-pay

## 2020-02-02 DIAGNOSIS — Z79899 Other long term (current) drug therapy: Secondary | ICD-10-CM | POA: Insufficient documentation

## 2020-02-02 DIAGNOSIS — Z87891 Personal history of nicotine dependence: Secondary | ICD-10-CM | POA: Insufficient documentation

## 2020-02-02 DIAGNOSIS — E039 Hypothyroidism, unspecified: Secondary | ICD-10-CM | POA: Insufficient documentation

## 2020-02-02 DIAGNOSIS — N200 Calculus of kidney: Secondary | ICD-10-CM | POA: Insufficient documentation

## 2020-02-02 DIAGNOSIS — E785 Hyperlipidemia, unspecified: Secondary | ICD-10-CM | POA: Insufficient documentation

## 2020-02-02 DIAGNOSIS — D649 Anemia, unspecified: Secondary | ICD-10-CM | POA: Insufficient documentation

## 2020-02-02 DIAGNOSIS — Z7982 Long term (current) use of aspirin: Secondary | ICD-10-CM | POA: Insufficient documentation

## 2020-02-02 DIAGNOSIS — Z01812 Encounter for preprocedural laboratory examination: Secondary | ICD-10-CM | POA: Insufficient documentation

## 2020-02-02 DIAGNOSIS — Z950 Presence of cardiac pacemaker: Secondary | ICD-10-CM | POA: Insufficient documentation

## 2020-02-02 DIAGNOSIS — Z86718 Personal history of other venous thrombosis and embolism: Secondary | ICD-10-CM | POA: Insufficient documentation

## 2020-02-02 DIAGNOSIS — I10 Essential (primary) hypertension: Secondary | ICD-10-CM | POA: Insufficient documentation

## 2020-02-02 HISTORY — DX: Malignant neoplasm of unspecified site of right female breast: C50.911

## 2020-02-02 HISTORY — DX: Unspecified urinary incontinence: R32

## 2020-02-02 NOTE — Progress Notes (Signed)
PCP - DR. Marshall Cork last office visit 10/09/19 IN [facility] Cardiologist - Dr. Meribeth Mattes. Tillery P.A .last office visit 12/24/19 clearance L. Ingold NP 01/13/20 telephone encounter clearance. Faxed request for pacemaker orders 01/30/20 and 02/02/20  Chest x-ray - 04/24/19 in epic EKG - 12/24/19 in epic Stress Test - greater than 2 years ECHO - greater than 2 years Cardiac Cath - N/A  Sleep Study - N/A CPAP - N/A  Fasting Blood Sugar - N/A Checks Blood Sugar _N/A____ times a day  Blood Thinner Instructions: N/A Aspirin Instructions: N/A Last Dose: N/A  Anesthesia review: Pacemaker dependent  Patient denies shortness of breath, fever, cough and chest pain at PAT appointment   Patient verbalized understanding of instructions that were given to them at the PAT appointment. Patient was also instructed that they will need to review over the PAT instructions again at home before surgery.

## 2020-02-03 ENCOUNTER — Encounter (HOSPITAL_COMMUNITY)
Admission: RE | Admit: 2020-02-03 | Discharge: 2020-02-03 | Disposition: A | Discharge status: Home / Self Care | Payer: Medicare Other | Source: Ambulatory Visit | Attending: Urology | Admitting: Urology

## 2020-02-03 DIAGNOSIS — Z79899 Other long term (current) drug therapy: Secondary | ICD-10-CM | POA: Diagnosis not present

## 2020-02-03 DIAGNOSIS — Z87891 Personal history of nicotine dependence: Secondary | ICD-10-CM | POA: Diagnosis not present

## 2020-02-03 DIAGNOSIS — I10 Essential (primary) hypertension: Secondary | ICD-10-CM | POA: Diagnosis not present

## 2020-02-03 DIAGNOSIS — E039 Hypothyroidism, unspecified: Secondary | ICD-10-CM | POA: Diagnosis not present

## 2020-02-03 DIAGNOSIS — Z7982 Long term (current) use of aspirin: Secondary | ICD-10-CM | POA: Diagnosis not present

## 2020-02-03 DIAGNOSIS — Z86718 Personal history of other venous thrombosis and embolism: Secondary | ICD-10-CM | POA: Diagnosis not present

## 2020-02-03 DIAGNOSIS — Z01812 Encounter for preprocedural laboratory examination: Secondary | ICD-10-CM | POA: Diagnosis not present

## 2020-02-03 DIAGNOSIS — Z950 Presence of cardiac pacemaker: Secondary | ICD-10-CM | POA: Diagnosis not present

## 2020-02-03 DIAGNOSIS — N200 Calculus of kidney: Secondary | ICD-10-CM | POA: Diagnosis not present

## 2020-02-03 DIAGNOSIS — E785 Hyperlipidemia, unspecified: Secondary | ICD-10-CM | POA: Diagnosis not present

## 2020-02-03 DIAGNOSIS — D649 Anemia, unspecified: Secondary | ICD-10-CM | POA: Diagnosis not present

## 2020-02-03 LAB — CBC
HCT: 37.2 % (ref 36.0–46.0)
Hemoglobin: 11.9 g/dL — ABNORMAL LOW (ref 12.0–15.0)
MCH: 29.9 pg (ref 26.0–34.0)
MCHC: 32 g/dL (ref 30.0–36.0)
MCV: 93.5 fL (ref 80.0–100.0)
Platelets: 159 10*3/uL (ref 150–400)
RBC: 3.98 MIL/uL (ref 3.87–5.11)
RDW: 13 % (ref 11.5–15.5)
WBC: 5.4 10*3/uL (ref 4.0–10.5)
nRBC: 0 % (ref 0.0–0.2)

## 2020-02-03 LAB — BASIC METABOLIC PANEL
Anion gap: 9 (ref 5–15)
BUN: 22 mg/dL (ref 8–23)
CO2: 27 mmol/L (ref 22–32)
Calcium: 9.9 mg/dL (ref 8.9–10.3)
Chloride: 107 mmol/L (ref 98–111)
Creatinine, Ser: 1.04 mg/dL — ABNORMAL HIGH (ref 0.44–1.00)
GFR calc Af Amer: 58 mL/min — ABNORMAL LOW (ref >60)
GFR calc non Af Amer: 50 mL/min — ABNORMAL LOW (ref >60)
Glucose, Bld: 100 mg/dL — ABNORMAL HIGH (ref 70–99)
Potassium: 4.5 mmol/L (ref 3.5–5.1)
Sodium: 143 mmol/L (ref 135–145)

## 2020-02-04 NOTE — Progress Notes (Signed)
Anesthesia Chart Review   Case: [id number] Date/Time: 02/09/20 1045   Procedure: NEPHROLITHOTOMY PERCUTANEOUS (Right )   Anesthesia type: General   Pre-op diagnosis: RIGHT RENAL CALCULUS   Location: [place] / [facility]   Surgeons: Kathie Rhodes, MD      DISCUSSION:84 y.o. former smoker (5 pack years, quit 11/21/71) with h/o GERD, asthma, HTN, HLD, hypothyroidism, pacemaker implanted due to CHB, right renal calculus scheduled for above procedure 02/09/20 with Dr. Kathie Rhodes.   Pt cleared by cardiology 01/14/20.  Per Cecilie Kicks, NP, "Chart reviewed as part of pre-operative protocol coverage. Given past medical history and time since last visit, based on ACC/AHA guidelines, Danielle [name] would be at acceptable risk for the planned procedure without further cardiovascular testing.   She is a moderate risk for cardiac complications with her age.  No Known CAD.  She does have a Medtronic pacemaker."  Anticipate pt can proceed with planned procedure barring acute status change.   VS: BP (!) 149/67 (BP Location: Right Arm)   Pulse 83   Temp 36.6 C (Oral)   Resp 16   Ht '5\' 5"'$  (1.651 m)   Wt 52.3 kg   SpO2 98%   BMI 19.20 kg/m   PROVIDERS: Biagio Borg, MD is PCP   Virl Axe, MD is Cardiologist  LABS: Labs reviewed: Acceptable for surgery. (all labs ordered are listed, but only abnormal results are displayed)  Labs Reviewed  BASIC METABOLIC PANEL - Abnormal; Notable for the following components:      Result Value   Glucose, Bld 100 (*)    Creatinine, Ser 1.04 (*)    GFR calc non Af Amer 50 (*)    GFR calc Af Amer 58 (*)    All other components within normal limits  CBC - Abnormal; Notable for the following components:   Hemoglobin 11.9 (*)    All other components within normal limits     IMAGES:   EKG: 12/24/19 Rate 65 bpm AV dual paced  CV: Myocardial Perfusion 10/03/2005 Overall Impression: Normal stress nuclear study.  No change compared with a prior  study of 03/2002 Past Medical History:  Diagnosis Date  . ALLERGIC RHINITIS 01/09/2008  . ANEMIA-NOS 01/09/2008   pt doesn't remember  . ANXIETY 01/09/2008  . ASTHMATIC BRONCHITIS, ACUTE 10/20/2008  . AV BLOCK, COMPLETE 12/16/2009  . Breast cancer, right ([place])   . DEPRESSION 01/09/2008  . Dizziness and giddiness 02/25/2010  . DVT, HX OF 01/09/2008   after delivery of 4 th child  . Genetic testing 06/04/2017   Ms. Phagan underwent genetic counseling and testing for hereditary cancer syndromes on 05/24/2017. Her results were negative for mutations in all 46 genes analyzed by [employer]'s 46-gene Common Hereditary Cancers Panel. Genes analyzed include: APC, ATM, AXIN2, BARD1, BMPR1A, BRCA1, BRCA2, BRIP1, CDH1, CDKN2A, CHEK2, CTNNA1, DICER1, EPCAM, GREM1, HOXB13, KIT, MEN1, MLH1, MSH2, MSH3, MSH6, MUTYH, NBN  . GERD 01/09/2008  . GLUCOSE INTOLERANCE 01/09/2008  . History of kidney stones   . HYPERLIPIDEMIA 01/09/2008  . HYPERTENSION 01/09/2008  . HYPOTHYROIDISM 01/09/2008  . Impaired glucose tolerance 07/28/2011  . INSOMNIA-SLEEP DISORDER-UNSPEC 06/15/2009  . NEPHROLITHIASIS, HX OF 01/09/2008  . OSTEOARTHRITIS, CERVICAL SPINE 02/25/2010  . Presence of permanent cardiac pacemaker    medtronic, pacemaker dependent  . Urine incontinence     Past Surgical History:  Procedure Laterality Date  . BREAST BIOPSY Right 09/20/2016   malignant  . ERCP W/ SPHICTEROTOMY     pt denies  .  HEMORRHOID SURGERY     pt denies  . MASTECTOMY Right 01/11/2017  . MASTECTOMY W/ SENTINEL NODE BIOPSY Right 01/11/2017   Procedure: RIGHT MASTECTOMY WITH SENTINEL LYMPH NODE BIOPSY;  Surgeon: [name] Klein, MD;  Location: [place];  Service: General;  Laterality: Right;  . PACEMAKER PLACEMENT Left    MedTronic EnPulse [id number] no heartbeat without pacemaker  . PERMANENT PACEMAKER GENERATOR CHANGE N/A 07/16/2012   Procedure: PERMANENT PACEMAKER GENERATOR CHANGE;  Surgeon: Deboraha Sprang, MD;  Location: [facility] CATH LAB;  Service:  Cardiovascular;  Laterality: N/A;  . POLYPECTOMY     Uterine  . TOTAL HIP ARTHROPLASTY Right 04/28/2019   Procedure: RIGHT TOTAL HIP ARTHROPLASTY ANTERIOR APPROACH;  Surgeon: Leandrew Koyanagi, MD;  Location: [place];  Service: Orthopedics;  Laterality: Right;    MEDICATIONS: . aspirin EC 81 MG tablet  . atenolol (TENORMIN) 50 MG tablet  . atorvastatin (LIPITOR) 10 MG tablet  . azelastine (ASTELIN) 0.1 % nasal spray  . calcium-vitamin D (OSCAL WITH D) 500-200 MG-UNIT tablet  . enalapril (VASOTEC) 10 MG tablet  . levothyroxine (SYNTHROID) 125 MCG tablet   No current facility-administered medications for this encounter.     [name] [facility] Pre-Surgical Testing [phone] 02/04/20  12:16 PM

## 2020-02-05 ENCOUNTER — Other Ambulatory Visit: Payer: Self-pay | Admitting: Radiology

## 2020-02-05 ENCOUNTER — Other Ambulatory Visit (HOSPITAL_COMMUNITY)
Admission: RE | Admit: 2020-02-05 | Discharge: 2020-02-05 | Disposition: A | Discharge status: Home / Self Care | Payer: Medicare Other | Source: Ambulatory Visit | Attending: Plastic Surgery | Admitting: Plastic Surgery

## 2020-02-05 DIAGNOSIS — Z01812 Encounter for preprocedural laboratory examination: Secondary | ICD-10-CM | POA: Diagnosis not present

## 2020-02-05 DIAGNOSIS — Z20822 Contact with and (suspected) exposure to covid-19: Secondary | ICD-10-CM | POA: Diagnosis not present

## 2020-02-05 LAB — SARS CORONAVIRUS 2 (TAT 6-24 HRS): SARS Coronavirus 2: NEGATIVE

## 2020-02-06 ENCOUNTER — Other Ambulatory Visit: Payer: Self-pay

## 2020-02-06 MED ORDER — ATENOLOL 50 MG PO TABS: 50.0000 mg | ORAL_TABLET | Freq: Every day | ORAL | 3 refills | Status: DC

## 2020-02-06 NOTE — Discharge Instructions (Signed)

## 2020-02-06 NOTE — H&P (Signed)
HPI: Danielle [name] is a 84 year-old female with a left renal calculus.  She did see the blood in her urine. The blood was first detected 10/09/2019. She has been told that they had blood in the urine in the past.   She does have a history of smoking. She does not have a history of exposure to chemicals or fumes. She does not have a history of urinary infections. She does have a burning sensation when she urinates. There is not a history of GU malignancy in the family. There is a history of calculus disease in the family. She is not having pain. She has not recently had unwanted weight loss.   This condition would be considered of mild to moderate severity with no modifying factors or associated signs or symptoms other than as noted above.   11/11/19: The patient had urine that appeared to be possibly infected but a urine culture on 10/09/19 was negative. Her creatinine the month before was 1.12. She had reported seeing some blood. She said it was may be a drop of blood. She has not seen any grossly bloody urine though. She does denies any flank pain. She does have urinary frequency when she drinks caffeinated beverages so she tries to avoid those. She used to smoke at parties when she was young. There is no family history of GU malignancy.     ALLERGIES: Codeine Derivatives Doxycycline Hyclate CAPS Hydrocodone-Acetaminophen CAPS Vicodin TABS    MEDICATIONS: Aspirin Ec 81 mg tablet, delayed release  Atenolol 50 mg tablet Oral  Atorvastatin Calcium 10 mg tablet  Azelastine Hcl 0.05 % drops  Enalapril Maleate 10 mg tablet  Fluticasone Propionate 50 mcg/actuation spray, suspension Nasal  Levoxyl 112 mcg tablet Oral  Senna-Plus 8.6 mg-50 mg tablet     GU PSH: Cysto Uretero Lithotripsy - 2012 Cystoscopy Insert Stent - 2012 Cystoscopy Ureteroscopy - 2010 Locm 300-399Mg /Ml Iodine,1Ml - 11/27/2019       PSH Notes: Pacemaker Placement, Cystoscopy With Pyeloscopy With Lithotripsy, Cystoscopy  With Insertion Of Ureteral Stent Right, Pacemaker Placement, Cervical Surgery (Gyn), Back Surgery, Cystoscopy With Ureteroscopy   NON-GU PSH: Breast mastectomy Heart Surgery (Unspecified) Hip Replacement     GU PMH: Microscopic hematuria, She is going to proceed with further evaluation of her hematuria with cystoscopy and CT scan. - 11/11/2019 History of urolithiasis, History of kidney stones - 2016 Acute Cystitis/UTI, Acute Cystitis - 2014 LLQ pain, Abdominal Pain In The Left Lower Belly (LLQ) - 2014 Oth GU systems Signs/Symptoms, Bladder pain - 2014 Other microscopic hematuria, Microscopic hematuria - 2014 Personal Hx Oth Urinary System diseases, History of chronic cystitis - 2014 Postmenopausal atrophic vaginitis, Atrophic vaginitis - 2014 Urinary Frequency, Urinary frequency - 2014 Urinary Urgency, Urinary urgency - 2014      PMH Notes: She has a history of chronic, recurrent cystitis.   She also has a history of nephrolithiasis and has undergone ureteroscopy and laser lithotripsy in 7/02. She was noted to be stone free by ultrasound in 2007. A CT scan in 4/10 revealed either a single stone or a cluster of stones in the right lower pole that measured 9 mm in diameter that were nonobstructing.  Ureteroscopic management of a right renal pelvic stone in 3/12.  Stone analysis: 90% calcium oxalate and 10% uric acid.  A CT scan done in 9/19 revealed small, bilateral renal cysts as well as a 5 mm right renal calculus that was nonobstructing.   She is pacemaker dependent with complete heart block so  cannot have lithotripsy.       NON-GU PMH: Encounter for general adult medical examination without abnormal findings, Encounter for preventive health examination - 2016 Arrhythmia, Rhythm Disorder - 2014 Cardiac murmur, unspecified, Murmurs - 2014 Personal history of other endocrine, nutritional and metabolic disease, History of hypercholesterolemia - 2014, History of hypothyroidism, -  2014    FAMILY HISTORY: 1 son - Son 3 daughters - Daughter Breast Cancer - Mother Death In The Family Father - Runs In Family Family Health Status Number - Runs In Family   SOCIAL HISTORY: Marital Status: Married Preferred Language: English; Ethnicity: Not Hispanic Or Latino; Race: White Historical Smoking Status: Patient smoked. Has never drank.  Does not drink caffeine.     Notes: Never A Smoker, Occupation:, Caffeine Use, Marital History - Currently Married, Tobacco Use, Alcohol Use   REVIEW OF SYSTEMS:    GU Review Female:   Patient denies frequent urination, hard to postpone urination, burning /pain with urination, get up at night to urinate, leakage of urine, stream starts and stops, trouble starting your stream, have to strain to urinate, and being pregnant.  Gastrointestinal (Upper):   Patient denies nausea, vomiting, and indigestion/ heartburn.  Gastrointestinal (Lower):   Patient denies diarrhea and constipation.  Constitutional:   Patient denies fever, night sweats, weight loss, and fatigue.  Skin:   Patient denies itching and skin rash/ lesion.  Eyes:   Patient denies blurred vision and double vision.  Ears/ Nose/ Throat:   Patient denies sore throat and sinus problems.  Hematologic/Lymphatic:   Patient denies swollen glands and easy bruising.  Cardiovascular:   Patient denies leg swelling and chest pains.  Respiratory:   Patient denies cough and shortness of breath.  Endocrine:   Patient denies excessive thirst.  Musculoskeletal:   Patient denies back pain and joint pain.  Neurological:   Patient denies headaches and dizziness.  Psychologic:   Patient denies depression and anxiety.   VITAL SIGNS:    Weight 108 lb / 48.99 kg  Height 63 in / 160.02 cm  BP 175/72 mmHg  Pulse 85 /min  Temperature 96.8 F / 36 C  BMI 19.1 kg/m   MULTI-SYSTEM PHYSICAL EXAMINATION:    Constitutional: Well-nourished. No physical deformities. Normally developed. Good grooming.  Neck:  Neck symmetrical, not swollen. Normal tracheal position.  Respiratory: No labored breathing, no use of accessory muscles.   Cardiovascular: Normal temperature, normal extremity pulses, no swelling, no varicosities.  Lymphatic: No enlargement of neck, axillae, groin.  Skin: No paleness, no jaundice, no cyanosis. No lesion, no ulcer, no rash.  Neurologic / Psychiatric: Oriented to time, oriented to place, oriented to person. No depression, no anxiety, no agitation.  Gastrointestinal: No mass, no tenderness, no rigidity, non obese abdomen.  Eyes: Normal conjunctivae. Normal eyelids.  Ears, Nose, Mouth, and Throat: Left ear no scars, no lesions, no masses. Right ear no scars, no lesions, no masses. Nose no scars, no lesions, no masses. Normal hearing. Normal lips.  Musculoskeletal: Normal gait and station of head and neck.   PAST DATA REVIEWED:  Source Of History:  Patient  Lab Test Review:   BUN/Creatinine  Records Review:   Previous Patient Records  X-Ray Review: C.T. Hematuria: Reviewed Films. Reviewed Report. Discussed With Patient. EXAM: CT ABDOMEN AND PELVIS WITHOUT AND WITH CONTRAST TECHNIQUE: Multidetector CT imaging of the abdomen and pelvis was performed following the standard protocol before and following the bolus administration of intravenous contrast. CONTRAST: 125 cc of Omnipaque 300 COMPARISON: 08/19/2018  FINDINGS: Lower chest: No acute abnormality. Hepatobiliary: Posterolateral dome of liver lesion measures 5 mm and is too small to characterize. 8 mm low-density structure within posterior right hepatic lobe is also unchanged. These likely represent a benign process. Gallbladder negative. Increase scratch set interval increase caliber of the CBD which measures up to 1.2 cm proximally Pancreas: No pancreatic inflammation or main duct dilatation. Cystic lesion within the body of pancreas measures 1.2 cm and appears new from previous exam. Spleen: Normal in size without focal abnormality.  Adrenals/Urinary Tract: Normal appearance of the adrenal glands. Right-sided pelvocaliectasis. Within the right renal pelvis there is a stone measuring 1.3 x 0.9 cm, image 28/2. 8 mm low-density cortical based lesion arising from the posterior aspect of left kidney is too small to reliably characterize. Urinary bladder is unremarkable. Stomach/Bowel: Stomach is within normal limits. Appendix appears normal. No evidence of bowel wall thickening, distention, or inflammatory changes. Vascular/Lymphatic: Aortic atherosclerosis. No aneurysm. No abdominopelvic adenopathy Reproductive: Uterus and bilateral adnexa are unremarkable. Other: No free fluid or fluid collections. Musculoskeletal: Scoliosis and degenerative disc disease. Previous right hip arthroplasty device. Note: The arthroplasty device creates considerable streak artifact which obscures the urinary bladder. IMPRESSION: 1. There is a stone within the right renal pelvis measuring 1.3 x 0.9 cm with associated right-sided pelvocaliectasis. 2. Cystic lesion within body of pancreas measures 1.2 cm. Follow-up imaging in 24 months with without and with contrast pancreas protocol CT or MRI is recommended. 3. Aortic atherosclerosis.    11/27/19  General Chemistry  BUN 19.0 mg/dL  Creatinine 1.1 mg/dL   PROCEDURES:          Urinalysis w/Scope Dipstick Dipstick Cont'd Micro  Color: Yellow Bilirubin: Neg mg/dL WBC/hpf: 0 - 5/hpf  Appearance: Cloudy Ketones: Neg mg/dL RBC/hpf: >60/hpf  Specific Gravity: 1.030 Blood: 3+ ery/uL Bacteria: Rare (0-9/hpf)  pH: 5.5 Protein: 3+ mg/dL Cystals: Ca Oxalate  Glucose: Neg mg/dL Urobilinogen: 0.2 mg/dL Casts: NS (Not Seen)    Nitrites: Neg Trichomonas: Not Present    Leukocyte Esterase: Trace leu/uL Mucous: Present      Epithelial Cells: 0 - 5/hpf      Yeast: NS (Not Seen)      Sperm: Not Present    ASSESSMENT/PLAN:     ICD-10 Details  1 GU:   Microscopic hematuria - [id number] Acute, Uncomplicated - Her microscopic  hematuria appears to be secondary to the stone located in her right renal pelvis.  2   Renal calculus - N20.0 Left, Chronic, Exacerbation - 1.3cm, 1100 HU Discussing all the options for management she does not want to undergo ureteroscopy due to the fact that it may require more than 1 treatment and told me that she would prefer the procedure that would render her stone free with the greatest probability. She has therefore elected to proceed with PCNL.

## 2020-02-09 ENCOUNTER — Ambulatory Visit (HOSPITAL_COMMUNITY): Payer: Medicare Other

## 2020-02-09 ENCOUNTER — Ambulatory Visit (HOSPITAL_COMMUNITY): Payer: Medicare Other | Admitting: Anesthesiology

## 2020-02-09 ENCOUNTER — Ambulatory Visit (HOSPITAL_COMMUNITY): Payer: Medicare Other | Admitting: Physician Assistant

## 2020-02-09 ENCOUNTER — Ambulatory Visit (HOSPITAL_COMMUNITY)
Admission: RE | Admit: 2020-02-09 | Discharge: 2020-02-10 | Disposition: A | Discharge status: Home / Self Care | Payer: Medicare Other | Attending: Urology | Admitting: Urology

## 2020-02-09 ENCOUNTER — Other Ambulatory Visit: Payer: Self-pay

## 2020-02-09 ENCOUNTER — Ambulatory Visit (HOSPITAL_COMMUNITY)
Admission: RE | Admit: 2020-02-09 | Discharge: 2020-02-09 | Disposition: A | Discharge status: Home / Self Care | Payer: Medicare Other | Source: Ambulatory Visit | Attending: Urology | Admitting: Urology

## 2020-02-09 ENCOUNTER — Encounter (HOSPITAL_COMMUNITY): Payer: Self-pay | Admitting: Urology

## 2020-02-09 ENCOUNTER — Encounter (HOSPITAL_COMMUNITY)
Admission: RE | Disposition: A | Discharge status: Home / Self Care | Payer: Self-pay | Source: Home / Self Care | Attending: Urology

## 2020-02-09 DIAGNOSIS — G4700 Insomnia, unspecified: Secondary | ICD-10-CM | POA: Insufficient documentation

## 2020-02-09 DIAGNOSIS — N200 Calculus of kidney: Secondary | ICD-10-CM

## 2020-02-09 DIAGNOSIS — I10 Essential (primary) hypertension: Secondary | ICD-10-CM | POA: Insufficient documentation

## 2020-02-09 DIAGNOSIS — K219 Gastro-esophageal reflux disease without esophagitis: Secondary | ICD-10-CM | POA: Insufficient documentation

## 2020-02-09 DIAGNOSIS — Z79890 Hormone replacement therapy (postmenopausal): Secondary | ICD-10-CM | POA: Insufficient documentation

## 2020-02-09 DIAGNOSIS — Z9011 Acquired absence of right breast and nipple: Secondary | ICD-10-CM | POA: Insufficient documentation

## 2020-02-09 DIAGNOSIS — Z885 Allergy status to narcotic agent status: Secondary | ICD-10-CM | POA: Insufficient documentation

## 2020-02-09 DIAGNOSIS — Z96641 Presence of right artificial hip joint: Secondary | ICD-10-CM | POA: Diagnosis not present

## 2020-02-09 DIAGNOSIS — E785 Hyperlipidemia, unspecified: Secondary | ICD-10-CM | POA: Diagnosis not present

## 2020-02-09 DIAGNOSIS — Z86718 Personal history of other venous thrombosis and embolism: Secondary | ICD-10-CM | POA: Diagnosis not present

## 2020-02-09 DIAGNOSIS — Z87891 Personal history of nicotine dependence: Secondary | ICD-10-CM | POA: Insufficient documentation

## 2020-02-09 DIAGNOSIS — N132 Hydronephrosis with renal and ureteral calculous obstruction: Secondary | ICD-10-CM | POA: Diagnosis not present

## 2020-02-09 DIAGNOSIS — E039 Hypothyroidism, unspecified: Secondary | ICD-10-CM | POA: Insufficient documentation

## 2020-02-09 DIAGNOSIS — Z950 Presence of cardiac pacemaker: Secondary | ICD-10-CM | POA: Insufficient documentation

## 2020-02-09 DIAGNOSIS — Z881 Allergy status to other antibiotic agents status: Secondary | ICD-10-CM | POA: Diagnosis not present

## 2020-02-09 DIAGNOSIS — Z79899 Other long term (current) drug therapy: Secondary | ICD-10-CM | POA: Diagnosis not present

## 2020-02-09 HISTORY — PX: NEPHROLITHOTOMY: SHX5134

## 2020-02-09 HISTORY — PX: IR URETERAL STENT RIGHT NEW ACCESS W/O SEP NEPHROSTOMY CATH: IMG6076

## 2020-02-09 HISTORY — DX: Calculus of kidney: N20.0

## 2020-02-09 LAB — PROTIME-INR
INR: 1 (ref 0.8–1.2)
Prothrombin Time: 13.3 seconds (ref 11.4–15.2)

## 2020-02-09 SURGERY — NEPHROLITHOTOMY PERCUTANEOUS
Anesthesia: General | Site: Flank | Laterality: Right

## 2020-02-09 MED ORDER — CIPROFLOXACIN IN D5W 400 MG/200ML IV SOLN: 400.0000 mg | Freq: Once | INTRAVENOUS | Status: DC

## 2020-02-09 MED ORDER — ACETAMINOPHEN 500 MG PO TABS
1000.0000 mg | ORAL_TABLET | Freq: Four times a day (QID) | ORAL | Status: DC
  Administered 2020-02-09 – 2020-02-10 (×3): 1000 mg via ORAL
  Filled 2020-02-09 (×3): qty 2

## 2020-02-09 MED ORDER — ONDANSETRON HCL 4 MG/2ML IJ SOLN
INTRAMUSCULAR | Status: AC
  Filled 2020-02-09: qty 2

## 2020-02-09 MED ORDER — LACTATED RINGERS IV SOLN: INTRAVENOUS | Status: DC

## 2020-02-09 MED ORDER — SUCCINYLCHOLINE CHLORIDE 200 MG/10ML IV SOSY
PREFILLED_SYRINGE | INTRAVENOUS | Status: DC | PRN
  Administered 2020-02-09: 80 mg via INTRAVENOUS

## 2020-02-09 MED ORDER — FENTANYL CITRATE (PF) 100 MCG/2ML IJ SOLN
INTRAMUSCULAR | Status: AC | PRN
  Administered 2020-02-09 (×2): 25 ug via INTRAVENOUS
  Administered 2020-02-09: 50 ug via INTRAVENOUS

## 2020-02-09 MED ORDER — FENTANYL CITRATE (PF) 100 MCG/2ML IJ SOLN
INTRAMUSCULAR | Status: AC
  Filled 2020-02-09: qty 2

## 2020-02-09 MED ORDER — ACETAMINOPHEN 10 MG/ML IV SOLN
INTRAVENOUS | Status: AC
  Filled 2020-02-09: qty 100

## 2020-02-09 MED ORDER — DEXAMETHASONE SODIUM PHOSPHATE 10 MG/ML IJ SOLN
INTRAMUSCULAR | Status: DC | PRN
  Administered 2020-02-09: 10 mg via INTRAVENOUS

## 2020-02-09 MED ORDER — SUGAMMADEX SODIUM 200 MG/2ML IV SOLN
INTRAVENOUS | Status: DC | PRN
  Administered 2020-02-09: 200 mg via INTRAVENOUS

## 2020-02-09 MED ORDER — PROPOFOL 10 MG/ML IV BOLUS
INTRAVENOUS | Status: AC
  Filled 2020-02-09: qty 20

## 2020-02-09 MED ORDER — PROMETHAZINE HCL 25 MG/ML IJ SOLN
6.2500 mg | INTRAMUSCULAR | Status: DC | PRN
  Administered 2020-02-09: 6.25 mg via INTRAVENOUS

## 2020-02-09 MED ORDER — FENTANYL CITRATE (PF) 100 MCG/2ML IJ SOLN
INTRAMUSCULAR | Status: DC | PRN
  Administered 2020-02-09 (×2): 50 ug via INTRAVENOUS

## 2020-02-09 MED ORDER — CIPROFLOXACIN IN D5W 400 MG/200ML IV SOLN
400.0000 mg | INTRAVENOUS | Status: AC
  Administered 2020-02-09: 400 mg via INTRAVENOUS

## 2020-02-09 MED ORDER — MEPERIDINE HCL 50 MG/ML IJ SOLN: 6.2500 mg | INTRAMUSCULAR | Status: DC | PRN

## 2020-02-09 MED ORDER — DEXAMETHASONE SODIUM PHOSPHATE 10 MG/ML IJ SOLN
INTRAMUSCULAR | Status: AC
  Filled 2020-02-09: qty 1

## 2020-02-09 MED ORDER — ACETAMINOPHEN 325 MG PO TABS: 325.0000 mg | ORAL_TABLET | Freq: Once | ORAL | Status: DC | PRN

## 2020-02-09 MED ORDER — LIDOCAINE HCL 1 % IJ SOLN
INTRAMUSCULAR | Status: AC
  Filled 2020-02-09: qty 20

## 2020-02-09 MED ORDER — FENTANYL CITRATE (PF) 100 MCG/2ML IJ SOLN
25.0000 ug | INTRAMUSCULAR | Status: DC | PRN
  Administered 2020-02-09 (×3): 50 ug via INTRAVENOUS

## 2020-02-09 MED ORDER — ROCURONIUM BROMIDE 10 MG/ML (PF) SYRINGE
PREFILLED_SYRINGE | INTRAVENOUS | Status: DC | PRN
  Administered 2020-02-09: 40 mg via INTRAVENOUS

## 2020-02-09 MED ORDER — TRAMADOL HCL 50 MG PO TABS
50.0000 mg | ORAL_TABLET | Freq: Four times a day (QID) | ORAL | Status: DC | PRN
  Administered 2020-02-10: 50 mg via ORAL
  Filled 2020-02-09: qty 1

## 2020-02-09 MED ORDER — SODIUM CHLORIDE 0.9 % IV SOLN: INTRAVENOUS | Status: DC

## 2020-02-09 MED ORDER — PROMETHAZINE HCL 25 MG/ML IJ SOLN
INTRAMUSCULAR | Status: AC
  Filled 2020-02-09: qty 1

## 2020-02-09 MED ORDER — BACITRACIN-NEOMYCIN-POLYMYXIN 400-5-5000 EX OINT: 1.0000 "application " | TOPICAL_OINTMENT | Freq: Three times a day (TID) | CUTANEOUS | Status: DC | PRN

## 2020-02-09 MED ORDER — MIDAZOLAM HCL 2 MG/2ML IJ SOLN
INTRAMUSCULAR | Status: AC | PRN
  Administered 2020-02-09 (×3): 0.5 mg via INTRAVENOUS
  Administered 2020-02-09: 1 mg via INTRAVENOUS

## 2020-02-09 MED ORDER — ONDANSETRON HCL 4 MG/2ML IJ SOLN
4.0000 mg | INTRAMUSCULAR | Status: DC | PRN
  Administered 2020-02-10: 4 mg via INTRAVENOUS
  Filled 2020-02-09: qty 2

## 2020-02-09 MED ORDER — LIDOCAINE HCL (PF) 1 % IJ SOLN
INTRAMUSCULAR | Status: AC | PRN
  Administered 2020-02-09: 10 mL via INTRADERMAL

## 2020-02-09 MED ORDER — OXYBUTYNIN CHLORIDE 5 MG PO TABS: 5.0000 mg | ORAL_TABLET | Freq: Three times a day (TID) | ORAL | Status: DC | PRN

## 2020-02-09 MED ORDER — LIDOCAINE 2% (20 MG/ML) 5 ML SYRINGE
INTRAMUSCULAR | Status: DC | PRN
  Administered 2020-02-09: 60 mg via INTRAVENOUS

## 2020-02-09 MED ORDER — IOHEXOL 300 MG/ML  SOLN
50.0000 mL | Freq: Once | INTRAMUSCULAR | Status: AC | PRN
  Administered 2020-02-09: 10 mL

## 2020-02-09 MED ORDER — CIPROFLOXACIN IN D5W 400 MG/200ML IV SOLN
INTRAVENOUS | Status: AC
  Filled 2020-02-09: qty 200

## 2020-02-09 MED ORDER — HYDROMORPHONE HCL 1 MG/ML IJ SOLN: 0.5000 mg | INTRAMUSCULAR | Status: DC | PRN

## 2020-02-09 MED ORDER — SODIUM CHLORIDE 0.9 % IR SOLN
Status: DC | PRN
  Administered 2020-02-09 (×2): 6000 mL

## 2020-02-09 MED ORDER — LIDOCAINE 2% (20 MG/ML) 5 ML SYRINGE
INTRAMUSCULAR | Status: AC
  Filled 2020-02-09: qty 5

## 2020-02-09 MED ORDER — PHENYLEPHRINE 40 MCG/ML (10ML) SYRINGE FOR IV PUSH (FOR BLOOD PRESSURE SUPPORT)
PREFILLED_SYRINGE | INTRAVENOUS | Status: DC | PRN
  Administered 2020-02-09: 80 ug via INTRAVENOUS

## 2020-02-09 MED ORDER — ACETAMINOPHEN 160 MG/5ML PO SOLN: 325.0000 mg | Freq: Once | ORAL | Status: DC | PRN

## 2020-02-09 MED ORDER — PROPOFOL 10 MG/ML IV BOLUS
INTRAVENOUS | Status: DC | PRN
  Administered 2020-02-09: 90 mg via INTRAVENOUS

## 2020-02-09 MED ORDER — MIDAZOLAM HCL 2 MG/2ML IJ SOLN
INTRAMUSCULAR | Status: AC
  Filled 2020-02-09: qty 4

## 2020-02-09 MED ORDER — EPHEDRINE SULFATE-NACL 50-0.9 MG/10ML-% IV SOSY
PREFILLED_SYRINGE | INTRAVENOUS | Status: DC | PRN
  Administered 2020-02-09 (×2): 10 mg via INTRAVENOUS

## 2020-02-09 MED ORDER — ACETAMINOPHEN 10 MG/ML IV SOLN
1000.0000 mg | Freq: Once | INTRAVENOUS | Status: DC | PRN
  Administered 2020-02-09: 1000 mg via INTRAVENOUS

## 2020-02-09 SURGICAL SUPPLY — 57 items
AGENT HMST KT MTR STRL THRMB (HEMOSTASIS) ×1
APL ESCP 34 STRL LF DISP (HEMOSTASIS) ×1
APL PRP STRL LF DISP 70% ISPRP (MISCELLANEOUS) ×1
APL SKNCLS STERI-STRIP NONHPOA (GAUZE/BANDAGES/DRESSINGS) ×1
APPLICATOR SURGIFLO ENDO (HEMOSTASIS) ×1 IMPLANT
BAG DRN RND TRDRP ANRFLXCHMBR (UROLOGICAL SUPPLIES)
BAG URINE DRAIN 2000ML AR STRL (UROLOGICAL SUPPLIES) IMPLANT
BASKET ZERO TIP NITINOL 2.4FR (BASKET) ×1 IMPLANT
BENZOIN TINCTURE PRP APPL 2/3 (GAUZE/BANDAGES/DRESSINGS) ×2 IMPLANT
BLADE SURG 15 STRL LF DISP TIS (BLADE) ×1 IMPLANT
BLADE SURG 15 STRL SS (BLADE) ×2
BSKT STON RTRVL ZERO TP 2.4FR (BASKET) ×1
CATH FOLEY 2W COUNCIL 20FR 5CC (CATHETERS) IMPLANT
CATH FOLEY 2WAY SLVR  5CC 18FR (CATHETERS) ×2
CATH FOLEY 2WAY SLVR 5CC 18FR (CATHETERS) IMPLANT
CATH IMAGER II 65CM (CATHETERS) ×1 IMPLANT
CATH X-FORCE N30 NEPHROSTOMY (TUBING) ×2 IMPLANT
CHLORAPREP W/TINT 26 (MISCELLANEOUS) ×2 IMPLANT
COVER SURGICAL LIGHT HANDLE (MISCELLANEOUS) ×2 IMPLANT
COVER WAND RF STERILE (DRAPES) IMPLANT
DRAPE C-ARM 42X120 X-RAY (DRAPES) ×2 IMPLANT
DRAPE LINGEMAN PERC (DRAPES) ×2 IMPLANT
DRAPE SURG IRRIG POUCH 19X23 (DRAPES) ×2 IMPLANT
DRSG PAD ABDOMINAL 8X10 ST (GAUZE/BANDAGES/DRESSINGS) ×4 IMPLANT
DRSG TEGADERM 4X4.75 (GAUZE/BANDAGES/DRESSINGS) ×2 IMPLANT
DRSG TEGADERM 8X12 (GAUZE/BANDAGES/DRESSINGS) IMPLANT
FIBER LASER TRAC TIP (UROLOGICAL SUPPLIES) IMPLANT
GAUZE SPONGE 4X4 12PLY STRL (GAUZE/BANDAGES/DRESSINGS) ×2 IMPLANT
GLOVE BIOGEL M 8.0 STRL (GLOVE) ×2 IMPLANT
GOWN STRL REUS W/TWL XL LVL3 (GOWN DISPOSABLE) ×2 IMPLANT
GUIDEWIRE AMPLAZ .035X145 (WIRE) ×4 IMPLANT
GUIDEWIRE STR DUAL SENSOR (WIRE) ×4 IMPLANT
HOLDER FOLEY CATH W/STRAP (MISCELLANEOUS) ×2 IMPLANT
KIT BASIN OR (CUSTOM PROCEDURE TRAY) ×2 IMPLANT
KIT PROBE 340X3.4XDISP GRN (MISCELLANEOUS) IMPLANT
KIT PROBE TRILOGY 3.4X340 (MISCELLANEOUS)
KIT PROBE TRILOGY 3.9X350 (MISCELLANEOUS) ×1 IMPLANT
KIT TURNOVER KIT A (KITS) IMPLANT
MANIFOLD NEPTUNE II (INSTRUMENTS) ×2 IMPLANT
NS IRRIG 1000ML POUR BTL (IV SOLUTION) ×2 IMPLANT
PACK CYSTO (CUSTOM PROCEDURE TRAY) ×2 IMPLANT
SHEATH PEELAWAY SET 9 (SHEATH) ×2 IMPLANT
SPONGE LAP 4X18 RFD (DISPOSABLE) ×2 IMPLANT
STENT URET 6FRX24 CONTOUR (STENTS) ×1 IMPLANT
STOPCOCK 4 WAY LG BORE MALE ST (IV SETS) ×2 IMPLANT
SURGIFLO W/THROMBIN 8M KIT (HEMOSTASIS) ×1 IMPLANT
SUT MNCRL AB 4-0 PS2 18 (SUTURE) ×2 IMPLANT
SUT SILK 2 0 30  PSL (SUTURE)
SUT SILK 2 0 30 PSL (SUTURE) IMPLANT
SYR 10ML LL (SYRINGE) ×2 IMPLANT
SYR 20ML LL LF (SYRINGE) ×4 IMPLANT
TOWEL OR 17X26 10 PK STRL BLUE (TOWEL DISPOSABLE) ×2 IMPLANT
TOWEL OR NON WOVEN STRL DISP B (DISPOSABLE) ×2 IMPLANT
TRAY FOLEY MTR SLVR 16FR STAT (SET/KITS/TRAYS/PACK) ×2 IMPLANT
TUBING CONNECTING 10 (TUBING) ×4 IMPLANT
TUBING STONE CATCHER TRILOGY (MISCELLANEOUS) ×2 IMPLANT
TUBING UROLOGY SET (TUBING) ×2 IMPLANT

## 2020-02-09 NOTE — Anesthesia Postprocedure Evaluation (Signed)
Anesthesia Post Note  Patient: Danielle [name]  Procedure(s) Performed: NEPHROLITHOTOMY PERCUTANEOUS (Right Flank)     Patient location during evaluation: PACU Anesthesia Type: General Level of consciousness: awake and alert Pain management: pain level controlled Vital Signs Assessment: post-procedure vital signs reviewed and stable Respiratory status: spontaneous breathing, nonlabored ventilation, respiratory function stable and patient connected to nasal cannula oxygen Cardiovascular status: blood pressure returned to baseline and stable Postop Assessment: no apparent nausea or vomiting Anesthetic complications: no    Last Vitals:  Vitals:   02/09/20 1423 02/09/20 1430  BP: (!) 150/71 (!) 157/71  Pulse: 60 61  Resp: 15 (!) 8  Temp:    SpO2: 100% 100%    Last Pain:  Vitals:   02/09/20 1458  TempSrc:   PainSc: 0-No pain                 Effie Berkshire

## 2020-02-09 NOTE — Transfer of Care (Signed)
Immediate Anesthesia Transfer of Care Note  Patient: Danielle [name]  Procedure(s) Performed: Procedure(s): NEPHROLITHOTOMY PERCUTANEOUS (Right)  Patient Location: PACU  Anesthesia Type:General  Level of Consciousness: Alert, Awake, Oriented  Airway & Oxygen Therapy: Patient Spontanous Breathing  Post-op Assessment: Report given to RN  Post vital signs: Reviewed and stable  Last Vitals:  Vitals:   02/09/20 1018 02/09/20 1051  BP: 138/74 (!) 142/79  Pulse: 62 61  Resp: 18 18  Temp: 36.5 C   SpO2: [id number]    Complications: No apparent anesthesia complications

## 2020-02-09 NOTE — Anesthesia Preprocedure Evaluation (Addendum)
Anesthesia Evaluation  Patient identified by MRN, date of birth, ID band Patient awake    Reviewed: Allergy & Precautions, NPO status , Patient's Chart, lab work & pertinent test results  Airway Mallampati: II  TM Distance: >3 FB Neck ROM: Full    Dental  (+) Partial Upper, Dental Advisory Given   Pulmonary former smoker,    breath sounds clear to auscultation       Cardiovascular hypertension, Pt. on home beta blockers and Pt. on medications + dysrhythmias + pacemaker  Rhythm:Regular Rate:Normal     Neuro/Psych PSYCHIATRIC DISORDERS Anxiety Depression  Neuromuscular disease    GI/Hepatic Neg liver ROS, GERD  ,  Endo/Other  Hypothyroidism   Renal/GU      Musculoskeletal   Abdominal Normal abdominal exam  (+)   Peds  Hematology   Anesthesia Other Findings   Reproductive/Obstetrics                            Anesthesia Physical Anesthesia Plan  ASA: III  Anesthesia Plan: General   Post-op Pain Management:    Induction: Intravenous  PONV Risk Score and Plan: 4 or greater and Ondansetron and Treatment may vary due to age or medical condition  Airway Management Planned: Oral ETT  Additional Equipment: None  Intra-op Plan:   Post-operative Plan: Extubation in OR  Informed Consent: I have reviewed the patients History and Physical, chart, labs and discussed the procedure including the risks, benefits and alternatives for the proposed anesthesia with the patient or authorized representative who has indicated his/her understanding and acceptance.     Dental advisory given  Plan Discussed with: CRNA  Anesthesia Plan Comments:       Anesthesia Quick Evaluation

## 2020-02-09 NOTE — Consult Note (Signed)
Chief Complaint: Patient was seen in consultation today for right percutaneous nephrostomy/nephroureteral catheter placement  Referring Physician(s):Ottelin,M  Supervising Physician: Markus Daft  Patient Status: [facility] - Out-pt TBA  History of Present Illness: Danielle [name] is an 84 y.o. female with past medical history significant for right breast cancer, depression, remote DVT, hyperlipidemia, hypertension, hypothyroidism, pacemaker and nephrolithiasis/microhematuria.  She has had prior ureteroscopy and laser lithotripsy in 2002, ureteroscopic management of right renal pelvic stone in 2012 and recent CT revealing stone within the right renal pelvis measuring 1.3 cm with associated right-sided pelvocaliectasis along with 1.2 cm cystic lesion within the body of the pancreas.  She presents today for right percutaneous nephrostomy/nephroureteral catheter placement prior to planned nephrolithotomy.  Past Medical History:  Diagnosis Date  . ALLERGIC RHINITIS 01/09/2008  . ANEMIA-NOS 01/09/2008   pt doesn't remember  . ANXIETY 01/09/2008  . ASTHMATIC BRONCHITIS, ACUTE 10/20/2008  . AV BLOCK, COMPLETE 12/16/2009  . Breast cancer, right ([place])   . DEPRESSION 01/09/2008  . Dizziness and giddiness 02/25/2010  . DVT, HX OF 01/09/2008   after delivery of 4 th child  . Genetic testing 06/04/2017   Ms. Courser underwent genetic counseling and testing for hereditary cancer syndromes on 05/24/2017. Her results were negative for mutations in all 46 genes analyzed by [employer]'s 46-gene Common Hereditary Cancers Panel. Genes analyzed include: APC, ATM, AXIN2, BARD1, BMPR1A, BRCA1, BRCA2, BRIP1, CDH1, CDKN2A, CHEK2, CTNNA1, DICER1, EPCAM, GREM1, HOXB13, KIT, MEN1, MLH1, MSH2, MSH3, MSH6, MUTYH, NBN  . GERD 01/09/2008  . GLUCOSE INTOLERANCE 01/09/2008  . History of kidney stones   . HYPERLIPIDEMIA 01/09/2008  . HYPERTENSION 01/09/2008  . HYPOTHYROIDISM 01/09/2008  . Impaired glucose tolerance 07/28/2011  .  INSOMNIA-SLEEP DISORDER-UNSPEC 06/15/2009  . NEPHROLITHIASIS, HX OF 01/09/2008  . OSTEOARTHRITIS, CERVICAL SPINE 02/25/2010  . Presence of permanent cardiac pacemaker    medtronic, pacemaker dependent  . Urine incontinence     Past Surgical History:  Procedure Laterality Date  . BREAST BIOPSY Right 09/20/2016   malignant  . ERCP W/ SPHICTEROTOMY     pt denies  . HEMORRHOID SURGERY     pt denies  . MASTECTOMY Right 01/11/2017  . MASTECTOMY W/ SENTINEL NODE BIOPSY Right 01/11/2017   Procedure: RIGHT MASTECTOMY WITH SENTINEL LYMPH NODE BIOPSY;  Surgeon: [name] Klein, MD;  Location: [place];  Service: General;  Laterality: Right;  . PACEMAKER PLACEMENT Left    MedTronic EnPulse [id number] no heartbeat without pacemaker  . PERMANENT PACEMAKER GENERATOR CHANGE N/A 07/16/2012   Procedure: PERMANENT PACEMAKER GENERATOR CHANGE;  Surgeon: Deboraha Sprang, MD;  Location: [facility] C[facility];  Service: Cardiovascular;  Laterality: N/A;  . POLYPECTOMY     Uterine  . TOTAL HIP ARTHROPLASTY Right 04/28/2019   Procedure: RIGHT TOTAL HIP ARTHROPLASTY ANTERIOR APPROACH;  Surgeon: Leandrew Koyanagi, MD;  Location: [place];  Service: Orthopedics;  Laterality: Right;    Allergies: Codeine and Oxycodone  Medications: Prior to Admission medications   Medication Sig Start Date End Date Taking? Authorizing Provider  atenolol (TENORMIN) 50 MG tablet Take 1 tablet (50 mg total) by mouth daily. 02/06/20  Yes Biagio Borg, MD  atorvastatin (LIPITOR) 10 MG tablet TAKE 1 TABLET(10 MG) BY MOUTH DAILY Patient taking differently: Take 10 mg by mouth daily.  11/10/19  Yes Biagio Borg, MD  azelastine (ASTELIN) 0.1 % nasal spray Place 1 spray into both nostrils as needed (Summer Allergies).    Yes [provider]  calcium-vitamin D (OSCAL WITH D) 500-200  MG-UNIT tablet Take 1 tablet by mouth daily with breakfast.   Yes [provider]  enalapril (VASOTEC) 10 MG tablet Take 1 tablet (10 mg total) by mouth daily.  11/10/19  Yes Biagio Borg, MD  levothyroxine (SYNTHROID) 125 MCG tablet Take 1 tablet (125 mcg total) by mouth daily. 09/11/19  Yes Biagio Borg, MD     Family History  Problem Relation Age of Onset  . Sudden death Mother        d.47s  . Breast cancer Mother 55  . Pulmonary embolism Father        Possible  . Lung cancer Paternal Grandfather        history of smoking  . Breast cancer Maternal Aunt        d.78s    Social History   Socioeconomic History  . Marital status: Married    Spouse name: Not on file  . Number of children: Not on file  . Years of education: Not on file  . Highest education level: Not on file  Occupational History  . Not on file  Tobacco Use  . Smoking status: Former Smoker    Packs/day: 0.20    Years: 25.00    Pack years: 5.00    Types: Cigarettes    Quit date: 11/21/1971    Years since quitting: 48.2  . Smokeless tobacco: Never Used  Substance and Sexual Activity  . Alcohol use: Yes    Comment: wine  . Drug use: No  . Sexual activity: Not on file  Other Topics Concern  . Not on file  Social History Narrative  . Not on file   Social Determinants of Health   Financial Resource Strain:   . Difficulty of Paying Living Expenses:   Food Insecurity:   . Worried About Charity fundraiser in the Last Year:   . Arboriculturist in the Last Year:   Transportation Needs:   . Film/video editor (Medical):   Marland Kitchen Lack of Transportation (Non-Medical):   Physical Activity:   . Days of Exercise per Week:   . Minutes of Exercise per Session:   Stress:   . Feeling of Stress :   Social Connections:   . Frequency of Communication with Friends and Family:   . Frequency of Social Gatherings with Friends and Family:   . Attends Religious Services:   . Active Member of Clubs or Organizations:   . Attends Archivist Meetings:   Marland Kitchen Marital Status:       Review of Systems currently denies fever, headache, chest pain, dyspnea, cough,  abdominal pain, nausea, vomiting or visible bleeding.  She does have some occasional back/flank pain.  Vital Signs: BP (!) 153/86   Pulse 82   Temp 97.7 F (36.5 C) (Oral)   Resp 18   Ht _0  (1.651 m)   Wt 115 lb 4.8 oz (52.3 kg)   SpO2 100%   BMI 19.19 kg/m   Physical Exam awake, alert.  Chest clear to auscultation bilaterally.  Heart with regular rate and rhythm.  Abdomen soft, positive bowel sounds, nontender.  No lower extremity edema. Imaging: XR KNEE 3 VIEW LEFT  Result Date: 01/14/2020 Moderate osteoarthritis with mild joint space narrowing and periarticular spurring.   Labs:  CBC: Recent Labs    04/24/19 1109 04/29/19 0609 09/11/19 1458 02/03/20 1448  WBC 5.5 6.7 5.9 5.4  HGB 11.1* 9.0* 11.8* 11.9*  HCT 35.2* 27.4* 35.8* 37.2  PLT  153 104* 179.0 159    COAGS: Recent Labs    04/24/19 1109  INR 1.0  APTT 31    BMP: Recent Labs    04/24/19 1109 04/29/19 0609 09/11/19 1458 02/03/20 1448  NA 141 139 140 143  K 3.8 3.8 4.6 4.5  CL 106 108 103 107  CO2 _0 GLUCOSE 72 110* 65* 100*  BUN _1 CALCIUM 9.7 8.7* 9.8 9.9  CREATININE 1.01* 1.14* 1.12 1.04*  GFRNONAA 52* 45*  --  50*  GFRAA >60 52*  --  58*    LIVER FUNCTION TESTS: Recent Labs    04/24/19 1109 09/11/19 1458  BILITOT 1.2 0.9  AST 20 25  ALT 14 15  ALKPHOS 73 87  PROT 6.2* 6.8  ALBUMIN 4.0 4.4    TUMOR MARKERS: No results for input(s): AFPTM, CEA, CA199, CHROMGRNA in the last 8760 hours.  Assessment and Plan: 84 y.o. female with past medical history significant for right breast cancer, depression, remote DVT, hyperlipidemia, hypertension, hypothyroidism, pacemaker and nephrolithiasis/microhematuria.  She has had prior ureteroscopy and laser lithotripsy in 2002, ureteroscopic management of right renal pelvic stone in 2012 and recent CT revealing stone within the right renal pelvis measuring 1.3 cm with associated right-sided pelvocaliectasis along with 1.2 cm  cystic lesion within the body of the pancreas.  She presents today for right percutaneous nephrostomy/nephroureteral catheter placement prior to planned nephrolithotomy.Risks and benefits of right PCN placement was discussed with the patient including, but not limited to, infection, bleeding, significant bleeding causing loss or decrease in renal function or damage to adjacent structures.   All of the patient's questions were answered, patient is agreeable to proceed.  Consent signed and in chart.      Thank you for this interesting consult.  I greatly enjoyed meeting SCOTTLYNN LINDELL and look forward to participating in their care.  A copy of this report was sent to the requesting provider on this date.  Electronically Signed: D. Rowe Robert, PA-C 02/09/2020, 8:28 AM   I spent a total of 25 minutes   in face to face in clinical consultation, greater than 50% of which was counseling/coordinating care for right percutaneous nephrostomy/nephroureteral catheter placement

## 2020-02-09 NOTE — Anesthesia Procedure Notes (Signed)
Procedure Name: Intubation Date/Time: 02/09/2020 11:17 AM Performed by: Gerald Leitz, CRNA Pre-anesthesia Checklist: Patient identified, Patient being monitored, Timeout performed, Emergency Drugs available and Suction available Patient Re-evaluated:Patient Re-evaluated prior to induction Oxygen Delivery Method: Circle system utilized Preoxygenation: Pre-oxygenation with 100% oxygen Induction Type: IV induction Ventilation: Mask ventilation without difficulty Laryngoscope Size: Mac and 3 Grade View: Grade I Tube type: Oral Tube size: 7.0 mm Number of attempts: 1 Placement Confirmation: ETT inserted through vocal cords under direct vision,  positive ETCO2 and breath sounds checked- equal and bilateral Secured at: 21 cm Tube secured with: Tape Dental Injury: Teeth and Oropharynx as per pre-operative assessment

## 2020-02-09 NOTE — Procedures (Signed)
   Interventional Radiology Procedure:   Indications: Right renal calculus and scheduled for nephrolithotomy procedure in OR  Procedure: Placement of right nephroureteral catheter placement  Findings: Moderated right hydronephrosis.  Placed catheter in upper/mid dilated calyx.  Catheter advanced past the stone and into urinary bladder.   Complications: None     EBL: less than 10 ml  Plan: To OR for stone removal.    Tolulope Pinkett R. Anselm Pancoast, MD  Pager: [phone]

## 2020-02-09 NOTE — Op Note (Signed)
PATIENT:  Danielle [name]  PRE-OPERATIVE DIAGNOSIS: Right renal calculus  POST-OPERATIVE DIAGNOSIS: Same  PROCEDURE: 1. Percutaneous nephrostomy sheath placement. 2.  Right/left percutaneous nephrolithotomy (1.5 cm.) 3.  Antegrade nephrostogram with interpretation 4.  Antegrade double-J stent placement.  SURGEON:  Claybon Jabs  INDICATION: Danielle [name] is a 84 year old female began experiencing gross hematuria in 11/20.  Her urine appeared infected but a urine culture proved negative.  She was not having any flank pain however her work-up revealed a stone within the right renal pelvis.  It was causing right-sided hydronephrosis.  We discussed the options for management and she elected to proceed with PCNL.  She is brought to the operating room today having undergone nephrostomy tube access placement by interventional radiology earlier in the day.  ANESTHESIA:  General  EBL:  Minimal  DRAINS: 6 French, 24 cm double-J stent in the right ureter  LOCAL MEDICATIONS USED:  None  SPECIMEN: Stone given the patient  Description of procedure: After informed consent the patient was taken to the operating room and administered general endotracheal anesthesia. Once fully anesthetized the patient had an 79 French Foley catheter placed It was then moved from the stretcher onto the operating room table in a prone position with bony prominences padded and chest pads in place. The flank with exiting nephrostomy catheter was then sterilely prepped and draped in standard fashion. An official timeout was then performed.  Using the existing nephrostomy catheter access I passed a 0.038 inch floppy tipped guidewire down the ureter into the bladder under fluoroscopy.  This was left in place and the nephrostomy catheter was removed.  A transverse incision was made over the guidewire and a peel-away coaxial catheter was then passed over the guidewire and down the ureter under fluoroscopy.  The inner  portion of the coaxial system was then removed and a second guidewire was passed through this catheter and down the ureter into the bladder under fluoroscopy.  The coaxial catheter was then removed and one of the guidewires was secured to the drape as a safety guidewire and the second guidewire was used as a working guidewire.  The NephroMax nephrostomy dilating balloon was then passed over the working guidewire into the area of the renal pelvis under fluoroscopy.  It was then inflated using dilute contrast under fluoroscopy until the balloon was fully inflated.  I then passed the 28 French nephrostomy access sheath over the balloon into the area of the renal pelvis under fluoroscopy and then deflated the balloon and removed the dilating balloon.  The 18 French rigid nephroscope was then passed under direct vision through the nephrostomy access sheath.  Clotted blood was evacuated however the stone was not immediately visible.  Inspection with the rigid scope was unsuccessful in identifying the stone in the area of the renal pelvis although there did appear to be some irritation of the renal pelvic mucosa consistent with the stone having been located in the pelvis recently.  I therefore removed the rigid nephroscope and performed an antegrade nephrostogram.  An 19 French Foley catheter was passed through the access sheath and into the area of the renal pelvis and then I injected full-strength Omnipaque contrast.  This revealed the stone appeared to be in a parallel middle pole calyx.  I therefore switched to the flexible cystoscope and after a great deal of searching I was able to identify the stone.  I used a large 0 tip nitinol basket to actually engage the stone and was able  to begin to move it out of the parallel calyx.  I was able to pull it into the infundibular region and then switched back to the rigid nephroscope and then was able to identify the stone.  I was able to grasp it with 3 prong graspers  and pulled a piece of the stone which had broken in half out and then was able to manipulate the remaining, larger portion of the stone along the long axis of the stone and was able to extract it through the nephrostomy sheath.  Reinspection revealed a tiny fragment present and this was grasped with 2 prong graspers.  No further fragments could be identified.  There was very minimal bleeding throughout the entire case.  Over the working guidewire I then passed the double-J stent and as I remove the guidewire I noted good curl within the bladder.  Good curl was also achieved in the area of the renal pelvis as the guidewire was completely removed.  I then measured the distance to the renal parenchyma, evacuated fluid from the kidney and then based on that measurement placed FloSeal at that depth and then on through the deep portion of the nephrostomy tract to aid in hemostasis.  I then removed the safety guidewire and nephrostomy sheath.  There was no bleeding at the skin level and therefore the skin was closed with a running, subcuticular 4-0 Monocryl suture.  The incision was covered with dry gauze and a sterile Tegaderm and the patient was awakened and taken to the recovery room in stable and satisfactory condition.  She tolerated the procedure well with no intraoperative complications.   PLAN OF CARE: Discharge to home after an overnight stay.  PATIENT DISPOSITION:  PACU - hemodynamically stable.

## 2020-02-10 ENCOUNTER — Ambulatory Visit (HOSPITAL_COMMUNITY): Payer: Medicare Other

## 2020-02-10 ENCOUNTER — Ambulatory Visit (HOSPITAL_COMMUNITY): Payer: Medicare HMO

## 2020-02-10 DIAGNOSIS — K219 Gastro-esophageal reflux disease without esophagitis: Secondary | ICD-10-CM | POA: Diagnosis not present

## 2020-02-10 DIAGNOSIS — E785 Hyperlipidemia, unspecified: Secondary | ICD-10-CM | POA: Diagnosis not present

## 2020-02-10 DIAGNOSIS — G4700 Insomnia, unspecified: Secondary | ICD-10-CM | POA: Diagnosis not present

## 2020-02-10 DIAGNOSIS — I10 Essential (primary) hypertension: Secondary | ICD-10-CM | POA: Diagnosis not present

## 2020-02-10 DIAGNOSIS — N200 Calculus of kidney: Secondary | ICD-10-CM | POA: Diagnosis not present

## 2020-02-10 DIAGNOSIS — E039 Hypothyroidism, unspecified: Secondary | ICD-10-CM | POA: Diagnosis not present

## 2020-02-10 MED ORDER — TRAMADOL HCL 50 MG PO TABS: 50.0000 mg | ORAL_TABLET | Freq: Four times a day (QID) | ORAL | 0 refills | Status: DC | PRN

## 2020-02-10 NOTE — Discharge Summary (Signed)
Physician Discharge Summary      Patient ID: CORALENE TODD MRN: [id number] DOB/AGE: [birth year] 82 y.o.  Admit date: 02/09/2020 Discharge date: 02/10/2020  Admission Diagnoses: Renal calculus, right [N20.0]  Discharge Diagnoses:  Active Problems:   Renal calculus, right   Discharged Condition: good  Hospital Course: The patient was admitted for an elective right PCNL.  He underwent surgery without complication and without need for a nephrostomy tube.  A double-J stent was left in place.  He did well postoperatively.  She had minimal pain.  Her urine cleared and a Foley catheter was removed.  He is tolerating regular diet.  She has already for discharge at this time.  Discharge Exam: Blood pressure (!) 105/93, pulse 78, temperature 97.7 F (36.5 C), temperature source Oral, resp. rate 17, height 5\' 5"  (1.651 m), weight 53.9 kg, SpO2 94 %. General: Awake, alert and in no apparent distress. Chest: Normal respiratory effort. Cardiovascular: Regular rate and rhythm. Abdomen: Soft, nontender, nondistended. Back:  Dressing in place and dry.  No evidence of ecchymoses or sign of infection.  Disposition: Discharge disposition: 01-Home or Self Care       Discharge Instructions    Discharge patient   Complete by: As directed    Discharge disposition: 01-Home or Self Care   Discharge patient date: 02/10/2020     Allergies as of 02/10/2020      Reactions   Codeine Nausea And Vomiting   Oxycodone Nausea And Vomiting      Medication List    TAKE these medications   atenolol 50 MG tablet Commonly known as: TENORMIN Take 1 tablet (50 mg total) by mouth daily.   atorvastatin 10 MG tablet Commonly known as: LIPITOR TAKE 1 TABLET(10 MG) BY MOUTH DAILY What changed:   how much to take  how to take this  when to take this  additional instructions   azelastine 0.1 % nasal spray Commonly known as: ASTELIN Place 1 spray into both nostrils as needed (Summer Allergies).    calcium-vitamin D 500-200 MG-UNIT tablet Commonly known as: OSCAL WITH D Take 1 tablet by mouth daily with breakfast.   enalapril 10 MG tablet Commonly known as: VASOTEC Take 1 tablet (10 mg total) by mouth daily.   levothyroxine 125 MCG tablet Commonly known as: SYNTHROID Take 1 tablet (125 mcg total) by mouth daily.   traMADol 50 MG tablet Commonly known as: ULTRAM Take 1 tablet (50 mg total) by mouth every 6 (six) hours as needed.      Follow-up Information    [facility] On 02/16/2020.   Why: For your appiontment at 8:30 Contact information: [place]          Signed: Claybon Jabs 02/10/2020, 6:59 AM

## 2020-02-16 DIAGNOSIS — N200 Calculus of kidney: Secondary | ICD-10-CM | POA: Diagnosis not present

## 2020-02-19 ENCOUNTER — Other Ambulatory Visit: Payer: Self-pay

## 2020-02-19 MED ORDER — ENALAPRIL MALEATE 10 MG PO TABS: 10.0000 mg | ORAL_TABLET | Freq: Every day | ORAL | 0 refills | Status: DC

## 2020-02-19 MED ORDER — ATORVASTATIN CALCIUM 10 MG PO TABS: ORAL_TABLET | ORAL | 0 refills | Status: DC

## 2020-03-16 DIAGNOSIS — N200 Calculus of kidney: Secondary | ICD-10-CM | POA: Diagnosis not present

## 2020-03-25 ENCOUNTER — Telehealth: Payer: Self-pay

## 2020-03-25 NOTE — Telephone Encounter (Signed)
Left message for patient to remind of missed remote transmission.  

## 2020-04-30 ENCOUNTER — Telehealth: Payer: Self-pay | Admitting: *Deleted

## 2020-04-30 NOTE — Telephone Encounter (Signed)
Ortho bundle 1 year call attempted. No answer and left VM on patient's phone requesting call back.

## 2020-05-04 ENCOUNTER — Telehealth: Payer: Self-pay | Admitting: *Deleted

## 2020-05-04 LAB — CUP PACEART REMOTE DEVICE CHECK
Battery Impedance: 1759 Ohm
Battery Remaining Longevity: 27 mo
Battery Voltage: 2.74 V
Brady Statistic AP VP Percent: 73 %
Brady Statistic AP VS Percent: 0 %
Brady Statistic AS VP Percent: 27 %
Brady Statistic AS VS Percent: 0 %
Date Time Interrogation Session: 20210615153245
Implantable Lead Implant Date: 19970117
Implantable Lead Implant Date: 19970117
Implantable Lead Location: 753859
Implantable Lead Location: 753860
Implantable Lead Model: 4068
Implantable Lead Model: 4068
Implantable Pulse Generator Implant Date: 20130827
Lead Channel Impedance Value: 755 Ohm
Lead Channel Impedance Value: 824 Ohm
Lead Channel Pacing Threshold Amplitude: 1.375 V
Lead Channel Pacing Threshold Amplitude: 2.5 V
Lead Channel Pacing Threshold Pulse Width: 0.4 ms
Lead Channel Pacing Threshold Pulse Width: 0.4 ms
Lead Channel Setting Pacing Amplitude: 2.5 V
Lead Channel Setting Pacing Amplitude: 3.25 V
Lead Channel Setting Pacing Pulse Width: 0.4 ms
Lead Channel Setting Sensing Sensitivity: 5.6 mV

## 2020-05-04 NOTE — Telephone Encounter (Signed)
Ortho bundle 1 year call completed after 2 separate attempts to reach patient for post-op call. No answer and left VM.

## 2020-05-05 ENCOUNTER — Telehealth: Payer: Self-pay | Admitting: *Deleted

## 2020-05-05 ENCOUNTER — Ambulatory Visit (INDEPENDENT_AMBULATORY_CARE_PROVIDER_SITE_OTHER): Payer: Medicare HMO | Admitting: *Deleted

## 2020-05-05 DIAGNOSIS — I442 Atrioventricular block, complete: Secondary | ICD-10-CM

## 2020-05-05 NOTE — Telephone Encounter (Signed)
Ortho bundle 1 year calls completed as patient called CM back and left VM with update on her status.

## 2020-05-06 NOTE — Progress Notes (Signed)
Remote pacemaker transmission.   

## 2020-06-04 ENCOUNTER — Other Ambulatory Visit: Payer: Self-pay | Admitting: Internal Medicine

## 2020-06-11 ENCOUNTER — Encounter: Payer: Self-pay | Admitting: Internal Medicine

## 2020-06-11 ENCOUNTER — Ambulatory Visit (INDEPENDENT_AMBULATORY_CARE_PROVIDER_SITE_OTHER): Payer: Medicare HMO | Admitting: Internal Medicine

## 2020-06-11 ENCOUNTER — Other Ambulatory Visit: Payer: Self-pay

## 2020-06-11 VITALS — BP 130/70 | HR 57 | Temp 98.6°F | Ht 65.0 in | Wt 116.0 lb

## 2020-06-11 DIAGNOSIS — R7302 Impaired glucose tolerance (oral): Secondary | ICD-10-CM | POA: Diagnosis not present

## 2020-06-11 DIAGNOSIS — E039 Hypothyroidism, unspecified: Secondary | ICD-10-CM | POA: Diagnosis not present

## 2020-06-11 DIAGNOSIS — M1990 Unspecified osteoarthritis, unspecified site: Secondary | ICD-10-CM | POA: Diagnosis not present

## 2020-06-11 DIAGNOSIS — M65339 Trigger finger, unspecified middle finger: Secondary | ICD-10-CM | POA: Diagnosis not present

## 2020-06-11 DIAGNOSIS — E785 Hyperlipidemia, unspecified: Secondary | ICD-10-CM | POA: Diagnosis not present

## 2020-06-11 DIAGNOSIS — I10 Essential (primary) hypertension: Secondary | ICD-10-CM

## 2020-06-11 DIAGNOSIS — Z0001 Encounter for general adult medical examination with abnormal findings: Secondary | ICD-10-CM | POA: Diagnosis not present

## 2020-06-11 HISTORY — DX: Unspecified osteoarthritis, unspecified site: M19.90

## 2020-06-11 MED ORDER — ENALAPRIL MALEATE 10 MG PO TABS: 10.0000 mg | ORAL_TABLET | Freq: Every day | ORAL | 3 refills | Status: DC

## 2020-06-11 MED ORDER — ATENOLOL 50 MG PO TABS: 50.0000 mg | ORAL_TABLET | Freq: Every day | ORAL | 3 refills | Status: DC

## 2020-06-11 MED ORDER — LEVOTHYROXINE SODIUM 125 MCG PO TABS: 125.0000 ug | ORAL_TABLET | Freq: Every day | ORAL | 3 refills | Status: DC

## 2020-06-11 MED ORDER — ATORVASTATIN CALCIUM 10 MG PO TABS: ORAL_TABLET | ORAL | 3 refills | Status: DC

## 2020-06-11 NOTE — Assessment & Plan Note (Signed)
stable overall by history and exam, recent data reviewed with pt, and pt to continue medical treatment as before,  to f/u any worsening symptoms or concerns  

## 2020-06-11 NOTE — Progress Notes (Signed)
Subjective:    Patient ID: LACY SOFIA, female    DOB: 1936-04-13, 84 y.o.   MRN: [id number]  HPI  Here for wellness and f/u;  Overall doing ok;  Pt denies Chest pain, worsening SOB, DOE, wheezing, orthopnea, PND, worsening LE edema, palpitations, dizziness or syncope.  Pt denies neurological change such as new headache, facial or extremity weakness.  Pt denies polydipsia, polyuria, or low sugar symptoms. Pt states overall good compliance with treatment and medications, good tolerability, and has been trying to follow appropriate diet.  Pt denies worsening depressive symptoms, suicidal ideation or panic. No fever, night sweats, wt loss, loss of appetite, or other constitutional symptoms.  Pt states good ability with ADL's, has low fall risk, home safety reviewed and adequate, no other significant changes in hearing or vision, and only occasionally active with exercise. Does have ongoing hand arthritic pain, inaddition to similar right elbow and left knee pain, and also bilat middle trigger fingers worse in the past 2 months  Denies hyper or hypo thyroid symptoms such as voice, skin or hair change. Past Medical History:  Diagnosis Date  . ALLERGIC RHINITIS 01/09/2008  . ANEMIA-NOS 01/09/2008   pt doesn't remember  . ANXIETY 01/09/2008  . ASTHMATIC BRONCHITIS, ACUTE 10/20/2008  . AV BLOCK, COMPLETE 12/16/2009  . Breast cancer, right ([place])   . DEPRESSION 01/09/2008  . Dizziness and giddiness 02/25/2010  . DVT, HX OF 01/09/2008   after delivery of 4 th child  . Genetic testing 06/04/2017   Ms. Burback underwent genetic counseling and testing for hereditary cancer syndromes on 05/24/2017. Her results were negative for mutations in all 46 genes analyzed by [facility]'s 46-gene Common Hereditary Cancers Panel. Genes analyzed include: APC, ATM, AXIN2, BARD1, BMPR1A, BRCA1, BRCA2, BRIP1, CDH1, CDKN2A, CHEK2, CTNNA1, DICER1, EPCAM, GREM1, HOXB13, KIT, MEN1, MLH1, MSH2, MSH3, MSH6, MUTYH, NBN  . GERD 01/09/2008   . GLUCOSE INTOLERANCE 01/09/2008  . History of kidney stones   . HYPERLIPIDEMIA 01/09/2008  . HYPERTENSION 01/09/2008  . HYPOTHYROIDISM 01/09/2008  . Impaired glucose tolerance 07/28/2011  . INSOMNIA-SLEEP DISORDER-UNSPEC 06/15/2009  . NEPHROLITHIASIS, HX OF 01/09/2008  . OSTEOARTHRITIS, CERVICAL SPINE 02/25/2010  . Presence of permanent cardiac pacemaker    medtronic, pacemaker dependent  . Urine incontinence    Past Surgical History:  Procedure Laterality Date  . BREAST BIOPSY Right 09/20/2016   malignant  . ERCP W/ SPHICTEROTOMY     pt denies  . HEMORRHOID SURGERY     pt denies  . IR URETERAL STENT RIGHT NEW ACCESS W/O SEP NEPHROSTOMY CATH  02/09/2020  . MASTECTOMY Right 01/11/2017  . MASTECTOMY W/ SENTINEL NODE BIOPSY Right 01/11/2017   Procedure: RIGHT MASTECTOMY WITH SENTINEL LYMPH NODE BIOPSY;  Surgeon: [name] Klein, MD;  Location: [facility];  Service: General;  Laterality: Right;  . NEPHROLITHOTOMY Right 02/09/2020   Procedure: NEPHROLITHOTOMY PERCUTANEOUS;  Surgeon: Kathie Rhodes, MD;  Location[facility];  Service: Urology;  Laterality: Right;  . PACEMAKER PLACEMENT Left    MedTronic EnPulse E2DR01--06 no heartbeat without pacemaker  . PERMANENT PACEMAKER GENERATOR CHANGE N/A 07/16/2012   Procedure: PERMANENT PACEMAKER GENERATOR CHANGE;  Surgeon: Deboraha Sprang, MD;  [facility];  Service: Cardiovascular;  Laterality: N/A;  . POLYPECTOMY     Uterine  . TOTAL HIP ARTHROPLASTY Right 04/28/2019   Procedure: RIGHT TOTAL HIP ARTHROPLASTY ANTERIOR APPROACH;  Surgeon: Leandrew Koyanagi, MD;  Location: [facility];  Service: Orthopedics;  Laterality: Right;    reports that she quit  smoking about 48 years ago. Her smoking use included cigarettes. She has a 5.00 pack-year smoking history. She has never used smokeless tobacco. She reports current alcohol use. She reports that she does not use drugs. family history includes Breast cancer in her maternal aunt; Breast cancer (age of onset: 40) in her  mother; Lung cancer in her paternal grandfather; Pulmonary embolism in her father; Sudden death in her mother. Allergies  Allergen Reactions  . Codeine Nausea And Vomiting  . Oxycodone Nausea And Vomiting   Current Outpatient Medications on File Prior to Visit  Medication Sig Dispense Refill  . azelastine (ASTELIN) 0.1 % nasal spray Place 1 spray into both nostrils as needed (Summer Allergies).     . calcium-vitamin D (OSCAL WITH D) 500-200 MG-UNIT tablet Take 1 tablet by mouth daily with breakfast.    . traMADol (ULTRAM) 50 MG tablet Take 1 tablet (50 mg total) by mouth every 6 (six) hours as needed. 10 tablet 0   No current facility-administered medications on file prior to visit.   Review of Systems All otherwise neg per pt     Objective:   Physical Exam BP (!) 130/70 (BP Location: Left Arm, Patient Position: Sitting, Cuff Size: Large)   Pulse 57   Temp 98.6 F (37 C) (Oral)   Ht '5\' 5"'$  (1.651 m)   Wt 116 lb (52.6 kg)   SpO2 96%   BMI 19.30 kg/m  VS noted,  Constitutional: Pt appears in NAD HENT: Head: NCAT.  Right Ear: External ear normal.  Left Ear: External ear normal.  Eyes: . Pupils are equal, round, and reactive to light. Conjunctivae and EOM are normal Nose: without d/c or deformity Neck: Neck supple. Gross normal ROM Cardiovascular: Normal rate and regular rhythm.   Pulmonary/Chest: Effort normal and breath sounds without rales or wheezing.  Abd:  Soft, NT, ND, + BS, no organomegaly Degenerative changes to the  Left knee, right elbow and bialteral middle finger trigger fingers Neurological: Pt is alert. At baseline orientation, motor grossly intact Skin: Skin is warm. No rashes, other new lesions, no LE edema Psychiatric: Pt behavior is normal without agitation  All otherwise neg per pt Lab Results  Component Value Date   WBC 5.4 02/03/2020   HGB 11.9 (L) 02/03/2020   HCT 37.2 02/03/2020   PLT 159 02/03/2020   GLUCOSE 100 (H) 02/03/2020   CHOL 194  09/11/2019   TRIG 95.0 09/11/2019   HDL 75.30 09/11/2019   LDLDIRECT 128.0 05/11/2016   LDLCALC 100 (H) 09/11/2019   ALT 15 09/11/2019   AST 25 09/11/2019   NA 143 02/03/2020   K 4.5 02/03/2020   CL 107 02/03/2020   CREATININE 1.04 (H) 02/03/2020   BUN 22 02/03/2020   CO2 27 02/03/2020   TSH 13.41 (H) 09/11/2019   INR 1.0 02/09/2020   HGBA1C 6.1 09/11/2019  Declines further lab today    Assessment & Plan:

## 2020-06-11 NOTE — Patient Instructions (Signed)
Ok to try the OTC Voltaren gel topical treatment for the arthritis in any joint, but including the elbow, knees, and hand joints  You will be contacted regarding the referral for: Hand Surgury - Dr Amedeo Plenty  Please continue all other medications as before, and refills have been done if requested.  Please have the pharmacy call with any other refills you may need.  Please continue your efforts at being more active, low cholesterol diet, and weight control.  You are otherwise up to date with prevention measures today.  Please keep your appointments with your specialists as you may have planned  Please make an Appointment to return in 6 months, or sooner if needed

## 2020-06-11 NOTE — Assessment & Plan Note (Signed)

## 2020-06-11 NOTE — Assessment & Plan Note (Addendum)
Also for hand surgury referral  I spent 31 minutes in addition to time for CPX wellness examination in preparing to see the patient by review of recent labs, imaging and procedures, obtaining and reviewing separately obtained history, communicating with the patient and family or caregiver, ordering medications, tests or procedures, and documenting clinical information in the EHR including the differential Dx, treatment, and any further evaluation and other management of trigger fingers, arthritis, hyperglycmeia, htn, hld, hypothyroidism

## 2020-06-11 NOTE — Assessment & Plan Note (Signed)
For voltaren gel prn

## 2020-06-18 ENCOUNTER — Telehealth: Payer: Self-pay

## 2020-06-18 NOTE — Telephone Encounter (Signed)
[facility] CALL/REPORT: --Caller was stung by a hornet recently on her right hand- 2 days ago. It is swollen. Area is still swollen and hot. No fever. No other s/s  Advised see PCP within 24 hours.  See message below.

## 2020-06-18 NOTE — Telephone Encounter (Signed)
New message   The patient voiced bee sting/swelling.   Offer an appt to see Dr. Jenny Reichmann on Monday, August 2 @ 3:20 pm patient voiced I have to wait that long to be seen, I informed the patient their no more open slots available for today.   Offer to try calling another [place] office to see if they have any opening or Urgent care.   The patient's concern was so I can wait until Monday I gave the patient options again and advise I will transfer over to a triage nurse for assessment.   The patient verbalized understanding the call transfer over spoke with Santiago Glad

## 2020-06-18 NOTE — Telephone Encounter (Signed)
F/u  The patient called back voiced she does not need an appointment the swelling is going down.

## 2020-07-08 ENCOUNTER — Telehealth: Payer: Self-pay | Admitting: Oncology

## 2020-07-08 NOTE — Telephone Encounter (Signed)
Scheduled appt per 8/19 sch msg - unable to reach pt .left message for patient with appt date and time and mailed reminder letter with appt date and time

## 2020-07-14 DIAGNOSIS — M65332 Trigger finger, left middle finger: Secondary | ICD-10-CM | POA: Diagnosis not present

## 2020-07-14 DIAGNOSIS — M79641 Pain in right hand: Secondary | ICD-10-CM | POA: Diagnosis not present

## 2020-07-14 DIAGNOSIS — M79642 Pain in left hand: Secondary | ICD-10-CM | POA: Diagnosis not present

## 2020-07-14 DIAGNOSIS — M6530 Trigger finger, unspecified finger: Secondary | ICD-10-CM | POA: Insufficient documentation

## 2020-07-14 DIAGNOSIS — M65331 Trigger finger, right middle finger: Secondary | ICD-10-CM | POA: Diagnosis not present

## 2020-07-14 HISTORY — DX: Trigger finger, unspecified finger: M65.30

## 2020-07-22 ENCOUNTER — Other Ambulatory Visit: Payer: Medicare Other

## 2020-07-22 ENCOUNTER — Ambulatory Visit: Payer: Medicare Other | Admitting: Oncology

## 2020-08-04 ENCOUNTER — Other Ambulatory Visit: Payer: Self-pay | Admitting: *Deleted

## 2020-08-04 DIAGNOSIS — C50211 Malignant neoplasm of upper-inner quadrant of right female breast: Secondary | ICD-10-CM

## 2020-08-05 ENCOUNTER — Telehealth: Payer: Self-pay | Admitting: Oncology

## 2020-08-05 ENCOUNTER — Other Ambulatory Visit: Payer: Medicare HMO

## 2020-08-05 ENCOUNTER — Ambulatory Visit: Payer: Self-pay | Admitting: Oncology

## 2020-08-05 NOTE — Telephone Encounter (Signed)
Called pt per 9/16 sch msg - left message for patient to call back to reschedule appt.

## 2020-08-19 ENCOUNTER — Telehealth: Payer: Self-pay | Admitting: Adult Health

## 2020-08-19 NOTE — Telephone Encounter (Signed)
Called pt per 9/30 sch smg - no answer./ left message for patient to call back to reschedule

## 2020-08-20 ENCOUNTER — Inpatient Hospital Stay: Payer: Medicare HMO | Attending: Adult Health

## 2020-08-20 ENCOUNTER — Inpatient Hospital Stay: Payer: Medicare HMO | Admitting: Adult Health

## 2020-08-20 DIAGNOSIS — Z87891 Personal history of nicotine dependence: Secondary | ICD-10-CM | POA: Insufficient documentation

## 2020-08-20 DIAGNOSIS — R222 Localized swelling, mass and lump, trunk: Secondary | ICD-10-CM | POA: Insufficient documentation

## 2020-08-20 DIAGNOSIS — C50811 Malignant neoplasm of overlapping sites of right female breast: Secondary | ICD-10-CM | POA: Insufficient documentation

## 2020-08-20 DIAGNOSIS — Z171 Estrogen receptor negative status [ER-]: Secondary | ICD-10-CM | POA: Insufficient documentation

## 2020-08-20 NOTE — Progress Notes (Deleted)
[facility]  [phone] Fax:(336) [phone]     ID: Danielle [name] DOB: 12/14/1935  MR#: [id number]  OJJ#:[id number]  Patient Care Team: Biagio Borg, MD as PCP - Particia Lather, Revonda Standard, MD as PCP - Cardiology (Cardiology) Jovita Kussmaul, MD as Consulting Physician (General Surgery) Magrinat, Virgie Dad, MD as Consulting Physician (Oncology) Ladene Artist, MD as Consulting Physician (Gastroenterology) Deboraha Sprang, MD as Consulting Physician (Cardiology) Kennon Holter, NP as Nurse Practitioner (Nurse Practitioner) Gareth Morgan, MD as Referring Physician (Orthopedic Surgery) Leandrew Koyanagi, MD as Attending Physician (Orthopedic Surgery) OTHER MD:  I connected with Danielle [name] on 08/20/20 at  4:00 PM EDT by telephone visit and verified that I am speaking with the correct person using two identifiers.   I discussed the limitations, risks, security and privacy concerns of performing an evaluation and management service by telemedicine and the availability of in-person appointments. I also discussed with the patient that there may be a patient responsible charge related to this service. The patient expressed understanding and agreed to proceed.   Other persons participating in the visit and their role in the encounter: none  Patient's location: Home Provider's location: Clinic  CHIEF COMPLAINT: Triple negative breast cancer   CURRENT TREATMENT: Observation   BREAST CANCER HISTORY: From the original intake note:  Danielle [name] herself noted a change in her right breast sometime in September 2017. She brought this to medical attention and on 09/19/2016 had diagnostic bilateral mammography with tomography [facility] The breast density was category D. There was an irregular mass in the upper central breast. This was spiculated and measured at least 2 cm. There were  also numerous linear calcifications in the upper outer quadrant of the right breast spanning a minimum of 2.0 cm. On physical exam there was a rounded mobile mass in the upper inner quadrant of the right breast. Ultrasound of the right breast confirmed an irregular hypoechoic mass at the 12:30 o'clock position 5 cm from the nipple measuring 1.5 cm, with a 0.7 cm satellite nodule.  On 09/20/2016 the patient underwent biopsy of the right breast 2. Biopsy #1 described as upper outer quadrant showed high-grade ductal carcinoma in situ estrogen and progesterone receptor negative. Right breast biopsy #2 described as 12:30 o'clock showed invasive ductal carcinoma, estrogen and progesterone receptor negative, HER-2 not amplified with an MIB-1 of 1.24 and the number per cell 1.80 , and the MIB-1  was 80%.  The patient met with my partner Dr. Sonny Dandy January 2018. He recommended Taxotere and Cytoxan chemotherapy for 4 cycles after her surgery.  On 01/11/2017 the patient underwent right mastectomy with sentinel lymph node biopsy. The final pathology here ([id number]) showed in the right breast, invasive ductal carcinoma, grade 3, measuring 2.3 cm with negative margins. All 8 lymph nodes examined, including 3 sentinel lymph nodes, were clear.  The patient declined a follow-up appointment with Dr. Sonny Dandy.She was scheduled to see me 03/12/2017 but did not show.  Her subsequent history is as detailed below   INTERVAL HISTORY: Danielle [name] is here today for f/u of her triple negative breast cancer.  She has undergone right mastectomy and declined chemotherapy.  Her most recent left breast mammogram was completed   REVIEW OF SYSTEMS: Danielle [name]   PAST MEDICAL HISTORY: Past Medical History:  Diagnosis Date  . ALLERGIC RHINITIS 01/09/2008  . ANEMIA-NOS 01/09/2008   pt doesn't remember  .  ANXIETY 01/09/2008  . ASTHMATIC BRONCHITIS, ACUTE 10/20/2008  . AV BLOCK, COMPLETE 12/16/2009  . Breast cancer, right ([place])   . DEPRESSION  01/09/2008  . Dizziness and giddiness 02/25/2010  . DVT, HX OF 01/09/2008   after delivery of 4 th child  . Genetic testing 06/04/2017   Ms. Maiolo underwent genetic counseling and testing for hereditary cancer syndromes on 05/24/2017. Her results were negative for mutations in all 46 genes analyzed by [facility]'s 46-gene Common Hereditary Cancers Panel. Genes analyzed include: APC, ATM, AXIN2, BARD1, BMPR1A, BRCA1, BRCA2, BRIP1, CDH1, CDKN2A, CHEK2, CTNNA1, DICER1, EPCAM, GREM1, HOXB13, KIT, MEN1, MLH1, MSH2, MSH3, MSH6, MUTYH, NBN  . GERD 01/09/2008  . GLUCOSE INTOLERANCE 01/09/2008  . History of kidney stones   . HYPERLIPIDEMIA 01/09/2008  . HYPERTENSION 01/09/2008  . HYPOTHYROIDISM 01/09/2008  . Impaired glucose tolerance 07/28/2011  . INSOMNIA-SLEEP DISORDER-UNSPEC 06/15/2009  . NEPHROLITHIASIS, HX OF 01/09/2008  . OSTEOARTHRITIS, CERVICAL SPINE 02/25/2010  . Presence of permanent cardiac pacemaker    medtronic, pacemaker dependent  . Urine incontinence     PAST SURGICAL HISTORY: Past Surgical History:  Procedure Laterality Date  . BREAST BIOPSY Right 09/20/2016   malignant  . ERCP W/ SPHICTEROTOMY     pt denies  . HEMORRHOID SURGERY     pt denies  . IR URETERAL STENT RIGHT NEW ACCESS W/O SEP NEPHROSTOMY CATH  02/09/2020  . MASTECTOMY Right 01/11/2017  . MASTECTOMY W/ SENTINEL NODE BIOPSY Right 01/11/2017   Procedure: RIGHT MASTECTOMY WITH SENTINEL LYMPH NODE BIOPSY;  Surgeon: [name] Klein, MD;  Location: [facility];  Service: General;  Laterality: Right;  . NEPHROLITHOTOMY Right 02/09/2020   Procedure: NEPHROLITHOTOMY PERCUTANEOUS;  Surgeon: Kathie Rhodes, MD;  [facility];  Service: Urology;  Laterality: Right;  . PACEMAKER PLACEMENT Left    MedTronic EnPulse E2DR01--06 no heartbeat without pacemaker  . PERMANENT PACEMAKER GENERATOR CHANGE N/A 07/16/2012   Procedure: PERMANENT PACEMAKER GENERATOR CHANGE;  Surgeon: Deboraha Sprang, MD;  Location: [facility] C[facility];  Service: Cardiovascular;   Laterality: N/A;  . POLYPECTOMY     Uterine  . TOTAL HIP ARTHROPLASTY Right 04/28/2019   Procedure: RIGHT TOTAL HIP ARTHROPLASTY ANTERIOR APPROACH;  Surgeon: Leandrew Koyanagi, MD;  Location: [facility];  Service: Orthopedics;  Laterality: Right;    FAMILY HISTORY Family History  Problem Relation Age of Onset  . Sudden death Mother        d.99s  . Breast cancer Mother 77  . Pulmonary embolism Father        Possible  . Lung cancer Paternal Grandfather        history of smoking  . Breast cancer Maternal Aunt        d.78s  The patient's father died in his 75s from a "clot to the heart". The patient's mother was diagnosed with breast cancer in her early 22s and died from metastatic breast cancer approximately 30 years later. A maternal aunt (her mother's only sister) was also diagnosed with breast cancer in her early 25s according to the patient. The patient had 2 brothers, 3 sisters. There is no other history of breast or ovarian cancer in the family to her knowledge  GYNECOLOGIC HISTORY:  No LMP recorded. Patient is postmenopausal. She can't remember age at menarche or time of menopause. She is GX P5, including one premature birth. 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 Her husband Jonne Ply") is a Psychologist, sport and exercise. 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 The patient's son Myles Rosenthal lives in [place] where he works as an Arboriculturist. Daughter Clyde Canterbury lives in [place] and is a Scientist, research (life sciences). Daughter [name] Friendly lives in [place] and is an Training and development officer as well as a third Land. Son [name] Klein was born premature and did not survive. Daughter Luellen Pucker lives in [place] where she is a Art gallery manager with a fine arts degree. The patient has 4 grandchildren. She is a Nurse, learning disability.    ADVANCED DIRECTIVES: In place   HEALTH MAINTENANCE: Social History   Tobacco Use  . Smoking status:  Former Smoker    Packs/day: 0.20    Years: 25.00    Pack years: 5.00    Types: Cigarettes    Quit date: 11/21/1971    Years since quitting: 48.7  . Smokeless tobacco: Never Used  Vaping Use  . Vaping Use: Never used  Substance Use Topics  . Alcohol use: Yes    Comment: wine  . Drug use: No     Colonoscopy: Due  PAP:  Bone density:   Allergies  Allergen Reactions  . Codeine Nausea And Vomiting  . Oxycodone Nausea And Vomiting    Current Outpatient Medications  Medication Sig Dispense Refill  . atenolol (TENORMIN) 50 MG tablet Take 1 tablet (50 mg total) by mouth daily. 90 tablet 3  . atorvastatin (LIPITOR) 10 MG tablet TAKE 1 TABLET(10 MG) BY MOUTH DAILY Annual appt due in Oct must see provider for future refills 90 tablet 3  . azelastine (ASTELIN) 0.1 % nasal spray Place 1 spray into both nostrils as needed (Summer Allergies).     . calcium-vitamin D (OSCAL WITH D) 500-200 MG-UNIT tablet Take 1 tablet by mouth daily with breakfast.    . enalapril (VASOTEC) 10 MG tablet Take 1 tablet (10 mg total) by mouth daily. Annual appt due in Oct must see provider for future refills 90 tablet 3  . levothyroxine (SYNTHROID) 125 MCG tablet Take 1 tablet (125 mcg total) by mouth daily. 90 tablet 3  . traMADol (ULTRAM) 50 MG tablet Take 1 tablet (50 mg total) by mouth every 6 (six) hours as needed. 10 tablet 0   No current facility-administered medications for this visit.    OBJECTIVE:  There were no vitals filed for this visit. Wt Readings from Last 3 Encounters:  06/11/20 116 lb (52.6 kg)  02/09/20 118 lb 13.3 oz (53.9 kg)  02/03/20 115 lb 6 oz (52.3 kg)   There is no height or weight on file to calculate BMI.    ECOG FS:1 - Symptomatic but completely ambulatory GENERAL: Patient is a well appearing female in no acute distress HEENT:  Sclerae anicteric.  Oropharynx clear and moist. No ulcerations or evidence of oropharyngeal candidiasis. Neck is supple.  NODES:  No cervical,  supraclavicular, or axillary lymphadenopathy palpated.  BREAST EXAM:  Deferred. LUNGS:  Clear to auscultation bilaterally.  No wheezes or rhonchi. HEART:  Regular rate and rhythm. No murmur appreciated. ABDOMEN:  Soft, nontender.  Positive, normoactive bowel sounds. No organomegaly palpated. MSK:  No focal spinal tenderness to palpation. Full range of motion bilaterally in the upper extremities. EXTREMITIES:  No peripheral edema.   SKIN:  Clear with no obvious rashes or skin changes. No nail dyscrasia. NEURO:  Nonfocal. Well oriented.  Appropriate affect.     LAB RESULTS:  CMP     Component Value Date/Time  NA 143 02/03/2020 1448   NA 140 07/25/2017 1347   K 4.5 02/03/2020 1448   K 4.3 07/25/2017 1347   CL 107 02/03/2020 1448   CO2 27 02/03/2020 1448   CO2 26 07/25/2017 1347   GLUCOSE 100 (H) 02/03/2020 1448   GLUCOSE 77 07/25/2017 1347   BUN 22 02/03/2020 1448   BUN 27.4 (H) 07/25/2017 1347   CREATININE 1.04 (H) 02/03/2020 1448   CREATININE 1.1 07/25/2017 1347   CALCIUM 9.9 02/03/2020 1448   CALCIUM 9.8 07/25/2017 1347   PROT 6.8 09/11/2019 1458   PROT 6.6 07/25/2017 1347   ALBUMIN 4.4 09/11/2019 1458   ALBUMIN 4.0 07/25/2017 1347   AST 25 09/11/2019 1458   AST 29 07/25/2017 1347   ALT 15 09/11/2019 1458   ALT 28 07/25/2017 1347   ALKPHOS 87 09/11/2019 1458   ALKPHOS 75 07/25/2017 1347   BILITOT 0.9 09/11/2019 1458   BILITOT 1.21 (H) 07/25/2017 1347   GFRNONAA 50 (L) 02/03/2020 1448   GFRNONAA 57 (L) 07/15/2012 1714   GFRAA 58 (L) 02/03/2020 1448   GFRAA 66 07/15/2012 1714    No results found for: TOTALPROTELP, ALBUMINELP, A1GS, A2GS, BETS, BETA2SER, GAMS, MSPIKE, SPEI  No results found for: Ron Parker, [facility]  Lab Results  Component Value Date   WBC 5.4 02/03/2020   NEUTROABS 3.4 09/11/2019   HGB 11.9 (L) 02/03/2020   HCT 37.2 02/03/2020   MCV 93.5 02/03/2020   PLT 159 02/03/2020      Chemistry      Component Value Date/Time    NA 143 02/03/2020 1448   NA 140 07/25/2017 1347   K 4.5 02/03/2020 1448   K 4.3 07/25/2017 1347   CL 107 02/03/2020 1448   CO2 27 02/03/2020 1448   CO2 26 07/25/2017 1347   BUN 22 02/03/2020 1448   BUN 27.4 (H) 07/25/2017 1347   CREATININE 1.04 (H) 02/03/2020 1448   CREATININE 1.1 07/25/2017 1347      Component Value Date/Time   CALCIUM 9.9 02/03/2020 1448   CALCIUM 9.8 07/25/2017 1347   ALKPHOS 87 09/11/2019 1458   ALKPHOS 75 07/25/2017 1347   AST 25 09/11/2019 1458   AST 29 07/25/2017 1347   ALT 15 09/11/2019 1458   ALT 28 07/25/2017 1347   BILITOT 0.9 09/11/2019 1458   BILITOT 1.21 (H) 07/25/2017 1347       No results found for: LABCA2  No components found for: SATFEV311  No results for input(s): INR in the last 168 hours.  Urinalysis    Component Value Date/Time   COLORURINE YELLOW 10/09/2019 1523   APPEARANCEUR Cloudy (A) 10/09/2019 1523   LABSPEC >=1.030 (A) 10/09/2019 1523   PHURINE 6.0 10/09/2019 1523   GLUCOSEU NEGATIVE 10/09/2019 1523   HGBUR LARGE (A) 10/09/2019 1523   BILIRUBINUR NEGATIVE 10/09/2019 1523   KETONESUR NEGATIVE 10/09/2019 1523   PROTEINUR NEGATIVE 08/19/2018 1815   UROBILINOGEN 0.2 10/09/2019 1523   NITRITE NEGATIVE 10/09/2019 1523   LEUKOCYTESUR TRACE (A) 10/09/2019 1523     STUDIES: No results found.  ELIGIBLE FOR AVAILABLE RESEARCH PROTOCOL: no  ASSESSMENT: 84 y.o. [place] woman woman status post right breast overlapping site biopsies 09/20/2016 showing (a) invasive ductal carcinoma, estrogen and progesterone receptor negative, with an MIB-1 of 80%, and no HER-2 amplification. (b) ductal carcinoma in situ, high-grade, estrogen and progesterone receptor negative  (1) status post right mastectomy with sentinel lymph node and axillary lymph node sampling 01/11/2017 for a pT2 pN0, stage IIB invasive  ductal carcinoma, grade 3, with negative margins  (2) genetics testing 05/24/2017 through [facility]'s Common Hereditary Cancers  Panel found no deleterious mutations in APC, ATM, AXIN2, BARD1, BMPR1A, BRCA1, BRCA2, BRIP1, CDH1, CDKN2A, CHEK2, CTNNA1, DICER1, EPCAM, GREM1, HOXB13, KIT, MEN1, MLH1, MSH2, MSH3, MSH6, MUTYH, NBN, NF1, NTHL1, PALB2, PDGFRA, PMS2, POLD1, POLE, PTEN, RAD50, RAD51C, RAD51D, SDHA, SDHB, SDHC, SDHD, SMAD4, SMARCA4, STK11, TP53, TSC1, TSC2, and VHL.  (3) the patient opted against adjuvant chemotherapy  PLAN: Danielle [name] is  Total encounter time: *** minutes   Wilber Bihari, NP 08/20/20 8:56 AM Medical Oncology and Hematology [facility] [place], [place] [address] Tel. [phone]    Fax. [phone]  *Total Encounter Time as defined by the Centers for Medicare and Medicaid Services includes, in addition to the face-to-face time of a patient visit (documented in the note above) non-face-to-face time: obtaining and reviewing outside history, ordering and reviewing medications, tests or procedures, care coordination (communications with other health care professionals or caregivers) and documentation in the medical record.

## 2020-08-20 NOTE — Progress Notes (Signed)
Attempted to call pt about no show for appt with Wilber Bihari, NP. Pt did not answer. LVM to return call to r/s. Letter also sent to pt.

## 2020-09-04 ENCOUNTER — Ambulatory Visit: Payer: Medicare HMO | Attending: Internal Medicine

## 2020-09-04 DIAGNOSIS — Z23 Encounter for immunization: Secondary | ICD-10-CM

## 2020-09-04 NOTE — Progress Notes (Signed)
   Covid-19 Vaccination Clinic  Name:  MILO SOLANA    MRN: [id number] DOB: [birth year]  09/04/2020  Ms. Bascomb was observed post Covid-19 immunization for 15 minutes without incident. She was provided with Vaccine Information Sheet and instruction to access the V-Safe system.   Ms. Mccabe was instructed to call 911 with any severe reactions post vaccine: Marland Kitchen Difficulty breathing  . Swelling of face and throat  . A fast heartbeat  . A bad rash all over body  . Dizziness and weakness

## 2020-09-10 ENCOUNTER — Inpatient Hospital Stay: Payer: Medicare HMO | Admitting: Medical

## 2020-09-10 ENCOUNTER — Inpatient Hospital Stay: Payer: Medicare HMO

## 2020-09-10 ENCOUNTER — Other Ambulatory Visit: Payer: Self-pay

## 2020-09-10 ENCOUNTER — Ambulatory Visit: Payer: Medicare HMO | Admitting: Adult Health

## 2020-09-10 VITALS — BP 146/78 | HR 82 | Temp 98.0°F | Resp 16 | Ht 65.0 in | Wt 111.3 lb

## 2020-09-10 DIAGNOSIS — Z171 Estrogen receptor negative status [ER-]: Secondary | ICD-10-CM | POA: Diagnosis not present

## 2020-09-10 DIAGNOSIS — Z87891 Personal history of nicotine dependence: Secondary | ICD-10-CM | POA: Diagnosis not present

## 2020-09-10 DIAGNOSIS — R222 Localized swelling, mass and lump, trunk: Secondary | ICD-10-CM | POA: Diagnosis not present

## 2020-09-10 DIAGNOSIS — C50811 Malignant neoplasm of overlapping sites of right female breast: Secondary | ICD-10-CM

## 2020-09-10 DIAGNOSIS — D4701 Cutaneous mastocytosis: Secondary | ICD-10-CM

## 2020-09-10 DIAGNOSIS — C50211 Malignant neoplasm of upper-inner quadrant of right female breast: Secondary | ICD-10-CM

## 2020-09-10 LAB — CMP (CANCER CENTER ONLY)
ALT: 9 U/L (ref 0–44)
AST: 20 U/L (ref 15–41)
Albumin: 4.3 g/dL (ref 3.5–5.0)
Alkaline Phosphatase: 83 U/L (ref 38–126)
Anion gap: 6 (ref 5–15)
BUN: 19 mg/dL (ref 8–23)
CO2: 30 mmol/L (ref 22–32)
Calcium: 10.1 mg/dL (ref 8.9–10.3)
Chloride: 105 mmol/L (ref 98–111)
Creatinine: 1.11 mg/dL — ABNORMAL HIGH (ref 0.44–1.00)
GFR, Estimated: 50 mL/min — ABNORMAL LOW (ref >60)
Glucose, Bld: 69 mg/dL — ABNORMAL LOW (ref 70–99)
Potassium: 4.6 mmol/L (ref 3.5–5.1)
Sodium: 141 mmol/L (ref 135–145)
Total Bilirubin: 1.2 mg/dL (ref 0.3–1.2)
Total Protein: 6.9 g/dL (ref 6.5–8.1)

## 2020-09-10 LAB — CBC WITH DIFFERENTIAL (CANCER CENTER ONLY)
Abs Immature Granulocytes: 0.01 10*3/uL (ref 0.00–0.07)
Basophils Absolute: 0.1 10*3/uL (ref 0.0–0.1)
Basophils Relative: 1 %
Eosinophils Absolute: 0.1 10*3/uL (ref 0.0–0.5)
Eosinophils Relative: 2 %
HCT: 37.4 % (ref 36.0–46.0)
Hemoglobin: 12.1 g/dL (ref 12.0–15.0)
Immature Granulocytes: 0 %
Lymphocytes Relative: 31 %
Lymphs Abs: 1.6 10*3/uL (ref 0.7–4.0)
MCH: 29.9 pg (ref 26.0–34.0)
MCHC: 32.4 g/dL (ref 30.0–36.0)
MCV: 92.3 fL (ref 80.0–100.0)
Monocytes Absolute: 0.6 10*3/uL (ref 0.1–1.0)
Monocytes Relative: 11 %
Neutro Abs: 2.8 10*3/uL (ref 1.7–7.7)
Neutrophils Relative %: 55 %
Platelet Count: 150 10*3/uL (ref 150–400)
RBC: 4.05 MIL/uL (ref 3.87–5.11)
RDW: 13.3 % (ref 11.5–15.5)
WBC Count: 5.1 10*3/uL (ref 4.0–10.5)
nRBC: 0 % (ref 0.0–0.2)

## 2020-09-14 NOTE — Progress Notes (Signed)
Sympt[facility] Management [facility] Progress Note   Danielle [name] [id number] 25-Mar-1936 84 y.o.  Danielle [name] is managed by Dr. Lurline Del  Actively treated with chemotherapy/immunotherapy/hormonal therapy: no  Next scheduled appointment with provider: 09/08/2021  Assessment: Plan:    Nodule of chest wall - Plan: Ambulatory referral to General Surgery  Malignant neoplasm of overlapping sites of right breast in female, estrogen receptor negative ([place])   ER negative malignant neoplasm of the right breast: The patient continues to be managed by Dr. Jana Hakim and is scheduled to be seen for conservative follow-up on 09/08/2021.  Danielle [name] has been referred for a routine left unilateral mammogram.  Nodule of the right chest wall: The patient was referred to Dr. Barry Dienes for evaluation of this area.  Please see After Visit Summary for patient specific instructions.  Future Appointments  Date Time Provider Candelaria Arenas  11/03/2020  7:45 AM [facility] DEVICE REMOTES [facility]  09/08/2021  2:30 PM [facility] None  09/08/2021  3:00 PM Magrinat, Virgie Dad, MD [facility] None    Orders Placed This Encounter  Procedures  . Ambulatory referral to General Surgery       Subjective:   Patient ID:  Danielle [name] is a 84 y.o. (DOB Apr 17, 1936) female.  Chief Complaint: No chief complaint on file.   HPI Danielle [name]  is a 84 y.o. female with a diagnosis of an ER negative malignant neoplasm of the right breast.  Danielle [name] continues to be followed conservatively by Dr. Jana Hakim and was last seen on 08/10/2019.  She reports noting a nodule in her right chest wall approximately 2 weeks ago.  The nodule is superior to her right mastectomy scar.  She reports that the area was initially nontender but is tender at this point and is mobile.  She request to be seen by Dr. Barry Dienes for evaluation of this area given that she was her surgeon  previously.  Her last left unilateral mammogram was completed on 09/26/2019 and was negative with plans for repeat study in 1 year.  The patient denies any other issues of concern today.   Medications: I have reviewed the patient's current medications.  Allergies:  Allergies  Allergen Reactions  . Codeine Nausea And Vomiting  . Oxycodone Nausea And Vomiting    Past Medical History:  Diagnosis Date  . ALLERGIC RHINITIS 01/09/2008  . ANEMIA-NOS 01/09/2008   pt doesn't remember  . ANXIETY 01/09/2008  . ASTHMATIC BRONCHITIS, ACUTE 10/20/2008  . AV BLOCK, COMPLETE 12/16/2009  . Breast cancer, right ([place])   . DEPRESSION 01/09/2008  . Dizziness and giddiness 02/25/2010  . DVT, HX OF 01/09/2008   after delivery of 4 th child  . Genetic testing 06/04/2017   Ms. Linden underwent genetic counseling and testing for hereditary cancer syndromes on 05/24/2017. Her results were negative for mutations in all 46 genes analyzed by [employer]'s 46-gene Common Hereditary Cancers Panel. Genes analyzed include: APC, ATM, AXIN2, BARD1, BMPR1A, BRCA1, BRCA2, BRIP1, CDH1, CDKN2A, CHEK2, CTNNA1, DICER1, EPCAM, GREM1, HOXB13, KIT, MEN1, MLH1, MSH2, MSH3, MSH6, MUTYH, NBN  . GERD 01/09/2008  . GLUCOSE INTOLERANCE 01/09/2008  . History of kidney stones   . HYPERLIPIDEMIA 01/09/2008  . HYPERTENSION 01/09/2008  . HYPOTHYROIDISM 01/09/2008  . Impaired glucose tolerance 07/28/2011  . INSOMNIA-SLEEP DISORDER-UNSPEC 06/15/2009  . NEPHROLITHIASIS, HX OF 01/09/2008  . OSTEOARTHRITIS, CERVICAL SPINE 02/25/2010  . Presence of permanent cardiac pacemaker    medtronic, pacemaker dependent  . Urine incontinence  Past Surgical History:  Procedure Laterality Date  . BREAST BIOPSY Right 09/20/2016   malignant  . ERCP W/ SPHICTEROTOMY     pt denies  . HEMORRHOID SURGERY     pt denies  . IR URETERAL STENT RIGHT NEW ACCESS W/O SEP NEPHROSTOMY CATH  02/09/2020  . MASTECTOMY Right 01/11/2017  . MASTECTOMY W/ SENTINEL NODE BIOPSY Right  01/11/2017   Procedure: RIGHT MASTECTOMY WITH SENTINEL LYMPH NODE BIOPSY;  Surgeon: [name] Klein, MD;  Location: [facility];  Service: General;  Laterality: Right;  . NEPHROLITHOTOMY Right 02/09/2020   Procedure: NEPHROLITHOTOMY PERCUTANEOUS;  Surgeon: Kathie Rhodes, MD;  Location: [facility];  Service: Urology;  Laterality: Right;  . PACEMAKER PLACEMENT Left    MedTronic EnPulse [id number] no heartbeat without pacemaker  . PERMANENT PACEMAKER GENERATOR CHANGE N/A 07/16/2012   Procedure: PERMANENT PACEMAKER GENERATOR CHANGE;  Surgeon: Deboraha Sprang, MD;  Location: [facility] CATH LAB;  Service: Cardiovascular;  Laterality: N/A;  . POLYPECTOMY     Uterine  . TOTAL HIP ARTHROPLASTY Right 04/28/2019   Procedure: RIGHT TOTAL HIP ARTHROPLASTY ANTERIOR APPROACH;  Surgeon: Leandrew Koyanagi, MD;  Location: [facility];  Service: Orthopedics;  Laterality: Right;    Family History  Problem Relation Age of Onset  . Sudden death Mother        d.20s  . Breast cancer Mother 95  . Pulmonary embolism Father        Possible  . Lung cancer Paternal Grandfather        history of smoking  . Breast cancer Maternal Aunt        d.78s    Social History   Socioeconomic History  . Marital status: Married    Spouse name: Not on file  . Number of children: Not on file  . Years of education: Not on file  . Highest education level: Not on file  Occupational History  . Not on file  Tobacco Use  . Smoking status: Former Smoker    Packs/day: 0.20    Years: 25.00    Pack years: 5.00    Types: Cigarettes    Quit date: 11/21/1971    Years since quitting: 48.8  . Smokeless tobacco: Never Used  Vaping Use  . Vaping Use: Never used  Substance and Sexual Activity  . Alcohol use: Yes    Comment: wine  . Drug use: No  . Sexual activity: Not on file  Other Topics Concern  . Not on file  Social History Narrative  . Not on file   Social Determinants of Health   Financial Resource Strain:   . Difficulty of Paying Living Expenses:  Not on file  Food Insecurity:   . Worried About Charity fundraiser in the Last Year: Not on file  . Ran Out of Food in the Last Year: Not on file  Transportation Needs:   . Lack of Transportation (Medical): Not on file  . Lack of Transportation (Non-Medical): Not on file  Physical Activity:   . Days of Exercise per Week: Not on file  . Minutes of Exercise per Session: Not on file  Stress:   . Feeling of Stress : Not on file  Social Connections:   . Frequency of Communication with Friends and Family: Not on file  . Frequency of Social Gatherings with Friends and Family: Not on file  . Attends Religious Services: Not on file  . Active Member of Clubs or Organizations: Not on file  . Attends Archivist  Meetings: Not on file  . Marital Status: Not on file  Intimate Partner Violence:   . Fear of Current or Ex-Partner: Not on file  . Emotionally Abused: Not on file  . Physically Abused: Not on file  . Sexually Abused: Not on file    Past Medical History, Surgical history, Social history, and Family history were reviewed and updated as appropriate.   Please see review of systems for further details on the patient's review from today.   Review of Systems:  Review of Systems  Constitutional: Negative for activity change, chills, diaphoresis and fever.  HENT: Negative for trouble swallowing and voice change.   Respiratory: Negative for cough, chest tightness, shortness of breath and wheezing.   Cardiovascular: Negative for chest pain and palpitations.  Gastrointestinal: Negative for abdominal pain, constipation, diarrhea, nausea and vomiting.  Musculoskeletal: Negative for back pain and myalgias.  Skin: Negative for color change, rash and wound.        She reports noting a nodule in her right chest wall approximately 2 weeks ago.  The nodule is superior to her right mastectomy scar.  She reports that the area was initially nontender but is tender at this point and is mobile.    Neurological: Negative for dizziness, light-headedness and headaches.    Objective:   Physical Exam:  BP (!) 146/78 (BP Location: Left Arm, Patient Position: Sitting)   Pulse 82   Temp 98 F (36.7 C) (Tympanic)   Resp 16   Ht _0  (1.651 m)   Wt 111 lb 4.8 oz (50.5 kg)   SpO2 100%   BMI 18.52 kg/m  ECOG: 0  Physical Exam Constitutional:      General: She is not in acute distress.    Appearance: She is not diaphoretic.  HENT:     Head: Normocephalic and atraumatic.  Eyes:     General: No scleral icterus.       Right eye: No discharge.        Left eye: No discharge.     Conjunctiva/sclera: Conjunctivae normal.  Cardiovascular:     Rate and Rhythm: Normal rate and regular rhythm.     Heart sounds: Normal heart sounds. No murmur heard.  No friction rub. No gallop.   Pulmonary:     Effort: Pulmonary effort is normal. No respiratory distress.     Breath sounds: Normal breath sounds. No stridor. No wheezing or rales.  Abdominal:     General: Bowel sounds are normal. There is no distension.     Palpations: There is no mass.     Tenderness: There is no abdominal tenderness. There is no guarding.  Musculoskeletal:     Cervical back: Normal range of motion and neck supple.     Right lower leg: No edema.     Left lower leg: No edema.  Lymphadenopathy:     Head:     Right side of head: No submandibular, tonsillar, preauricular, posterior auricular or occipital adenopathy.     Left side of head: No submandibular, tonsillar, preauricular, posterior auricular or occipital adenopathy.     Cervical: No cervical adenopathy.     Upper Body:     Right upper body: No supraclavicular or epitrochlear adenopathy.     Left upper body: No supraclavicular or epitrochlear adenopathy.  Skin:    General: Skin is warm and dry.     Findings: No erythema or rash.  Neurological:     Mental Status: She is alert.  Coordination: Coordination normal.     Gait: Gait normal.  Psychiatric:         Behavior: Behavior normal.        Thought Content: Thought content normal.        Judgment: Judgment normal.      Breasts: Right chest wall shows a well-healed mastectomy scar with a nodular density 2.5 cm above the scar and 4 cm lateral to the medial edge of the scar. Left breast appear normal, no suspicious masses, no skin or nipple changes or left axillary nodes.  Lab Review:     Component Value Date/Time   NA 141 09/10/2020 1337   NA 140 07/25/2017 1347   K 4.6 09/10/2020 1337   K 4.3 07/25/2017 1347   CL 105 09/10/2020 1337   CO2 30 09/10/2020 1337   CO2 26 07/25/2017 1347   GLUCOSE 69 (L) 09/10/2020 1337   GLUCOSE 77 07/25/2017 1347   BUN 19 09/10/2020 1337   BUN 27.4 (H) 07/25/2017 1347   CREATININE 1.11 (H) 09/10/2020 1337   CREATININE 1.1 07/25/2017 1347   CALCIUM 10.1 09/10/2020 1337   CALCIUM 9.8 07/25/2017 1347   PROT 6.9 09/10/2020 1337   PROT 6.6 07/25/2017 1347   ALBUMIN 4.3 09/10/2020 1337   ALBUMIN 4.0 07/25/2017 1347   AST 20 09/10/2020 1337   AST 29 07/25/2017 1347   ALT 9 09/10/2020 1337   ALT 28 07/25/2017 1347   ALKPHOS 83 09/10/2020 1337   ALKPHOS 75 07/25/2017 1347   BILITOT 1.2 09/10/2020 1337   BILITOT 1.21 (H) 07/25/2017 1347   GFRNONAA 50 (L) 09/10/2020 1337   GFRNONAA 57 (L) 07/15/2012 1714   GFRAA 58 (L) 02/03/2020 1448   GFRAA 66 07/15/2012 1714       Component Value Date/Time   WBC 5.1 09/10/2020 1337   WBC 5.4 02/03/2020 1448   RBC 4.05 09/10/2020 1337   HGB 12.1 09/10/2020 1337   HGB 11.1 (L) 07/25/2017 1347   HCT 37.4 09/10/2020 1337   HCT 34.1 (L) 07/25/2017 1347   PLT 150 09/10/2020 1337   PLT 160 07/25/2017 1347   MCV 92.3 09/10/2020 1337   MCV 96.3 07/25/2017 1347   MCH 29.9 09/10/2020 1337   MCHC 32.4 09/10/2020 1337   RDW 13.3 09/10/2020 1337   RDW 14.1 07/25/2017 1347   LYMPHSABS 1.6 09/10/2020 1337   LYMPHSABS 1.9 07/25/2017 1347   MONOABS 0.6 09/10/2020 1337   MONOABS 0.6 07/25/2017 1347   EOSABS 0.1  09/10/2020 1337   EOSABS 0.2 07/25/2017 1347   BASOSABS 0.1 09/10/2020 1337   BASOSABS 0.0 07/25/2017 1347   -------------------------------  Imaging from last 24 hours (if applicable):  Radiology interpretation: No results found.

## 2020-09-24 ENCOUNTER — Telehealth: Payer: Self-pay | Admitting: Internal Medicine

## 2020-09-24 NOTE — Telephone Encounter (Signed)
Danielle [name] wants to know if you would be willing to take on her husband as a new patient, Danielle [name] DOB: 2.16.1936  Husband currently a patient at [facility], they would prefer to come to one location   Please contact Mrs. [name] and advise.  Thank you.

## 2020-09-24 NOTE — Telephone Encounter (Signed)
Sent to Dr. John to advise. 

## 2020-09-25 NOTE — Telephone Encounter (Signed)
Ok with me 

## 2020-09-27 NOTE — Telephone Encounter (Signed)
Called pt and LVM to call back a schedule new patient appt per Dr Alain Marion

## 2020-11-29 DIAGNOSIS — Z853 Personal history of malignant neoplasm of breast: Secondary | ICD-10-CM | POA: Diagnosis not present

## 2020-12-13 ENCOUNTER — Other Ambulatory Visit: Payer: Self-pay | Admitting: General Surgery

## 2020-12-13 DIAGNOSIS — C7981 Secondary malignant neoplasm of breast: Secondary | ICD-10-CM | POA: Diagnosis not present

## 2020-12-13 DIAGNOSIS — Z853 Personal history of malignant neoplasm of breast: Secondary | ICD-10-CM | POA: Diagnosis not present

## 2020-12-13 DIAGNOSIS — D492 Neoplasm of unspecified behavior of bone, soft tissue, and skin: Secondary | ICD-10-CM | POA: Diagnosis not present

## 2020-12-13 DIAGNOSIS — C50911 Malignant neoplasm of unspecified site of right female breast: Secondary | ICD-10-CM | POA: Diagnosis not present

## 2020-12-17 IMAGING — XA IR URETURAL STENT RIGHT NEW ACCESS W/O SEP NEPHROSTOMY CATH
3 series · 4 of 4 positions shown · non-contrast
Comparison: None.

INDICATION: 84-year-old with large stone in the right renal pelvis and scheduled
for percutaneous nephrolithotomy procedure. Patient needs
percutaneous access for the procedure.

EXAM:
PLACEMENT OF RIGHT NEPHROURETERAL CATHETER WITH ULTRASOUND AND
FLUOROSCOPIC GUIDANCE

[Series 1: care single · 1 of 1 slices shown (1 of 2)]
[im 1/1]
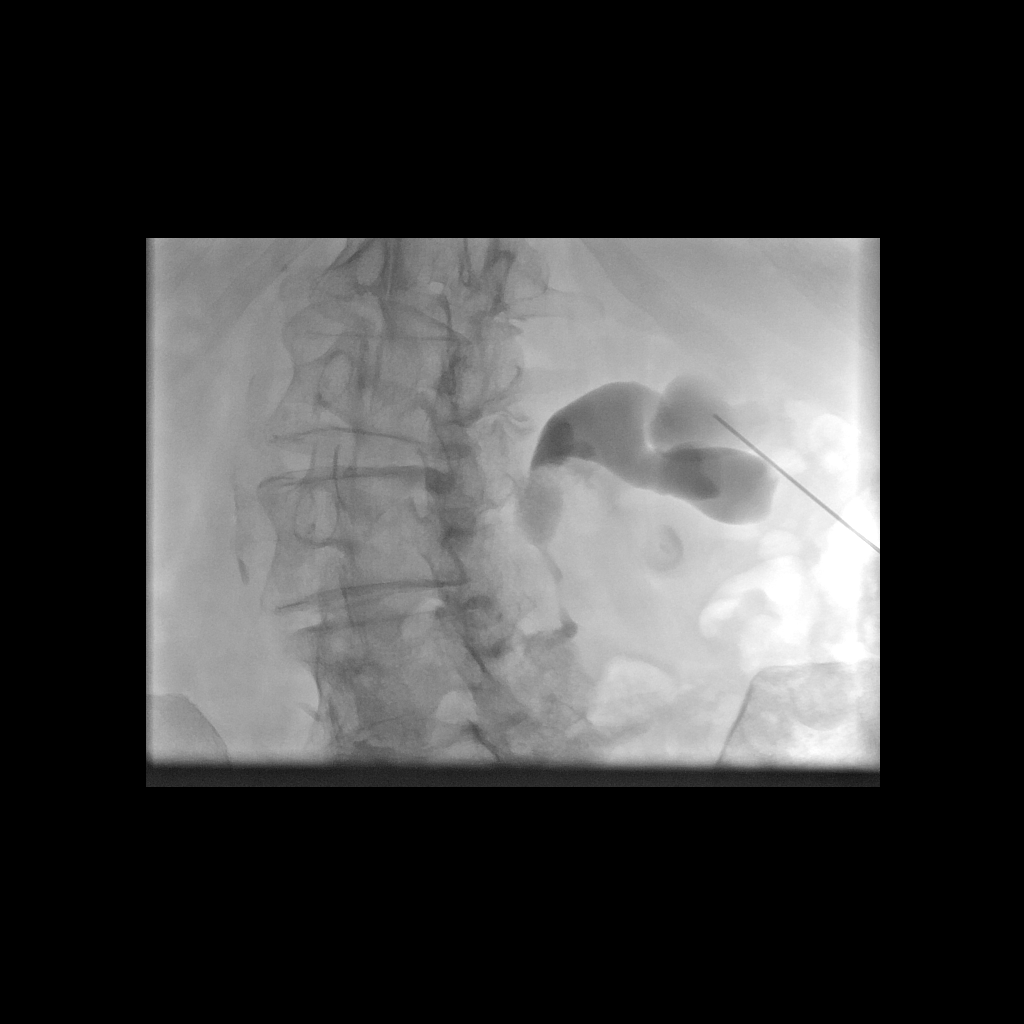

[Series 2: care single · 1 of 1 slices shown (2 of 2)]
[im 1/1]
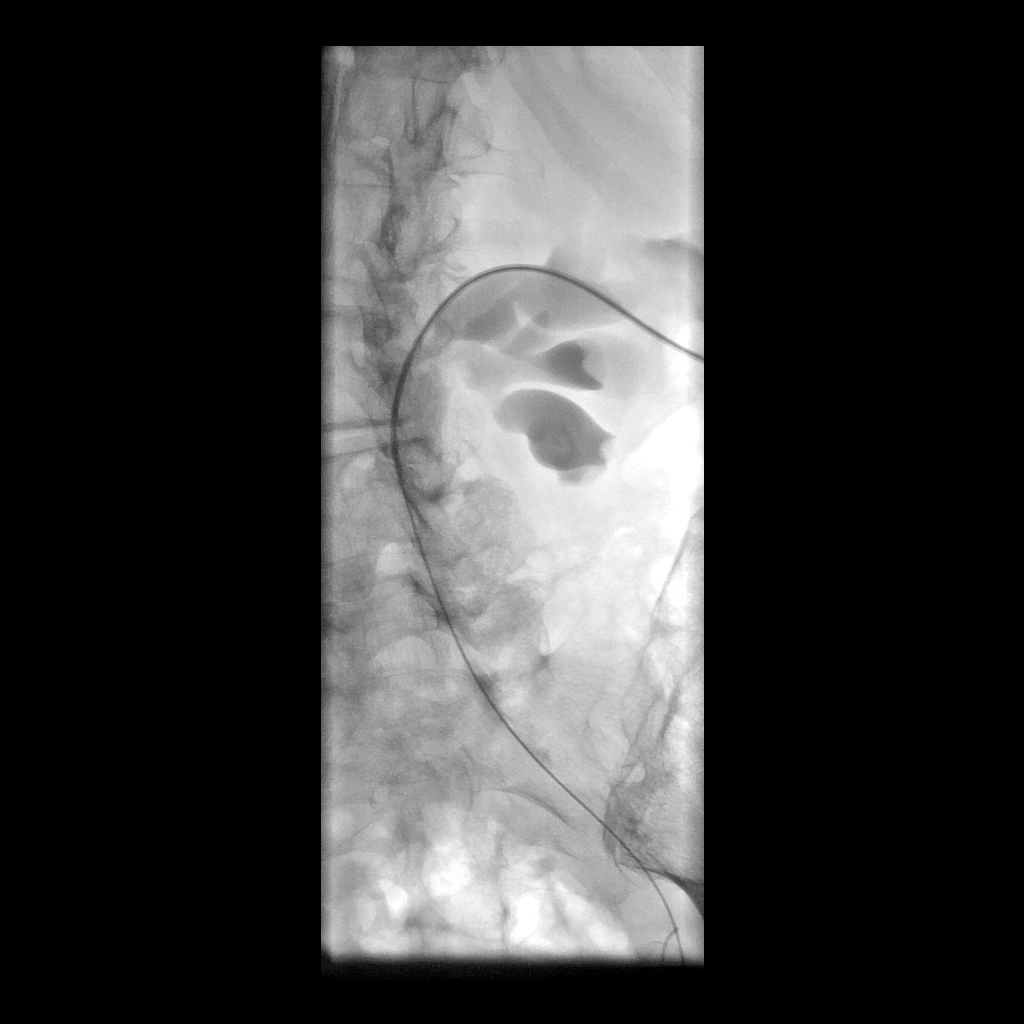

[Series 300: ir ureteral stent right new access w/o s · non-contrast · 2 of 2 slices shown]
[im 1/2]
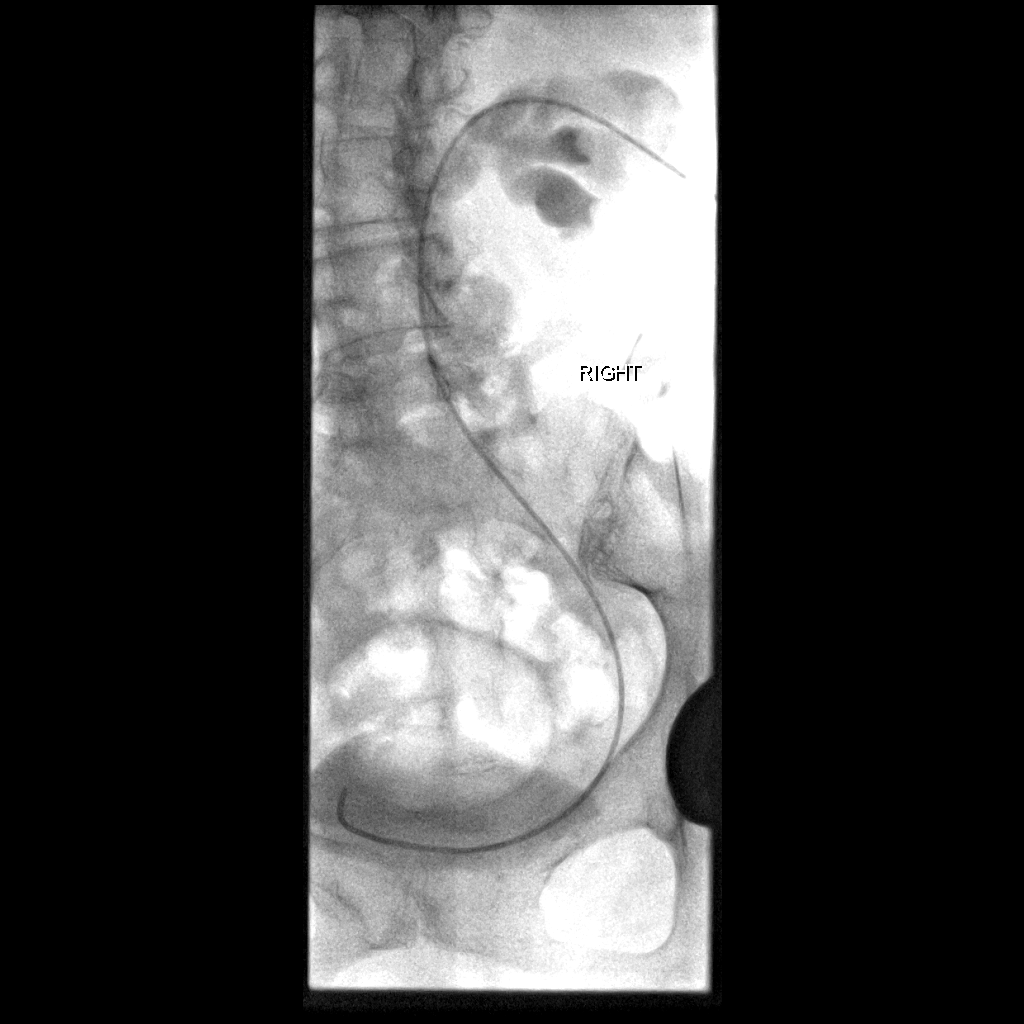
[im 2/2]
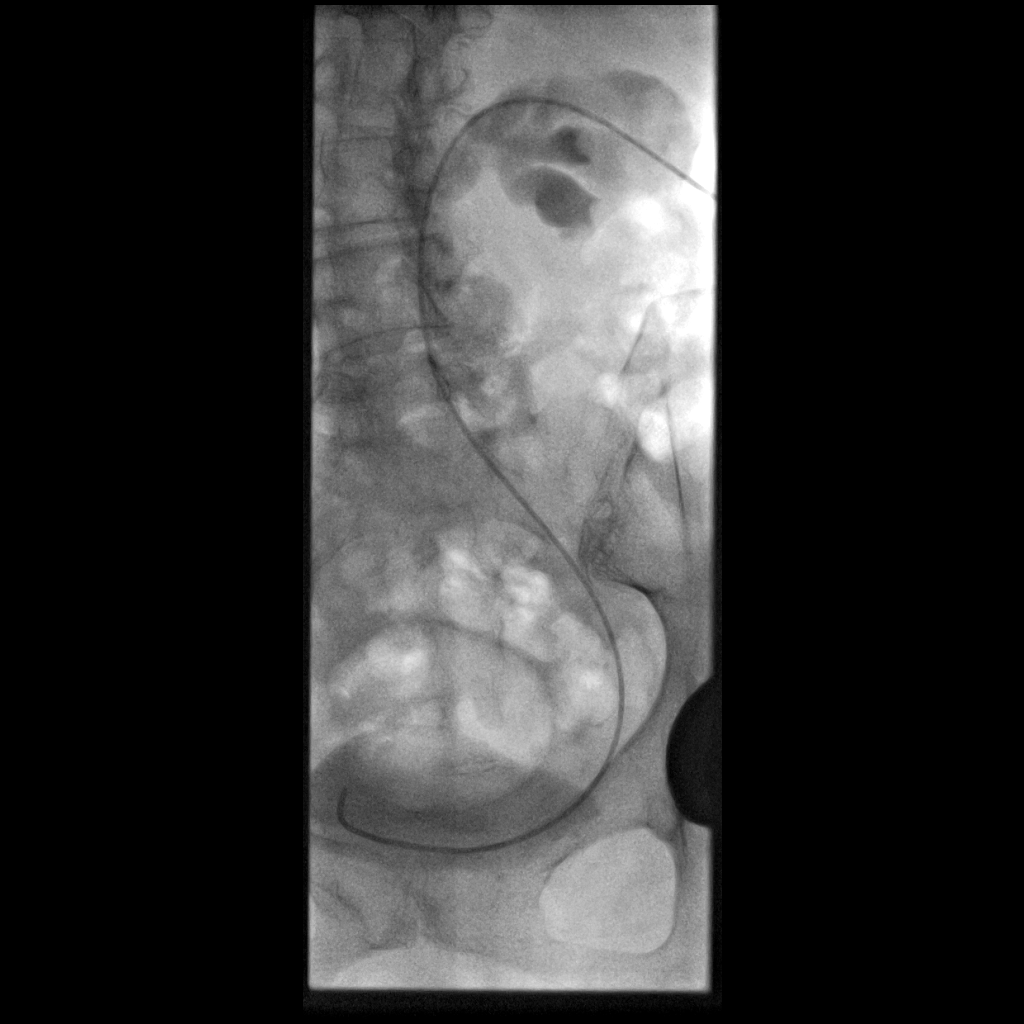

[4 of 4 positions shown; findings below may reference images not displayed]

MEDICATIONS:
Ciprofloxacin 400 mg; The antibiotic was administered in an
appropriate time frame prior to skin puncture.

ANESTHESIA/SEDATION:
Fentanyl 100 mcg IV; Versed 2.5 mg IV

Moderate Sedation Time:  20 minutes

The patient was continuously monitored during the procedure by the
interventional radiology nurse under my direct supervision.

CONTRAST:  10 mL Omnipaque 300-administered into the collecting
system(s)

FLUOROSCOPY TIME:  Fluoroscopy Time: 2 minutes 36 seconds (10 mGy).

COMPLICATIONS:
None immediate.

PROCEDURE:
Informed written consent was obtained from the patient after a
thorough discussion of the procedural risks, benefits and
alternatives. All questions were addressed. Maximal Sterile Barrier
Technique was utilized including caps, mask, sterile gowns, sterile
gloves, sterile drape, hand hygiene and skin antiseptic. A timeout
was performed prior to the initiation of the procedure.

Patient was placed prone. The right flank was prepped and draped in
sterile fashion. Right kidney was evaluated using ultrasound
guidance. Skin was anesthetized with 1% lidocaine. Using ultrasound
guidance, 21 gauge needle was directed into a dilated upper/mid pole
calyx. Needle position was confirmed within the collecting system
using ultrasound. Contrast injection confirmed placement in the
dilated calyx. A 0.018 wire was easily advanced to the renal pelvis
and Accustick dilator set was placed. Tract was dilated to
accommodate a 5 French Kumpe catheter. Kumpe catheter was advanced
beyond the renal pelvic stone into the urinary bladder using a stiff
Glidewire. Contrast injection confirmed placement in the urinary
bladder. Catheter was sutured to skin and capped.

Fluoroscopic and ultrasound images were taken and saved for
documentation.
FINDINGS: Moderate right hydronephrosis. Access obtained from a upper/mid
calyx. Catheter was advanced beyond the renal pelvic stone and tip
was placed in the urinary bladder.
IMPRESSION: Successful placement of right nephroureteral catheter with
ultrasound and fluoroscopic guidance.

## 2020-12-24 ENCOUNTER — Telehealth: Payer: Self-pay | Admitting: General Surgery

## 2020-12-24 NOTE — Telephone Encounter (Signed)
Tried calling about pathology but no VM set up yet.  Will need appt with oncology.

## 2020-12-27 ENCOUNTER — Telehealth: Payer: Self-pay | Admitting: Oncology

## 2020-12-27 ENCOUNTER — Other Ambulatory Visit: Payer: Self-pay | Admitting: Oncology

## 2020-12-27 NOTE — Progress Notes (Incomplete)
Danielle [name]  [phone] [phone]     ID: Danielle [name] DOB: Apr 07, 1936  MR#: [id number]  RDE#:[id number]  Patient Care Team: Biagio Borg, MD as PCP - Particia Lather, Revonda Standard, MD as PCP - Cardiology (Cardiology) Jovita Kussmaul, MD as Consulting Physician (General Surgery) Magrinat, Virgie Dad, MD as Consulting Physician (Oncology) Ladene Artist, MD as Consulting Physician (Gastroenterology) Deboraha Sprang, MD as Consulting Physician (Cardiology) Kennon Holter, NP as Nurse Practitioner (Nurse Practitioner) Gareth Morgan, MD as Referring Physician (Orthopedic Surgery) Leandrew Koyanagi, MD as Attending Physician (Orthopedic Surgery) OTHER MD:   CHIEF COMPLAINT: Triple negative breast cancer (s/p right mastectomy)  CURRENT TREATMENT: Observation   INTERVAL HISTORY: Danielle [name] returns today for follow-up of her triple negative breast cancer. She was last seen here on 07/23/2019. She continues under observation.    She presented to our symptom management clinic on 09/10/2020 for a right chest wall nodule. She was referred to Dr. Barry Dienes, who performed a biopsy of the nodule on 12/13/2020. Pathology from the procedure ([id number]) revealed findings consistent with recurrent high grade invasive ductal carcinoma with perineural invasion.  Her most recent left screening mammography from 09/26/2019 at [facility] showed: breast density category D; no evidence of malignancy.    REVIEW OF SYSTEMS: Danielle [name]    COVID 19 VACCINATION STATUS:    BREAST CANCER HISTORY: From the original intake note:  Danielle [name] herself noted a change in her right breast sometime in September 2017. She brought this to medical attention and on 09/19/2016 had diagnostic bilateral mammography with tomography and [facility]. The breast density was category D. There was an irregular mass in the upper  central breast. This was spiculated and measured at least 2 cm. There were also numerous linear calcifications in the upper outer quadrant of the right breast spanning a minimum of 2.0 cm. On physical exam there was a rounded mobile mass in the upper inner quadrant of the right breast. Ultrasound of the right breast confirmed an irregular hypoechoic mass at the 12:30 o'clock position 5 cm from the nipple measuring 1.5 cm, with a 0.7 cm satellite nodule.  On 09/20/2016 the patient underwent biopsy of the right breast 2. Biopsy #1 described as upper outer quadrant showed high-grade ductal carcinoma in situ estrogen and progesterone receptor negative. Right breast biopsy #2 described as 12:30 o'clock showed invasive ductal carcinoma, estrogen and progesterone receptor negative, HER-2 not amplified with an MIB-1 of 1.24 and the number per cell 1.80 , and the MIB-1  was 80%.  The patient met with my partner Dr. Sonny Dandy January 2018. He recommended Taxotere and Cytoxan chemotherapy for 4 cycles after her surgery.  On 01/11/2017 the patient underwent right mastectomy with sentinel lymph node biopsy. The final pathology here ([id number]) showed in the right breast, invasive ductal carcinoma, grade 3, measuring 2.3 cm with negative margins. All 8 lymph nodes examined, including 3 sentinel lymph nodes, were clear.  Her subsequent history is as detailed below   PAST MEDICAL HISTORY: Past Medical History:  Diagnosis Date  . ALLERGIC RHINITIS 01/09/2008  . ANEMIA-NOS 01/09/2008   pt doesn't remember  . ANXIETY 01/09/2008  . ASTHMATIC BRONCHITIS, ACUTE 10/20/2008  . AV BLOCK, COMPLETE 12/16/2009  . Breast cancer, right ([place])   . DEPRESSION 01/09/2008  . Dizziness and giddiness 02/25/2010  . DVT, HX OF 01/09/2008   after delivery of 4 th child  .  Genetic testing 06/04/2017   Danielle [name] underwent genetic counseling and testing for hereditary cancer syndromes on 05/24/2017. Her results were negative for mutations  in all 46 genes analyzed by [facility]'s 46-gene Common Hereditary Cancers Panel. Genes analyzed include: APC, ATM, AXIN2, BARD1, BMPR1A, BRCA1, BRCA2, BRIP1, CDH1, CDKN2A, CHEK2, CTNNA1, DICER1, EPCAM, GREM1, HOXB13, KIT, MEN1, MLH1, MSH2, MSH3, MSH6, MUTYH, NBN  . GERD 01/09/2008  . GLUCOSE INTOLERANCE 01/09/2008  . History of kidney stones   . HYPERLIPIDEMIA 01/09/2008  . HYPERTENSION 01/09/2008  . HYPOTHYROIDISM 01/09/2008  . Impaired glucose tolerance 07/28/2011  . INSOMNIA-SLEEP DISORDER-UNSPEC 06/15/2009  . NEPHROLITHIASIS, HX OF 01/09/2008  . OSTEOARTHRITIS, CERVICAL SPINE 02/25/2010  . Presence of permanent cardiac pacemaker    medtronic, pacemaker dependent  . Urine incontinence     PAST SURGICAL HISTORY: Past Surgical History:  Procedure Laterality Date  . BREAST BIOPSY Right 09/20/2016   malignant  . ERCP W/ SPHICTEROTOMY     pt denies  . HEMORRHOID SURGERY     pt denies  . IR URETERAL STENT RIGHT NEW ACCESS W/O SEP NEPHROSTOMY CATH  02/09/2020  . MASTECTOMY Right 01/11/2017  . MASTECTOMY W/ SENTINEL NODE BIOPSY Right 01/11/2017   Procedure: RIGHT MASTECTOMY WITH SENTINEL LYMPH NODE BIOPSY;  Surgeon: [name] Klein, MD;  Location: [facility];  Service: General;  Laterality: Right;  . NEPHROLITHOTOMY Right 02/09/2020   Procedure: NEPHROLITHOTOMY PERCUTANEOUS;  Surgeon: Kathie Rhodes, MD;  Location: [facility];  Service: Urology;  Laterality: Right;  . PACEMAKER PLACEMENT Left    MedTronic EnPulse [id number] no heartbeat without pacemaker  . PERMANENT PACEMAKER GENERATOR CHANGE N/A 07/16/2012   Procedure: PERMANENT PACEMAKER GENERATOR CHANGE;  Surgeon: Deboraha Sprang, MD;  Location: [facility] CATH [facility];  Service: Cardiovascular;  Laterality: N/A;  . POLYPECTOMY     Uterine  . TOTAL HIP ARTHROPLASTY Right 04/28/2019   Procedure: RIGHT TOTAL HIP ARTHROPLASTY ANTERIOR APPROACH;  Surgeon: Leandrew Koyanagi, MD;  Location: [facility];  Service: Orthopedics;  Laterality: Right;    FAMILY HISTORY Family History   Problem Relation Age of Onset  . Sudden death Mother        d.38s  . Breast cancer Mother 55  . Pulmonary embolism Father        Possible  . Lung cancer Paternal Grandfather        history of smoking  . Breast cancer Maternal Aunt        d.78s  The patient's father died in his 41s from a "clot to the heart". The patient's mother was diagnosed with breast cancer in her early 42s and died from metastatic breast cancer approximately 30 years later. A maternal aunt (her mother's only sister) was also diagnosed with breast cancer in her early 19s according to the patient. The patient had 2 brothers, 3 sisters. There is no other history of breast or ovarian cancer in the family to her knowledge   GYNECOLOGIC HISTORY:  No LMP recorded. Patient is postmenopausal. She can't remember age at menarche or time of menopause. She is GX P5, including one premature birth. 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 Her husband Jonne Ply") is a Psychologist, sport and exercise. 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 The patient's son Myles Rosenthal lives in [place] where he works as an Arboriculturist. Daughter Clyde Canterbury lives in [place] and is a Scientist, research (life sciences). Daughter [name]  lives in [place] and is an Training and development officer as well as a third Land. Son [name] Klein was born premature and did not survive. Daughter Luellen Pucker lives in [place] where she is a Art gallery manager with a fine arts degree. The patient has 4 grandchildren. She is a Nurse, learning disability.    ADVANCED DIRECTIVES: In place   HEALTH MAINTENANCE: Social History   Tobacco Use  . Smoking status: Former Smoker    Packs/day: 0.20    Years: 25.00    Pack years: 5.00    Types: Cigarettes    Quit date: 11/21/1971    Years since quitting: 49.1  . Smokeless tobacco: Never Used  Vaping Use  . Vaping Use: Never used  Substance Use Topics  . Alcohol use: Yes     Comment: wine  . Drug use: No     Colonoscopy: Due  PAP:  Bone density:   Allergies  Allergen Reactions  . Codeine Nausea And Vomiting  . Oxycodone Nausea And Vomiting    Current Outpatient Medications  Medication Sig Dispense Refill  . atenolol (TENORMIN) 50 MG tablet Take 1 tablet (50 mg total) by mouth daily. 90 tablet 3  . atorvastatin (LIPITOR) 10 MG tablet TAKE 1 TABLET(10 MG) BY MOUTH DAILY Annual appt due in Oct must see provider for future refills 90 tablet 3  . azelastine (ASTELIN) 0.1 % nasal spray Place 1 spray into both nostrils as needed (Summer Allergies).     . calcium-vitamin D (OSCAL WITH D) 500-200 MG-UNIT tablet Take 1 tablet by mouth daily with breakfast.    . enalapril (VASOTEC) 10 MG tablet Take 1 tablet (10 mg total) by mouth daily. Annual appt due in Oct must see provider for future refills 90 tablet 3  . levothyroxine (SYNTHROID) 125 MCG tablet Take 1 tablet (125 mcg total) by mouth daily. 90 tablet 3  . traMADol (ULTRAM) 50 MG tablet Take 1 tablet (50 mg total) by mouth every 6 (six) hours as needed. 10 tablet 0   No current facility-administered medications for this visit.    OBJECTIVE: Older white woman in no acute distress  There were no vitals filed for this visit. Wt Readings from Last 3 Encounters:  09/10/20 111 lb 4.8 oz (50.5 kg)  06/11/20 116 lb (52.6 kg)  02/09/20 118 lb 13.3 oz (53.9 kg)   There is no height or weight on file to calculate BMI.    ECOG FS:1 - Symptomatic but completely ambulatory  Sclerae unicteric, EOMs intact Wearing a mask No cervical or supraclavicular adenopathy Lungs no rales or rhonchi Heart regular rate and rhythm Abd soft, nontender, positive bowel sounds MSK no focal spinal tenderness, no upper extremity lymphedema Neuro: nonfocal, well oriented, appropriate affect Breasts:    {Telehealth Visit}   LAB RESULTS:  CMP     Component Value Date/Time   NA 141 09/10/2020 1337   NA 140 07/25/2017 1347    K 4.6 09/10/2020 1337   K 4.3 07/25/2017 1347   CL 105 09/10/2020 1337   CO2 30 09/10/2020 1337   CO2 26 07/25/2017 1347   GLUCOSE 69 (L) 09/10/2020 1337   GLUCOSE 77 07/25/2017 1347   BUN 19 09/10/2020 1337   BUN 27.4 (H) 07/25/2017 1347   CREATININE 1.11 (H) 09/10/2020 1337   CREATININE 1.1 07/25/2017 1347   CALCIUM 10.1 09/10/2020 1337   CALCIUM 9.8 07/25/2017 1347   PROT 6.9 09/10/2020 1337   PROT 6.6 07/25/2017 1347   ALBUMIN 4.3 09/10/2020 1337   ALBUMIN  4.0 07/25/2017 1347   AST 20 09/10/2020 1337   AST 29 07/25/2017 1347   ALT 9 09/10/2020 1337   ALT 28 07/25/2017 1347   ALKPHOS 83 09/10/2020 1337   ALKPHOS 75 07/25/2017 1347   BILITOT 1.2 09/10/2020 1337   BILITOT 1.21 (H) 07/25/2017 1347   GFRNONAA 50 (L) 09/10/2020 1337   GFRNONAA 57 (L) 07/15/2012 1714   GFRAA 58 (L) 02/03/2020 1448   GFRAA 66 07/15/2012 1714    No results found for: Ronnald Ramp, A1GS, A2GS, BETS, BETA2SER, GAMS, MSPIKE, SPEI  No results found for: Nils Pyle, [facility]  Lab Results  Component Value Date   WBC 5.1 09/10/2020   NEUTROABS 2.8 09/10/2020   HGB 12.1 09/10/2020   HCT 37.4 09/10/2020   MCV 92.3 09/10/2020   PLT 150 09/10/2020      Chemistry      Component Value Date/Time   NA 141 09/10/2020 1337   NA 140 07/25/2017 1347   K 4.6 09/10/2020 1337   K 4.3 07/25/2017 1347   CL 105 09/10/2020 1337   CO2 30 09/10/2020 1337   CO2 26 07/25/2017 1347   BUN 19 09/10/2020 1337   BUN 27.4 (H) 07/25/2017 1347   CREATININE 1.11 (H) 09/10/2020 1337   CREATININE 1.1 07/25/2017 1347      Component Value Date/Time   CALCIUM 10.1 09/10/2020 1337   CALCIUM 9.8 07/25/2017 1347   ALKPHOS 83 09/10/2020 1337   ALKPHOS 75 07/25/2017 1347   AST 20 09/10/2020 1337   AST 29 07/25/2017 1347   ALT 9 09/10/2020 1337   ALT 28 07/25/2017 1347   BILITOT 1.2 09/10/2020 1337   BILITOT 1.21 (H) 07/25/2017 1347     No results found for: LABCA2  No components  found for: BOFBPZ025  No results for input(s): INR in the last 168 hours.  Urinalysis    Component Value Date/Time   COLORURINE YELLOW 10/09/2019 1523   APPEARANCEUR Cloudy (A) 10/09/2019 1523   LABSPEC >=1.030 (A) 10/09/2019 1523   PHURINE 6.0 10/09/2019 1523   GLUCOSEU NEGATIVE 10/09/2019 1523   HGBUR LARGE (A) 10/09/2019 1523   BILIRUBINUR NEGATIVE 10/09/2019 1523   [place] 10/09/2019 1523   PROTEINUR NEGATIVE 08/19/2018 1815   UROBILINOGEN 0.2 10/09/2019 1523   NITRITE NEGATIVE 10/09/2019 1523   LEUKOCYTESUR TRACE (A) 10/09/2019 1523    STUDIES: No results found.   ELIGIBLE FOR AVAILABLE RESEARCH PROTOCOL: no  ASSESSMENT: 85 y.o. [place] woman woman status post right breast overlapping site biopsies 09/20/2016 showing (a) invasive ductal carcinoma, estrogen and progesterone receptor negative, with an MIB-1 of 80%, and no HER-2 amplification. (b) ductal carcinoma in situ, high-grade, estrogen and progesterone receptor negative  (1) status post right mastectomy with sentinel lymph node and axillary lymph node sampling 01/11/2017 for a pT2 pN0, stage IIB invasive ductal carcinoma, grade 3, with negative margins  (2) genetics testing 05/24/2017 through [facility]'s Common Hereditary Cancers Panel found no deleterious mutations in APC, ATM, AXIN2, BARD1, BMPR1A, BRCA1, BRCA2, BRIP1, CDH1, CDKN2A, CHEK2, CTNNA1, DICER1, EPCAM, GREM1, HOXB13, KIT, MEN1, MLH1, MSH2, MSH3, MSH6, MUTYH, NBN, NF1, NTHL1, PALB2, PDGFRA, PMS2, POLD1, POLE, PTEN, RAD50, RAD51C, RAD51D, SDHA, SDHB, SDHC, SDHD, SMAD4, SMARCA4, STK11, TP53, TSC1, TSC2, and VHL.  (3) the patient opted against adjuvant chemotherapy   PLAN: Danielle [name] is now 2-1/2 years out from definitive surgery for her breast cancer with no evidence of disease recurrence.  This is very favorable.  She has opted for observation alone but she is  doing a good diet and exercise program.  She is a bit behind on her mammography and I am  writing the order for her to have a mammogram at the breast center sometime within the next month or 2.  She did excellently by her account on her right hip arthroplasty and is delighted with the care she is receiving from Dr. Erlinda Hong  She knows to call for any other issue that may develop before her next visit here which will be in 1 year   Magrinat, Virgie Dad, MD  12/27/20 9:55 PM Medical Oncology and Hematology [facility] [address], [place] [address] Tel. [phone]    Fax. [phone]   I, Wilburn Mylar, am acting as scribe for Dr. Virgie Dad. Magrinat.  {Add scribe attestation statement}   *Total Encounter Time as defined by the Centers for Medicare and Medicaid Services includes, in addition to the face-to-face time of a patient visit (documented in the note above) non-face-to-face time: obtaining and reviewing outside history, ordering and reviewing medications, tests or procedures, care coordination (communications with other health care professionals or caregivers) and documentation in the medical record.

## 2020-12-27 NOTE — Telephone Encounter (Signed)
Scheduled appt per 2/7 sch msg - unable to reach pt. Left message for patient with appt date and time   

## 2020-12-28 ENCOUNTER — Inpatient Hospital Stay: Payer: Medicare HMO | Admitting: Oncology

## 2021-01-03 ENCOUNTER — Other Ambulatory Visit: Payer: Self-pay | Admitting: General Surgery

## 2021-01-14 ENCOUNTER — Other Ambulatory Visit: Payer: Self-pay | Admitting: Oncology

## 2021-01-17 NOTE — Progress Notes (Incomplete)
[place]  [phone] Fax:([phone]) [phone]     ID: TYGER OKA DOB: 16-Jul-1936  MR#: [id number]  YCX#:[id number]  Patient Care Team: Biagio Borg, MD as PCP - Particia Lather, Revonda Standard, MD as PCP - Cardiology (Cardiology) Jovita Kussmaul, MD as Consulting Physician (General Surgery) Magrinat, Virgie Dad, MD as Consulting Physician (Oncology) Ladene Artist, MD as Consulting Physician (Gastroenterology) Deboraha Sprang, MD as Consulting Physician (Cardiology) Kennon Holter, NP as Nurse Practitioner (Nurse Practitioner) Gareth Morgan, MD as Referring Physician (Orthopedic Surgery) Leandrew Koyanagi, MD as Attending Physician (Orthopedic Surgery) OTHER MD:   CHIEF COMPLAINT: Triple negative breast cancer (s/p right mastectomy)  CURRENT TREATMENT: Observation   INTERVAL HISTORY: Danielle [name] is contacted today for follow-up of her triple negative breast cancer. She was last seen here on 07/23/2019. She continues under observation.    Since her last visit, she presented to the symptom management clinic with a right chest wall nodule on 09/10/2020. She was referred to Dr. Barry Dienes on 11/29/2020. She proceeded to biopsy of the nodule on 12/13/2020. Pathology from the procedure ([id number]) revealed: findings consistent with recurrent high grade invasive ductal carcinoma; perineural invasion present; tumor focally extends to resection margin.  Of note, her most recent left screening mammogram was on 09/26/2019 at [facility].   REVIEW OF SYSTEMS: Danielle [name]    COVID 19 VACCINATION STATUS: fully vaccinated [employer]), with booster 08/2020   BREAST CANCER HISTORY: From the original intake note:  Danielle [name] herself noted a change in her right breast sometime in September 2017. She brought this to medical attention and on 09/19/2016 had diagnostic bilateral mammography with tomography and [facility]. The breast density was category D. There was an irregular mass in the upper central breast. This was spiculated and measured at least 2 cm. There were also numerous linear calcifications in the upper outer quadrant of the right breast spanning a minimum of 2.0 cm. On physical exam there was a rounded mobile mass in the upper inner quadrant of the right breast. Ultrasound of the right breast confirmed an irregular hypoechoic mass at the 12:30 o'clock position 5 cm from the nipple measuring 1.5 cm, with a 0.7 cm satellite nodule.  On 09/20/2016 the patient underwent biopsy of the right breast 2. Biopsy #1 described as upper outer quadrant showed high-grade ductal carcinoma in situ estrogen and progesterone receptor negative. Right breast biopsy #2 described as 12:30 o'clock showed invasive ductal carcinoma, estrogen and progesterone receptor negative, HER-2 not amplified with an MIB-1 of 1.24 and the number per cell 1.80 , and the MIB-1  was 80%.  The patient met with my partner Dr. Sonny Dandy January 2018. He recommended Taxotere and Cytoxan chemotherapy for 4 cycles after her surgery.  On 01/11/2017 the patient underwent right mastectomy with sentinel lymph node biopsy. The final pathology here ([id number]) showed in the right breast, invasive ductal carcinoma, grade 3, measuring 2.3 cm with negative margins. All 8 lymph nodes examined, including 3 sentinel lymph nodes, were clear.  The patient declined a follow-up appointment with Dr. Sonny Dandy.She was scheduled to see me 03/12/2017 but did not show.  Her subsequent history is as detailed below   PAST MEDICAL HISTORY: Past Medical History:  Diagnosis Date  . ALLERGIC RHINITIS 01/09/2008  . ANEMIA-NOS 01/09/2008   pt doesn't remember  . ANXIETY 01/09/2008  . ASTHMATIC BRONCHITIS, ACUTE 10/20/2008  . AV BLOCK, COMPLETE 12/16/2009  .  Breast cancer, right ([place])   . DEPRESSION 01/09/2008  . Dizziness and giddiness 02/25/2010  . DVT, HX OF 01/09/2008    after delivery of 4 th child  . Genetic testing 06/04/2017   Ms. Fabre underwent genetic counseling and testing for hereditary cancer syndromes on 05/24/2017. Her results were negative for mutations in all 46 genes analyzed by [facility]'s 46-gene Common Hereditary Cancers Panel. Genes analyzed include: APC, ATM, AXIN2, BARD1, BMPR1A, BRCA1, BRCA2, BRIP1, CDH1, CDKN2A, CHEK2, CTNNA1, DICER1, EPCAM, GREM1, HOXB13, KIT, MEN1, MLH1, MSH2, MSH3, MSH6, MUTYH, NBN  . GERD 01/09/2008  . GLUCOSE INTOLERANCE 01/09/2008  . History of kidney stones   . HYPERLIPIDEMIA 01/09/2008  . HYPERTENSION 01/09/2008  . HYPOTHYROIDISM 01/09/2008  . Impaired glucose tolerance 07/28/2011  . INSOMNIA-SLEEP DISORDER-UNSPEC 06/15/2009  . NEPHROLITHIASIS, HX OF 01/09/2008  . OSTEOARTHRITIS, CERVICAL SPINE 02/25/2010  . Presence of permanent cardiac pacemaker    medtronic, pacemaker dependent  . Urine incontinence     PAST SURGICAL HISTORY: Past Surgical History:  Procedure Laterality Date  . BREAST BIOPSY Right 09/20/2016   malignant  . ERCP W/ SPHICTEROTOMY     pt denies  . HEMORRHOID SURGERY     pt denies  . IR URETERAL STENT RIGHT NEW ACCESS W/O SEP NEPHROSTOMY CATH  02/09/2020  . MASTECTOMY Right 01/11/2017  . MASTECTOMY W/ SENTINEL NODE BIOPSY Right 01/11/2017   Procedure: RIGHT MASTECTOMY WITH SENTINEL LYMPH NODE BIOPSY;  Surgeon: [name] Klein, MD;  Location: [facility];  Service: General;  Laterality: Right;  . NEPHROLITHOTOMY Right 02/09/2020   Procedure: NEPHROLITHOTOMY PERCUTANEOUS;  Surgeon: Kathie Rhodes, MD;  Location[facility];  Service: Urology;  Laterality: Right;  . PACEMAKER PLACEMENT Left    MedTronic EnPulse [id number] no heartbeat without pacemaker  . PERMANENT PACEMAKER GENERATOR CHANGE N/A 07/16/2012   Procedure: PERMANENT PACEMAKER GENERATOR CHANGE;  Surgeon: Deboraha Sprang, MD;  [facility];  Service: Cardiovascular;  Laterality: N/A;  . POLYPECTOMY     Uterine  . TOTAL HIP ARTHROPLASTY Right  04/28/2019   Procedure: RIGHT TOTAL HIP ARTHROPLASTY ANTERIOR APPROACH;  Surgeon: Leandrew Koyanagi, MD;  Location: [facility];  Service: Orthopedics;  Laterality: Right;    FAMILY HISTORY Family History  Problem Relation Age of Onset  . Sudden death Mother        d.46s  . Breast cancer Mother 55  . Pulmonary embolism Father        Possible  . Lung cancer Paternal Grandfather        history of smoking  . Breast cancer Maternal Aunt        d.78s  The patient's father died in his 67s from a "clot to the heart". The patient's mother was diagnosed with breast cancer in her early 28s and died from metastatic breast cancer approximately 30 years later. A maternal aunt (her mother's only sister) was also diagnosed with breast cancer in her early 52s according to the patient. The patient had 2 brothers, 3 sisters. There is no other history of breast or ovarian cancer in the family to her knowledge   GYNECOLOGIC HISTORY:  No LMP recorded. Patient is postmenopausal. She can't remember age at menarche or time of menopause. She is GX P5, including one premature birth. 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 Her husband Jonne Ply") is a Psychologist, sport and exercise. 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 The patient's son Myles Rosenthal lives in [place] where he works as an Arboriculturist. Daughter Clyde Canterbury lives in [place] and is a Scientist, research (life sciences). Daughter [name] lives in [place] and is an Training and development officer as well as a third Land. Son [name] Klein was born premature and did not survive. Daughter Luellen Pucker lives in [place] where she is a Art gallery manager with a fine arts degree. The patient has 4 grandchildren. She is a Nurse, learning disability.    ADVANCED DIRECTIVES: In place   HEALTH MAINTENANCE: Social History   Tobacco Use  . Smoking status: Former Smoker    Packs/day: 0.20    Years: 25.00    Pack years: 5.00     Types: Cigarettes    Quit date: 11/21/1971    Years since quitting: 49.1  . Smokeless tobacco: Never Used  Vaping Use  . Vaping Use: Never used  Substance Use Topics  . Alcohol use: Yes    Comment: wine  . Drug use: No     Colonoscopy: Due  PAP:  Bone density:   Allergies  Allergen Reactions  . Codeine Nausea And Vomiting  . Oxycodone Nausea And Vomiting    Current Outpatient Medications  Medication Sig Dispense Refill  . atenolol (TENORMIN) 50 MG tablet Take 1 tablet (50 mg total) by mouth daily. 90 tablet 3  . atorvastatin (LIPITOR) 10 MG tablet TAKE 1 TABLET(10 MG) BY MOUTH DAILY Annual appt due in Oct must see provider for future refills 90 tablet 3  . azelastine (ASTELIN) 0.1 % nasal spray Place 1 spray into both nostrils as needed (Summer Allergies).     . calcium-vitamin D (OSCAL WITH D) 500-200 MG-UNIT tablet Take 1 tablet by mouth daily with breakfast.    . enalapril (VASOTEC) 10 MG tablet Take 1 tablet (10 mg total) by mouth daily. Annual appt due in Oct must see provider for future refills 90 tablet 3  . levothyroxine (SYNTHROID) 125 MCG tablet Take 1 tablet (125 mcg total) by mouth daily. 90 tablet 3  . traMADol (ULTRAM) 50 MG tablet Take 1 tablet (50 mg total) by mouth every 6 (six) hours as needed. 10 tablet 0   No current facility-administered medications for this visit.    OBJECTIVE: Older white woman in no acute distress  There were no vitals filed for this visit. Wt Readings from Last 3 Encounters:  09/10/20 111 lb 4.8 oz (50.5 kg)  06/11/20 116 lb (52.6 kg)  02/09/20 118 lb 13.3 oz (53.9 kg)   There is no height or weight on file to calculate BMI.    ECOG FS:1 - Symptomatic but completely ambulatory  Sclerae unicteric, EOMs intact Wearing a mask No cervical or supraclavicular adenopathy Lungs no rales or rhonchi Heart regular rate and rhythm Abd soft, nontender, positive bowel sounds MSK no focal spinal tenderness, no upper extremity  lymphedema Neuro: nonfocal, well oriented, appropriate affect Breasts:    {Telehealth Visit}   LAB RESULTS:  CMP     Component Value Date/Time   NA 141 09/10/2020 1337   NA 140 07/25/2017 1347   K 4.6 09/10/2020 1337   K 4.3 07/25/2017 1347   CL 105 09/10/2020 1337   CO2 30 09/10/2020 1337   CO2 26 07/25/2017 1347   GLUCOSE 69 (L) 09/10/2020 1337   GLUCOSE 77 07/25/2017 1347   BUN 19 09/10/2020 1337   BUN 27.4 (H) 07/25/2017 1347   CREATININE 1.11 (H) 09/10/2020 1337   CREATININE 1.1 07/25/2017 1347  CALCIUM 10.1 09/10/2020 1337   CALCIUM 9.8 07/25/2017 1347   PROT 6.9 09/10/2020 1337   PROT 6.6 07/25/2017 1347   ALBUMIN 4.3 09/10/2020 1337   ALBUMIN 4.0 07/25/2017 1347   AST 20 09/10/2020 1337   AST 29 07/25/2017 1347   ALT 9 09/10/2020 1337   ALT 28 07/25/2017 1347   ALKPHOS 83 09/10/2020 1337   ALKPHOS 75 07/25/2017 1347   BILITOT 1.2 09/10/2020 1337   BILITOT 1.21 (H) 07/25/2017 1347   GFRNONAA 50 (L) 09/10/2020 1337   GFRNONAA 57 (L) 07/15/2012 1714   GFRAA 58 (L) 02/03/2020 1448   GFRAA 66 07/15/2012 1714    No results found for: Ronnald Ramp, A1GS, A2GS, BETS, BETA2SER, GAMS, MSPIKE, SPEI  No results found for: Nils Pyle, [facility]  Lab Results  Component Value Date   WBC 5.1 09/10/2020   NEUTROABS 2.8 09/10/2020   HGB 12.1 09/10/2020   HCT 37.4 09/10/2020   MCV 92.3 09/10/2020   PLT 150 09/10/2020      Chemistry      Component Value Date/Time   NA 141 09/10/2020 1337   NA 140 07/25/2017 1347   K 4.6 09/10/2020 1337   K 4.3 07/25/2017 1347   CL 105 09/10/2020 1337   CO2 30 09/10/2020 1337   CO2 26 07/25/2017 1347   BUN 19 09/10/2020 1337   BUN 27.4 (H) 07/25/2017 1347   CREATININE 1.11 (H) 09/10/2020 1337   CREATININE 1.1 07/25/2017 1347      Component Value Date/Time   CALCIUM 10.1 09/10/2020 1337   CALCIUM 9.8 07/25/2017 1347   ALKPHOS 83 09/10/2020 1337   ALKPHOS 75 07/25/2017 1347   AST 20  09/10/2020 1337   AST 29 07/25/2017 1347   ALT 9 09/10/2020 1337   ALT 28 07/25/2017 1347   BILITOT 1.2 09/10/2020 1337   BILITOT 1.21 (H) 07/25/2017 1347       No results found for: LABCA2  No components found for: [id number]  No results for input(s): INR in the last 168 hours.  Urinalysis    Component Value Date/Time   COLORURINE YELLOW 10/09/2019 1523   APPEARANCEUR Cloudy (A) 10/09/2019 1523   LABSPEC >=1.030 (A) 10/09/2019 1523   PHURINE 6.0 10/09/2019 1523   GLUCOSEU NEGATIVE 10/09/2019 1523   HGBUR LARGE (A) 10/09/2019 1523   BILIRUBINUR NEGATIVE 10/09/2019 1523   [place] 10/09/2019 1523   PROTEINUR NEGATIVE 08/19/2018 1815   UROBILINOGEN 0.2 10/09/2019 1523   NITRITE NEGATIVE 10/09/2019 1523   LEUKOCYTESUR TRACE (A) 10/09/2019 1523    STUDIES: No results found.   ELIGIBLE FOR AVAILABLE RESEARCH PROTOCOL: no  ASSESSMENT: 85 y.o. [place] woman woman status post right breast overlapping site biopsies 09/20/2016 showing (a) invasive ductal carcinoma, estrogen and progesterone receptor negative, with an MIB-1 of 80%, and no HER-2 amplification. (b) ductal carcinoma in situ, high-grade, estrogen and progesterone receptor negative  (1) status post right mastectomy with sentinel lymph node and axillary lymph node sampling 01/11/2017 for a pT2 pN0, stage IIB invasive ductal carcinoma, grade 3, with negative margins  (2) genetics testing 05/24/2017 through [facility]'s Common Hereditary Cancers Panel found no deleterious mutations in APC, ATM, AXIN2, BARD1, BMPR1A, BRCA1, BRCA2, BRIP1, CDH1, CDKN2A, CHEK2, CTNNA1, DICER1, EPCAM, GREM1, HOXB13, KIT, MEN1, MLH1, MSH2, MSH3, MSH6, MUTYH, NBN, NF1, NTHL1, PALB2, PDGFRA, PMS2, POLD1, POLE, PTEN, RAD50, RAD51C, RAD51D, SDHA, SDHB, SDHC, SDHD, SMAD4, SMARCA4, STK11, TP53, TSC1, TSC2, and VHL.  (3) the patient opted against adjuvant chemotherapy   PLAN: Danielle [name]  is now 2-1/2 years out from definitive surgery for her  breast cancer with no evidence of disease recurrence.  This is very favorable.  She has opted for observation alone but she is doing a good diet and exercise program.  She is a bit behind on her mammography and I am writing the order for her to have a mammogram at the breast center sometime within the next month or 2.  She did excellently by her account on her right hip arthroplasty and is delighted with the care she is receiving from Dr. Erlinda Hong  She knows to call for any other issue that may develop before her next visit here which will be in 1 year   Magrinat, Virgie Dad, MD  01/17/21 11:05 PM Medical Oncology and Hematology [facility] [place], [place] [address] Tel. [phone]    Fax. [phone]   I, Wilburn Mylar, am acting as scribe for Dr. Virgie Dad. Magrinat.  {Add scribe attestation statement}   *Total Encounter Time as defined by the Centers for Medicare and Medicaid Services includes, in addition to the face-to-face time of a patient visit (documented in the note above) non-face-to-face time: obtaining and reviewing outside history, ordering and reviewing medications, tests or procedures, care coordination (communications with other health care professionals or caregivers) and documentation in the medical record.

## 2021-01-18 ENCOUNTER — Telehealth: Payer: Self-pay | Admitting: Oncology

## 2021-01-18 ENCOUNTER — Inpatient Hospital Stay: Payer: Medicare HMO | Admitting: Oncology

## 2021-01-18 NOTE — Telephone Encounter (Signed)
Called pt to reschedule appts per 3/1 sch msg. Left msg for pt to call office to reschedule.

## 2021-01-21 ENCOUNTER — Ambulatory Visit: Payer: Medicare HMO | Admitting: Internal Medicine

## 2021-01-23 NOTE — Progress Notes (Deleted)
Cardiology Office Note Date:  01/23/2021  Patient ID:  Danielle [name] Dec 19, 1935, MRN [id number] PCP:  Biagio Borg, MD  Cardiologist:  *** Electrophysiologist: ***  ***refresh   Chief Complaint: *** annual visit  History of Present Illness: Danielle [name] is a 85 y.o. female with history of breast cancer (R), following with oncology, HTN, HLD, hypothyroidism, CHB w/PPM.  She comes in today to be seen for Dr. Caryl Comes, last seen by him Feb 2020, planned for hip surgery (cleared), mentioned A lead threshold was increased.  She saw A. [place], [place] Feb 2021, doing well, mentioned mild fluttering, no notable device findings, no changes made. She mentioned difficulty with transmissions, though worked well in the office.   *** A lead *** symptoms *** labs, lipids...   Device information MDT dual chamber PPM implanted 1997, gen change 07/16/12   Past Medical History:  Diagnosis Date  . ALLERGIC RHINITIS 01/09/2008  . ANEMIA-NOS 01/09/2008   pt doesn't remember  . ANXIETY 01/09/2008  . ASTHMATIC BRONCHITIS, ACUTE 10/20/2008  . AV BLOCK, COMPLETE 12/16/2009  . Breast cancer, right ([place])   . DEPRESSION 01/09/2008  . Dizziness and giddiness 02/25/2010  . DVT, HX OF 01/09/2008   after delivery of 4 th child  . Genetic testing 06/04/2017   Ms. Overholt underwent genetic counseling and testing for hereditary cancer syndromes on 05/24/2017. Her results were negative for mutations in all 46 genes analyzed by [facility]'s 46-gene Common Hereditary Cancers Panel. Genes analyzed include: APC, ATM, AXIN2, BARD1, BMPR1A, BRCA1, BRCA2, BRIP1, CDH1, CDKN2A, CHEK2, CTNNA1, DICER1, EPCAM, GREM1, HOXB13, KIT, MEN1, MLH1, MSH2, MSH3, MSH6, MUTYH, NBN  . GERD 01/09/2008  . GLUCOSE INTOLERANCE 01/09/2008  . History of kidney stones   . HYPERLIPIDEMIA 01/09/2008  . HYPERTENSION 01/09/2008  . HYPOTHYROIDISM 01/09/2008  . Impaired glucose tolerance 07/28/2011  . INSOMNIA-SLEEP DISORDER-UNSPEC 06/15/2009  .  NEPHROLITHIASIS, HX OF 01/09/2008  . OSTEOARTHRITIS, CERVICAL SPINE 02/25/2010  . Presence of permanent cardiac pacemaker    medtronic, pacemaker dependent  . Urine incontinence     Past Surgical History:  Procedure Laterality Date  . BREAST BIOPSY Right 09/20/2016   malignant  . ERCP W/ SPHICTEROTOMY     pt denies  . HEMORRHOID SURGERY     pt denies  . IR URETERAL STENT RIGHT NEW ACCESS W/O SEP NEPHROSTOMY CATH  02/09/2020  . MASTECTOMY Right 01/11/2017  . MASTECTOMY W/ SENTINEL NODE BIOPSY Right 01/11/2017   Procedure: RIGHT MASTECTOMY WITH SENTINEL LYMPH NODE BIOPSY;  Surgeon: [name] Klein, MD;  Location: [facility];  Service: General;  Laterality: Right;  . NEPHROLITHOTOMY Right 02/09/2020   Procedure: NEPHROLITHOTOMY PERCUTANEOUS;  Surgeon: Kathie Rhodes, MD;  [facility];  Service: Urology;  Laterality: Right;  . PACEMAKER PLACEMENT Left    MedTronic EnPulse E2DR01--06 no heartbeat without pacemaker  . PERMANENT PACEMAKER GENERATOR CHANGE N/A 07/16/2012   Procedure: PERMANENT PACEMAKER GENERATOR CHANGE;  Surgeon: Deboraha Sprang, MD;  Location: [facility] CATH LAB;  Service: Cardiovascular;  Laterality: N/A;  . POLYPECTOMY     Uterine  . TOTAL HIP ARTHROPLASTY Right 04/28/2019   Procedure: RIGHT TOTAL HIP ARTHROPLASTY ANTERIOR APPROACH;  Surgeon: Leandrew Koyanagi, MD;  Location: [facility];  Service: Orthopedics;  Laterality: Right;    Current Outpatient Medications  Medication Sig Dispense Refill  . atenolol (TENORMIN) 50 MG tablet Take 1 tablet (50 mg total) by mouth daily. 90 tablet 3  . atorvastatin (LIPITOR) 10 MG tablet TAKE 1 TABLET(10 MG) BY MOUTH  DAILY Annual appt due in Oct must see provider for future refills 90 tablet 3  . azelastine (ASTELIN) 0.1 % nasal spray Place 1 spray into both nostrils as needed (Summer Allergies).     . calcium-vitamin D (OSCAL WITH D) 500-200 MG-UNIT tablet Take 1 tablet by mouth daily with breakfast.    . enalapril (VASOTEC) 10 MG tablet Take 1 tablet (10 mg  total) by mouth daily. Annual appt due in Oct must see provider for future refills 90 tablet 3  . levothyroxine (SYNTHROID) 125 MCG tablet Take 1 tablet (125 mcg total) by mouth daily. 90 tablet 3  . traMADol (ULTRAM) 50 MG tablet Take 1 tablet (50 mg total) by mouth every 6 (six) hours as needed. 10 tablet 0   No current facility-administered medications for this visit.    Allergies:   Codeine and Oxycodone   Social History:  The patient  reports that she quit smoking about 49 years ago. Her smoking use included cigarettes. She has a 5.00 pack-year smoking history. She has never used smokeless tobacco. She reports current alcohol use. She reports that she does not use drugs.   Family History:  The patient's family history includes Breast cancer in her maternal aunt; Breast cancer (age of onset: 67) in her mother; Lung cancer in her paternal grandfather; Pulmonary embolism in her father; Sudden death in her mother.  ROS:  Please see the history of present illness.    All other systems are reviewed and otherwise negative.   PHYSICAL EXAM:  VS:  There were no vitals taken for this visit. BMI: There is no height or weight on file to calculate BMI. Well nourished, well developed, in no acute distress HEENT: normocephalic, atraumatic Neck: no JVD, carotid bruits or masses Cardiac:  *** RRR; no significant murmurs, no rubs, or gallops Lungs:  *** CTA b/l, no wheezing, rhonchi or rales Abd: soft, nontender MS: no deformity or *** atrophy Ext: *** no edema Skin: warm and dry, no rash Neuro:  No gross deficits appreciated Psych: euthymic mood, full affect  *** PPM site is stable, no tethering or discomfort   EKG:  Not done today  Device interrogation done today and reviewed by myself:  ***  Recent Labs: 09/10/2020: ALT 9; BUN 19; Creatinine 1.11; Hemoglobin 12.1; Platelet Count 150; Potassium 4.6; Sodium 141  No results found for requested labs within last 8760 hours.   CrCl cannot  be calculated (Patient's most recent lab result is older than the maximum 21 days allowed.).   Wt Readings from Last 3 Encounters:  09/10/20 111 lb 4.8 oz (50.5 kg)  06/11/20 116 lb (52.6 kg)  02/09/20 118 lb 13.3 oz (53.9 kg)     Other studies reviewed: Additional studies/records reviewed today include: summarized above  ASSESSMENT AND PLAN:  1. PPM     ***  2. HTN     ***  3. HLD     ***  Disposition: F/u with ***  Current medicines are reviewed at length with the patient today.  The patient did not have any concerns regarding medicines.  Venetia Night, PA-C 01/23/2021 1:12 PM     [place] [address] [phone] (office)  [phone] (fax)

## 2021-01-24 ENCOUNTER — Ambulatory Visit: Payer: Medicare HMO | Admitting: Family

## 2021-01-25 ENCOUNTER — Encounter: Payer: Medicare HMO | Admitting: Physician Assistant

## 2021-01-27 ENCOUNTER — Other Ambulatory Visit: Payer: Self-pay | Admitting: Oncology

## 2021-01-27 ENCOUNTER — Encounter: Payer: Self-pay | Admitting: Oncology

## 2021-02-02 ENCOUNTER — Encounter: Payer: Medicare HMO | Admitting: Student

## 2021-02-09 NOTE — Progress Notes (Unsigned)
Cardiology Office Note Date:  02/10/2021  Patient ID:  Danielle [name] [birth year], MRN [id number] PCP:  Biagio Borg, MD  Cardiologist/Electrophysiologist: Dr. Caryl Comes    Chief Complaint:  annual visit  History of Present Illness: Danielle [name] is a 85 y.o. female with history of breast cancer (R), following with oncology, HTN, HLD, hypothyroidism, CHB w/PPM.  She comes in today to be seen for Dr. Caryl Comes, last seen by him Feb 2020, planned for hip surgery (cleared), mentioned A lead threshold was increased.  She saw Danielle [name], [place] Feb 2021, doing well, mentioned mild fluttering, no notable device findings, no changes made. She mentioned difficulty with transmissions, though worked well in the office.   TODAY She is doing quite well, unfortunately though her husband dies a month or so ago (though was suffering and is better she says) though she has taken up his chores about the home and farm. She feels physically strong with no exertional intolerances. No CP, palpitations or cardiac awareness No dizzy spells, near syncope or syncope. She reports feeling quite good with no concerns  Device information MDT dual chamber PPM implanted 1997, gen change 07/16/12   Past Medical History:  Diagnosis Date  . ALLERGIC RHINITIS 01/09/2008  . ANEMIA-NOS 01/09/2008   pt doesn't remember  . ANXIETY 01/09/2008  . ASTHMATIC BRONCHITIS, ACUTE 10/20/2008  . AV BLOCK, COMPLETE 12/16/2009  . Breast cancer, right ([place])   . DEPRESSION 01/09/2008  . Dizziness and giddiness 02/25/2010  . DVT, HX OF 01/09/2008   after delivery of 4 th child  . Genetic testing 06/04/2017   Ms. Chervenak underwent genetic counseling and testing for hereditary cancer syndromes on 05/24/2017. Her results were negative for mutations in all 46 genes analyzed by [employer]'s 46-gene Common Hereditary Cancers Panel. Genes analyzed include: APC, ATM, AXIN2, BARD1, BMPR1A, BRCA1, BRCA2, BRIP1, CDH1, CDKN2A, CHEK2, CTNNA1,  DICER1, EPCAM, GREM1, HOXB13, KIT, MEN1, MLH1, MSH2, MSH3, MSH6, MUTYH, NBN  . GERD 01/09/2008  . GLUCOSE INTOLERANCE 01/09/2008  . History of kidney stones   . HYPERLIPIDEMIA 01/09/2008  . HYPERTENSION 01/09/2008  . HYPOTHYROIDISM 01/09/2008  . Impaired glucose tolerance 07/28/2011  . INSOMNIA-SLEEP DISORDER-UNSPEC 06/15/2009  . NEPHROLITHIASIS, HX OF 01/09/2008  . OSTEOARTHRITIS, CERVICAL SPINE 02/25/2010  . Presence of permanent cardiac pacemaker    medtronic, pacemaker dependent  . Urine incontinence     Past Surgical History:  Procedure Laterality Date  . BREAST BIOPSY Right 09/20/2016   malignant  . ERCP W/ SPHICTEROTOMY     pt denies  . HEMORRHOID SURGERY     pt denies  . IR URETERAL STENT RIGHT NEW ACCESS W/O SEP NEPHROSTOMY CATH  02/09/2020  . MASTECTOMY Right 01/11/2017  . MASTECTOMY W/ SENTINEL NODE BIOPSY Right 01/11/2017   Procedure: RIGHT MASTECTOMY WITH SENTINEL LYMPH NODE BIOPSY;  Surgeon: [name] Klein, MD;  Location: [facility];  Service: General;  Laterality: Right;  . NEPHROLITHOTOMY Right 02/09/2020   Procedure: NEPHROLITHOTOMY PERCUTANEOUS;  Surgeon: Kathie Rhodes, MD;  Location: [facility];  Service: Urology;  Laterality: Right;  . PACEMAKER PLACEMENT Left    MedTronic EnPulse [id number] no heartbeat without pacemaker  . PERMANENT PACEMAKER GENERATOR CHANGE N/A 07/16/2012   Procedure: PERMANENT PACEMAKER GENERATOR CHANGE;  Surgeon: Deboraha Sprang, MD;  [facility] C[facility];  Service: Cardiovascular;  Laterality: N/A;  . POLYPECTOMY     Uterine  . TOTAL HIP ARTHROPLASTY Right 04/28/2019   Procedure: RIGHT TOTAL HIP ARTHROPLASTY ANTERIOR APPROACH;  Surgeon: Leandrew Koyanagi, MD;  Location: [facility];  Service: Orthopedics;  Laterality: Right;    Current Outpatient Medications  Medication Sig Dispense Refill  . atenolol (TENORMIN) 50 MG tablet Take 1 tablet (50 mg total) by mouth daily. 90 tablet 3  . atorvastatin (LIPITOR) 10 MG tablet TAKE 1 TABLET(10 MG) BY MOUTH DAILY Annual appt  due in Oct must see provider for future refills 90 tablet 3  . azelastine (ASTELIN) 0.1 % nasal spray Place 1 spray into both nostrils as needed (Summer Allergies).     . calcium-vitamin D (OSCAL WITH D) 500-200 MG-UNIT tablet Take 1 tablet by mouth daily with breakfast.    . enalapril (VASOTEC) 10 MG tablet Take 1 tablet (10 mg total) by mouth daily. Annual appt due in Oct must see provider for future refills 90 tablet 3  . levothyroxine (SYNTHROID) 125 MCG tablet Take 1 tablet (125 mcg total) by mouth daily. 90 tablet 3  . traMADol (ULTRAM) 50 MG tablet Take 1 tablet (50 mg total) by mouth every 6 (six) hours as needed. 10 tablet 0   No current facility-administered medications for this visit.    Allergies:   Codeine and Oxycodone   Social History:  The patient  reports that she quit smoking about 49 years ago. Her smoking use included cigarettes. She has a 5.00 pack-year smoking history. She has never used smokeless tobacco. She reports current alcohol use. She reports that she does not use drugs.   Family History:  The patient's family history includes Breast cancer in her maternal aunt; Breast cancer (age of onset: 44) in her mother; Lung cancer in her paternal grandfather; Pulmonary embolism in her father; Sudden death in her mother.  ROS:  Please see the history of present illness.    All other systems are reviewed and otherwise negative.   PHYSICAL EXAM:  VS:  BP 128/68   Pulse 67   Ht $R'5\' 4"'oA$  (1.626 m)   Wt 116 lb 6.4 oz (52.8 kg)   SpO2 99%   BMI 19.98 kg/m  BMI: Body mass index is 19.98 kg/m. Well nourished, well developed, in no acute distress HEENT: normocephalic, atraumatic Neck: no JVD, carotid bruits or masses Cardiac:  RRR; no significant murmurs, no rubs, or gallops Lungs:  CTA b/l, no wheezing, rhonchi or rales Abd: soft, nontender MS: no deformity, age appropriate atrophy Ext: no edema Skin: warm and dry, no rash Neuro:  No gross deficits appreciated Psych:  euthymic mood, full affect  PPM site is stable, no tethering or discomfort, she is quite thin, no signs of errosion   EKG:  Not done today  Device interrogation done today and reviewed by myself:  Battery and lead measurements are stable Est battery 20 months 2 HVR episodes 11beats on 01/30/20 6 beats 08/09/20   10/03/2005 Stress myoview EF 69% Normal test  Recent Labs: 09/10/2020: ALT 9; BUN 19; Creatinine 1.11; Hemoglobin 12.1; Platelet Count 150; Potassium 4.6; Sodium 141  No results found for requested labs within last 8760 hours.   CrCl cannot be calculated (Patient's most recent lab result is older than the maximum 21 days allowed.).   Wt Readings from Last 3 Encounters:  02/10/21 116 lb 6.4 oz (52.8 kg)  09/10/20 111 lb 4.8 oz (50.5 kg)  06/11/20 116 lb (52.6 kg)     Other studies reviewed: Additional studies/records reviewed today include: summarized above  ASSESSMENT AND PLAN:  1. PPM     Chronically elevated A lead thresholds, stable in comparison to prior in  clinic checks     No programming changes made     She continues to have difficulty sending remotes, device clinic ordered her a new transmitter today  Discussed likely to need battery change within 2 years  2. HTN     Looks good  3. HLD     Not addressed today    Disposition: F/u with remotes as usual and in clinic in 1 year, sooner if needed  Current medicines are reviewed at length with the patient today.  The patient did not have any concerns regarding medicines.  Venetia Night, PA-C 02/10/2021 4:38 PM     [address] [place] [address] [phone] (office)  [phone] (fax)

## 2021-02-10 ENCOUNTER — Other Ambulatory Visit: Payer: Self-pay

## 2021-02-10 ENCOUNTER — Telehealth: Payer: Self-pay

## 2021-02-10 ENCOUNTER — Ambulatory Visit: Payer: Medicare HMO | Admitting: Physician Assistant

## 2021-02-10 ENCOUNTER — Encounter: Payer: Self-pay | Admitting: Physician Assistant

## 2021-02-10 VITALS — BP 128/68 | HR 67 | Ht 64.0 in | Wt 116.4 lb

## 2021-02-10 DIAGNOSIS — Z950 Presence of cardiac pacemaker: Secondary | ICD-10-CM

## 2021-02-10 DIAGNOSIS — I442 Atrioventricular block, complete: Secondary | ICD-10-CM | POA: Diagnosis not present

## 2021-02-10 DIAGNOSIS — I10 Essential (primary) hypertension: Secondary | ICD-10-CM

## 2021-02-10 LAB — CUP PACEART INCLINIC DEVICE CHECK
Battery Impedance: 2225 Ohm
Battery Remaining Longevity: 20 mo
Battery Voltage: 2.7 V
Brady Statistic AP VP Percent: 66 %
Brady Statistic AP VS Percent: 0 %
Brady Statistic AS VP Percent: 34 %
Brady Statistic AS VS Percent: 0 %
Date Time Interrogation Session: 20220324160627
Implantable Lead Implant Date: 19970117
Implantable Lead Implant Date: 19970117
Implantable Lead Location: 753859
Implantable Lead Location: 753860
Implantable Lead Model: 4068
Implantable Lead Model: 4068
Implantable Pulse Generator Implant Date: 20130827
Lead Channel Impedance Value: 695 Ohm
Lead Channel Impedance Value: 710 Ohm
Lead Channel Pacing Threshold Amplitude: 1.5 V
Lead Channel Pacing Threshold Amplitude: 1.75 V
Lead Channel Pacing Threshold Amplitude: 1.75 V
Lead Channel Pacing Threshold Amplitude: 2.5 V
Lead Channel Pacing Threshold Pulse Width: 0.4 ms
Lead Channel Pacing Threshold Pulse Width: 0.4 ms
Lead Channel Pacing Threshold Pulse Width: 0.4 ms
Lead Channel Pacing Threshold Pulse Width: 1.25 ms
Lead Channel Sensing Intrinsic Amplitude: 2 mV
Lead Channel Setting Pacing Amplitude: 2.5 V
Lead Channel Setting Pacing Amplitude: 3.5 V
Lead Channel Setting Pacing Pulse Width: 0.4 ms
Lead Channel Setting Sensing Sensitivity: 5.6 mV

## 2021-02-10 NOTE — Telephone Encounter (Signed)
I spoke with [employer] and they are sending her a new monitor in 7-10 business days.

## 2021-02-10 NOTE — Patient Instructions (Signed)
Medication Instructions:   Your physician recommends that you continue on your current medications as directed. Please refer to the Current Medication list given to you today.  *If you need a refill on your cardiac medications before your next appointment, please call your pharmacy*   Lab Work: [place]   If you have labs (blood work) drawn today and your tests are completely normal, you will receive your results only by: Marland Kitchen MyChart Message (if you have MyChart) OR . A paper copy in the mail If you have any lab test that is abnormal or we need to change your treatment, we will call you to review the results.   Testing/Procedures: NONE ORDERED  TODAY   Follow-Up: At [facility], you and your health needs are our priority.  As part of our continuing mission to provide you with exceptional heart care, we have created designated Provider Care Teams.  These Care Teams include your primary Cardiologist (physician) and Advanced Practice Providers (APPs -  Physician Assistants and Nurse Practitioners) who all work together to provide you with the care you need, when you need it.  We recommend signing up for the patient portal called "MyChart".  Sign up information is provided on this After Visit Summary.  MyChart is used to connect with patients for Virtual Visits (Telemedicine).  Patients are able to view lab/test results, encounter notes, upcoming appointments, etc.  Non-urgent messages can be sent to your provider as well.   To learn more about what you can do with MyChart, go to [email or web address].    Your next appointment:   1 year(s)  The format for your next appointment:   In Person  Provider:   Virl Axe, MD   Other Instructions

## 2021-03-09 ENCOUNTER — Ambulatory Visit: Payer: Medicare HMO | Admitting: Internal Medicine

## 2021-03-11 ENCOUNTER — Ambulatory Visit: Payer: Medicare HMO | Admitting: Orthopaedic Surgery

## 2021-03-15 ENCOUNTER — Ambulatory Visit (INDEPENDENT_AMBULATORY_CARE_PROVIDER_SITE_OTHER): Payer: Medicare HMO

## 2021-03-15 ENCOUNTER — Encounter: Payer: Self-pay | Admitting: Orthopaedic Surgery

## 2021-03-15 ENCOUNTER — Ambulatory Visit: Payer: Self-pay

## 2021-03-15 ENCOUNTER — Ambulatory Visit: Payer: Medicare HMO | Admitting: Orthopaedic Surgery

## 2021-03-15 DIAGNOSIS — M79605 Pain in left leg: Secondary | ICD-10-CM

## 2021-03-15 DIAGNOSIS — M25552 Pain in left hip: Secondary | ICD-10-CM

## 2021-03-15 DIAGNOSIS — M5416 Radiculopathy, lumbar region: Secondary | ICD-10-CM

## 2021-03-15 MED ORDER — PREDNISONE 10 MG (21) PO TBPK: ORAL_TABLET | ORAL | 0 refills | Status: DC

## 2021-03-15 NOTE — Progress Notes (Signed)
 Office Visit Note   Patient: Danielle [name]           Date of Birth: 10/06/1936           MRN: [id number] Visit Date: 03/15/2021              Requested by: John, James W, MD [address] [place],  [place] [address] PCP: John, James W, MD   Assessment & Plan: Visit Diagnoses:  1. Pain in left hip   2. Lumbar radiculopathy   3. Pain in left leg     Plan: Impression is left calf pain.  Based on my evaluation I think it is probably coming from her back.  She does have a fair amount of degenerative scoliosis.  We will order a Doppler to rule out DVT.  Prednisone Dosepak was prescribed today.  We will follow-up with [place] after the Doppler.  She has been instructed to follow-up in a couple weeks if she does not feel any improvement from the prednisone.  Follow-Up Instructions: Return if symptoms worsen or fail to improve.   Orders:  Orders Placed This Encounter  Procedures  . XR Lumbar Spine 2-3 Views  . XR Tibia/Fibula Left  . VAS US LOWER EXTREMITY VENOUS (DVT)   Meds ordered this encounter  Medications  . predniSONE (STERAPRED UNI-PAK 21 TAB) 10 MG (21) TBPK tablet    Sig: Take as directed    Dispense:  21 tablet    Refill:  0      Procedures: No procedures performed   Clinical Data: No additional findings.   Subjective: Chief Complaint  Patient presents with  . Left Hip - Pain    Danielle [name] is a pleasant 85-year-old female comes in for evaluation of several weeks of left calf pain.  This is worse in the morning and gets better with increased walking and moving and activity.  She does feel numbness and tingling and radiation of pain from her back.  She is concerned that it may be a blood clot because she had a DVT after pregnancy about 50 years ago.  The left leg is always stay just a little bit more swollen than the right.  Denies any groin pain.   Review of Systems  Constitutional: Negative.   HENT: Negative.   Eyes: Negative.   Respiratory:  Negative.   Cardiovascular: Negative.   Endocrine: Negative.   Musculoskeletal: Negative.   Neurological: Negative.   Hematological: Negative.   Psychiatric/Behavioral: Negative.   All other systems reviewed and are negative.    Objective: Vital Signs: There were no vitals taken for this visit.  Physical Exam Vitals and nursing note reviewed.  Constitutional:      Appearance: She is well-developed.  HENT:     Head: Normocephalic and atraumatic.  Pulmonary:     Effort: Pulmonary effort is normal.  Abdominal:     Palpations: Abdomen is soft.  Musculoskeletal:     Cervical back: Neck supple.  Skin:    General: Skin is warm.     Capillary Refill: Capillary refill takes less than 2 seconds.  Neurological:     Mental Status: She is alert and oriented to person, place, and time.  Psychiatric:        Behavior: Behavior normal.        Thought Content: Thought content normal.        Judgment: Judgment normal.     Ortho Exam Left calf does show some pitting edema and overall the   circumference is slightly larger than the right.  Slight calf tenderness to palpation.  Negative Homans' sign.  Negative sciatic tension signs.  Lumbar spine is mildly tender to palpation.  Hip exam is unremarkable. Specialty Comments:  No specialty comments available.  Imaging: XR Tibia/Fibula Left  Result Date: 03/15/2021 No acute or structural abnormalities.  XR Lumbar Spine 2-3 Views  Result Date: 03/15/2021 Degenerative scoliosis of the lumbar spine.  No acute abnormalities.    PMFS History: Patient Active Problem List   Diagnosis Date Noted  . Arthritis 06/11/2020  . Trigger middle finger 06/11/2020  . Renal calculus, right 02/09/2020  . Cervicalgia 11/04/2019  . Status post total hip replacement, right 11/04/2019  . Urinary incontinence 10/09/2019  . History of total hip replacement 04/28/2019  . Degenerative joint disease of right hip 12/17/2018  . Genetic testing 06/04/2017  .  Malignant neoplasm of overlapping sites of right breast in female, estrogen receptor negative ([place]) 04/24/2017  . Chronic venous insufficiency 03/15/2017  . Subungual hematoma of foot 03/15/2017  . Dyspepsia 03/07/2017  . Nausea & vomiting 02/22/2017  . Cough 02/22/2017  . Hematuria 02/22/2017  . S/P right mastectomy 01/11/2017  . Breast cancer of upper-inner quadrant of right female breast ([place]) 10/06/2016  . Right hip pain 07/18/2016  . Olecranon bursitis of left elbow 05/05/2015  . Alopecia 01/21/2015  . Left leg swelling 01/21/2015  . Cervical radiculopathy 07/11/2013  . Encounter for well adult exam with abnormal findings 08/03/2012  . Dizziness 08/03/2012  . Rash 03/26/2012  . Pacemaker-medtronic 01/23/2012  . Impaired glucose tolerance 07/28/2011  . OSTEOARTHRITIS, CERVICAL SPINE 02/25/2010  . Dizziness and giddiness 02/25/2010  . AV BLOCK, COMPLETE 12/16/2009  . INSOMNIA-SLEEP DISORDER-UNSPEC 06/15/2009  . Hypothyroidism 01/09/2008  . Hyperlipidemia 01/09/2008  . ANEMIA-NOS 01/09/2008  . ANXIETY 01/09/2008  . Depression 01/09/2008  . Essential hypertension 01/09/2008  . ALLERGIC RHINITIS 01/09/2008  . GERD 01/09/2008  . DVT, HX OF 01/09/2008  . NEPHROLITHIASIS, HX OF 01/09/2008   Past Medical History:  Diagnosis Date  . ALLERGIC RHINITIS 01/09/2008  . ANEMIA-NOS 01/09/2008   pt doesn't remember  . ANXIETY 01/09/2008  . ASTHMATIC BRONCHITIS, ACUTE 10/20/2008  . AV BLOCK, COMPLETE 12/16/2009  . Breast cancer, right ([place])   . DEPRESSION 01/09/2008  . Dizziness and giddiness 02/25/2010  . DVT, HX OF 01/09/2008   after delivery of 4 th child  . Genetic testing 06/04/2017   Ms. Schlachter underwent genetic counseling and testing for hereditary cancer syndromes on 05/24/2017. Her results were negative for mutations in all 46 genes analyzed by [employer]'s 46-gene Common Hereditary Cancers Panel. Genes analyzed include: APC, ATM, AXIN2, BARD1, BMPR1A, BRCA1, BRCA2, BRIP1, CDH1,  CDKN2A, CHEK2, CTNNA1, DICER1, EPCAM, GREM1, HOXB13, KIT, MEN1, MLH1, MSH2, MSH3, MSH6, MUTYH, NBN  . GERD 01/09/2008  . GLUCOSE INTOLERANCE 01/09/2008  . History of kidney stones   . HYPERLIPIDEMIA 01/09/2008  . HYPERTENSION 01/09/2008  . HYPOTHYROIDISM 01/09/2008  . Impaired glucose tolerance 07/28/2011  . INSOMNIA-SLEEP DISORDER-UNSPEC 06/15/2009  . NEPHROLITHIASIS, HX OF 01/09/2008  . OSTEOARTHRITIS, CERVICAL SPINE 02/25/2010  . Presence of permanent cardiac pacemaker    medtronic, pacemaker dependent  . Urine incontinence     Family History  Problem Relation Age of Onset  . Sudden death Mother        d.33s  . Breast cancer Mother 61  . Pulmonary embolism Father        Possible  . Lung cancer Paternal Grandfather  history of smoking  . Breast cancer Maternal Aunt        d.78s    Past Surgical History:  Procedure Laterality Date  . BREAST BIOPSY Right 09/20/2016   malignant  . ERCP W/ SPHICTEROTOMY     pt denies  . HEMORRHOID SURGERY     pt denies  . IR URETERAL STENT RIGHT NEW ACCESS W/O SEP NEPHROSTOMY CATH  02/09/2020  . MASTECTOMY Right 01/11/2017  . MASTECTOMY W/ SENTINEL NODE BIOPSY Right 01/11/2017   Procedure: RIGHT MASTECTOMY WITH SENTINEL LYMPH NODE BIOPSY;  Surgeon: Faera Byerly, MD;  Location[facility];  Service: General;  Laterality: Right;  . NEPHROLITHOTOMY Right 02/09/2020   Procedure: NEPHROLITHOTOMY PERCUTANEOUS;  Surgeon: Ottelin, Mark, MD;  Location: [facility];  Service: Urology;  Laterality: Right;  . PACEMAKER PLACEMENT Left    [employer] EnPulse E2DR01--06 no heartbeat without pacemaker  . PERMANENT PACEMAKER GENERATOR CHANGE N/A 07/16/2012   Procedure: PERMANENT PACEMAKER GENERATOR CHANGE;  Surgeon: Steven C Klein, MD;  Location: [facility];  Service: Cardiovascular;  Laterality: N/A;  . POLYPECTOMY     Uterine  . TOTAL HIP ARTHROPLASTY Right 04/28/2019   Procedure: RIGHT TOTAL HIP ARTHROPLASTY ANTERIOR APPROACH;  Surgeon: ,  M, MD;  Location: [facility];   Service: Orthopedics;  Laterality: Right;   Social History   Occupational History  . Not on file  Tobacco Use  . Smoking status: Former Smoker    Packs/day: 0.20    Years: 25.00    Pack years: 5.00    Types: Cigarettes    Quit date: 11/21/1971    Years since quitting: 49.3  . Smokeless tobacco: Never Used  Vaping Use  . Vaping Use: Never used  Substance and Sexual Activity  . Alcohol use: Yes    Comment: wine  . Drug use: No  . Sexual activity: Not on file       

## 2021-04-21 ENCOUNTER — Encounter: Payer: Self-pay | Admitting: Internal Medicine

## 2021-04-21 ENCOUNTER — Ambulatory Visit: Payer: Medicare HMO | Admitting: Internal Medicine

## 2021-04-21 ENCOUNTER — Other Ambulatory Visit: Payer: Self-pay

## 2021-04-21 DIAGNOSIS — M545 Low back pain, unspecified: Secondary | ICD-10-CM | POA: Insufficient documentation

## 2021-04-21 DIAGNOSIS — M5442 Lumbago with sciatica, left side: Secondary | ICD-10-CM

## 2021-04-21 DIAGNOSIS — R7302 Impaired glucose tolerance (oral): Secondary | ICD-10-CM | POA: Diagnosis not present

## 2021-04-21 DIAGNOSIS — I10 Essential (primary) hypertension: Secondary | ICD-10-CM | POA: Diagnosis not present

## 2021-04-21 HISTORY — DX: Low back pain, unspecified: M54.50

## 2021-04-21 MED ORDER — PREDNISONE 10 MG (21) PO TBPK: ORAL_TABLET | ORAL | 0 refills | Status: DC

## 2021-04-21 MED ORDER — GABAPENTIN 100 MG PO CAPS: 100.0000 mg | ORAL_CAPSULE | Freq: Three times a day (TID) | ORAL | 3 refills | Status: DC

## 2021-04-21 MED ORDER — HYDROCODONE-ACETAMINOPHEN 5-325 MG PO TABS: 1.0000 | ORAL_TABLET | Freq: Four times a day (QID) | ORAL | 0 refills | Status: DC | PRN

## 2021-04-21 NOTE — Assessment & Plan Note (Signed)
Acute x 4 days, severe at times but no falls; for hydrocodone prn, gabapentin 100 tid prn, and predpac asd, and f/u 1 wk; consider MRI if not improved

## 2021-04-21 NOTE — Assessment & Plan Note (Signed)
BP Readings from Last 3 Encounters:  04/21/21 (!) 160/78  02/10/21 128/68  09/10/20 (!) 146/78   Unocntrolled, likely reactive, pt to continue same medical treatment - vasotec

## 2021-04-21 NOTE — Progress Notes (Signed)
Patient ID: Danielle [name], female   DOB: 06/13/1936, 85 y.o.   MRN: [id number]        Chief Complaint: left low back and leg pain       HPI:  Danielle [name] is a 85 y.o. female here with acute onset left low back pain and leg pain x 4 days, mod to severe and worst in the AM to get out of bed, but actually better later in the AM and during day with being upright and more walking; pain is otherwise constant, not better or worse with anything, and not associated with LLE numbness or weakness or falls.  Pt has no bowel or bladder change, fever, wt loss, gait change or falls.  Pt denies chest pain, increased sob or doe, wheezing, orthopnea, PND, increased LE swelling, palpitations, dizziness or syncope.   Pt denies polydipsia, polyuria, or new focal neuro s/s.     Currently lives alone, husband recently died.  Was seen per Dr Erlinda Hong with neg left hip evaluation per pt.       Wt Readings from Last 3 Encounters:  04/21/21 114 lb 3.2 oz (51.8 kg)  02/10/21 116 lb 6.4 oz (52.8 kg)  09/10/20 111 lb 4.8 oz (50.5 kg)   BP Readings from Last 3 Encounters:  04/21/21 (!) 160/78  02/10/21 128/68  09/10/20 (!) 146/78         Past Medical History:  Diagnosis Date  . ALLERGIC RHINITIS 01/09/2008  . ANEMIA-NOS 01/09/2008   pt doesn't remember  . ANXIETY 01/09/2008  . ASTHMATIC BRONCHITIS, ACUTE 10/20/2008  . AV BLOCK, COMPLETE 12/16/2009  . Breast cancer, right ([place])   . DEPRESSION 01/09/2008  . Dizziness and giddiness 02/25/2010  . DVT, HX OF 01/09/2008   after delivery of 4 th child  . Genetic testing 06/04/2017   Ms. Wisser underwent genetic counseling and testing for hereditary cancer syndromes on 05/24/2017. Her results were negative for mutations in all 46 genes analyzed by [facility]'s 46-gene Common Hereditary Cancers Panel. Genes analyzed include: APC, ATM, AXIN2, BARD1, BMPR1A, BRCA1, BRCA2, BRIP1, CDH1, CDKN2A, CHEK2, CTNNA1, DICER1, EPCAM, GREM1, HOXB13, KIT, MEN1, MLH1, MSH2, MSH3, MSH6, MUTYH, NBN   . GERD 01/09/2008  . GLUCOSE INTOLERANCE 01/09/2008  . History of kidney stones   . HYPERLIPIDEMIA 01/09/2008  . HYPERTENSION 01/09/2008  . HYPOTHYROIDISM 01/09/2008  . Impaired glucose tolerance 07/28/2011  . INSOMNIA-SLEEP DISORDER-UNSPEC 06/15/2009  . NEPHROLITHIASIS, HX OF 01/09/2008  . OSTEOARTHRITIS, CERVICAL SPINE 02/25/2010  . Presence of permanent cardiac pacemaker    medtronic, pacemaker dependent  . Urine incontinence    Past Surgical History:  Procedure Laterality Date  . BREAST BIOPSY Right 09/20/2016   malignant  . ERCP W/ SPHICTEROTOMY     pt denies  . HEMORRHOID SURGERY     pt denies  . IR URETERAL STENT RIGHT NEW ACCESS W/O SEP NEPHROSTOMY CATH  02/09/2020  . MASTECTOMY Right 01/11/2017  . MASTECTOMY W/ SENTINEL NODE BIOPSY Right 01/11/2017   Procedure: RIGHT MASTECTOMY WITH SENTINEL LYMPH NODE BIOPSY;  Surgeon: [name] Klein, MD;  Location: [facility];  Service: General;  Laterality: Right;  . NEPHROLITHOTOMY Right 02/09/2020   Procedure: NEPHROLITHOTOMY PERCUTANEOUS;  Surgeon: Kathie Rhodes, MD;  Location: [facility];  Service: Urology;  Laterality: Right;  . PACEMAKER PLACEMENT Left    MedTronic EnPulse E2DR01--06 no heartbeat without pacemaker  . PERMANENT PACEMAKER GENERATOR CHANGE N/A 07/16/2012   Procedure: PERMANENT PACEMAKER GENERATOR CHANGE;  Surgeon: Deboraha Sprang, MD;  Location: [facility] CATH  LAB;  Service: Cardiovascular;  Laterality: N/A;  . POLYPECTOMY     Uterine  . TOTAL HIP ARTHROPLASTY Right 04/28/2019   Procedure: RIGHT TOTAL HIP ARTHROPLASTY ANTERIOR APPROACH;  Surgeon: Tarry Kos, MD;  Location: [facility];  Service: Orthopedics;  Laterality: Right;    reports that she quit smoking about 49 years ago. Her smoking use included cigarettes. She has a 5.00 pack-year smoking history. She has never used smokeless tobacco. She reports current alcohol use. She reports that she does not use drugs. family history includes Breast cancer in her maternal aunt; Breast cancer (age of  onset: 74) in her mother; Lung cancer in her paternal grandfather; Pulmonary embolism in her father; Sudden death in her mother. Allergies  Allergen Reactions  . Codeine Nausea And Vomiting  . Oxycodone Nausea And Vomiting   Current Outpatient Medications on File Prior to Visit  Medication Sig Dispense Refill  . atenolol (TENORMIN) 50 MG tablet Take 1 tablet (50 mg total) by mouth daily. 90 tablet 3  . atorvastatin (LIPITOR) 10 MG tablet TAKE 1 TABLET(10 MG) BY MOUTH DAILY Annual appt due in Oct must see provider for future refills 90 tablet 3  . azelastine (ASTELIN) 0.1 % nasal spray Place 1 spray into both nostrils as needed (Summer Allergies).     . calcium-vitamin D (OSCAL WITH D) 500-200 MG-UNIT tablet Take 1 tablet by mouth daily with breakfast.    . enalapril (VASOTEC) 10 MG tablet Take 1 tablet (10 mg total) by mouth daily. Annual appt due in Oct must see provider for future refills 90 tablet 3  . levothyroxine (SYNTHROID) 125 MCG tablet Take 1 tablet (125 mcg total) by mouth daily. 90 tablet 3  . traMADol (ULTRAM) 50 MG tablet Take 1 tablet (50 mg total) by mouth every 6 (six) hours as needed. 10 tablet 0   No current facility-administered medications on file prior to visit.        ROS:  All others reviewed and negative.  Objective        PE:  BP (!) 160/78 (BP Location: Left Arm, Patient Position: Standing, Cuff Size: Normal)   Pulse 74   Temp 98.1 F (36.7 C) (Oral)   Ht 5\' 4"  (1.626 m)   Wt 114 lb 3.2 oz (51.8 kg)   SpO2 98%   BMI 19.60 kg/m                 Constitutional: Pt appears in NAD               HENT: Head: NCAT.                Right Ear: External ear normal.                 Left Ear: External ear normal.                Eyes: . Pupils are equal, round, and reactive to light. Conjunctivae and EOM are normal               Nose: without d/c or deformity               Neck: Neck supple. Gross normal ROM               Cardiovascular: Normal rate and regular  rhythm.                 Pulmonary/Chest: Effort normal and breath sounds without rales or wheezing.  Abd:  Soft, NT, ND, + BS, no organomegaly               Neurological: Pt is alert. At baseline orientation, motor grossly intact; spine nontender               Skin: Skin is warm. No rashes, no other new lesions, LE edema - none               Psychiatric: Pt behavior is normal without agitation   Micro: none  Cardiac tracings I have personally interpreted today:  none  Pertinent Radiological findings (summarize): none   Lab Results  Component Value Date   WBC 5.1 09/10/2020   HGB 12.1 09/10/2020   HCT 37.4 09/10/2020   PLT 150 09/10/2020   GLUCOSE 69 (L) 09/10/2020   CHOL 194 09/11/2019   TRIG 95.0 09/11/2019   HDL 75.30 09/11/2019   LDLDIRECT 128.0 05/11/2016   LDLCALC 100 (H) 09/11/2019   ALT 9 09/10/2020   AST 20 09/10/2020   NA 141 09/10/2020   K 4.6 09/10/2020   CL 105 09/10/2020   CREATININE 1.11 (H) 09/10/2020   BUN 19 09/10/2020   CO2 30 09/10/2020   TSH 13.41 (H) 09/11/2019   INR 1.0 02/09/2020   HGBA1C 6.1 09/11/2019   Assessment/Plan:  JAMIRA BARFUSS is a 85 y.o. White or Caucasian [1] female with  has a past medical history of ALLERGIC RHINITIS (01/09/2008), ANEMIA-NOS (01/09/2008), ANXIETY (01/09/2008), ASTHMATIC BRONCHITIS, ACUTE (10/20/2008), AV BLOCK, COMPLETE (12/16/2009), Breast cancer, right ([place]), DEPRESSION (01/09/2008), Dizziness and giddiness (02/25/2010), DVT, HX OF (01/09/2008), Genetic testing (06/04/2017), GERD (01/09/2008), GLUCOSE INTOLERANCE (01/09/2008), History of kidney stones, HYPERLIPIDEMIA (01/09/2008), HYPERTENSION (01/09/2008), HYPOTHYROIDISM (01/09/2008), Impaired glucose tolerance (07/28/2011), INSOMNIA-SLEEP DISORDER-UNSPEC (06/15/2009), NEPHROLITHIASIS, HX OF (01/09/2008), OSTEOARTHRITIS, CERVICAL SPINE (02/25/2010), Presence of permanent cardiac pacemaker, and Urine incontinence.  Low back pain Acute x 4 days, severe at times but no  falls; for hydrocodone prn, gabapentin 100 tid prn, and predpac asd, and f/u 1 wk; consider MRI if not improved  Essential hypertension BP Readings from Last 3 Encounters:  04/21/21 (!) 160/78  02/10/21 128/68  09/10/20 (!) 146/78   Unocntrolled, likely reactive, pt to continue same medical treatment - vasotec   Impaired glucose tolerance Lab Results  Component Value Date   HGBA1C 6.1 09/11/2019   Stable, pt to continue current medical treatment  - diet   Followup: Return in about 1 week (around 04/28/2021).  Cathlean Cower, MD 04/21/2021 7:47 PM [facility]

## 2021-04-21 NOTE — Assessment & Plan Note (Signed)
Lab Results  Component Value Date   HGBA1C 6.1 09/11/2019   Stable, pt to continue current medical treatment  - diet

## 2021-04-21 NOTE — Patient Instructions (Signed)
Ok to take the hydrocodone every 6 hrs as needed, but it might be best to take it at least once per day about 30 min prior to getting out of bed (to help the pain with getting out of bed)  Please take all new medication as prescribed - the gabapentin 100 mg up to three times per day (for nerve pain only - ok to stop this if it makes you too sleepy)  Please take all new medication as prescribed - the prednisone  Please continue all other medications as before, and refills have been done if requested.  Please have the pharmacy call with any other refills you may need.  Please keep your appointments with your specialists as you may have planned  Please make an Appointment to return in 1 week

## 2021-05-09 ENCOUNTER — Ambulatory Visit: Payer: Medicare HMO

## 2021-07-29 ENCOUNTER — Other Ambulatory Visit: Payer: Self-pay

## 2021-08-01 ENCOUNTER — Ambulatory Visit: Payer: Medicare HMO | Admitting: Internal Medicine

## 2021-08-26 ENCOUNTER — Ambulatory Visit (INDEPENDENT_AMBULATORY_CARE_PROVIDER_SITE_OTHER): Payer: Medicare HMO | Admitting: Internal Medicine

## 2021-08-26 ENCOUNTER — Telehealth: Payer: Self-pay | Admitting: Internal Medicine

## 2021-08-26 ENCOUNTER — Other Ambulatory Visit: Payer: Self-pay

## 2021-08-26 VITALS — BP 122/68 | HR 88 | Temp 97.7°F | Ht 64.0 in | Wt 116.0 lb

## 2021-08-26 DIAGNOSIS — Z0001 Encounter for general adult medical examination with abnormal findings: Secondary | ICD-10-CM | POA: Diagnosis not present

## 2021-08-26 DIAGNOSIS — E559 Vitamin D deficiency, unspecified: Secondary | ICD-10-CM | POA: Diagnosis not present

## 2021-08-26 DIAGNOSIS — E039 Hypothyroidism, unspecified: Secondary | ICD-10-CM | POA: Diagnosis not present

## 2021-08-26 DIAGNOSIS — Z23 Encounter for immunization: Secondary | ICD-10-CM | POA: Diagnosis not present

## 2021-08-26 DIAGNOSIS — F515 Nightmare disorder: Secondary | ICD-10-CM

## 2021-08-26 DIAGNOSIS — E538 Deficiency of other specified B group vitamins: Secondary | ICD-10-CM

## 2021-08-26 DIAGNOSIS — E7800 Pure hypercholesterolemia, unspecified: Secondary | ICD-10-CM | POA: Diagnosis not present

## 2021-08-26 DIAGNOSIS — R7302 Impaired glucose tolerance (oral): Secondary | ICD-10-CM

## 2021-08-26 DIAGNOSIS — R269 Unspecified abnormalities of gait and mobility: Secondary | ICD-10-CM | POA: Diagnosis not present

## 2021-08-26 DIAGNOSIS — I10 Essential (primary) hypertension: Secondary | ICD-10-CM

## 2021-08-26 MED ORDER — QUETIAPINE FUMARATE 25 MG PO TABS: 25.0000 mg | ORAL_TABLET | Freq: Every day | ORAL | 3 refills | Status: DC

## 2021-08-26 NOTE — Telephone Encounter (Signed)
Luellen Pucker, pt's daughter, called to express some concerns regarding her mother. Stated her mother has been having significant short term memory loss with paranoia. She is wanting Dr. Jenny Reichmann to do an assessment for Dementia while she is here for her appointment on 08/26/2021. I did relay to her that I could put the message in but the decision would be ultimately up to Dr. Jenny Reichmann.

## 2021-08-26 NOTE — Progress Notes (Signed)
Patient ID: Danielle [name], female   DOB: 1935-12-09, 85 y.o.   MRN: [id number]         Chief Complaint:: wellness exam with daughter from [place], and Leg Pain  With left low back pain, giat d/o, nightmares       HPI:  Danielle [name] is a 85 y.o. female here for wellness exam; decliens covid booster, shingrix, dxa.                          Also c/o persistent but symptomaticlaly much improved left lower back pain and sciatica on gabapentin; stil has gait difficulty and asks for handicapped parking application, as well as outpatient PT eval.  Also has some fairly frequent nightmares that really bother her the next day worsening in the past 2 mo.  Pt remains living independently at homes, has an older mare she cares for, though admits the neighbors starting to complain the place is going downhill.  Denies hyper or hypo thyroid symptoms such as voice, skin or hair change.  Pt denies chest pain, increased sob or doe, wheezing, orthopnea, PND, increased LE swelling, palpitations, dizziness or syncope.   Pt denies polydipsia, polyuria, or new focal neuro s/s.  Trying to follow low chol diet.  Wt overall stable Wt Readings from Last 3 Encounters:  08/26/21 116 lb (52.6 kg)  04/21/21 114 lb 3.2 oz (51.8 kg)  02/10/21 116 lb 6.4 oz (52.8 kg)   BP Readings from Last 3 Encounters:  08/26/21 122/68  04/21/21 (!) 160/78  02/10/21 128/68   Immunization History  Administered Date(s) Administered   Fluad Quad(high Dose 65+) 09/11/2019, 08/26/2021   Influenza Split 10/31/2011, 08/02/2012   Influenza Whole 09/02/2008   Influenza, High Dose Seasonal PF 12/15/2016, 12/17/2018   PFIZER(Purple Top)SARS-COV-2 Vaccination 03/02/2020, 03/23/2020, 09/04/2020   Pneumococcal Conjugate-13 12/18/2014   Pneumococcal Polysaccharide-23 01/19/2004, 09/11/2019   Td 09/02/2008   Tdap 10/23/2019   Zoster, Live 12/21/2006   There are no preventive care reminders to display for this patient.     Past Medical  History:  Diagnosis Date   ALLERGIC RHINITIS 01/09/2008   ANEMIA-NOS 01/09/2008   pt doesn't remember   ANXIETY 01/09/2008   ASTHMATIC BRONCHITIS, ACUTE 10/20/2008   AV BLOCK, COMPLETE 12/16/2009   Breast cancer, right ([facility])    DEPRESSION 01/09/2008   Dizziness and giddiness 02/25/2010   DVT, HX OF 01/09/2008   after delivery of 4 th child   Genetic testing 06/04/2017   Ms. Sparkman underwent genetic counseling and testing for hereditary cancer syndromes on 05/24/2017. Her results were negative for mutations in all 46 genes analyzed by [facility]'s 46-gene Common Hereditary Cancers Panel. Genes analyzed include: APC, ATM, AXIN2, BARD1, BMPR1A, BRCA1, BRCA2, BRIP1, CDH1, CDKN2A, CHEK2, CTNNA1, DICER1, EPCAM, GREM1, HOXB13, KIT, MEN1, MLH1, MSH2, MSH3, MSH6, MUTYH, NBN   GERD 01/09/2008   GLUCOSE INTOLERANCE 01/09/2008   History of kidney stones    HYPERLIPIDEMIA 01/09/2008   HYPERTENSION 01/09/2008   HYPOTHYROIDISM 01/09/2008   Impaired glucose tolerance 07/28/2011   INSOMNIA-SLEEP DISORDER-UNSPEC 06/15/2009   NEPHROLITHIASIS, HX OF 01/09/2008   OSTEOARTHRITIS, CERVICAL SPINE 02/25/2010   Presence of permanent cardiac pacemaker    medtronic, pacemaker dependent   Urine incontinence    Past Surgical History:  Procedure Laterality Date   BREAST BIOPSY Right 09/20/2016   malignant   ERCP W/ SPHICTEROTOMY     pt denies   HEMORRHOID SURGERY     pt denies  IR URETERAL STENT RIGHT NEW ACCESS W/O SEP NEPHROSTOMY CATH  02/09/2020   MASTECTOMY Right 01/11/2017   MASTECTOMY W/ SENTINEL NODE BIOPSY Right 01/11/2017   Procedure: RIGHT MASTECTOMY WITH SENTINEL LYMPH NODE BIOPSY;  Surgeon: [name] Klein, MD;  Location: [place];  Service: General;  Laterality: Right;   NEPHROLITHOTOMY Right 02/09/2020   Procedure: NEPHROLITHOTOMY PERCUTANEOUS;  Surgeon: Kathie Rhodes, MD;  Location: [facility];  Service: Urology;  Laterality: Right;   PACEMAKER PLACEMENT Left    MedTronic EnPulse E2DR01--06 no heartbeat without pacemaker    PERMANENT PACEMAKER GENERATOR CHANGE N/A 07/16/2012   Procedure: PERMANENT PACEMAKER GENERATOR CHANGE;  Surgeon: Deboraha Sprang, MD;  Location: [facility] CATH LAB;  Service: Cardiovascular;  Laterality: N/A;   POLYPECTOMY     Uterine   TOTAL HIP ARTHROPLASTY Right 04/28/2019   Procedure: RIGHT TOTAL HIP ARTHROPLASTY ANTERIOR APPROACH;  Surgeon: Leandrew Koyanagi, MD;  Location: [place];  Service: Orthopedics;  Laterality: Right;    reports that she quit smoking about 49 years ago. Her smoking use included cigarettes. She has a 5.00 pack-year smoking history. She has never used smokeless tobacco. She reports current alcohol use. She reports that she does not use drugs. family history includes Breast cancer in her maternal aunt; Breast cancer (age of onset: 10) in her mother; Lung cancer in her paternal grandfather; Pulmonary embolism in her father; Sudden death in her mother. Allergies  Allergen Reactions   Codeine Nausea And Vomiting   Oxycodone Nausea And Vomiting   Current Outpatient Medications on File Prior to Visit  Medication Sig Dispense Refill   atenolol (TENORMIN) 50 MG tablet Take 1 tablet (50 mg total) by mouth daily. 90 tablet 3   atorvastatin (LIPITOR) 10 MG tablet TAKE 1 TABLET(10 MG) BY MOUTH DAILY Annual appt due in Oct must see provider for future refills 90 tablet 3   azelastine (ASTELIN) 0.1 % nasal spray Place 1 spray into both nostrils as needed (Summer Allergies).      calcium-vitamin D (OSCAL WITH D) 500-200 MG-UNIT tablet Take 1 tablet by mouth daily with breakfast.     enalapril (VASOTEC) 10 MG tablet Take 1 tablet (10 mg total) by mouth daily. Annual appt due in Oct must see provider for future refills 90 tablet 3   gabapentin (NEURONTIN) 100 MG capsule Take 1 capsule (100 mg total) by mouth 3 (three) times daily. 90 capsule 3   HYDROcodone-acetaminophen (NORCO/VICODIN) 5-325 MG tablet Take 1 tablet by mouth every 6 (six) hours as needed. 30 tablet 0   levothyroxine (SYNTHROID)  125 MCG tablet Take 1 tablet (125 mcg total) by mouth daily. 90 tablet 3   predniSONE (STERAPRED UNI-PAK 21 TAB) 10 MG (21) TBPK tablet Take as directed 21 tablet 0   traMADol (ULTRAM) 50 MG tablet Take 1 tablet (50 mg total) by mouth every 6 (six) hours as needed. 10 tablet 0   No current facility-administered medications on file prior to visit.        ROS:  All others reviewed and negative.  Objective        PE:  BP 122/68   Pulse 88   Temp 97.7 F (36.5 C) (Oral)   Ht $R'5\' 4"'hm$  (1.626 m)   Wt 116 lb (52.6 kg)   SpO2 96%   BMI 19.91 kg/m                 Constitutional: Pt appears in NAD  HENT: Head: NCAT.                Right Ear: External ear normal.                 Left Ear: External ear normal.                Eyes: . Pupils are equal, round, and reactive to light. Conjunctivae and EOM are normal               Nose: without d/c or deformity               Neck: Neck supple. Gross normal ROM               Cardiovascular: Normal rate and regular rhythm.                 Pulmonary/Chest: Effort normal and breath sounds without rales or wheezing.                Abd:  Soft, NT, ND, + BS, no organomegaly               Neurological: Pt is alert. At baseline orientation, motor grossly intact               Skin: Skin is warm. No rashes, no other new lesions, LE edema - none               Psychiatric: Pt behavior is normal without agitation   Micro: none  Cardiac tracings I have personally interpreted today:  none  Pertinent Radiological findings (summarize): none   Lab Results  Component Value Date   WBC 5.1 09/10/2020   HGB 12.1 09/10/2020   HCT 37.4 09/10/2020   PLT 150 09/10/2020   GLUCOSE 69 (L) 09/10/2020   CHOL 194 09/11/2019   TRIG 95.0 09/11/2019   HDL 75.30 09/11/2019   LDLDIRECT 128.0 05/11/2016   LDLCALC 100 (H) 09/11/2019   ALT 9 09/10/2020   AST 20 09/10/2020   NA 141 09/10/2020   K 4.6 09/10/2020   CL 105 09/10/2020   CREATININE 1.11 (H)  09/10/2020   BUN 19 09/10/2020   CO2 30 09/10/2020   TSH 13.41 (H) 09/11/2019   INR 1.0 02/09/2020   HGBA1C 6.1 09/11/2019   Assessment/Plan:  Danielle [name] is a 85 y.o. White or Caucasian [1] female with  has a past medical history of ALLERGIC RHINITIS (01/09/2008), ANEMIA-NOS (01/09/2008), ANXIETY (01/09/2008), ASTHMATIC BRONCHITIS, ACUTE (10/20/2008), AV BLOCK, COMPLETE (12/16/2009), Breast cancer, right ([place]), DEPRESSION (01/09/2008), Dizziness and giddiness (02/25/2010), DVT, HX OF (01/09/2008), Genetic testing (06/04/2017), GERD (01/09/2008), GLUCOSE INTOLERANCE (01/09/2008), History of kidney stones, HYPERLIPIDEMIA (01/09/2008), HYPERTENSION (01/09/2008), HYPOTHYROIDISM (01/09/2008), Impaired glucose tolerance (07/28/2011), INSOMNIA-SLEEP DISORDER-UNSPEC (06/15/2009), NEPHROLITHIASIS, HX OF (01/09/2008), OSTEOARTHRITIS, CERVICAL SPINE (02/25/2010), Presence of permanent cardiac pacemaker, and Urine incontinence.  Encounter for well adult exam with abnormal findings Age and sex appropriate education and counseling updated with regular exercise and diet Referrals for preventative services - declines dxa Immunizations addressed - declines covid bosoter, shingrix Smoking counseling  - none needed Evidence for depression or other mood disorder - persistent anxiety, declines change in tx or referral Most recent labs reviewed. I have personally reviewed and have noted: 1) the patient's medical and social history 2) The patient's current medications and supplements 3) The patient's height, weight, and BMI have been recorded in the chart   Hypothyroidism Lab Results  Component Value Date   TSH 13.41 (H) 09/11/2019  Uncontrolled with last lab, pt to continue levothyroxine 125 and f/u TSH with labs today   Hyperlipidemia Lab Results  Component Value Date   LDLCALC 100 (H) 09/11/2019   Mild uncontrolled, goal < 100, pt to continue current statin lipitor 10 - declines inccrease  Essential  hypertension BP Readings from Last 3 Encounters:  08/26/21 122/68  04/21/21 (!) 160/78  02/10/21 128/68   Improved,, pt to continue medical treatment tenormin, vasotec   Impaired glucose tolerance Lab Results  Component Value Date   HGBA1C 6.1 09/11/2019   Stable, pt to continue current medical treatment - diet   Nightmare Mild worsening, for seroquel 25 qhs,  to f/u any worsening symptoms or concerns  Gait disorder Also for referral outpt PT  Followup: No follow-ups on file.  Cathlean Cower, MD 08/27/2021 3:32 PM [facility]

## 2021-08-26 NOTE — Patient Instructions (Addendum)
You had the flu shot today  You are given the handicapped parking application signed today  Please take all new medication as prescribed - the very low dose seroquel 25 mg at bedtime for nightmares  You will be contacted regarding the referral for: Physical Therapy ([facility])  Please continue all other medications as before, and refills have been done if requested.  Please have the pharmacy call with any other refills you may need.  Please continue your efforts at being more active, low cholesterol diet, and weight control.  You are otherwise up to date with prevention measures today.  Please keep your appointments with your specialists as you may have planned  Please go to the LAB at the blood drawing area for the tests to be done  You will be contacted by phone if any changes need to be made immediately.  Otherwise, you will receive a letter about your results with an explanation, but please check with MyChart first.  Please remember to sign up for MyChart if you have not done so, as this will be important to you in the future with finding out test results, communicating by private email, and scheduling acute appointments online when needed.  Please make an Appointment to return in 6 months, or sooner if needed

## 2021-08-27 ENCOUNTER — Encounter: Payer: Self-pay | Admitting: Internal Medicine

## 2021-08-27 DIAGNOSIS — R269 Unspecified abnormalities of gait and mobility: Secondary | ICD-10-CM

## 2021-08-27 HISTORY — DX: Unspecified abnormalities of gait and mobility: R26.9

## 2021-08-27 NOTE — Assessment & Plan Note (Signed)
Also for referral outpt PT

## 2021-08-27 NOTE — Assessment & Plan Note (Signed)
Lab Results  Component Value Date   LDLCALC 100 (H) 09/11/2019   Mild uncontrolled, goal < 100, pt to continue current statin lipitor 10 - declines inccrease

## 2021-08-27 NOTE — Assessment & Plan Note (Signed)
Lab Results  Component Value Date   TSH 13.41 (H) 09/11/2019   Uncontrolled with last lab, pt to continue levothyroxine 125 and f/u TSH with labs today

## 2021-08-27 NOTE — Assessment & Plan Note (Signed)
BP Readings from Last 3 Encounters:  08/26/21 122/68  04/21/21 (!) 160/78  02/10/21 128/68   Improved,, pt to continue medical treatment tenormin, vasotec

## 2021-08-27 NOTE — Assessment & Plan Note (Signed)
Mild worsening, for seroquel 25 qhs,  to f/u any worsening symptoms or concerns

## 2021-08-27 NOTE — Assessment & Plan Note (Signed)
Age and sex appropriate education and counseling updated with regular exercise and diet Referrals for preventative services - declines dxa Immunizations addressed - declines covid bosoter, shingrix Smoking counseling  - none needed Evidence for depression or other mood disorder - persistent anxiety, declines change in tx or referral Most recent labs reviewed. I have personally reviewed and have noted: 1) the patient's medical and social history 2) The patient's current medications and supplements 3) The patient's height, weight, and BMI have been recorded in the chart

## 2021-08-27 NOTE — Assessment & Plan Note (Signed)
Lab Results  Component Value Date   HGBA1C 6.1 09/11/2019   Stable, pt to continue current medical treatment - diet

## 2021-08-30 ENCOUNTER — Other Ambulatory Visit: Payer: Self-pay | Admitting: Internal Medicine

## 2021-08-30 ENCOUNTER — Telehealth: Payer: Self-pay | Admitting: Internal Medicine

## 2021-08-30 ENCOUNTER — Telehealth: Payer: Self-pay | Admitting: Oncology

## 2021-08-30 MED ORDER — ATENOLOL 50 MG PO TABS: 50.0000 mg | ORAL_TABLET | Freq: Every day | ORAL | 3 refills | Status: DC

## 2021-08-30 MED ORDER — ATORVASTATIN CALCIUM 10 MG PO TABS: ORAL_TABLET | ORAL | 3 refills | Status: DC

## 2021-08-30 MED ORDER — ENALAPRIL MALEATE 10 MG PO TABS: 10.0000 mg | ORAL_TABLET | Freq: Every day | ORAL | 3 refills | Status: DC

## 2021-08-30 MED ORDER — LEVOTHYROXINE SODIUM 125 MCG PO TABS: 125.0000 ug | ORAL_TABLET | Freq: Every day | ORAL | 3 refills | Status: DC

## 2021-08-30 MED ORDER — GABAPENTIN 100 MG PO CAPS: 100.0000 mg | ORAL_CAPSULE | Freq: Three times a day (TID) | ORAL | 3 refills | Status: DC

## 2021-08-30 MED ORDER — CALCIUM CARBONATE-VITAMIN D 500-200 MG-UNIT PO TABS: 1.0000 | ORAL_TABLET | Freq: Every day | ORAL | 3 refills | Status: DC

## 2021-08-30 MED ORDER — AZELASTINE HCL 0.1 % NA SOLN: 1.0000 | NASAL | 11 refills | Status: DC | PRN

## 2021-08-30 NOTE — Telephone Encounter (Signed)
Scheduled per sch msg. Called and left msg  

## 2021-08-30 NOTE — Telephone Encounter (Signed)
1.Medication Requested:  atenolol (TENORMIN) 50 MG tablet  atorvastatin (LIPITOR) 10 MG tablet  azelastine (ASTELIN) 0.1 % nasal spray  calcium-vitamin D (OSCAL WITH D) 500-200 MG-UNIT tablet  enalapril (VASOTEC) 10 MG tablet  gabapentin (NEURONTIN) 100 MG capsule  levothyroxine (SYNTHROID) 125 MCG tablet   2. Pharmacy (Name, Street, [place]): [facility] - [address], [address]  Phone:  [phone] Fax:  [phone]   3. On Med List: yes  4. Last Visit with PCP: 10.07.22  5. Next visit date with PCP: 04.07.23   Agent: Please be advised that RX refills may take up to 3 business days. We ask that you follow-up with your pharmacy.

## 2021-09-08 ENCOUNTER — Ambulatory Visit: Payer: Medicare HMO | Admitting: Oncology

## 2021-09-08 ENCOUNTER — Other Ambulatory Visit: Payer: Medicare HMO

## 2021-10-19 ENCOUNTER — Other Ambulatory Visit: Payer: Self-pay

## 2021-10-19 DIAGNOSIS — C50811 Malignant neoplasm of overlapping sites of right female breast: Secondary | ICD-10-CM

## 2021-10-19 DIAGNOSIS — Z171 Estrogen receptor negative status [ER-]: Secondary | ICD-10-CM

## 2021-10-20 ENCOUNTER — Ambulatory Visit: Payer: Self-pay | Admitting: Oncology

## 2021-10-20 ENCOUNTER — Other Ambulatory Visit: Payer: Medicare HMO

## 2021-10-20 NOTE — Progress Notes (Incomplete)
[place]  [phone] [phone]     ID: Danielle [name] DOB: 02-18-1936  MR#: [id number]  VOH#:[id number]  Patient Care Team: Danielle Borg, MD as PCP - Danielle [name], Revonda Standard, MD as PCP - Cardiology (Cardiology) Danielle Kussmaul, MD as Consulting Physician (General Surgery) Danielle [name], Danielle Dad, MD as Consulting Physician (Oncology) Danielle Artist, MD as Consulting Physician (Gastroenterology) Deboraha Sprang, MD as Consulting Physician (Cardiology) Kennon Holter, NP as Nurse Practitioner (Nurse Practitioner) Danielle Morgan, MD as Referring Physician (Orthopedic Surgery) Danielle Koyanagi, MD as Attending Physician (Orthopedic Surgery) OTHER MD:   CHIEF COMPLAINT: Triple negative breast cancer (s/p right mastectomy)  CURRENT TREATMENT: Observation   INTERVAL HISTORY: Danielle [name] returns today for follow-up of her triple negative breast cancer. She was last seen by me on 07/23/2019. She continues under observation.    She presented to Dr. Barry [name] with a palpable nodule on the right chest wall. Dr. Barry [name] referred her to PA Danielle [name] on 09/10/2020, who noted a nodular density located above her mastectomy scar on physical exam.  She proceeded to soft tissue biopsy on 12/13/2020 under Dr. Barry [name]. Pathology from the procedure ([id number]) revealed findings consistent with recurrent high grade invasive ductal carcinoma, with perineural invasion and focal extension to resection margin.  We have tried to reach her regarding the results and the need for re-evaluation, but she did not answer our calls or show to the appointments we scheduled.  Her most recent left screening mammogram was done on 09/26/2019 and was negative.   REVIEW OF SYSTEMS: Danielle [name]    COVID 19 VACCINATION STATUS: [place] x3, last 08/2020   BREAST CANCER HISTORY: From the original intake note:  Danielle [name] herself noted a change in her right breast sometime  in September 2017. She brought this to medical attention and on 09/19/2016 had diagnostic bilateral mammography with tomography and [facility]. The breast density was category D. There was an irregular mass in the upper central breast. This was spiculated and measured at least 2 cm. There were also numerous linear calcifications in the upper outer quadrant of the right breast spanning a minimum of 2.0 cm. On physical exam there was a rounded mobile mass in the upper inner quadrant of the right breast. Ultrasound of the right breast confirmed an irregular hypoechoic mass at the 12:30 o'clock position 5 cm from the nipple measuring 1.5 cm, with a 0.7 cm satellite nodule.  On 09/20/2016 the patient underwent biopsy of the right breast 2. Biopsy #1 described as upper outer quadrant showed high-grade ductal carcinoma in situ estrogen and progesterone receptor negative. Right breast biopsy #2 described as 12:30 o'clock showed invasive ductal carcinoma, estrogen and progesterone receptor negative, HER-2 not amplified with an MIB-1 of 1.24 and the number per cell 1.80 , and the MIB-1  was 80%.  The patient met with my partner Dr. Sonny [name] January 2018. He recommended Taxotere and Cytoxan chemotherapy for 4 cycles after her surgery.  On 01/11/2017 the patient underwent right mastectomy with sentinel lymph node biopsy. The final pathology here ([id number]) showed in the right breast, invasive ductal carcinoma, grade 3, measuring 2.3 cm with negative margins. All 8 lymph nodes examined, including 3 sentinel lymph nodes, were clear.  The patient declined a follow-up appointment with Dr. Sonny [name].She was scheduled to see me 03/12/2017 but did not show.  Her subsequent history is as detailed below   PAST MEDICAL HISTORY:  Past Medical History:  Diagnosis Date   ALLERGIC RHINITIS 01/09/2008   ANEMIA-NOS 01/09/2008   pt doesn't remember   ANXIETY 01/09/2008   ASTHMATIC BRONCHITIS,  ACUTE 10/20/2008   AV BLOCK, COMPLETE 12/16/2009   Breast cancer, right ([place])    DEPRESSION 01/09/2008   Dizziness and giddiness 02/25/2010   DVT, HX OF 01/09/2008   after delivery of 4 th child   Genetic testing 06/04/2017   Ms. Everding underwent genetic counseling and testing for hereditary cancer syndromes on 05/24/2017. Her results were negative for mutations in all 46 genes analyzed by [facility]'s 46-gene Common Hereditary Cancers Panel. Genes analyzed include: APC, ATM, AXIN2, BARD1, BMPR1A, BRCA1, BRCA2, BRIP1, CDH1, CDKN2A, CHEK2, CTNNA1, DICER1, EPCAM, GREM1, HOXB13, KIT, MEN1, MLH1, MSH2, MSH3, MSH6, MUTYH, NBN   GERD 01/09/2008   GLUCOSE INTOLERANCE 01/09/2008   History of kidney stones    HYPERLIPIDEMIA 01/09/2008   HYPERTENSION 01/09/2008   HYPOTHYROIDISM 01/09/2008   Impaired glucose tolerance 07/28/2011   INSOMNIA-SLEEP DISORDER-UNSPEC 06/15/2009   NEPHROLITHIASIS, HX OF 01/09/2008   OSTEOARTHRITIS, CERVICAL SPINE 02/25/2010   Presence of permanent cardiac pacemaker    medtronic, pacemaker dependent   Urine incontinence     PAST SURGICAL HISTORY: Past Surgical History:  Procedure Laterality Date   BREAST BIOPSY Right 09/20/2016   malignant   ERCP W/ SPHICTEROTOMY     pt denies   HEMORRHOID SURGERY     pt denies   IR URETERAL STENT RIGHT NEW ACCESS W/O SEP NEPHROSTOMY CATH  02/09/2020   MASTECTOMY Right 01/11/2017   MASTECTOMY W/ SENTINEL NODE BIOPSY Right 01/11/2017   Procedure: RIGHT MASTECTOMY WITH SENTINEL LYMPH NODE BIOPSY;  Surgeon: Danielle Klein, MD;  Location: [facility];  Service: General;  Laterality: Right;   NEPHROLITHOTOMY Right 02/09/2020   Procedure: NEPHROLITHOTOMY PERCUTANEOUS;  Surgeon: Kathie Rhodes, MD;  Location: [facility];  Service: Urology;  Laterality: Right;   PACEMAKER PLACEMENT Left    MedTronic EnPulse [id number] no heartbeat without pacemaker   PERMANENT PACEMAKER GENERATOR CHANGE N/A 07/16/2012   Procedure: PERMANENT PACEMAKER GENERATOR CHANGE;  Surgeon: Deboraha Sprang, MD;  Location: [facility] C[facility];  Service: Cardiovascular;  Laterality: N/A;   POLYPECTOMY     Uterine   TOTAL HIP ARTHROPLASTY Right 04/28/2019   Procedure: RIGHT TOTAL HIP ARTHROPLASTY ANTERIOR APPROACH;  Surgeon: Danielle Koyanagi, MD;  Location: [facility];  Service: Orthopedics;  Laterality: Right;    FAMILY HISTORY Family History  Problem Relation Age of Onset   Sudden death Mother        d.32s   Breast cancer Mother 13   Pulmonary embolism Father        Possible   Lung cancer Paternal Grandfather        history of smoking   Breast cancer Maternal Aunt        d.78s  The patient's father died in his 35s from a "clot to the heart". The patient's mother was diagnosed with breast cancer in her early 65s and died from metastatic breast cancer approximately 30 years later. A maternal aunt (her mother's only sister) was also diagnosed with breast cancer in her early 75s according to the patient. The patient had 2 brothers, 3 sisters. There is no other history of breast or ovarian cancer in the family to her knowledge   GYNECOLOGIC HISTORY:  No LMP recorded. Patient is postmenopausal. She can't remember age at menarche or time of menopause. She is GX P5, including one premature birth. 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 Her husband Marry Guan") was a Visual merchandiser. 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 He passed away in early Apr 08, 2021. The patient's son Nada Libman lives in [place] where he works as an Technical sales engineer. Daughter Lauris Poag lives in [place] and is a Multimedia programmer. Daughter Isabelle Course lives in [place] and is an Tree surgeon as well as a third Merchant navy officer. Son Sheryle Spray was born premature and did not survive. Daughter Magda Paganini lives in [place] where she is a Marine scientist with a fine arts degree. The patient has 4 grandchildren. She is a Air traffic controller.    ADVANCED DIRECTIVES:  In place   HEALTH MAINTENANCE: Social History   Tobacco Use   Smoking status: Former    Packs/day: 0.20    Years: 25.00    Pack years: 5.00    Types: Cigarettes    Quit date: 11/21/1971    Years since quitting: 49.9   Smokeless tobacco: Never  Vaping Use   Vaping Use: Never used  Substance Use Topics   Alcohol use: Yes    Comment: wine   Drug use: No     Colonoscopy: Due  PAP:  Bone density:   Allergies  Allergen Reactions   Codeine Nausea And Vomiting   Oxycodone Nausea And Vomiting    Current Outpatient Medications  Medication Sig Dispense Refill   atenolol (TENORMIN) 50 MG tablet Take 1 tablet (50 mg total) by mouth daily. 90 tablet 3   atorvastatin (LIPITOR) 10 MG tablet TAKE 1 TABLET(10 MG) BY MOUTH DAILY 90 tablet 3   azelastine (ASTELIN) 0.1 % nasal spray Place 1 spray into both nostrils as needed (Summer Allergies). 30 mL 11   calcium-vitamin D (OSCAL WITH D) 500-200 MG-UNIT tablet Take 1 tablet by mouth daily with breakfast. 90 tablet 3   enalapril (VASOTEC) 10 MG tablet Take 1 tablet (10 mg total) by mouth daily. 90 tablet 3   gabapentin (NEURONTIN) 100 MG capsule Take 1 capsule (100 mg total) by mouth 3 (three) times daily. 90 capsule 3   levothyroxine (SYNTHROID) 125 MCG tablet Take 1 tablet (125 mcg total) by mouth daily. 90 tablet 3   QUEtiapine (SEROQUEL) 25 MG tablet Take 1 tablet (25 mg total) by mouth at bedtime. 90 tablet 3   No current facility-administered medications for this visit.    OBJECTIVE: Older white woman in no acute distress  There were no vitals filed for this visit. Wt Readings from Last 3 Encounters:  08/26/21 116 lb (52.6 kg)  04/21/21 114 lb 3.2 oz (51.8 kg)  02/10/21 116 lb 6.4 oz (52.8 kg)   There is no height or weight on file to calculate BMI.    ECOG FS:1 - Symptomatic but completely ambulatory  Sclerae unicteric, EOMs intact Wearing a mask No cervical or supraclavicular adenopathy Lungs no rales or rhonchi Heart  regular rate and rhythm Abd soft, nontender, positive bowel sounds MSK no focal spinal tenderness, no upper extremity lymphedema Neuro: nonfocal, well oriented, appropriate affect Breasts:    {Telehealth Visit}   LAB RESULTS:  CMP     Component Value Date/Time   NA 141 09/10/2020 1337   NA 140 07/25/2017 1347   K 4.6 09/10/2020 1337   K 4.3 07/25/2017 1347   CL 105 09/10/2020 1337   CO2 30 09/10/2020 1337   CO2 26 07/25/2017 1347  GLUCOSE 69 (L) 09/10/2020 1337   GLUCOSE 77 07/25/2017 1347   BUN 19 09/10/2020 1337   BUN 27.4 (H) 07/25/2017 1347   CREATININE 1.11 (H) 09/10/2020 1337   CREATININE 1.1 07/25/2017 1347   CALCIUM 10.1 09/10/2020 1337   CALCIUM 9.8 07/25/2017 1347   PROT 6.9 09/10/2020 1337   PROT 6.6 07/25/2017 1347   ALBUMIN 4.3 09/10/2020 1337   ALBUMIN 4.0 07/25/2017 1347   AST 20 09/10/2020 1337   AST 29 07/25/2017 1347   ALT 9 09/10/2020 1337   ALT 28 07/25/2017 1347   ALKPHOS 83 09/10/2020 1337   ALKPHOS 75 07/25/2017 1347   BILITOT 1.2 09/10/2020 1337   BILITOT 1.21 (H) 07/25/2017 1347   GFRNONAA 50 (L) 09/10/2020 1337   GFRNONAA 57 (L) 07/15/2012 1714   GFRAA 58 (L) 02/03/2020 1448   GFRAA 66 07/15/2012 1714    No results found for: Ronnald Ramp, A1GS, A2GS, BETS, BETA2SER, GAMS, MSPIKE, SPEI  No results found for: Nils Pyle, [facility]  Lab Results  Component Value Date   WBC 5.1 09/10/2020   NEUTROABS 2.8 09/10/2020   HGB 12.1 09/10/2020   HCT 37.4 09/10/2020   MCV 92.3 09/10/2020   PLT 150 09/10/2020      Chemistry      Component Value Date/Time   NA 141 09/10/2020 1337   NA 140 07/25/2017 1347   K 4.6 09/10/2020 1337   K 4.3 07/25/2017 1347   CL 105 09/10/2020 1337   CO2 30 09/10/2020 1337   CO2 26 07/25/2017 1347   BUN 19 09/10/2020 1337   BUN 27.4 (H) 07/25/2017 1347   CREATININE 1.11 (H) 09/10/2020 1337   CREATININE 1.1 07/25/2017 1347      Component Value Date/Time   CALCIUM 10.1  09/10/2020 1337   CALCIUM 9.8 07/25/2017 1347   ALKPHOS 83 09/10/2020 1337   ALKPHOS 75 07/25/2017 1347   AST 20 09/10/2020 1337   AST 29 07/25/2017 1347   ALT 9 09/10/2020 1337   ALT 28 07/25/2017 1347   BILITOT 1.2 09/10/2020 1337   BILITOT 1.21 (H) 07/25/2017 1347      No results found for: LABCA2  No components found for: [id number]  No results for input(s): INR in the last 168 hours.  Urinalysis    Component Value Date/Time   COLORURINE YELLOW 10/09/2019 1523   APPEARANCEUR Cloudy (A) 10/09/2019 1523   LABSPEC >=1.030 (A) 10/09/2019 1523   PHURINE 6.0 10/09/2019 1523   GLUCOSEU NEGATIVE 10/09/2019 1523   HGBUR LARGE (A) 10/09/2019 1523   BILIRUBINUR NEGATIVE 10/09/2019 1523   [place] 10/09/2019 1523   PROTEINUR NEGATIVE 08/19/2018 1815   UROBILINOGEN 0.2 10/09/2019 1523   NITRITE NEGATIVE 10/09/2019 1523   LEUKOCYTESUR TRACE (A) 10/09/2019 1523    STUDIES: No results found.   ELIGIBLE FOR AVAILABLE RESEARCH PROTOCOL: no  ASSESSMENT: 85 y.o. [place] woman woman status post right breast overlapping site biopsies 09/20/2016 showing invasive ductal carcinoma, estrogen and progesterone receptor negative, with an MIB-1 of 80%, and no HER-2 amplification. ductal carcinoma in situ, high-grade, estrogen and progesterone receptor negative  (1) status post right mastectomy with sentinel lymph node and axillary lymph node sampling 01/11/2017 for a pT2 pN0, stage IIB invasive ductal carcinoma, grade 3, with negative margins  (2) genetics testing 05/24/2017 through [facility]'s Common Hereditary Cancers Panel found no deleterious mutations in APC, ATM, AXIN2, BARD1, BMPR1A, BRCA1, BRCA2, BRIP1, CDH1, CDKN2A, CHEK2, CTNNA1, DICER1, EPCAM, GREM1, HOXB13, KIT, MEN1, MLH1, MSH2, MSH3, MSH6, MUTYH,  NBN, NF1, NTHL1, PALB2, PDGFRA, PMS2, POLD1, POLE, PTEN, RAD50, RAD51C, RAD51D, SDHA, SDHB, SDHC, SDHD, SMAD4, SMARCA4, STK11, TP53, TSC1, TSC2, and VHL.  (3) the patient  opted against adjuvant chemotherapy    PLAN: Danielle [name] is now 2-1/2 years out from definitive surgery for her breast cancer with no evidence of disease recurrence.  This is very favorable.  She has opted for observation alone but she is doing a good diet and exercise program.  She is a bit behind on her mammography and I am writing the order for her to have a mammogram at the breast center sometime within the next month or 2.  She did excellently by her account on her right hip arthroplasty and is delighted with the care she is receiving from Dr. Erlinda Hong  She knows to call for any other issue that may develop before her next visit here which will be in 1 year   Danielle [name], Danielle Dad, MD  10/20/21 12:43 PM Medical Oncology and Hematology [facility] [place], [place] [address] Tel. [phone]    Fax. [phone]   I, Wilburn Mylar, am acting as scribe for Dr. Virgie [name]. Danielle [name].  {Add scribe attestation statement}   *Total Encounter Time as defined by the Centers for Medicare and Medicaid Services includes, in addition to the face-to-face time of a patient visit (documented in the note above) non-face-to-face time: obtaining and reviewing outside history, ordering and reviewing medications, tests or procedures, care coordination (communications with other health care professionals or caregivers) and documentation in the medical record.

## 2021-10-21 ENCOUNTER — Telehealth: Payer: Self-pay | Admitting: Internal Medicine

## 2021-10-21 NOTE — Telephone Encounter (Signed)
Danielle [name] from [facility] calling in  Requesting verbal order for a patient evaluation for Hospice care  CB # [phone]

## 2021-10-21 NOTE — Telephone Encounter (Signed)
Notified Joann from hospice care with verbals. Manus Gunning was on another line.

## 2021-10-21 NOTE — Telephone Encounter (Signed)
Ok for verbal 

## 2021-10-22 ENCOUNTER — Other Ambulatory Visit: Payer: Self-pay

## 2021-10-22 ENCOUNTER — Encounter (HOSPITAL_COMMUNITY): Payer: Self-pay

## 2021-10-22 ENCOUNTER — Emergency Department (HOSPITAL_COMMUNITY)
Admission: EM | Admit: 2021-10-22 | Discharge: 2021-10-23 | Disposition: A | Discharge status: Home / Self Care | Payer: Medicare HMO | Attending: Emergency Medicine | Admitting: Emergency Medicine

## 2021-10-22 DIAGNOSIS — Z950 Presence of cardiac pacemaker: Secondary | ICD-10-CM | POA: Insufficient documentation

## 2021-10-22 DIAGNOSIS — R4189 Other symptoms and signs involving cognitive functions and awareness: Secondary | ICD-10-CM | POA: Insufficient documentation

## 2021-10-22 DIAGNOSIS — Z87891 Personal history of nicotine dependence: Secondary | ICD-10-CM | POA: Diagnosis not present

## 2021-10-22 DIAGNOSIS — Z853 Personal history of malignant neoplasm of breast: Secondary | ICD-10-CM | POA: Insufficient documentation

## 2021-10-22 DIAGNOSIS — J45909 Unspecified asthma, uncomplicated: Secondary | ICD-10-CM | POA: Insufficient documentation

## 2021-10-22 DIAGNOSIS — R410 Disorientation, unspecified: Secondary | ICD-10-CM | POA: Insufficient documentation

## 2021-10-22 DIAGNOSIS — R4182 Altered mental status, unspecified: Secondary | ICD-10-CM | POA: Insufficient documentation

## 2021-10-22 DIAGNOSIS — E039 Hypothyroidism, unspecified: Secondary | ICD-10-CM | POA: Diagnosis not present

## 2021-10-22 DIAGNOSIS — I10 Essential (primary) hypertension: Secondary | ICD-10-CM | POA: Diagnosis not present

## 2021-10-22 DIAGNOSIS — Z96641 Presence of right artificial hip joint: Secondary | ICD-10-CM | POA: Diagnosis not present

## 2021-10-22 DIAGNOSIS — Z9011 Acquired absence of right breast and nipple: Secondary | ICD-10-CM | POA: Diagnosis not present

## 2021-10-22 DIAGNOSIS — Z79899 Other long term (current) drug therapy: Secondary | ICD-10-CM | POA: Diagnosis not present

## 2021-10-22 DIAGNOSIS — R4689 Other symptoms and signs involving appearance and behavior: Secondary | ICD-10-CM

## 2021-10-22 DIAGNOSIS — Z20822 Contact with and (suspected) exposure to covid-19: Secondary | ICD-10-CM | POA: Insufficient documentation

## 2021-10-22 DIAGNOSIS — R5383 Other fatigue: Secondary | ICD-10-CM | POA: Diagnosis not present

## 2021-10-22 LAB — I-STAT VENOUS BLOOD GAS, ED
Acid-Base Excess: 4 mmol/L — ABNORMAL HIGH (ref 0.0–2.0)
Bicarbonate: 29.1 mmol/L — ABNORMAL HIGH (ref 20.0–28.0)
Calcium, Ion: 1.31 mmol/L (ref 1.15–1.40)
HCT: 30 % — ABNORMAL LOW (ref 36.0–46.0)
Hemoglobin: 10.2 g/dL — ABNORMAL LOW (ref 12.0–15.0)
O2 Saturation: 53 %
Potassium: 4.8 mmol/L (ref 3.5–5.1)
Sodium: 139 mmol/L (ref 135–145)
TCO2: 31 mmol/L (ref 22–32)
pCO2, Ven: 46.4 mmHg (ref 44.0–60.0)
pH, Ven: 7.406 (ref 7.250–7.430)
pO2, Ven: 28 mmHg — CL (ref 32.0–45.0)

## 2021-10-22 NOTE — ED Provider Notes (Signed)
[facility] EMERGENCY DEPARTMENT Provider Note  CSN: [id number] Arrival date & time: 10/22/21 2328  Chief Complaint(s) Altered Mental Status  HPI Danielle [name] is a 85 y.o. female    Altered Mental Status Presenting symptoms: confusion   Severity:  Moderate Episode history:  Continuous Duration:  6 months Timing:  Constant Progression:  Waxing and waning (Gradually worsening throughout the timeframe, more noticeable over the past month, even more so in the past week.) Chronicity:  New Context comment:  Patient has a history of depression and anxiety.  Lost her husband 1 year ago. h/o insomnia recently placed on Seroquel.  Patient also has a h/o right hip replacement.  Daughter reports that 6 months ago the patient called her saying that she believed there was somebody in the closet.  Family has noted several instances where the patient has been forgetful or little bit paranoid but have chalked it up to old age.  They have noted this been gradually progressing since then.  She reports that recently patient has been increasingly paranoid, thinking that the daughter was trying to kidnap her and tried to escape from the home.   Past Medical History Past Medical History:  Diagnosis Date   ALLERGIC RHINITIS 01/09/2008   ANEMIA-NOS 01/09/2008   pt doesn't remember   ANXIETY 01/09/2008   ASTHMATIC BRONCHITIS, ACUTE 10/20/2008   AV BLOCK, COMPLETE 12/16/2009   Breast cancer, right ([place])    DEPRESSION 01/09/2008   Dizziness and giddiness 02/25/2010   DVT, HX OF 01/09/2008   after delivery of 4 th child   Genetic testing 06/04/2017   Ms. Rhines underwent genetic counseling and testing for hereditary cancer syndromes on 05/24/2017. Her results were negative for mutations in all 46 genes analyzed by [facility]'s 46-gene Common Hereditary Cancers Panel. Genes analyzed include: APC, ATM, AXIN2, BARD1, BMPR1A, BRCA1, BRCA2, BRIP1, CDH1, CDKN2A, CHEK2, CTNNA1, DICER1, EPCAM, GREM1,  HOXB13, KIT, MEN1, MLH1, MSH2, MSH3, MSH6, MUTYH, NBN   GERD 01/09/2008   GLUCOSE INTOLERANCE 01/09/2008   History of kidney stones    HYPERLIPIDEMIA 01/09/2008   HYPERTENSION 01/09/2008   HYPOTHYROIDISM 01/09/2008   Impaired glucose tolerance 07/28/2011   INSOMNIA-SLEEP DISORDER-UNSPEC 06/15/2009   NEPHROLITHIASIS, HX OF 01/09/2008   OSTEOARTHRITIS, CERVICAL SPINE 02/25/2010   Presence of permanent cardiac pacemaker    medtronic, pacemaker dependent   Urine incontinence    Patient Active Problem List   Diagnosis Date Noted   Gait disorder 08/27/2021   Nightmare 08/26/2021   Low back pain 04/21/2021   Trigger finger of left hand 07/14/2020   Trigger finger of right hand 07/14/2020   Arthritis 06/11/2020   Trigger middle finger 06/11/2020   Renal calculus, right 02/09/2020   Kidney stone 02/09/2020   Neck pain 11/04/2019   Status post total hip replacement, right 11/04/2019   Urinary incontinence 10/09/2019   History of total hip arthroplasty 04/28/2019   Degenerative joint disease of right hip 12/17/2018   Genetic testing 06/04/2017   Overlapping malignant neoplasm of female breast ([place]) 04/24/2017   Peripheral venous insufficiency 03/15/2017   Subungual hematoma of foot 03/15/2017   Indigestion 03/07/2017   Nausea & vomiting 02/22/2017   Cough 02/22/2017   Blood in urine 02/22/2017   Nausea and vomiting 02/22/2017   S/P right mastectomy 01/11/2017   History of right mastectomy 01/11/2017   Malignant neoplasm of upper-inner quadrant of female breast ([place]) 10/06/2016   Right hip pain 07/18/2016   Olecranon bursitis of left elbow 05/05/2015   Alopecia  01/21/2015   Left leg swelling 01/21/2015   Cervical radiculopathy 07/11/2013   Encounter for well adult exam with abnormal findings 08/03/2012   Dizziness 08/03/2012   Rash 03/26/2012   Pacemaker-medtronic 01/23/2012   Cardiac pacemaker in situ 01/23/2012   Impaired glucose tolerance 07/28/2011   OSTEOARTHRITIS, CERVICAL SPINE  02/25/2010   Dizziness and giddiness 02/25/2010   Spondylosis 02/25/2010   Complete atrioventricular block ([place]) 12/16/2009   Insomnia 06/15/2009   Hypothyroidism 01/09/2008   Hyperlipidemia 01/09/2008   Anemia 01/09/2008   Anxiety state 01/09/2008   Depressive disorder 01/09/2008   Essential hypertension 01/09/2008   Allergic rhinitis 01/09/2008   Gastroesophageal reflux disease 01/09/2008   DVT, HX OF 01/09/2008   NEPHROLITHIASIS, HX OF 01/09/2008   History of urinary stone 01/09/2008   History of thromboembolism of vein 01/09/2008   Home Medication(s) Prior to Admission medications   Medication Sig Start Date End Date Taking? Authorizing Provider  atenolol (TENORMIN) 50 MG tablet Take 1 tablet (50 mg total) by mouth daily. 08/30/21   Biagio Borg, MD  atorvastatin (LIPITOR) 10 MG tablet TAKE 1 TABLET(10 MG) BY MOUTH DAILY 08/30/21   Biagio Borg, MD  azelastine (ASTELIN) 0.1 % nasal spray Place 1 spray into both nostrils as needed (Summer Allergies). 08/30/21   Biagio Borg, MD  calcium-vitamin D (OSCAL WITH D) 500-200 MG-UNIT tablet Take 1 tablet by mouth daily with breakfast. 08/30/21   Biagio Borg, MD  enalapril (VASOTEC) 10 MG tablet Take 1 tablet (10 mg total) by mouth daily. 08/30/21   Biagio Borg, MD  gabapentin (NEURONTIN) 100 MG capsule Take 1 capsule (100 mg total) by mouth 3 (three) times daily. 08/30/21   Biagio Borg, MD  levothyroxine (SYNTHROID) 125 MCG tablet Take 1 tablet (125 mcg total) by mouth daily. 08/30/21   Biagio Borg, MD  QUEtiapine (SEROQUEL) 25 MG tablet Take 1 tablet (25 mg total) by mouth at bedtime. 08/26/21   Biagio Borg, MD                                                                                                                                    Past Surgical History Past Surgical History:  Procedure Laterality Date   BREAST BIOPSY Right 09/20/2016   malignant   ERCP W/ SPHICTEROTOMY     pt denies   HEMORRHOID SURGERY     pt  denies   IR URETERAL STENT RIGHT NEW ACCESS W/O SEP NEPHROSTOMY CATH  02/09/2020   MASTECTOMY Right 01/11/2017   MASTECTOMY W/ SENTINEL NODE BIOPSY Right 01/11/2017   Procedure: RIGHT MASTECTOMY WITH SENTINEL LYMPH NODE BIOPSY;  Surgeon: [name] Klein, MD;  Location: [place];  Service: General;  Laterality: Right;   NEPHROLITHOTOMY Right 02/09/2020   Procedure: NEPHROLITHOTOMY PERCUTANEOUS;  Surgeon: Kathie Rhodes, MD;  Location: [facility];  Service: Urology;  Laterality: Right;   PACEMAKER PLACEMENT Left  MedTronic EnPulse [id number] no heartbeat without pacemaker   PERMANENT PACEMAKER GENERATOR CHANGE N/A 07/16/2012   Procedure: PERMANENT PACEMAKER GENERATOR CHANGE;  Surgeon: Deboraha Sprang, MD;  Location: [facility] C[facility] LAB;  Service: Cardiovascular;  Laterality: N/A;   POLYPECTOMY     Uterine   TOTAL HIP ARTHROPLASTY Right 04/28/2019   Procedure: RIGHT TOTAL HIP ARTHROPLASTY ANTERIOR APPROACH;  Surgeon: Leandrew Koyanagi, MD;  Location: [place];  Service: Orthopedics;  Laterality: Right;   Family History Family History  Problem Relation Age of Onset   Sudden death Mother        d.60s   Breast cancer Mother 63   Pulmonary embolism Father        Possible   Lung cancer Paternal Grandfather        history of smoking   Breast cancer Maternal Aunt        d.78s    Social History Social History   Tobacco Use   Smoking status: Former    Packs/day: 0.20    Years: 25.00    Pack years: 5.00    Types: Cigarettes    Quit date: 11/21/1971    Years since quitting: 49.9   Smokeless tobacco: Never  Vaping Use   Vaping Use: Never used  Substance Use Topics   Alcohol use: Yes    Comment: wine   Drug use: No   Allergies Codeine and Oxycodone  Review of Systems Review of Systems  Psychiatric/Behavioral:  Positive for confusion.   Unable to obtain due to altered mental status Physical Exam Vital Signs  I have reviewed the triage vital signs BP (!) 154/66 (BP Location: Right Arm)   Pulse 76   Temp  98.4 F (36.9 C) (Oral)   Resp 16   Ht 5' 5" (1.651 m)   Wt 59 kg   SpO2 99%   BMI 21.63 kg/m   Physical Exam Vitals reviewed.  Constitutional:      General: She is not in acute distress.    Appearance: She is well-developed. She is not diaphoretic.  HENT:     Head: Normocephalic and atraumatic.     Nose: Nose normal.  Eyes:     General: No scleral icterus.       Right eye: No discharge.        Left eye: No discharge.     Conjunctiva/sclera: Conjunctivae normal.     Pupils: Pupils are equal, round, and reactive to light.  Cardiovascular:     Rate and Rhythm: Normal rate and regular rhythm.     Heart sounds: No murmur heard.   No friction rub. No gallop.  Pulmonary:     Effort: Pulmonary effort is normal. No respiratory distress.     Breath sounds: Normal breath sounds. No stridor. No rales.  Abdominal:     General: There is no distension.     Palpations: Abdomen is soft.     Tenderness: There is no abdominal tenderness.  Musculoskeletal:        General: No tenderness.     Cervical back: Normal range of motion and neck supple.  Skin:    General: Skin is warm and dry.     Findings: No erythema or rash.  Neurological:     Mental Status: She is alert and oriented to person, place, and time.     Comments: Mental Status:  Alert and oriented to person. Thinks she lives in [place]; year is 2023; cannot remember month; knows [place]ette Or is the president Attention  and concentration normal.  Speech clear.  Recent memory is intact  Cranial Nerves:  II Visual Fields: Intact to confrontation. Visual fields intact. III, IV, VI: Pupils equal and reactive to light and near. Full eye movement without nystagmus  V Facial Sensation: Normal. No weakness of masticatory muscles  VII: No facial weakness or asymmetry  VIII Auditory Acuity: Grossly normal  IX/X: The uvula is midline; the palate elevates symmetrically  XI: Normal sternocleidomastoid and trapezius strength  XII: The tongue is  midline. No atrophy or fasciculations.   Motor System: Muscle Strength: 5/5 and symmetric in the upper and lower extremities. No pronation or drift.  Muscle Tone: Tone and muscle bulk are normal in the upper and lower extremities.  Coordination: Intact finger-to-nose. No tremor.  Sensation: Intact to light touch. Gait: deferred     ED Results and Treatments Labs (all labs ordered are listed, but only abnormal results are displayed) Labs Reviewed  CBC WITH DIFFERENTIAL/PLATELET - Abnormal; Notable for the following components:      Result Value   RBC 3.10 (*)    Hemoglobin 10.5 (*)    HCT 31.5 (*)    MCV 101.6 (*)    Platelets 146 (*)    All other components within normal limits  COMPREHENSIVE METABOLIC PANEL - Abnormal; Notable for the following components:   Glucose, Bld 113 (*)    BUN 29 (*)    Total Protein 5.9 (*)    All other components within normal limits  URINALYSIS, ROUTINE W REFLEX MICROSCOPIC - Abnormal; Notable for the following components:   APPearance HAZY (*)    Hgb urine dipstick SMALL (*)    Leukocytes,Ua MODERATE (*)    All other components within normal limits  TSH - Abnormal; Notable for the following components:   TSH 0.085 (*)    All other components within normal limits  T4, FREE - Abnormal; Notable for the following components:   Free T4 3.48 (*)    All other components within normal limits  I-STAT VENOUS BLOOD GAS, ED - Abnormal; Notable for the following components:   pO2, Ven 28.0 (*)    Bicarbonate 29.1 (*)    Acid-Base Excess 4.0 (*)    HCT 30.0 (*)    Hemoglobin 10.2 (*)    All other components within normal limits  RESP PANEL BY RT-PCR (FLU A&B, COVID) ARPGX2  AMMONIA  ETHANOL  HEAVY METALS, BLOOD                                                                                                                         EKG  EKG Interpretation  Date/Time:  Saturday October 22 2021 23:37:26 EST Ventricular Rate:  67 PR  Interval:  144 QRS Duration: 179 QT Interval:  456 QTC Calculation: 482 R Axis:   -85 Text Interpretation: ventricularly paced Nonspecific IVCD with LAD Left ventricular hypertrophy Confirmed by [place] Lank [phone]) on 10/23/2021 1:35:35 AM       Radiology CT  Head Wo Contrast  Result Date: 10/23/2021 CLINICAL DATA:  Altered mental status EXAM: CT HEAD WITHOUT CONTRAST TECHNIQUE: Contiguous axial images were obtained from the base of the skull through the vertex without intravenous contrast. COMPARISON:  07/27/2010 FINDINGS: Brain: No evidence of acute infarction, hemorrhage, hydrocephalus, extra-axial collection or mass lesion/mass effect. Mild atrophic changes and chronic white matter ischemic changes are seen. Vascular: No hyperdense vessel or unexpected calcification. Skull: Normal. Negative for fracture or focal lesion. Sinuses/Orbits: No acute finding. Other: None. IMPRESSION: Chronic atrophic and ischemic changes without acute abnormality. Electronically Signed   By: Inez Catalina M.D.   On: 10/23/2021 02:14    Pertinent labs & imaging results that were available during my care of the patient were reviewed by me and considered in my medical decision making (see MDM for details).  Medications Ordered in ED Medications - No data to display                                                                                                                                   Procedures Procedures  (including critical care time)  Medical Decision Making / ED Course I have reviewed the nursing notes for this encounter and the patient's prior records (if available in EHR or on provided paperwork).  TERRAH DECOSTER was evaluated in Emergency Department on 10/23/2021 for the symptoms described in the history of present illness. She was evaluated in the context of the global COVID-19 pandemic, which necessitated consideration that the patient might be at risk for infection with the SARS-CoV-2  virus that causes COVID-19. Institutional protocols and algorithms that pertain to the evaluation of patients at risk for COVID-19 are in a state of rapid change based on information released by regulatory bodies including the CDC and federal and state organizations. These policies and algorithms were followed during the patient's care in the ED.     Gradually worsening cognitive and behavioral changes over the past several months, increasing in severity. Differential is broad. CT head obtained to rule out intracranial mass given her history of breast cancer.  CT head negative. Work-up negative for infection. Doubt meningitis or encephalitis given the indolent picture. No significant electrolyte derangements or renal sufficiency.  No evidence of hepatic encephalopathy. No evidence of myxedema. Given patient's history of hip hip replacement, heavy metal panel send out lab obtain.  Primary care provider linked with home she will be able to follow-up results.  I updated patient's daughter who was present for part of the work-up.  Daughter brought in patient's medications.  I personally went through all the medications and noted that they were over 15 bottles of various prescriptions including narcotics, antiemetics, blood pressure and cholesterol medication ranging from 2003-2021 that still had numerous pills in the bottle.  I found patient's current medication except her levothyroxine.  With the approval of the patient and family, I discarded all of the out of date prescriptions  that were bagged, sealed, and sent to pharmacy to discard.  There does not appear to be an emergent cause of the patient's etiology at this time.  If the rest of the outpatient work-up is negative for any organic causes, we may need to consider a new onset dementia or psychosis from anxiety/depression possible triggered (or worsened) due to recent loss of husband.  Outpatient neurocognitive assessment will likely be  beneficial.  I have placed an order for transitional care team consultation to assist with home health/physical therapy.  Patient already has a scheduled appointment with her primary care provider this upcoming week.  She has care at home.  Feels she is appropriate for continued outpatient management.  Pertinent labs & imaging results that were available during my care of the patient were reviewed by me and considered in my medical decision making:    Final Clinical Impression(s) / ED Diagnoses Final diagnoses:  Disorientation  Cognitive and behavioral changes   The patient appears reasonably screened and/or stabilized for discharge and I doubt any other medical condition or other [facility] requiring further screening, evaluation, or treatment in the ED at this time prior to discharge. Safe for discharge with strict return precautions.  Disposition: Discharge  Condition: Good  I have discussed the results, Dx and Tx plan with the patient/family who expressed understanding and agree(s) with the plan. Discharge instructions discussed at length. The patient/family was given strict return precautions who verbalized understanding of the instructions. No further questions at time of discharge.    ED Discharge Orders     None        Follow Up: Biagio Borg, MD [place] [address] [phone]  Go on 10/27/2021 as scheduled  [facility] [address]     [place] [id number] [phone] Call  to see if they perform neurocognitive evaluation if the primary care provider does not have a referral for you.     This chart was dictated using voice recognition software.  Despite best efforts to proofread,  errors can occur which can change the documentation meaning.    Fatima Blank, MD 10/23/21 [phone]

## 2021-10-22 NOTE — ED Triage Notes (Signed)
PTAR called to residence because daughter states Pt has been acting abnormal and confused last 2 days, worsening today.

## 2021-10-23 ENCOUNTER — Emergency Department (HOSPITAL_COMMUNITY): Payer: Medicare HMO

## 2021-10-23 DIAGNOSIS — R4182 Altered mental status, unspecified: Secondary | ICD-10-CM | POA: Diagnosis not present

## 2021-10-23 LAB — URINALYSIS, ROUTINE W REFLEX MICROSCOPIC
Bacteria, UA: NONE SEEN
Bilirubin Urine: NEGATIVE
Glucose, UA: NEGATIVE mg/dL
Ketones, ur: NEGATIVE mg/dL
Nitrite: NEGATIVE
Protein, ur: NEGATIVE mg/dL
Specific Gravity, Urine: 1.024 (ref 1.005–1.030)
pH: 5 (ref 5.0–8.0)

## 2021-10-23 LAB — CBC WITH DIFFERENTIAL/PLATELET
Abs Immature Granulocytes: 0.01 10*3/uL (ref 0.00–0.07)
Basophils Absolute: 0 10*3/uL (ref 0.0–0.1)
Basophils Relative: 1 %
Eosinophils Absolute: 0.1 10*3/uL (ref 0.0–0.5)
Eosinophils Relative: 1 %
HCT: 31.5 % — ABNORMAL LOW (ref 36.0–46.0)
Hemoglobin: 10.5 g/dL — ABNORMAL LOW (ref 12.0–15.0)
Immature Granulocytes: 0 %
Lymphocytes Relative: 21 %
Lymphs Abs: 1.3 10*3/uL (ref 0.7–4.0)
MCH: 33.9 pg (ref 26.0–34.0)
MCHC: 33.3 g/dL (ref 30.0–36.0)
MCV: 101.6 fL — ABNORMAL HIGH (ref 80.0–100.0)
Monocytes Absolute: 0.7 10*3/uL (ref 0.1–1.0)
Monocytes Relative: 12 %
Neutro Abs: 4.1 10*3/uL (ref 1.7–7.7)
Neutrophils Relative %: 65 %
Platelets: 146 10*3/uL — ABNORMAL LOW (ref 150–400)
RBC: 3.1 MIL/uL — ABNORMAL LOW (ref 3.87–5.11)
RDW: 11.7 % (ref 11.5–15.5)
WBC: 6.3 10*3/uL (ref 4.0–10.5)
nRBC: 0 % (ref 0.0–0.2)

## 2021-10-23 LAB — COMPREHENSIVE METABOLIC PANEL
ALT: 31 U/L (ref 0–44)
AST: 27 U/L (ref 15–41)
Albumin: 4 g/dL (ref 3.5–5.0)
Alkaline Phosphatase: 56 U/L (ref 38–126)
Anion gap: 5 (ref 5–15)
BUN: 29 mg/dL — ABNORMAL HIGH (ref 8–23)
CO2: 29 mmol/L (ref 22–32)
Calcium: 9.9 mg/dL (ref 8.9–10.3)
Chloride: 105 mmol/L (ref 98–111)
Creatinine, Ser: 0.91 mg/dL (ref 0.44–1.00)
GFR, Estimated: >60 mL/min (ref >60)
Glucose, Bld: 113 mg/dL — ABNORMAL HIGH (ref 70–99)
Potassium: 4.9 mmol/L (ref 3.5–5.1)
Sodium: 139 mmol/L (ref 135–145)
Total Bilirubin: 1 mg/dL (ref 0.3–1.2)
Total Protein: 5.9 g/dL — ABNORMAL LOW (ref 6.5–8.1)

## 2021-10-23 LAB — RESP PANEL BY RT-PCR (FLU A&B, COVID) ARPGX2
Influenza A by PCR: NEGATIVE
Influenza B by PCR: NEGATIVE
SARS Coronavirus 2 by RT PCR: NEGATIVE

## 2021-10-23 LAB — T4, FREE: Free T4: 3.48 ng/dL — ABNORMAL HIGH (ref 0.61–1.12)

## 2021-10-23 LAB — TSH: TSH: 0.085 u[IU]/mL — ABNORMAL LOW (ref 0.350–4.500)

## 2021-10-23 LAB — ETHANOL: Alcohol, Ethyl (B): <10 mg/dL (ref <10)

## 2021-10-23 LAB — AMMONIA: Ammonia: 17 umol/L (ref 9–35)

## 2021-10-23 NOTE — Discharge Instructions (Addendum)
I have consulted our transitional care team to help set up home health and physical therapy.  They will be in touch within the next 48 to 72 hours.  Please follow-up with your primary care provider for referral to neurocognitive specialist.  I have provided you contact information for [facility].  You may call them to see if they perform neurocognitive testing if your primary care provider does not have a referral for you.  I have also ordered a heavy metal panel which the primary care provider should be able to follow-up on.  I have gone through all your medications and have discarded the expired and unnecessary prescriptions.

## 2021-10-23 NOTE — Progress Notes (Signed)
Ok will do.

## 2021-10-23 NOTE — Care Management (Signed)
Patient ordered Home Health, messaged [facility] to accept for PT OT RN, aide and social work.

## 2021-10-24 ENCOUNTER — Telehealth: Payer: Self-pay | Admitting: Internal Medicine

## 2021-10-24 NOTE — Telephone Encounter (Signed)
Patient's daughter informing provider patient was seen in the ed on 12-3  Patient scheduled for ov on 12-8

## 2021-10-25 LAB — HEAVY METALS, BLOOD
Arsenic: 1 ug/L (ref 0–9)
Lead: 1.4 ug/dL (ref 0.0–3.4)
Mercury: 1.1 ug/L (ref 0.0–14.9)

## 2021-10-27 ENCOUNTER — Encounter: Payer: Self-pay | Admitting: Internal Medicine

## 2021-10-27 ENCOUNTER — Ambulatory Visit (INDEPENDENT_AMBULATORY_CARE_PROVIDER_SITE_OTHER): Payer: Medicare HMO | Admitting: Internal Medicine

## 2021-10-27 ENCOUNTER — Other Ambulatory Visit: Payer: Self-pay

## 2021-10-27 VITALS — BP 122/68 | HR 71 | Temp 98.3°F | Ht 65.0 in | Wt 103.0 lb

## 2021-10-27 DIAGNOSIS — I10 Essential (primary) hypertension: Secondary | ICD-10-CM

## 2021-10-27 DIAGNOSIS — R7302 Impaired glucose tolerance (oral): Secondary | ICD-10-CM

## 2021-10-27 DIAGNOSIS — E7800 Pure hypercholesterolemia, unspecified: Secondary | ICD-10-CM

## 2021-10-27 DIAGNOSIS — F03918 Unspecified dementia, unspecified severity, with other behavioral disturbance: Secondary | ICD-10-CM

## 2021-10-27 DIAGNOSIS — Z7189 Other specified counseling: Secondary | ICD-10-CM | POA: Diagnosis not present

## 2021-10-27 DIAGNOSIS — E039 Hypothyroidism, unspecified: Secondary | ICD-10-CM

## 2021-10-27 NOTE — Patient Instructions (Signed)
Please remember to re-check your thyroid testing in 3 weeks at the [facility]  You are given the signed out of hospital DNR form today  Please forward the forms for the [facility] to fax [phone]  Please continue all other medications as before, and refills have been done if requested.  Please have the pharmacy call with any other refills you may need.  Please keep your appointments with your specialists as you may have planned  Please make an Appointment to return in 3 months, or sooner if needed

## 2021-10-27 NOTE — Progress Notes (Signed)
Patient ID: Danielle [name], female   DOB: 01/18/1936, 85 y.o.   MRN: [id number]        Chief Complaint: follow up low thyroid, paranoia, dementia, DNR discussion       HPI:  Danielle [name] is a 85 y.o. female here with daughter for support and hx; pt is living alone but has worsening memory loss with confusion, fear reaction, paranoia but has remained adequate in her ADLs except unable to prepare her meals with cooking.. Lost wt from 116 in oct 2022 to current 103.   Pt denies chest pain, increased sob or doe, wheezing, orthopnea, PND, increased LE swelling, palpitations, dizziness or syncope.   Pt denies polydipsia, polyuria, or new focal neuro s/s.   Denies hyper or hypo thyroid symptoms such as voice, skin or hair change, but recent TFTs at ED visit are c/w overcontrol.  Pt and daughter agree with DNR as no heroic measures are to be taken in the event of cardiopulmonary cessation.  Pt denies fever, night sweats, or other constitutional symptoms except states she has lost some appetite recently.  Denies worsening depressive symptoms, suicidal ideation, or panic; has ongoing anxiet     Wt Readings from Last 3 Encounters:  10/27/21 103 lb (46.7 kg)  10/22/21 130 lb (59 kg)  08/26/21 116 lb (52.6 kg)   BP Readings from Last 3 Encounters:  10/27/21 122/68  10/23/21 (!) 154/66  08/26/21 122/68         Past Medical History:  Diagnosis Date   ALLERGIC RHINITIS 01/09/2008   ANEMIA-NOS 01/09/2008   pt doesn't remember   ANXIETY 01/09/2008   ASTHMATIC BRONCHITIS, ACUTE 10/20/2008   AV BLOCK, COMPLETE 12/16/2009   Breast cancer, right ([place])    DEPRESSION 01/09/2008   Dizziness and giddiness 02/25/2010   DVT, HX OF 01/09/2008   after delivery of 4 th child   Genetic testing 06/04/2017   Ms. Onstott underwent genetic counseling and testing for hereditary cancer syndromes on 05/24/2017. Her results were negative for mutations in all 46 genes analyzed by [employer]'s 46-gene Common Hereditary Cancers  Panel. Genes analyzed include: APC, ATM, AXIN2, BARD1, BMPR1A, BRCA1, BRCA2, BRIP1, CDH1, CDKN2A, CHEK2, CTNNA1, DICER1, EPCAM, GREM1, HOXB13, KIT, MEN1, MLH1, MSH2, MSH3, MSH6, MUTYH, NBN   GERD 01/09/2008   GLUCOSE INTOLERANCE 01/09/2008   History of kidney stones    HYPERLIPIDEMIA 01/09/2008   HYPERTENSION 01/09/2008   HYPOTHYROIDISM 01/09/2008   Impaired glucose tolerance 07/28/2011   INSOMNIA-SLEEP DISORDER-UNSPEC 06/15/2009   NEPHROLITHIASIS, HX OF 01/09/2008   OSTEOARTHRITIS, CERVICAL SPINE 02/25/2010   Presence of permanent cardiac pacemaker    medtronic, pacemaker dependent   Urine incontinence    Past Surgical History:  Procedure Laterality Date   BREAST BIOPSY Right 09/20/2016   malignant   ERCP W/ SPHICTEROTOMY     pt denies   HEMORRHOID SURGERY     pt denies   IR URETERAL STENT RIGHT NEW ACCESS W/O SEP NEPHROSTOMY CATH  02/09/2020   MASTECTOMY Right 01/11/2017   MASTECTOMY W/ SENTINEL NODE BIOPSY Right 01/11/2017   Procedure: RIGHT MASTECTOMY WITH SENTINEL LYMPH NODE BIOPSY;  Surgeon: [name] Klein, MD;  Location: [facility];  Service: General;  Laterality: Right;   NEPHROLITHOTOMY Right 02/09/2020   Procedure: NEPHROLITHOTOMY PERCUTANEOUS;  Surgeon: Kathie Rhodes, MD;  Location: [facility];  Service: Urology;  Laterality: Right;   PACEMAKER PLACEMENT Left    MedTronic EnPulse E2DR01--06 no heartbeat without pacemaker   PERMANENT PACEMAKER GENERATOR CHANGE N/A 07/16/2012   Procedure:  PERMANENT PACEMAKER GENERATOR CHANGE;  Surgeon: Deboraha Sprang, MD;  Location: [facility] C[facility] LAB;  Service: Cardiovascular;  Laterality: N/A;   POLYPECTOMY     Uterine   TOTAL HIP ARTHROPLASTY Right 04/28/2019   Procedure: RIGHT TOTAL HIP ARTHROPLASTY ANTERIOR APPROACH;  Surgeon: Leandrew Koyanagi, MD;  Location: [facility];  Service: Orthopedics;  Laterality: Right;    reports that she quit smoking about 49 years ago. Her smoking use included cigarettes. She has a 5.00 pack-year smoking history. She has never used smokeless  tobacco. She reports current alcohol use. She reports that she does not use drugs. family history includes Breast cancer in her maternal aunt; Breast cancer (age of onset: 66) in her mother; Lung cancer in her paternal grandfather; Pulmonary embolism in her father; Sudden death in her mother. Allergies  Allergen Reactions   Codeine Nausea And Vomiting   Oxycodone Nausea And Vomiting   Current Outpatient Medications on File Prior to Visit  Medication Sig Dispense Refill   atenolol (TENORMIN) 50 MG tablet Take 1 tablet (50 mg total) by mouth daily. 90 tablet 3   atorvastatin (LIPITOR) 10 MG tablet TAKE 1 TABLET(10 MG) BY MOUTH DAILY 90 tablet 3   azelastine (ASTELIN) 0.1 % nasal spray Place 1 spray into both nostrils as needed (Summer Allergies). 30 mL 11   calcium-vitamin D (OSCAL WITH D) 500-200 MG-UNIT tablet Take 1 tablet by mouth daily with breakfast. 90 tablet 3   enalapril (VASOTEC) 10 MG tablet Take 1 tablet (10 mg total) by mouth daily. 90 tablet 3   gabapentin (NEURONTIN) 100 MG capsule Take 1 capsule (100 mg total) by mouth 3 (three) times daily. 90 capsule 3   levothyroxine (SYNTHROID) 125 MCG tablet Take 1 tablet (125 mcg total) by mouth daily. 90 tablet 3   QUEtiapine (SEROQUEL) 25 MG tablet Take 1 tablet (25 mg total) by mouth at bedtime. 90 tablet 3   No current facility-administered medications on file prior to visit.        ROS:  All others reviewed and negative.  Objective        PE:  BP 122/68 (BP Location: Right Arm, Patient Position: Sitting, Cuff Size: Normal)   Pulse 71   Temp 98.3 F (36.8 C) (Oral)   Ht _0  (1.651 m)   Wt 103 lb (46.7 kg)   SpO2 98%   BMI 17.14 kg/m                 Constitutional: Pt appears in NAD               HENT: Head: NCAT.                Right Ear: External ear normal.                 Left Ear: External ear normal.                Eyes: . Pupils are equal, round, and reactive to light. Conjunctivae and EOM are normal                Nose: without d/c or deformity               Neck: Neck supple. Gross normal ROM               Cardiovascular: Normal rate and regular rhythm.                 Pulmonary/Chest: Effort normal and  breath sounds without rales or wheezing.                Abd:  Soft, NT, ND, + BS, no organomegaly               Neurological: Pt is alert. At baseline orientation, motor grossly intact               Skin: Skin is warm. No rashes, no other new lesions, LE edema - none               Psychiatric: Pt behavior is normal without agitation today  Micro: none  Cardiac tracings I have personally interpreted today:  none  Pertinent Radiological findings (summarize): none   Lab Results  Component Value Date   WBC 6.3 10/22/2021   HGB 10.2 (L) 10/22/2021   HCT 30.0 (L) 10/22/2021   PLT 146 (L) 10/22/2021   GLUCOSE 113 (H) 10/22/2021   CHOL 194 09/11/2019   TRIG 95.0 09/11/2019   HDL 75.30 09/11/2019   LDLDIRECT 128.0 05/11/2016   LDLCALC 100 (H) 09/11/2019   ALT 31 10/22/2021   AST 27 10/22/2021   NA 139 10/22/2021   K 4.8 10/22/2021   CL 105 10/22/2021   CREATININE 0.91 10/22/2021   BUN 29 (H) 10/22/2021   CO2 29 10/22/2021   TSH 0.085 (L) 10/22/2021   INR 1.0 02/09/2020   HGBA1C 6.1 09/11/2019   Assessment/Plan:  JENICKA COXE is a 85 y.o. White or Caucasian [1] female with  has a past medical history of ALLERGIC RHINITIS (01/09/2008), ANEMIA-NOS (01/09/2008), ANXIETY (01/09/2008), ASTHMATIC BRONCHITIS, ACUTE (10/20/2008), AV BLOCK, COMPLETE (12/16/2009), Breast cancer, right ([place]), DEPRESSION (01/09/2008), Dizziness and giddiness (02/25/2010), DVT, HX OF (01/09/2008), Genetic testing (06/04/2017), GERD (01/09/2008), GLUCOSE INTOLERANCE (01/09/2008), History of kidney stones, HYPERLIPIDEMIA (01/09/2008), HYPERTENSION (01/09/2008), HYPOTHYROIDISM (01/09/2008), Impaired glucose tolerance (07/28/2011), INSOMNIA-SLEEP DISORDER-UNSPEC (06/15/2009), NEPHROLITHIASIS, HX OF (01/09/2008), OSTEOARTHRITIS, CERVICAL  SPINE (02/25/2010), Presence of permanent cardiac pacemaker, and Urine incontinence.  Hypothyroidism ? overcontrolled but there is some question of meds not taken correcetly - daughter now administering meds for the past wk;  Will continue this supervision and recheck TFts in 3 more wks  Hyperlipidemia Lab Results  Component Value Date   LDLCALC 100 (H) 09/11/2019   Mild uncontrolled, goal ldl < 100, pt to continue current statin lipitor 10 as declines change , for f/u lipids next labs   Essential hypertension BP Readings from Last 3 Encounters:  10/27/21 122/68  10/23/21 (!) 154/66  08/26/21 122/68   Stable, pt to continue medical treatment tenormin 50   Impaired glucose tolerance Lab Results  Component Value Date   HGBA1C 6.1 09/11/2019   Stable, pt to continue current medical treatment  - diet, for a1c with next labs   Dementia with behavioral disturbance With recent worsening, for thyroid testing as above, o/w consider increased seroquel for worsening behavior and/or psychiatry referral, declines aricept or MRI brain or neurology for now  DNR (do not resuscitate) discussion Ok for DNR out of hospital form signed; pt has long term care insurance and likely to need to apply to active soon as now unable for self admin or understand her meds, unable to prepare own meals with recent wt loss  Followup: Return in about 3 months (around 01/25/2022).  Cathlean Cower, MD 11/01/2021 4:44 AM [facility]

## 2021-11-01 ENCOUNTER — Encounter: Payer: Self-pay | Admitting: Internal Medicine

## 2021-11-01 DIAGNOSIS — Z7189 Other specified counseling: Secondary | ICD-10-CM | POA: Insufficient documentation

## 2021-11-01 DIAGNOSIS — F03918 Unspecified dementia, unspecified severity, with other behavioral disturbance: Secondary | ICD-10-CM | POA: Insufficient documentation

## 2021-11-01 NOTE — Assessment & Plan Note (Signed)
With recent worsening, for thyroid testing as above, o/w consider increased seroquel for worsening behavior and/or psychiatry referral, declines aricept or MRI brain or neurology for now

## 2021-11-01 NOTE — Assessment & Plan Note (Signed)
BP Readings from Last 3 Encounters:  10/27/21 122/68  10/23/21 (!) 154/66  08/26/21 122/68   Stable, pt to continue medical treatment tenormin 50

## 2021-11-01 NOTE — Assessment & Plan Note (Signed)
Lab Results  Component Value Date   LDLCALC 100 (H) 09/11/2019   Mild uncontrolled, goal ldl < 100, pt to continue current statin lipitor 10 as declines change , for f/u lipids next labs

## 2021-11-01 NOTE — Assessment & Plan Note (Signed)
Lab Results  Component Value Date   HGBA1C 6.1 09/11/2019   Stable, pt to continue current medical treatment  - diet, for a1c with next labs

## 2021-11-01 NOTE — Assessment & Plan Note (Signed)
[place] for DNR out of hospital form signed; pt has long term care insurance and likely to need to apply to active soon as now unable for self admin or understand her meds, unable to prepare own meals with recent wt loss

## 2021-11-01 NOTE — Assessment & Plan Note (Signed)
?   overcontrolled but there is some question of meds not taken correcetly - daughter now administering meds for the past wk;  Will continue this supervision and recheck TFts in 3 more wks

## 2021-11-05 ENCOUNTER — Emergency Department (HOSPITAL_COMMUNITY): Payer: Medicare HMO

## 2021-11-05 ENCOUNTER — Encounter (HOSPITAL_COMMUNITY): Payer: Self-pay

## 2021-11-05 ENCOUNTER — Emergency Department (HOSPITAL_COMMUNITY)
Admission: EM | Admit: 2021-11-05 | Discharge: 2021-11-06 | Discharge status: Against Medical Advice | Payer: Medicare HMO | Attending: Physician Assistant | Admitting: Physician Assistant

## 2021-11-05 ENCOUNTER — Encounter: Payer: Self-pay | Admitting: Emergency Medicine

## 2021-11-05 ENCOUNTER — Emergency Department (HOSPITAL_BASED_OUTPATIENT_CLINIC_OR_DEPARTMENT_OTHER): Payer: Medicare HMO

## 2021-11-05 ENCOUNTER — Other Ambulatory Visit: Payer: Self-pay

## 2021-11-05 ENCOUNTER — Ambulatory Visit
Admission: EM | Admit: 2021-11-05 | Discharge: 2021-11-05 | Disposition: A | Discharge status: Home / Self Care | Payer: Medicare HMO

## 2021-11-05 DIAGNOSIS — J069 Acute upper respiratory infection, unspecified: Secondary | ICD-10-CM

## 2021-11-05 DIAGNOSIS — Z5321 Procedure and treatment not carried out due to patient leaving prior to being seen by health care provider: Secondary | ICD-10-CM | POA: Diagnosis not present

## 2021-11-05 DIAGNOSIS — R3589 Other polyuria: Secondary | ICD-10-CM

## 2021-11-05 DIAGNOSIS — R600 Localized edema: Secondary | ICD-10-CM | POA: Insufficient documentation

## 2021-11-05 DIAGNOSIS — U071 COVID-19: Secondary | ICD-10-CM | POA: Diagnosis not present

## 2021-11-05 DIAGNOSIS — R32 Unspecified urinary incontinence: Secondary | ICD-10-CM | POA: Diagnosis not present

## 2021-11-05 DIAGNOSIS — M79662 Pain in left lower leg: Secondary | ICD-10-CM | POA: Insufficient documentation

## 2021-11-05 DIAGNOSIS — R059 Cough, unspecified: Secondary | ICD-10-CM

## 2021-11-05 DIAGNOSIS — R609 Edema, unspecified: Secondary | ICD-10-CM | POA: Diagnosis not present

## 2021-11-05 DIAGNOSIS — R109 Unspecified abdominal pain: Secondary | ICD-10-CM | POA: Diagnosis not present

## 2021-11-05 LAB — RESP PANEL BY RT-PCR (FLU A&B, COVID) ARPGX2
Influenza A by PCR: NEGATIVE
Influenza B by PCR: NEGATIVE
SARS Coronavirus 2 by RT PCR: POSITIVE — AB

## 2021-11-05 LAB — CBC WITH DIFFERENTIAL/PLATELET
Abs Immature Granulocytes: 0 10*3/uL (ref 0.00–0.07)
Basophils Absolute: 0 10*3/uL (ref 0.0–0.1)
Basophils Relative: 1 %
Eosinophils Absolute: 0 10*3/uL (ref 0.0–0.5)
Eosinophils Relative: 0 %
HCT: 35 % — ABNORMAL LOW (ref 36.0–46.0)
Hemoglobin: 11.3 g/dL — ABNORMAL LOW (ref 12.0–15.0)
Immature Granulocytes: 0 %
Lymphocytes Relative: 14 %
Lymphs Abs: 0.7 10*3/uL (ref 0.7–4.0)
MCH: 33.4 pg (ref 26.0–34.0)
MCHC: 32.3 g/dL (ref 30.0–36.0)
MCV: 103.6 fL — ABNORMAL HIGH (ref 80.0–100.0)
Monocytes Absolute: 1.1 10*3/uL — ABNORMAL HIGH (ref 0.1–1.0)
Monocytes Relative: 23 %
Neutro Abs: 3 10*3/uL (ref 1.7–7.7)
Neutrophils Relative %: 62 %
Platelets: 156 10*3/uL (ref 150–400)
RBC: 3.38 MIL/uL — ABNORMAL LOW (ref 3.87–5.11)
RDW: 11.9 % (ref 11.5–15.5)
WBC: 4.8 10*3/uL (ref 4.0–10.5)
nRBC: 0 % (ref 0.0–0.2)

## 2021-11-05 LAB — COMPREHENSIVE METABOLIC PANEL
ALT: 26 U/L (ref 0–44)
AST: 21 U/L (ref 15–41)
Albumin: 4.3 g/dL (ref 3.5–5.0)
Alkaline Phosphatase: 57 U/L (ref 38–126)
Anion gap: 10 (ref 5–15)
BUN: 22 mg/dL (ref 8–23)
CO2: 24 mmol/L (ref 22–32)
Calcium: 9.5 mg/dL (ref 8.9–10.3)
Chloride: 104 mmol/L (ref 98–111)
Creatinine, Ser: 0.87 mg/dL (ref 0.44–1.00)
GFR, Estimated: >60 mL/min (ref >60)
Glucose, Bld: 97 mg/dL (ref 70–99)
Potassium: 4 mmol/L (ref 3.5–5.1)
Sodium: 138 mmol/L (ref 135–145)
Total Bilirubin: 1.1 mg/dL (ref 0.3–1.2)
Total Protein: 6.9 g/dL (ref 6.5–8.1)

## 2021-11-05 LAB — URINALYSIS, ROUTINE W REFLEX MICROSCOPIC
Bilirubin Urine: NEGATIVE
Glucose, UA: NEGATIVE mg/dL
Hgb urine dipstick: NEGATIVE
Ketones, ur: NEGATIVE mg/dL
Leukocytes,Ua: NEGATIVE
Nitrite: NEGATIVE
Protein, ur: NEGATIVE mg/dL
Specific Gravity, Urine: 1.015 (ref 1.005–1.030)
pH: 7 (ref 5.0–8.0)

## 2021-11-05 LAB — LIPASE, BLOOD: Lipase: 62 U/L — ABNORMAL HIGH (ref 11–51)

## 2021-11-05 NOTE — ED Provider Notes (Signed)
[facility]    CSN: [id number] Arrival date & time: 11/05/21  1019      History   Chief Complaint Chief Complaint  Patient presents with   Diarrhea    HPI Danielle [name] is a 85 y.o. female.   Patient presents with increased urine volume, body aches, fever, nasal congestion, cough, left lower leg and calf pain that started a few days ago.  Patient reports that "10,000 gallons of water poured out of me last night".  She reports that she had an incontinence episode on her elf with a large volume of urine.  Her daughter who is present and in room today reports that she is incontinent at times, but they have never seen this amount of urine at one time.  Patient denies any urinary burning or frequency.  She also reports that she has had some upper respiratory symptoms as well as a nonproductive cough over the past few days.  Denies chest pain, shortness of breath, sore throat, ear pain.  Does report a fever but is not sure of T-max at home.  Is also complaining of left lower extremity pain specifically in the calf with associated numbness and tingling.  No apparent injury.  Patient also has elevated blood pressure reading in urgent care today.  Daughter reports that her systolic blood pressure is typically 120s.  She was recently discharged from the hospital approximately 2 weeks ago for altered mental status and is being followed by home health care.   Diarrhea  Past Medical History:  Diagnosis Date   ALLERGIC RHINITIS 01/09/2008   ANEMIA-NOS 01/09/2008   pt doesn't remember   ANXIETY 01/09/2008   ASTHMATIC BRONCHITIS, ACUTE 10/20/2008   AV BLOCK, COMPLETE 12/16/2009   Breast cancer, right ([place])    DEPRESSION 01/09/2008   Dizziness and giddiness 02/25/2010   DVT, HX OF 01/09/2008   after delivery of 4 th child   Genetic testing 06/04/2017   Ms. Buckle underwent genetic counseling and testing for hereditary cancer syndromes on 05/24/2017. Her results were negative for  mutations in all 46 genes analyzed by [facility]'s 46-gene Common Hereditary Cancers Panel. Genes analyzed include: APC, ATM, AXIN2, BARD1, BMPR1A, BRCA1, BRCA2, BRIP1, CDH1, CDKN2A, CHEK2, CTNNA1, DICER1, EPCAM, GREM1, HOXB13, KIT, MEN1, MLH1, MSH2, MSH3, MSH6, MUTYH, NBN   GERD 01/09/2008   GLUCOSE INTOLERANCE 01/09/2008   History of kidney stones    HYPERLIPIDEMIA 01/09/2008   HYPERTENSION 01/09/2008   HYPOTHYROIDISM 01/09/2008   Impaired glucose tolerance 07/28/2011   INSOMNIA-SLEEP DISORDER-UNSPEC 06/15/2009   NEPHROLITHIASIS, HX OF 01/09/2008   OSTEOARTHRITIS, CERVICAL SPINE 02/25/2010   Presence of permanent cardiac pacemaker    medtronic, pacemaker dependent   Urine incontinence     Patient Active Problem List   Diagnosis Date Noted   Dementia with behavioral disturbance 11/01/2021   DNR (do not resuscitate) discussion 11/01/2021   Gait disorder 08/27/2021   Nightmare 08/26/2021   Low back pain 04/21/2021   Trigger finger of left hand 07/14/2020   Trigger finger of right hand 07/14/2020   Arthritis 06/11/2020   Trigger middle finger 06/11/2020   Renal calculus, right 02/09/2020   Kidney stone 02/09/2020   Neck pain 11/04/2019   Status post total hip replacement, right 11/04/2019   Urinary incontinence 10/09/2019   History of total hip arthroplasty 04/28/2019   Degenerative joint disease of right hip 12/17/2018   Genetic testing 06/04/2017   Overlapping malignant neoplasm of female breast ([place]) 04/24/2017   Peripheral venous insufficiency 03/15/2017  Subungual hematoma of foot 03/15/2017   Indigestion 03/07/2017   Nausea & vomiting 02/22/2017   Cough 02/22/2017   Blood in urine 02/22/2017   Nausea and vomiting 02/22/2017   S/P right mastectomy 01/11/2017   History of right mastectomy 01/11/2017   Malignant neoplasm of upper-inner quadrant of female breast ([place]) 10/06/2016   Right hip pain 07/18/2016   Olecranon bursitis of left elbow 05/05/2015   Alopecia 01/21/2015   Left  leg swelling 01/21/2015   Cervical radiculopathy 07/11/2013   Encounter for well adult exam with abnormal findings 08/03/2012   Dizziness 08/03/2012   Rash 03/26/2012   Pacemaker-medtronic 01/23/2012   Cardiac pacemaker in situ 01/23/2012   Impaired glucose tolerance 07/28/2011   OSTEOARTHRITIS, CERVICAL SPINE 02/25/2010   Dizziness and giddiness 02/25/2010   Spondylosis 02/25/2010   Complete atrioventricular block ([place]) 12/16/2009   Insomnia 06/15/2009   Hypothyroidism 01/09/2008   Hyperlipidemia 01/09/2008   Anemia 01/09/2008   Anxiety state 01/09/2008   Depressive disorder 01/09/2008   Essential hypertension 01/09/2008   Allergic rhinitis 01/09/2008   Gastroesophageal reflux disease 01/09/2008   DVT, HX OF 01/09/2008   NEPHROLITHIASIS, HX OF 01/09/2008   History of urinary stone 01/09/2008   History of thromboembolism of vein 01/09/2008    Past Surgical History:  Procedure Laterality Date   BREAST BIOPSY Right 09/20/2016   malignant   ERCP W/ SPHICTEROTOMY     pt denies   HEMORRHOID SURGERY     pt denies   IR URETERAL STENT RIGHT NEW ACCESS W/O SEP NEPHROSTOMY CATH  02/09/2020   MASTECTOMY Right 01/11/2017   MASTECTOMY W/ SENTINEL NODE BIOPSY Right 01/11/2017   Procedure: RIGHT MASTECTOMY WITH SENTINEL LYMPH NODE BIOPSY;  Surgeon: [name] Klein, MD;  Location: [facility];  Service: General;  Laterality: Right;   NEPHROLITHOTOMY Right 02/09/2020   Procedure: NEPHROLITHOTOMY PERCUTANEOUS;  Surgeon: Kathie Rhodes, MD;  Location[facility];  Service: Urology;  Laterality: Right;   PACEMAKER PLACEMENT Left    MedTronic EnPulse E2DR01--06 no heartbeat without pacemaker   PERMANENT PACEMAKER GENERATOR CHANGE N/A 07/16/2012   Procedure: PERMANENT PACEMAKER GENERATOR CHANGE;  Surgeon: Deboraha Sprang, MD;  Location[facility];  Service: Cardiovascular;  Laterality: N/A;   POLYPECTOMY     Uterine   TOTAL HIP ARTHROPLASTY Right 04/28/2019   Procedure: RIGHT TOTAL HIP ARTHROPLASTY ANTERIOR  APPROACH;  Surgeon: Leandrew Koyanagi, MD;  Location: [facility];  Service: Orthopedics;  Laterality: Right;    OB History   No obstetric history on file.      Home Medications    Prior to Admission medications   Medication Sig Start Date End Date Taking? Authorizing Provider  atenolol (TENORMIN) 50 MG tablet Take 1 tablet (50 mg total) by mouth daily. 08/30/21   Biagio Borg, MD  atorvastatin (LIPITOR) 10 MG tablet TAKE 1 TABLET(10 MG) BY MOUTH DAILY 08/30/21   Biagio Borg, MD  azelastine (ASTELIN) 0.1 % nasal spray Place 1 spray into both nostrils as needed (Summer Allergies). 08/30/21   Biagio Borg, MD  calcium-vitamin D (OSCAL WITH D) 500-200 MG-UNIT tablet Take 1 tablet by mouth daily with breakfast. 08/30/21   Biagio Borg, MD  enalapril (VASOTEC) 10 MG tablet Take 1 tablet (10 mg total) by mouth daily. 08/30/21   Biagio Borg, MD  gabapentin (NEURONTIN) 100 MG capsule Take 1 capsule (100 mg total) by mouth 3 (three) times daily. 08/30/21   Biagio Borg, MD  levothyroxine (SYNTHROID) 125 MCG tablet  Take 1 tablet (125 mcg total) by mouth daily. 08/30/21   Biagio Borg, MD  QUEtiapine (SEROQUEL) 25 MG tablet Take 1 tablet (25 mg total) by mouth at bedtime. 08/26/21   Biagio Borg, MD    Family History Family History  Problem Relation Age of Onset   Sudden death Mother        d.37s   Breast cancer Mother 44   Pulmonary embolism Father        Possible   Lung cancer Paternal Grandfather        history of smoking   Breast cancer Maternal Aunt        d.78s    Social History Social History   Tobacco Use   Smoking status: Former    Packs/day: 0.20    Years: 25.00    Pack years: 5.00    Types: Cigarettes    Quit date: 11/21/1971    Years since quitting: 49.9   Smokeless tobacco: Never  Vaping Use   Vaping Use: Never used  Substance Use Topics   Alcohol use: Yes    Comment: wine   Drug use: No     Allergies   Codeine and Oxycodone   Review of Systems Review of  Systems Per HPI  Physical Exam Triage Vital Signs ED Triage Vitals [11/05/21 1126]  Enc Vitals Group     BP (!) 189/76     Pulse Rate 78     Resp 16     Temp 98.8 F (37.1 C)     Temp Source Oral     SpO2 94 %     Weight      Height      Head Circumference      Peak Flow      Pain Score      Pain Loc      Pain Edu?      Excl. in [place]?    No data found.  Updated Vital Signs BP (!) 189/76 (BP Location: Left Arm)    Pulse 78    Temp 98.8 F (37.1 C) (Oral)    Resp 16    SpO2 94%   Visual Acuity Right Eye Distance:   Left Eye Distance:   Bilateral Distance:    Right Eye Near:   Left Eye Near:    Bilateral Near:     Physical Exam Constitutional:      General: She is not in acute distress.    Appearance: Normal appearance. She is not toxic-appearing or diaphoretic.  HENT:     Head: Normocephalic and atraumatic.     Right Ear: Tympanic membrane and ear canal normal.     Left Ear: Tympanic membrane and ear canal normal.     Nose: Congestion present.     Mouth/Throat:     Mouth: Mucous membranes are moist.     Pharynx: No posterior oropharyngeal erythema.  Eyes:     Extraocular Movements: Extraocular movements intact.     Conjunctiva/sclera: Conjunctivae normal.     Pupils: Pupils are equal, round, and reactive to light.  Cardiovascular:     Rate and Rhythm: Normal rate and regular rhythm.     Pulses: Normal pulses.     Heart sounds: Normal heart sounds.  Pulmonary:     Effort: Pulmonary effort is normal. No respiratory distress.     Breath sounds: Normal breath sounds. No stridor. No wheezing, rhonchi or rales.  Abdominal:     General: Abdomen is flat. Bowel  sounds are normal.     Palpations: Abdomen is soft.  Musculoskeletal:        General: Normal range of motion.     Cervical back: Normal range of motion.     Comments: No tenderness to palpation to left lower extremity.  No erythema or swelling noted.  Pedal pulses normal.  Capillary refill is greater than  3 seconds.  Skin:    General: Skin is warm and dry.  Neurological:     General: No focal deficit present.     Mental Status: She is alert and oriented to person, place, and time. Mental status is at baseline.  Psychiatric:        Mood and Affect: Mood normal.        Behavior: Behavior normal.     UC Treatments / Results  Labs (all labs ordered are listed, but only abnormal results are displayed) Labs Reviewed - No data to display  EKG   Radiology No results found.  Procedures Procedures (including critical care time)  Medications Ordered in UC Medications - No data to display  Initial Impression / Assessment and Plan / UC Course  I have reviewed the triage vital signs and the nursing notes.  Pertinent labs & imaging results that were available during my care of the patient were reviewed by me and considered in my medical decision making (see chart for details).     Left calf pain and physical exam is concerning for possible DVT.  Also increased urine volume and patient's vital signs are also concerning for worrisome etiologies.  Advised patient and caregiver that she should go to the hospital for further evaluation and management.  She will most likely need ultrasound imaging of the leg to rule out DVT.  Unable to order ultrasound in urgent care.  Patient was agreeable to go to the hospital.  Vital signs fairly stable at discharge.  Agree with patient's daughter transporting her to the hospital.  Will defer treatment and evaluation to emergency department for all of patient's chief complaints. Final Clinical Impressions(s) / UC Diagnoses   Final diagnoses:  Pain of left calf  Viral upper respiratory tract infection with cough  Increased urine volume     Discharge Instructions      Please go to the emergency department as soon as you leave urgent care for further evaluation and management.    ED Prescriptions   None    PDMP not reviewed this encounter.    Teodora Medici, [place] 11/05/21 [id number]

## 2021-11-05 NOTE — ED Triage Notes (Addendum)
"  I had 10,000 gallons of water pour out of me last night, out of all the holes. Not diarrhea, just water." Daughter reports she's aching, has a low grade fever, and is also coughing. Also complaining of left lower leg swelling starting around Wednesday this week, denies right leg swelling. Significant pitting edema evident in left lower leg. C/o left calf pain. Was hospitalized 2 weeks prior and has been receiving home health services since.

## 2021-11-05 NOTE — ED Provider Notes (Signed)
Emergency Medicine Provider Triage Evaluation Note  Danielle [name] , a 85 y.o. female  was evaluated in triage.  Pt here for mult complaints. Sent from urgent care. Seen pta for eval of urinary incontinence. Denies dysuria, nv. Also c/o lle edema and calf pain, there was concern for dvt. Denies chest pain, sob. C/o uri sxs at urgent care  Review of Systems  Positive: Urinary incontinence, lle edema, calf pain Negative: Chest pain, sob  Physical Exam  BP (!) 176/82    Pulse 79    Temp 98.7 F (37.1 C)    Resp 19    SpO2 98%  Gen:   Awake, no distress   Resp:  Normal effort  MSK:   Moves extremities without difficulty  Other:  Heart rrr, lungs ctab, ble edema (L>R), calf ttp on exam  Medical Decision Making  Medically screening exam initiated at 3:45 PM.  Appropriate orders placed.  GIAVANA ROOKE was informed that the remainder of the evaluation will be completed by another provider, this initial triage assessment does not replace that evaluation, and the importance of remaining in the ED until their evaluation is complete.     Rodney Booze, PA-C 11/05/21 1547    Carmin Muskrat, MD 11/05/21 [phone]

## 2021-11-05 NOTE — Progress Notes (Signed)
LLE venous duplex has been completed.  Preliminary results given to [employer], [place].  Results can be found under chart review under CV PROC. 11/05/2021 5:03 PM Hiliana Eilts RVT, RDMS

## 2021-11-05 NOTE — ED Triage Notes (Signed)
Pt reports for the past two days she has been urinating very frequently, to the point where she has lost control of her bladder at times. Pt reports some right sided abdominal pain. She also c/o pain and swelling to left leg.

## 2021-11-05 NOTE — Discharge Instructions (Signed)
Please go to the emergency department as soon as you leave urgent care for further evaluation and management. ?

## 2021-11-07 ENCOUNTER — Encounter: Payer: Self-pay | Admitting: Internal Medicine

## 2021-11-07 DIAGNOSIS — L609 Nail disorder, unspecified: Secondary | ICD-10-CM

## 2021-11-09 ENCOUNTER — Telehealth: Payer: Self-pay | Admitting: Internal Medicine

## 2021-11-09 NOTE — Telephone Encounter (Signed)
Pt daughter called back in regards to prior Mycharts message. Please call daughter.

## 2021-11-09 NOTE — Telephone Encounter (Signed)
Manus Gunning from [employer] calling in  Wants to know if provider thinks patient is an appropriate fit for hospice  Please call [phone]

## 2021-11-09 NOTE — Telephone Encounter (Signed)
Yes, ok for hospice referral given her rapid worsening dementia and heart disease

## 2021-11-10 MED ORDER — LEVOTHYROXINE SODIUM 125 MCG PO TABS: 125.0000 ug | ORAL_TABLET | Freq: Every day | ORAL | 3 refills | Status: DC

## 2021-11-10 MED ORDER — NIRMATRELVIR/RITONAVIR (PAXLOVID)TABLET: 3.0000 | ORAL_TABLET | Freq: Two times a day (BID) | ORAL | 0 refills | Status: AC

## 2021-11-10 NOTE — Telephone Encounter (Signed)
No, ok to hold on taking the gabapentin if not having leg pain at that time

## 2021-11-10 NOTE — Telephone Encounter (Signed)
Called and left a vm for [facility] with Dr. Gwynn Burly recommendations.

## 2021-11-10 NOTE — Telephone Encounter (Signed)
Patient's daughter notified.

## 2021-11-11 ENCOUNTER — Telehealth: Payer: Self-pay | Admitting: Internal Medicine

## 2021-11-11 NOTE — Telephone Encounter (Signed)
Patient's daughter Clyde Canterbury requesting a rx for  patient's psychotic breakdown  Advised caller to schedule an ov, caller declined  Caller is not listed on patient's dpr  No patient information was released

## 2021-11-11 NOTE — Telephone Encounter (Signed)
Danielle [name] making provider aware that patient is having a psychotic breakdown and becoming combative towards family members  Caller sated she informed family members to call 911

## 2021-11-22 ENCOUNTER — Other Ambulatory Visit (INDEPENDENT_AMBULATORY_CARE_PROVIDER_SITE_OTHER): Payer: Medicare HMO

## 2021-11-22 DIAGNOSIS — R7302 Impaired glucose tolerance (oral): Secondary | ICD-10-CM | POA: Diagnosis not present

## 2021-11-22 DIAGNOSIS — L603 Nail dystrophy: Secondary | ICD-10-CM | POA: Diagnosis not present

## 2021-11-22 DIAGNOSIS — E7800 Pure hypercholesterolemia, unspecified: Secondary | ICD-10-CM | POA: Diagnosis not present

## 2021-11-22 DIAGNOSIS — E039 Hypothyroidism, unspecified: Secondary | ICD-10-CM

## 2021-11-22 DIAGNOSIS — M79672 Pain in left foot: Secondary | ICD-10-CM | POA: Diagnosis not present

## 2021-11-22 DIAGNOSIS — B351 Tinea unguium: Secondary | ICD-10-CM | POA: Diagnosis not present

## 2021-11-22 DIAGNOSIS — L600 Ingrowing nail: Secondary | ICD-10-CM | POA: Diagnosis not present

## 2021-11-22 DIAGNOSIS — M79675 Pain in left toe(s): Secondary | ICD-10-CM | POA: Diagnosis not present

## 2021-11-22 DIAGNOSIS — I739 Peripheral vascular disease, unspecified: Secondary | ICD-10-CM | POA: Diagnosis not present

## 2021-11-22 DIAGNOSIS — M79671 Pain in right foot: Secondary | ICD-10-CM | POA: Diagnosis not present

## 2021-11-22 LAB — HEPATIC FUNCTION PANEL
ALT: 11 U/L (ref 0–35)
AST: 15 U/L (ref 0–37)
Albumin: 3.8 g/dL (ref 3.5–5.2)
Alkaline Phosphatase: 64 U/L (ref 39–117)
Bilirubin, Direct: 0.1 mg/dL (ref 0.0–0.3)
Total Bilirubin: 0.7 mg/dL (ref 0.2–1.2)
Total Protein: 6 g/dL (ref 6.0–8.3)

## 2021-11-22 LAB — LIPID PANEL
Cholesterol: 169 mg/dL (ref 0–200)
HDL: 73.3 mg/dL (ref >39.00)
LDL Cholesterol: 68 mg/dL (ref 0–99)
NonHDL: 95.79
Total CHOL/HDL Ratio: 2
Triglycerides: 139 mg/dL (ref 0.0–149.0)
VLDL: 27.8 mg/dL (ref 0.0–40.0)

## 2021-11-22 LAB — TSH: TSH: 0.06 u[IU]/mL — ABNORMAL LOW (ref 0.35–5.50)

## 2021-11-22 LAB — HEMOGLOBIN A1C: Hgb A1c MFr Bld: 5.9 % (ref 4.6–6.5)

## 2021-11-22 LAB — T4, FREE: Free T4: 1.36 ng/dL (ref 0.60–1.60)

## 2021-11-23 ENCOUNTER — Encounter: Payer: Self-pay | Admitting: Internal Medicine

## 2021-11-23 MED ORDER — QUETIAPINE FUMARATE 25 MG PO TABS: 25.0000 mg | ORAL_TABLET | Freq: Three times a day (TID) | ORAL | 1 refills | Status: DC

## 2021-11-23 NOTE — Telephone Encounter (Signed)
Done erx 

## 2021-11-23 NOTE — Telephone Encounter (Signed)
1.Medication Requested: QUEtiapine (SEROQUEL) 25 MG tablet  2. Pharmacy (Name, Street, [place]): [facility] [place] - [id number]  Phone:  [phone] Fax:  [phone]   3. On Med List: yes  4. Last Visit with PCP: 12.08.22  5. Next visit date with PCP: 04.07.23  **Patient daughter says patient has been taking increase dosage of meds due to increased agitation & wants to know if provider is willing to update & increase dosage permanently**   Agent: Please be advised that RX refills may take up to 3 business days. We ask that you follow-up with your pharmacy.

## 2021-11-25 ENCOUNTER — Encounter: Payer: Self-pay | Admitting: Internal Medicine

## 2021-11-25 ENCOUNTER — Other Ambulatory Visit: Payer: Self-pay | Admitting: Internal Medicine

## 2021-11-25 DIAGNOSIS — R109 Unspecified abdominal pain: Secondary | ICD-10-CM

## 2021-11-25 DIAGNOSIS — E039 Hypothyroidism, unspecified: Secondary | ICD-10-CM

## 2021-11-25 MED ORDER — LEVOTHYROXINE SODIUM 112 MCG PO TABS: 112.0000 ug | ORAL_TABLET | Freq: Every day | ORAL | 3 refills | Status: DC

## 2021-11-26 ENCOUNTER — Encounter: Payer: Self-pay | Admitting: Internal Medicine

## 2021-11-30 ENCOUNTER — Encounter: Payer: Self-pay | Admitting: Neurology

## 2021-11-30 ENCOUNTER — Ambulatory Visit: Payer: Medicare HMO | Admitting: Neurology

## 2021-11-30 VITALS — BP 149/75 | HR 81 | Ht 65.0 in | Wt 113.4 lb

## 2021-11-30 DIAGNOSIS — Z634 Disappearance and death of family member: Secondary | ICD-10-CM

## 2021-11-30 DIAGNOSIS — F430 Acute stress reaction: Secondary | ICD-10-CM

## 2021-11-30 DIAGNOSIS — R4189 Other symptoms and signs involving cognitive functions and awareness: Secondary | ICD-10-CM

## 2021-11-30 NOTE — Patient Instructions (Addendum)
Continue current medication  Follow up with Neuropsychology  Follow up with your PCP  Make an appointment with the [facility] for driving test Return in 1 year

## 2021-11-30 NOTE — Progress Notes (Signed)
[facility]  PATIENT: Danielle [name] DOB: [birth year]  REQUESTING CLINICIAN: Leonette Monarch Grayce Sessions,* HISTORY FROM: Patient  REASON FOR VISIT: Cognitive and behavioral problem   HISTORICAL  CHIEF COMPLAINT:  Chief Complaint  Patient presents with   New Patient (Initial Visit)    Rm 20. NP/internal ED referral for cognitive and behavioral changes Patient would like to know if quetiapine could be effecting cognition. MMSE 21/30    HISTORY OF PRESENT ILLNESS:  This is a 86 year old woman with past medical history of hypothyroidism, hypertension, hyperlipidemia who is presenting with memory decline and behavioral changes for the past year after death of husband.  Patient reported that her husband died on 21-Nov-2020, and he has been going through grief.  Per patient, there was a period of time where she could not remember anything.  She reported that her daughter has told her that she was acting strange but patient does not remember it, and there was also report that she did not believe that her husband died but later recognized that he died and she was comfortable with it.  She tells me that she believe that she had this reaction because she was frightened, she said that her husband was a protector and it was very scared to know that she is living alone in a [place] farm alone and does not have any protection. Her daughter was present during this interview and was able to give additional insight of her mother's condition.  She reported that after the death of their father her mom was going to grief, she had lost a lot of weight, and this past summer she could not take care of her old horses, she had to put 2 of them down and that was very traumatic for her because she loves her horses.  On December 2 they had to call the ambulance because she was not doing well, she could not walk and she was acting strange.  There was report that she believed that somebody was in  her closet.  She was noted to be a little paranoid thinking that her daughter was trying to kidnap her and she had tried to escape from her home.  She was taken to the hospital, she was started on Seroquel which was gradually increased up to 3 times daily. For the past month they report improvement of her symptoms, she still forgetful, has to ask the same question multiple times, for instance daughter reports that for today's appointment she had to tell her four time but she had a neurology appointment today.  Other than that she is independent, she currently she does not drive but she is able to cook, clean, bathe and take care of her own self.  Her daughter is living with her and her family his helping her with her bills because back in October she had not paid her bills and was having issue with her bank and a creditors.  She remains very active in a farm.   OTHER MEDICAL CONDITIONS: Hypothyroid, cognitive impairment, HTN, HLD   REVIEW OF SYSTEMS: Full 14 system review of systems performed and negative with exception of: as noted in the HPI  ALLERGIES: Allergies  Allergen Reactions   Codeine Nausea And Vomiting   Oxycodone Nausea And Vomiting    HOME MEDICATIONS: Outpatient Medications Prior to Visit  Medication Sig Dispense Refill   atenolol (TENORMIN) 50 MG tablet Take 1 tablet (50 mg total) by mouth daily. 90 tablet 3  atorvastatin (LIPITOR) 10 MG tablet TAKE 1 TABLET(10 MG) BY MOUTH DAILY 90 tablet 3   azelastine (ASTELIN) 0.1 % nasal spray Place 1 spray into both nostrils as needed (Summer Allergies). 30 mL 11   calcium-vitamin D (OSCAL WITH D) 500-200 MG-UNIT tablet Take 1 tablet by mouth daily with breakfast. 90 tablet 3   enalapril (VASOTEC) 10 MG tablet Take 1 tablet (10 mg total) by mouth daily. 90 tablet 3   gabapentin (NEURONTIN) 100 MG capsule Take 1 capsule (100 mg total) by mouth 3 (three) times daily. 90 capsule 3   levothyroxine (SYNTHROID) 112 MCG tablet Take 1 tablet  (112 mcg total) by mouth daily before breakfast. 90 tablet 3   QUEtiapine (SEROQUEL) 25 MG tablet Take 1 tablet (25 mg total) by mouth 3 (three) times daily. 270 tablet 1   No facility-administered medications prior to visit.    PAST MEDICAL HISTORY: Past Medical History:  Diagnosis Date   ALLERGIC RHINITIS 01/09/2008   ANEMIA-NOS 01/09/2008   pt doesn't remember   ANXIETY 01/09/2008   ASTHMATIC BRONCHITIS, ACUTE 10/20/2008   AV BLOCK, COMPLETE 12/16/2009   Breast cancer, right (HCC)    DEPRESSION 01/09/2008   Dizziness and giddiness 02/25/2010   DVT, HX OF 01/09/2008   after delivery of 4 th child   Genetic testing 06/04/2017   Ms. Ziska underwent genetic counseling and testing for hereditary cancer syndromes on 05/24/2017. Her results were negative for mutations in all 46 genes analyzed by [facility]'s 46-gene Common Hereditary Cancers Panel. Genes analyzed include: APC, ATM, AXIN2, BARD1, BMPR1A, BRCA1, BRCA2, BRIP1, CDH1, CDKN2A, CHEK2, CTNNA1, DICER1, EPCAM, GREM1, HOXB13, KIT, MEN1, MLH1, MSH2, MSH3, MSH6, MUTYH, NBN   GERD 01/09/2008   GLUCOSE INTOLERANCE 01/09/2008   History of kidney stones    HYPERLIPIDEMIA 01/09/2008   HYPERTENSION 01/09/2008   HYPOTHYROIDISM 01/09/2008   Impaired glucose tolerance 07/28/2011   INSOMNIA-SLEEP DISORDER-UNSPEC 06/15/2009   NEPHROLITHIASIS, HX OF 01/09/2008   OSTEOARTHRITIS, CERVICAL SPINE 02/25/2010   Presence of permanent cardiac pacemaker    medtronic, pacemaker dependent   Urine incontinence     PAST SURGICAL HISTORY: Past Surgical History:  Procedure Laterality Date   BREAST BIOPSY Right 09/20/2016   malignant   ERCP W/ SPHICTEROTOMY     pt denies   HEMORRHOID SURGERY     pt denies   IR URETERAL STENT RIGHT NEW ACCESS W/O SEP NEPHROSTOMY CATH  02/09/2020   MASTECTOMY Right 01/11/2017   MASTECTOMY W/ SENTINEL NODE BIOPSY Right 01/11/2017   Procedure: RIGHT MASTECTOMY WITH SENTINEL LYMPH NODE BIOPSY;  Surgeon: Almond Lint, MD;  Location: [facility] OR;   Service: General;  Laterality: Right;   NEPHROLITHOTOMY Right 02/09/2020   Procedure: NEPHROLITHOTOMY PERCUTANEOUS;  Surgeon: Ihor Gully, MD;  Location: [facility] ORS;  Service: Urology;  Laterality: Right;   PACEMAKER PLACEMENT Left    MedTronic EnPulse [id number] no heartbeat without pacemaker   PERMANENT PACEMAKER GENERATOR CHANGE N/A 07/16/2012   Procedure: PERMANENT PACEMAKER GENERATOR CHANGE;  Surgeon: Duke Salvia, MD;  Location: [facility] CATH LAB;  Service: Cardiovascular;  Laterality: N/A;   POLYPECTOMY     Uterine   TOTAL HIP ARTHROPLASTY Right 04/28/2019   Procedure: RIGHT TOTAL HIP ARTHROPLASTY ANTERIOR APPROACH;  Surgeon: Tarry Kos, MD;  Location: [facility] OR;  Service: Orthopedics;  Laterality: Right;    FAMILY HISTORY: Family History  Problem Relation Age of Onset   Sudden death Mother        d.21s   Breast cancer Mother 46  Pulmonary embolism Father        Possible   Lung cancer Paternal Grandfather        history of smoking   Breast cancer Maternal Aunt        d.78s    SOCIAL HISTORY: Social History   Socioeconomic History   Marital status: Married    Spouse name: Not on file   Number of children: Not on file   Years of education: Not on file   Highest education level: Not on file  Occupational History   Not on file  Tobacco Use   Smoking status: Former    Packs/day: 0.20    Years: 25.00    Pack years: 5.00    Types: Cigarettes    Quit date: 11/21/1971    Years since quitting: 50.0   Smokeless tobacco: Never  Vaping Use   Vaping Use: Never used  Substance and Sexual Activity   Alcohol use: Yes    Comment: wine   Drug use: No   Sexual activity: Not on file  Other Topics Concern   Not on file  Social History Narrative   Not on file   Social Determinants of Health   Financial Resource Strain: Not on file  Food Insecurity: Not on file  Transportation Needs: Not on file  Physical Activity: Not on file  Stress: Not on file  Social Connections: Not on file   Intimate Partner Violence: Not on file    PHYSICAL EXAM  GENERAL EXAM/CONSTITUTIONAL: Vitals:  Vitals:   11/30/21 1407  BP: (!) 149/75  Pulse: 81  Weight: 113 lb 6.4 oz (51.4 kg)  Height: $Remove'5\' 5"'avnJjCc$  (1.651 m)   Body mass index is 18.87 kg/m. Wt Readings from Last 3 Encounters:  11/30/21 113 lb 6.4 oz (51.4 kg)  10/27/21 103 lb (46.7 kg)  10/22/21 130 lb (59 kg)   Patient is in no distress; well developed, nourished and groomed; neck is supple  CARDIOVASCULAR: Examination of carotid arteries is normal; no carotid bruits Regular rate and rhythm, no murmurs Examination of peripheral vascular system by observation and palpation is normal  EYES: Pupils round and reactive to light, Visual fields full to confrontation, Extraocular movements intacts,   MUSCULOSKELETAL: Gait, strength, tone, movements noted in Neurologic exam below  NEUROLOGIC: MENTAL STATUS:  MMSE - [place] Exam 11/30/2021  Orientation to time 3  Orientation to Place 4  Registration 3  Attention/ Calculation 0  Recall 2  Language- name 2 objects 2  Language- repeat 1  Language- follow 3 step command 3  Language- read & follow direction 1  Write a sentence 1  Copy design 1  Total score 21    CRANIAL NERVE: 2nd, 3rd, 4th, 6th - pupils equal and reactive to light, visual fields full to confrontation, extraocular muscles intact, no nystagmus 5th - facial sensation symmetric 7th - facial strength symmetric 8th - hearing intact 9th - palate elevates symmetrically, uvula midline 11th - shoulder shrug symmetric 12th - tongue protrusion midline  MOTOR:  normal bulk and tone, full strength in the BUE, BLE  SENSORY:  normal and symmetric to light touch, pinprick, temperature, vibration  COORDINATION:  finger-nose-finger, fine finger movements normal  REFLEXES:  deep tendon reflexes present and symmetric  GAIT/STATION:  normal   DIAGNOSTIC DATA (LABS, IMAGING, TESTING) - I reviewed  patient records, labs, notes, testing and imaging myself where available.  Lab Results  Component Value Date   WBC 4.8 11/05/2021   HGB 11.3 (L)  11/05/2021   HCT 35.0 (L) 11/05/2021   MCV 103.6 (H) 11/05/2021   PLT 156 11/05/2021      Component Value Date/Time   NA 138 11/05/2021 1554   NA 140 07/25/2017 1347   K 4.0 11/05/2021 1554   K 4.3 07/25/2017 1347   CL 104 11/05/2021 1554   CO2 24 11/05/2021 1554   CO2 26 07/25/2017 1347   GLUCOSE 97 11/05/2021 1554   GLUCOSE 77 07/25/2017 1347   BUN 22 11/05/2021 1554   BUN 27.4 (H) 07/25/2017 1347   CREATININE 0.87 11/05/2021 1554   CREATININE 1.11 (H) 09/10/2020 1337   CREATININE 1.1 07/25/2017 1347   CALCIUM 9.5 11/05/2021 1554   CALCIUM 9.8 07/25/2017 1347   PROT 6.0 11/22/2021 1114   PROT 6.6 07/25/2017 1347   ALBUMIN 3.8 11/22/2021 1114   ALBUMIN 4.0 07/25/2017 1347   AST 15 11/22/2021 1114   AST 20 09/10/2020 1337   AST 29 07/25/2017 1347   ALT 11 11/22/2021 1114   ALT 9 09/10/2020 1337   ALT 28 07/25/2017 1347   ALKPHOS 64 11/22/2021 1114   ALKPHOS 75 07/25/2017 1347   BILITOT 0.7 11/22/2021 1114   BILITOT 1.2 09/10/2020 1337   BILITOT 1.21 (H) 07/25/2017 1347   GFRNONAA >60 11/05/2021 1554   GFRNONAA 50 (L) 09/10/2020 1337   GFRNONAA 57 (L) 07/15/2012 1714   GFRAA 58 (L) 02/03/2020 1448   GFRAA 66 07/15/2012 1714   Lab Results  Component Value Date   CHOL 169 11/22/2021   HDL 73.30 11/22/2021   LDLCALC 68 11/22/2021   LDLDIRECT 128.0 05/11/2016   TRIG 139.0 11/22/2021   CHOLHDL 2 11/22/2021   Lab Results  Component Value Date   HGBA1C 5.9 11/22/2021   Lab Results  Component Value Date   VITAMINB12 253 09/11/2019   Lab Results  Component Value Date   TSH 0.06 (L) 11/22/2021    Head CT 10/23/2021 Chronic atrophic and ischemic changes without acute abnormality.   ASSESSMENT AND PLAN  86 y.o. year old female with hypertension, hyperlipidemia and hypothyroidism who is presenting with memory  decline for the past year.  This has been in the setting of losing her husband and putting 2 of her beloved horses down.  There was report of memory decline, weight loss, and being paranoid.  She has been started on Seroquel 25 mg 3 times daily with improvement of her symptoms.  Today she scored a 21 out of 30 on the Mini-Mental status showing evidence of cognitive impairment.  I still do believe the patient needs a formal neuropsychological testing to determine at what point her depression is affecting her memory prior to starting her on any medication.  I will refer her to a formal neuropsychological testing, she is comfortable with plan.  Her B12 was low but she has started B12 supplement. Advised her to continue current medication, to remain active.  She did report that she would like to resume driving.  I have referred her back to the [facility] for a formal driving test.  Patient and daughter are comfortable with plan.  I will see him in 1 year for follow-up or sooner if worse.   1. Cognitive impairment   2. Acute stress reaction   3. Husband deceased      Patient Instructions  Continue current medication  Follow up with Neuropsychology  Follow up with your PCP  Make an appointment with the [facility] for driving test Return in 1 year   Orders  Placed This Encounter  Procedures   Ambulatory referral to Neuropsychology    No orders of the defined types were placed in this encounter.   Return in about 1 year (around 11/30/2022).    Alric Ran, MD 11/30/2021, 5:34 PM  [facility] [address] [place], [place] [address] [phone]

## 2021-12-05 ENCOUNTER — Encounter: Payer: Self-pay | Admitting: Internal Medicine

## 2021-12-05 MED ORDER — LORAZEPAM 0.5 MG PO TABS: 0.5000 mg | ORAL_TABLET | Freq: Two times a day (BID) | ORAL | 1 refills | Status: DC | PRN

## 2021-12-06 ENCOUNTER — Encounter: Payer: Self-pay | Admitting: Psychology

## 2021-12-08 ENCOUNTER — Ambulatory Visit
Admission: RE | Admit: 2021-12-08 | Discharge: 2021-12-08 | Disposition: A | Discharge status: Home / Self Care | Payer: Medicare HMO | Source: Ambulatory Visit | Attending: Internal Medicine | Admitting: Internal Medicine

## 2021-12-08 DIAGNOSIS — R1031 Right lower quadrant pain: Secondary | ICD-10-CM | POA: Diagnosis not present

## 2021-12-08 DIAGNOSIS — R109 Unspecified abdominal pain: Secondary | ICD-10-CM

## 2021-12-15 ENCOUNTER — Encounter (HOSPITAL_COMMUNITY): Payer: Self-pay

## 2021-12-15 ENCOUNTER — Observation Stay (HOSPITAL_COMMUNITY)
Admission: EM | Admit: 2021-12-15 | Discharge: 2021-12-17 | Disposition: A | Discharge status: Home / Self Care | Payer: Medicare HMO | Attending: Internal Medicine | Admitting: Internal Medicine

## 2021-12-15 DIAGNOSIS — Z79899 Other long term (current) drug therapy: Secondary | ICD-10-CM | POA: Insufficient documentation

## 2021-12-15 DIAGNOSIS — Z96641 Presence of right artificial hip joint: Secondary | ICD-10-CM | POA: Diagnosis not present

## 2021-12-15 DIAGNOSIS — Z853 Personal history of malignant neoplasm of breast: Secondary | ICD-10-CM | POA: Diagnosis not present

## 2021-12-15 DIAGNOSIS — T43591A Poisoning by other antipsychotics and neuroleptics, accidental (unintentional), initial encounter: Secondary | ICD-10-CM | POA: Insufficient documentation

## 2021-12-15 DIAGNOSIS — Z87891 Personal history of nicotine dependence: Secondary | ICD-10-CM | POA: Insufficient documentation

## 2021-12-15 DIAGNOSIS — F03918 Unspecified dementia, unspecified severity, with other behavioral disturbance: Secondary | ICD-10-CM | POA: Diagnosis present

## 2021-12-15 DIAGNOSIS — D649 Anemia, unspecified: Secondary | ICD-10-CM | POA: Diagnosis present

## 2021-12-15 DIAGNOSIS — T43592A Poisoning by other antipsychotics and neuroleptics, intentional self-harm, initial encounter: Secondary | ICD-10-CM | POA: Diagnosis not present

## 2021-12-15 DIAGNOSIS — Z950 Presence of cardiac pacemaker: Secondary | ICD-10-CM | POA: Diagnosis present

## 2021-12-15 DIAGNOSIS — E039 Hypothyroidism, unspecified: Secondary | ICD-10-CM | POA: Diagnosis not present

## 2021-12-15 DIAGNOSIS — E778 Other disorders of glycoprotein metabolism: Secondary | ICD-10-CM | POA: Diagnosis present

## 2021-12-15 DIAGNOSIS — R17 Unspecified jaundice: Secondary | ICD-10-CM | POA: Insufficient documentation

## 2021-12-15 DIAGNOSIS — T426X2A Poisoning by other antiepileptic and sedative-hypnotic drugs, intentional self-harm, initial encounter: Secondary | ICD-10-CM | POA: Diagnosis not present

## 2021-12-15 DIAGNOSIS — J45909 Unspecified asthma, uncomplicated: Secondary | ICD-10-CM | POA: Diagnosis not present

## 2021-12-15 DIAGNOSIS — U071 COVID-19: Secondary | ICD-10-CM | POA: Diagnosis not present

## 2021-12-15 DIAGNOSIS — R9431 Abnormal electrocardiogram [ECG] [EKG]: Secondary | ICD-10-CM | POA: Diagnosis not present

## 2021-12-15 DIAGNOSIS — I10 Essential (primary) hypertension: Secondary | ICD-10-CM | POA: Diagnosis present

## 2021-12-15 DIAGNOSIS — E8809 Other disorders of plasma-protein metabolism, not elsewhere classified: Secondary | ICD-10-CM | POA: Diagnosis not present

## 2021-12-15 DIAGNOSIS — T50901A Poisoning by unspecified drugs, medicaments and biological substances, accidental (unintentional), initial encounter: Secondary | ICD-10-CM | POA: Diagnosis present

## 2021-12-15 DIAGNOSIS — E785 Hyperlipidemia, unspecified: Secondary | ICD-10-CM | POA: Diagnosis present

## 2021-12-15 DIAGNOSIS — F411 Generalized anxiety disorder: Secondary | ICD-10-CM | POA: Diagnosis present

## 2021-12-15 DIAGNOSIS — T426X1A Poisoning by other antiepileptic and sedative-hypnotic drugs, accidental (unintentional), initial encounter: Secondary | ICD-10-CM | POA: Diagnosis not present

## 2021-12-15 LAB — COMPREHENSIVE METABOLIC PANEL
ALT: 16 U/L (ref 0–44)
AST: 21 U/L (ref 15–41)
Albumin: 4.2 g/dL (ref 3.5–5.0)
Alkaline Phosphatase: 67 U/L (ref 38–126)
Anion gap: 6 (ref 5–15)
BUN: 35 mg/dL — ABNORMAL HIGH (ref 8–23)
CO2: 26 mmol/L (ref 22–32)
Calcium: 9.3 mg/dL (ref 8.9–10.3)
Chloride: 106 mmol/L (ref 98–111)
Creatinine, Ser: 0.97 mg/dL (ref 0.44–1.00)
GFR, Estimated: 58 mL/min — ABNORMAL LOW (ref >60)
Glucose, Bld: 98 mg/dL (ref 70–99)
Potassium: 4.5 mmol/L (ref 3.5–5.1)
Sodium: 138 mmol/L (ref 135–145)
Total Bilirubin: 1.1 mg/dL (ref 0.3–1.2)
Total Protein: 6.8 g/dL (ref 6.5–8.1)

## 2021-12-15 LAB — CBC WITH DIFFERENTIAL/PLATELET
Abs Immature Granulocytes: 0.01 10*3/uL (ref 0.00–0.07)
Basophils Absolute: 0.1 10*3/uL (ref 0.0–0.1)
Basophils Relative: 1 %
Eosinophils Absolute: 0.3 10*3/uL (ref 0.0–0.5)
Eosinophils Relative: 5 %
HCT: 35.7 % — ABNORMAL LOW (ref 36.0–46.0)
Hemoglobin: 11.7 g/dL — ABNORMAL LOW (ref 12.0–15.0)
Immature Granulocytes: 0 %
Lymphocytes Relative: 38 %
Lymphs Abs: 2.3 10*3/uL (ref 0.7–4.0)
MCH: 32.1 pg (ref 26.0–34.0)
MCHC: 32.8 g/dL (ref 30.0–36.0)
MCV: 98.1 fL (ref 80.0–100.0)
Monocytes Absolute: 0.7 10*3/uL (ref 0.1–1.0)
Monocytes Relative: 11 %
Neutro Abs: 2.7 10*3/uL (ref 1.7–7.7)
Neutrophils Relative %: 45 %
Platelets: 172 10*3/uL (ref 150–400)
RBC: 3.64 MIL/uL — ABNORMAL LOW (ref 3.87–5.11)
RDW: 12.3 % (ref 11.5–15.5)
WBC: 6 10*3/uL (ref 4.0–10.5)
nRBC: 0 % (ref 0.0–0.2)

## 2021-12-15 LAB — MAGNESIUM: Magnesium: 2.2 mg/dL (ref 1.7–2.4)

## 2021-12-15 MED ORDER — SODIUM BICARBONATE 8.4 % IV SOLN
50.0000 meq | Freq: Once | INTRAVENOUS | Status: AC
  Administered 2021-12-15: 50 meq via INTRAVENOUS

## 2021-12-15 MED ORDER — SODIUM BICARBONATE 8.4 % IV SOLN: 100.0000 meq | Freq: Once | INTRAVENOUS | Status: DC

## 2021-12-15 MED ORDER — SODIUM BICARBONATE 8.4 % IV SOLN
50.0000 meq | Freq: Once | INTRAVENOUS | Status: DC
  Filled 2021-12-15: qty 50

## 2021-12-15 MED ORDER — SODIUM CHLORIDE 0.9 % IV BOLUS
500.0000 mL | Freq: Once | INTRAVENOUS | Status: AC
  Administered 2021-12-15: 500 mL via INTRAVENOUS

## 2021-12-15 NOTE — ED Notes (Signed)
The daughter is requesting a social work consult with hopes adult protective services can get involved

## 2021-12-15 NOTE — ED Notes (Addendum)
Poison control suggests bicarb based on QTC and QRS duration. Poison control also suggest K+ and magnesium as well.   Obtain magnesium level

## 2021-12-15 NOTE — ED Notes (Addendum)
Pts daughter reports the patient does not allow anyone to help her with her medication regime though the pt does not understand how to use the pill container. Daughter wants this documented that the patient does not know how to properly administer her own medications.

## 2021-12-15 NOTE — ED Provider Notes (Signed)
[facility] DEPT Provider Note   CSN: [id number] Arrival date & time: 12/15/21  1655     History  Chief Complaint  Patient presents with   Drug Overdose    Danielle [name] is a 86 y.o. female.  The history is provided by the patient and medical records.  Drug Overdose  86 y.o. F with hx of anemia, hx of DVT, HLP, kidney stones, insomnia, GERD, presenting to the ED for accidental medication overdose.  Per daughter, makes her pillbox every week.  Apparently today, she took an entire weeks worth of her medications including Seroquel 25mg  (8 tabs) and gabapentin 100mg  (17 tabs) plus another daily dose.  Patient denies doing this knowingly but states "I might have, who knows".  Apparently she has a home health RN that comes by to check on her and patient reports she did not notice anything odd about her medications.  Patient adamantly denies intentionally overdosing on medication, denies SI or intended self harm.  She denies any systemic symptoms such as drowsiness, dizziness, nausea, vomiting, abdominal pain.  Home Medications Prior to Admission medications   Medication Sig Start Date End Date Taking? Authorizing Provider  atenolol (TENORMIN) 50 MG tablet Take 1 tablet (50 mg total) by mouth daily. 08/30/21   Biagio Borg, MD  atorvastatin (LIPITOR) 10 MG tablet TAKE 1 TABLET(10 MG) BY MOUTH DAILY 08/30/21   Biagio Borg, MD  azelastine (ASTELIN) 0.1 % nasal spray Place 1 spray into both nostrils as needed (Summer Allergies). 08/30/21   Biagio Borg, MD  calcium-vitamin D (OSCAL WITH D) 500-200 MG-UNIT tablet Take 1 tablet by mouth daily with breakfast. 08/30/21   Biagio Borg, MD  enalapril (VASOTEC) 10 MG tablet Take 1 tablet (10 mg total) by mouth daily. 08/30/21   Biagio Borg, MD  gabapentin (NEURONTIN) 100 MG capsule Take 1 capsule (100 mg total) by mouth 3 (three) times daily. 08/30/21   Biagio Borg, MD  levothyroxine (SYNTHROID) 112 MCG  tablet Take 1 tablet (112 mcg total) by mouth daily before breakfast. 11/25/21   Biagio Borg, MD  LORazepam (ATIVAN) 0.5 MG tablet Take 1 tablet (0.5 mg total) by mouth 2 (two) times daily as needed for anxiety. 12/05/21   Biagio Borg, MD  QUEtiapine (SEROQUEL) 25 MG tablet Take 1 tablet (25 mg total) by mouth 3 (three) times daily. 11/23/21   Biagio Borg, MD      Allergies    Codeine and Oxycodone    Review of Systems   Review of Systems  Psychiatric/Behavioral:         Drug OD  All other systems reviewed and are negative.  Physical Exam Updated Vital Signs BP (!) 169/96    Pulse 87    Temp (!) 97.4 F (36.3 C) (Oral)    Resp 16    Ht 5\' 5"  (1.651 m)    Wt 51.4 kg    SpO2 99%    BMI 18.87 kg/m   Physical Exam Vitals and nursing note reviewed.  Constitutional:      Appearance: She is well-developed.  HENT:     Head: Normocephalic and atraumatic.  Eyes:     Conjunctiva/sclera: Conjunctivae normal.     Pupils: Pupils are equal, round, and reactive to light.  Cardiovascular:     Rate and Rhythm: Normal rate and regular rhythm.     Heart sounds: Normal heart sounds.  Pulmonary:     Effort: Pulmonary effort  is normal. No respiratory distress.     Breath sounds: Normal breath sounds. No rhonchi.  Abdominal:     General: Bowel sounds are normal.     Palpations: Abdomen is soft.     Tenderness: There is no rebound.  Musculoskeletal:        General: Normal range of motion.     Cervical back: Normal range of motion.  Skin:    General: Skin is warm and dry.  Neurological:     Mental Status: She is alert and oriented to person, place, and time.  Psychiatric:     Comments: Adamantly denies SI or intended self harm    ED Results / Procedures / Treatments   Labs (all labs ordered are listed, but only abnormal results are displayed) Labs Reviewed  COMPREHENSIVE METABOLIC PANEL - Abnormal; Notable for the following components:      Result Value   BUN 35 (*)    GFR, Estimated  58 (*)    All other components within normal limits  CBC WITH DIFFERENTIAL/PLATELET - Abnormal; Notable for the following components:   RBC 3.64 (*)    Hemoglobin 11.7 (*)    HCT 35.7 (*)    All other components within normal limits  RESP PANEL BY RT-PCR (FLU A&B, COVID) ARPGX2  MAGNESIUM  CBC WITH DIFFERENTIAL/PLATELET    EKG EKG Interpretation  Date/Time:  Thursday December 15 2021 18:19:57 EST Ventricular Rate:  72 PR Interval:  141 QRS Duration: 184 QT Interval:  500 QTC Calculation: 548 R Axis:   -85 Text Interpretation: AV PACED RHYTHM IVCD, consider atypical RBBB Left ventricular hypertrophy Confirmed by Veryl Speak [phone]) on 12/16/2021 12:53:35 AM  Radiology No results found.  Procedures Procedures    Medications Ordered in ED Medications  sodium bicarbonate injection 50 mEq (has no administration in time range)  sodium chloride 0.9 % bolus 500 mL (has no administration in time range)    ED Course/ Medical Decision Making/ A&P                           Medical Decision Making Amount and/or Complexity of Data Reviewed Labs: ordered. ECG/medicine tests: ordered and independent interpretation performed.  Risk Prescription drug management. Decision regarding hospitalization.  CRITICAL CARE Performed by: Larene Pickett   Total critical care time: 35 minutes  Critical care time was exclusive of separately billable procedures and treating other patients.  Critical care was necessary to treat or prevent imminent or life-threatening deterioration.  Critical care was time spent personally by me on the following activities: development of treatment plan with patient and/or surrogate as well as nursing, discussions with consultants, evaluation of patient's response to treatment, examination of patient, obtaining history from patient or surrogate, ordering and performing treatments and interventions, ordering and review of laboratory studies, ordering and review  of radiographic studies, pulse oximetry and re-evaluation of patient's condition.   86 year old female presenting to the ED after accidental overdose on her home medications.  Apparently, took a week's worth +1-day of her home Seroquel and gabapentin.  Poison control has been contacted--recommended observation for 6 to 8 hours, monitoring of QT.  This was noted to be prolonged on her initial EKG so recommended bicarb infusion.  Patient is currently awake, alert, pleasant.  She denies any systemic symptoms from this overdose such as drowsiness, nausea, or abdominal pain.  Her labs are overall reassuring.  Will monitor.  1:03 AM Repeat QTC still prolonged at  537.  Re-discussed with poison control-- needs additional medications (now recommending IV Mg+ and possibly Ca+) and continued observation until QTc <500.  Patient still remains AAOx3.  At this point as she needs continued observation, will require admission.  Discussed with Dr. Myna Hidalgo-- will admit for ongoing care.  Final Clinical Impression(s) / ED Diagnoses Final diagnoses:  Accidental drug overdose, initial encounter  Prolonged Q-T interval on ECG    Rx / DC Orders ED Discharge Orders     None         Larene Pickett, PA-C 12/16/21 0151    Teressa Lower, MD 12/17/21 [phone]

## 2021-12-15 NOTE — ED Triage Notes (Signed)
Patient's daughter puts patient's meds in a daily dose pill box and refilled yesterday. Patient's daughter states that the patient took a weeks supply plus a daily pill. Patient's daughter states Quetiapine- 8 tabs and Gabapentin-17 pills.

## 2021-12-15 NOTE — ED Provider Triage Note (Signed)
Emergency Medicine Provider Triage Evaluation Note  Danielle [name] , a 86 y.o. female  was evaluated in triage.  Pt complains of unintentional Drug Overdose.  Patient reports she made a mistake when taking her medication from the pillbox.  Review of Systems  Positive: NA Negative: N/A  Physical Exam  BP (!) 157/88 (BP Location: Left Arm)    Pulse 70    Temp 97.6 F (36.4 C) (Oral)    Resp 16    Ht 5\' 5"  (1.651 m)    Wt 51.4 kg    SpO2 100%    BMI 18.87 kg/m  Gen:   Awake, no distress   Resp:  Normal effort  MSK:   Moves extremities without difficulty  Other:    Medical Decision Making  Medically screening exam initiated at 5:51 PM.  Appropriate orders placed.  SYNDI PUA was informed that the remainder of the evaluation will be completed by another provider, this initial triage assessment does not replace that evaluation, and the importance of remaining in the ED until their evaluation is complete.  5:51 PM Call placed to [place] control, spoke to [employer].  Gabapentin 100 mg & Quetiapine 25 mg   Would expect significant sleepiness along  Also will see prolonged QRS  Concern for seizures and Ventricular dysrhythmia  Recommended: OBS: 6-8 HOURS EKG  Continuous monitoring  If prolong QRS --> sodium bicarbonate bolus High normal electrolytes           Janeece Fitting, PA-C 12/15/21 1807

## 2021-12-16 ENCOUNTER — Other Ambulatory Visit: Payer: Self-pay

## 2021-12-16 ENCOUNTER — Encounter (HOSPITAL_COMMUNITY): Payer: Self-pay | Admitting: Internal Medicine

## 2021-12-16 DIAGNOSIS — I10 Essential (primary) hypertension: Secondary | ICD-10-CM

## 2021-12-16 DIAGNOSIS — T50901A Poisoning by unspecified drugs, medicaments and biological substances, accidental (unintentional), initial encounter: Secondary | ICD-10-CM | POA: Diagnosis not present

## 2021-12-16 DIAGNOSIS — E778 Other disorders of glycoprotein metabolism: Secondary | ICD-10-CM | POA: Diagnosis present

## 2021-12-16 DIAGNOSIS — E039 Hypothyroidism, unspecified: Secondary | ICD-10-CM | POA: Diagnosis not present

## 2021-12-16 DIAGNOSIS — F03918 Unspecified dementia, unspecified severity, with other behavioral disturbance: Secondary | ICD-10-CM

## 2021-12-16 HISTORY — DX: Other disorders of bilirubin metabolism: E80.6

## 2021-12-16 HISTORY — DX: Other disorders of glycoprotein metabolism: E77.8

## 2021-12-16 LAB — CBC
HCT: 33.5 % — ABNORMAL LOW (ref 36.0–46.0)
Hemoglobin: 10.8 g/dL — ABNORMAL LOW (ref 12.0–15.0)
MCH: 31 pg (ref 26.0–34.0)
MCHC: 32.2 g/dL (ref 30.0–36.0)
MCV: 96.3 fL (ref 80.0–100.0)
Platelets: 157 10*3/uL (ref 150–400)
RBC: 3.48 MIL/uL — ABNORMAL LOW (ref 3.87–5.11)
RDW: 12.2 % (ref 11.5–15.5)
WBC: 5 10*3/uL (ref 4.0–10.5)
nRBC: 0 % (ref 0.0–0.2)

## 2021-12-16 LAB — COMPREHENSIVE METABOLIC PANEL
ALT: 15 U/L (ref 0–44)
AST: 19 U/L (ref 15–41)
Albumin: 3.5 g/dL (ref 3.5–5.0)
Alkaline Phosphatase: 56 U/L (ref 38–126)
Anion gap: 7 (ref 5–15)
BUN: 27 mg/dL — ABNORMAL HIGH (ref 8–23)
CO2: 24 mmol/L (ref 22–32)
Calcium: 8.5 mg/dL — ABNORMAL LOW (ref 8.9–10.3)
Chloride: 107 mmol/L (ref 98–111)
Creatinine, Ser: 0.78 mg/dL (ref 0.44–1.00)
GFR, Estimated: >60 mL/min (ref >60)
Glucose, Bld: 81 mg/dL (ref 70–99)
Potassium: 4.2 mmol/L (ref 3.5–5.1)
Sodium: 138 mmol/L (ref 135–145)
Total Bilirubin: 1.3 mg/dL — ABNORMAL HIGH (ref 0.3–1.2)
Total Protein: 5.8 g/dL — ABNORMAL LOW (ref 6.5–8.1)

## 2021-12-16 LAB — TROPONIN I (HIGH SENSITIVITY): Troponin I (High Sensitivity): 36 ng/L — ABNORMAL HIGH (ref <18)

## 2021-12-16 LAB — RESP PANEL BY RT-PCR (FLU A&B, COVID) ARPGX2
Influenza A by PCR: NEGATIVE
Influenza B by PCR: NEGATIVE
SARS Coronavirus 2 by RT PCR: POSITIVE — AB

## 2021-12-16 MED ORDER — OYSTER SHELL CALCIUM/D3 500-5 MG-MCG PO TABS
1.0000 | ORAL_TABLET | Freq: Every day | ORAL | Status: DC
  Administered 2021-12-17: 1 via ORAL
  Filled 2021-12-16 (×2): qty 1

## 2021-12-16 MED ORDER — SODIUM CHLORIDE 0.9 % IV SOLN: INTRAVENOUS | Status: DC

## 2021-12-16 MED ORDER — ATENOLOL 50 MG PO TABS
50.0000 mg | ORAL_TABLET | Freq: Every day | ORAL | Status: DC
  Administered 2021-12-16 – 2021-12-17 (×2): 50 mg via ORAL
  Filled 2021-12-16 (×2): qty 1

## 2021-12-16 MED ORDER — ACETAMINOPHEN 325 MG PO TABS: 650.0000 mg | ORAL_TABLET | Freq: Four times a day (QID) | ORAL | Status: DC | PRN

## 2021-12-16 MED ORDER — LORAZEPAM 2 MG/ML IJ SOLN
0.5000 mg | Freq: Once | INTRAMUSCULAR | Status: AC
Start: 2021-12-16 — End: 2021-12-16
  Administered 2021-12-16: 0.5 mg via INTRAVENOUS
  Filled 2021-12-16: qty 1

## 2021-12-16 MED ORDER — GABAPENTIN 100 MG PO CAPS: 200.0000 mg | ORAL_CAPSULE | Freq: Every day | ORAL | Status: DC

## 2021-12-16 MED ORDER — ACETAMINOPHEN 650 MG RE SUPP: 650.0000 mg | Freq: Four times a day (QID) | RECTAL | Status: DC | PRN

## 2021-12-16 MED ORDER — NIRMATRELVIR/RITONAVIR (PAXLOVID)TABLET
3.0000 | ORAL_TABLET | Freq: Two times a day (BID) | ORAL | Status: DC
Start: 2021-12-16 — End: 2021-12-16

## 2021-12-16 MED ORDER — ENALAPRIL MALEATE 10 MG PO TABS
10.0000 mg | ORAL_TABLET | Freq: Every day | ORAL | Status: DC
  Administered 2021-12-16 – 2021-12-17 (×2): 10 mg via ORAL
  Filled 2021-12-16 (×2): qty 1

## 2021-12-16 MED ORDER — LORAZEPAM 0.5 MG PO TABS: 0.5000 mg | ORAL_TABLET | Freq: Two times a day (BID) | ORAL | Status: DC | PRN

## 2021-12-16 MED ORDER — ATORVASTATIN CALCIUM 20 MG PO TABS
20.0000 mg | ORAL_TABLET | Freq: Every day | ORAL | Status: DC
  Administered 2021-12-16 – 2021-12-17 (×2): 20 mg via ORAL
  Filled 2021-12-16: qty 1
  Filled 2021-12-16: qty 2

## 2021-12-16 MED ORDER — MAGNESIUM SULFATE 2 GM/50ML IV SOLN
2.0000 g | Freq: Once | INTRAVENOUS | Status: AC
Start: 2021-12-16 — End: 2021-12-16
  Administered 2021-12-16: 2 g via INTRAVENOUS
  Filled 2021-12-16: qty 50

## 2021-12-16 MED ORDER — ATENOLOL 50 MG PO TABS: 50.0000 mg | ORAL_TABLET | Freq: Every day | ORAL | Status: DC

## 2021-12-16 MED ORDER — GABAPENTIN 100 MG PO CAPS
100.0000 mg | ORAL_CAPSULE | Freq: Every day | ORAL | Status: DC
  Administered 2021-12-17: 100 mg via ORAL
  Filled 2021-12-16: qty 1

## 2021-12-16 MED ORDER — QUETIAPINE FUMARATE 50 MG PO TABS
25.0000 mg | ORAL_TABLET | Freq: Two times a day (BID) | ORAL | Status: DC
  Administered 2021-12-16: 25 mg via ORAL
  Filled 2021-12-16: qty 1

## 2021-12-16 MED ORDER — LEVOTHYROXINE SODIUM 112 MCG PO TABS
112.0000 ug | ORAL_TABLET | Freq: Every day | ORAL | Status: DC
  Administered 2021-12-17: 112 ug via ORAL
  Filled 2021-12-16: qty 1

## 2021-12-16 NOTE — Assessment & Plan Note (Addendum)
Normal anemia panel about 2 years ago. Monitor hematocrit and hemoglobin.

## 2021-12-16 NOTE — Assessment & Plan Note (Signed)
Redirect as needed. Resume Seroquel tonight. Lorazepam as needed for anxiety.

## 2021-12-16 NOTE — ED Notes (Signed)
Per poison control, Repeat EKG to check QTC. Replete electrolytes up to high normal

## 2021-12-16 NOTE — ED Notes (Signed)
Per infection prevention, no need for isolation at this time.

## 2021-12-16 NOTE — Assessment & Plan Note (Signed)
Protein supplementation. Consider nutritional services evaluation.

## 2021-12-16 NOTE — ED Notes (Signed)
Pt removed her IV and all cardiac leads.

## 2021-12-16 NOTE — Assessment & Plan Note (Signed)
Continue atenolol 50 mg p.o. daily. Continue Vasotec 10 mg p.o. daily. Follow-up BP, HR, renal function electrolytes.

## 2021-12-16 NOTE — H&P (Signed)
History and Physical    Danielle [name] ZOX:[id number] DOB: [birth year] DOA: 12/15/2021  PCP: Biagio Borg, MD   Patient coming from: Home.  I have personally briefly reviewed patient's old medical records in [place]  Chief Complaint: Prescription medications accidental overdose.  HPI: Danielle [name] is a 86 y.o. female with medical history significant of allergy rhinitis, anemia, anxiety, depression, history of AV block and pacemaker placement, asthmatic bronchitis, right breast cancer, dizziness, history of DVT, GERD, glucose intolerance, nephrolithiasis, hyperlipidemia, hypertension, hypothyroidism, insomnia, osteoarthritis, urinary incontinence who was brought to the emergency department due to taking all her weeklong pillbox medications instead of a single day.  Her daughter stated that she thinks she took about 200 mg of quetiapine (normal dose 25 mg) and 17 gabapentin 100 mg tablets.  The patient was oriented x3 with with some difficulty.  She stated that she was unable to remember the date until she heard it on the TV news.  She believes her daughter is not handling her medications right.  After speaking to her daughter, the patient has been very paranoid and adamant about letting somebody else handle her medications.  Her daughter Teressa Senter has tried to stay with her to help her at home but the patient has kicked her out of the house at least 4 times.  She sometimes does not remember what she just bought at the store or why she did buy it.  She is able to answer simple questions but does not elaborate.  She denied headache, chest pain, back or abdominal pain at that time.  No nausea, dyspnea, palpitations, diaphoresis, nausea, diarrhea, melena or hematochezia.  She occasionally gets constipated.  She does not have any urinary symptoms.  97.6 F, pulse 70, respirations 16, BP 157/88 mmHg O2 sat 100% on room air.  ED Course: Initial vital signs were temperature 97.6 F, pulse 70,  respirations 16, BP 157/88 mmHg O2 sat 100% on room air.  The patient received sodium bicarb 50 mEq IVP, magnesium sulfate 2 g IVPB and 500 mL of NS bolus.  Lab work: CBC is her white count 6.0, hemoglobin 11.7 g/dL platelets 172.  CMP showed a BUN of 35 mg/dL, but was otherwise unremarkable.  Coronavirus PCR was positive but the patient had COVID-19 in December.  Review of Systems: As per HPI otherwise all other systems reviewed and are negative.  Past Medical History:  Diagnosis Date   ALLERGIC RHINITIS 01/09/2008   ANEMIA-NOS 01/09/2008   pt doesn't remember   ANXIETY 01/09/2008   ASTHMATIC BRONCHITIS, ACUTE 10/20/2008   AV BLOCK, COMPLETE 12/16/2009   Breast cancer, right ([place])    DEPRESSION 01/09/2008   Dizziness and giddiness 02/25/2010   DVT, HX OF 01/09/2008   after delivery of 4 th child   Genetic testing 06/04/2017   Ms. Reindel underwent genetic counseling and testing for hereditary cancer syndromes on 05/24/2017. Her results were negative for mutations in all 46 genes analyzed by [facility]'s 46-gene Common Hereditary Cancers Panel. Genes analyzed include: APC, ATM, AXIN2, BARD1, BMPR1A, BRCA1, BRCA2, BRIP1, CDH1, CDKN2A, CHEK2, CTNNA1, DICER1, EPCAM, GREM1, HOXB13, KIT, MEN1, MLH1, MSH2, MSH3, MSH6, MUTYH, NBN   GERD 01/09/2008   GLUCOSE INTOLERANCE 01/09/2008   History of kidney stones    HYPERLIPIDEMIA 01/09/2008   HYPERTENSION 01/09/2008   HYPOTHYROIDISM 01/09/2008   Impaired glucose tolerance 07/28/2011   INSOMNIA-SLEEP DISORDER-UNSPEC 06/15/2009   NEPHROLITHIASIS, HX OF 01/09/2008   OSTEOARTHRITIS, CERVICAL SPINE 02/25/2010   Presence of permanent cardiac  pacemaker    medtronic, pacemaker dependent   Urine incontinence     Past Surgical History:  Procedure Laterality Date   BREAST BIOPSY Right 09/20/2016   malignant   ERCP W/ SPHICTEROTOMY     pt denies   HEMORRHOID SURGERY     pt denies   IR URETERAL STENT RIGHT NEW ACCESS W/O SEP NEPHROSTOMY CATH  02/09/2020   MASTECTOMY  Right 01/11/2017   MASTECTOMY W/ SENTINEL NODE BIOPSY Right 01/11/2017   Procedure: RIGHT MASTECTOMY WITH SENTINEL LYMPH NODE BIOPSY;  Surgeon: [name] Klein, MD;  Location: [facility];  Service: General;  Laterality: Right;   NEPHROLITHOTOMY Right 02/09/2020   Procedure: NEPHROLITHOTOMY PERCUTANEOUS;  Surgeon: Kathie Rhodes, MD;  Location: [facility];  Service: Urology;  Laterality: Right;   PACEMAKER PLACEMENT Left    MedTronic EnPulse [id number] no heartbeat without pacemaker   PERMANENT PACEMAKER GENERATOR CHANGE N/A 07/16/2012   Procedure: PERMANENT PACEMAKER GENERATOR CHANGE;  Surgeon: Deboraha Sprang, MD;  Location: [facility] CATH LAB;  Service: Cardiovascular;  Laterality: N/A;   POLYPECTOMY     Uterine   TOTAL HIP ARTHROPLASTY Right 04/28/2019   Procedure: RIGHT TOTAL HIP ARTHROPLASTY ANTERIOR APPROACH;  Surgeon: Leandrew Koyanagi, MD;  Location: [facility];  Service: Orthopedics;  Laterality: Right;   Social History  reports that she quit smoking about 50 years ago. Her smoking use included cigarettes. She has a 5.00 pack-year smoking history. She has never used smokeless tobacco. She reports current alcohol use. She reports that she does not use drugs.  Allergies  Allergen Reactions   Codeine Nausea And Vomiting   Oxycodone Nausea And Vomiting   Family History  Problem Relation Age of Onset   Sudden death Mother        d.49s   Breast cancer Mother 64   Pulmonary embolism Father        Possible   Lung cancer Paternal Grandfather        history of smoking   Breast cancer Maternal Aunt        d.78s   Prior to Admission medications   Medication Sig Start Date End Date Taking? Authorizing Provider  atenolol (TENORMIN) 50 MG tablet Take 1 tablet (50 mg total) by mouth daily. 08/30/21  Yes Biagio Borg, MD  atorvastatin (LIPITOR) 10 MG tablet TAKE 1 TABLET(10 MG) BY MOUTH DAILY 08/30/21  Yes Biagio Borg, MD  azelastine (ASTELIN) 0.1 % nasal spray Place 1 spray into both nostrils as needed (Summer  Allergies). 08/30/21  Yes Biagio Borg, MD  calcium-vitamin D (OSCAL WITH D) 500-200 MG-UNIT tablet Take 1 tablet by mouth daily with breakfast. 08/30/21  Yes Biagio Borg, MD  enalapril (VASOTEC) 10 MG tablet Take 1 tablet (10 mg total) by mouth daily. 08/30/21  Yes Biagio Borg, MD  gabapentin (NEURONTIN) 100 MG capsule Take 1 capsule (100 mg total) by mouth 3 (three) times daily. 08/30/21  Yes Biagio Borg, MD  levothyroxine (SYNTHROID) 112 MCG tablet Take 1 tablet (112 mcg total) by mouth daily before breakfast. 11/25/21  Yes Biagio Borg, MD  LORazepam (ATIVAN) 0.5 MG tablet Take 1 tablet (0.5 mg total) by mouth 2 (two) times daily as needed for anxiety. 12/05/21  Yes Biagio Borg, MD  QUEtiapine (SEROQUEL) 25 MG tablet Take 1 tablet (25 mg total) by mouth 3 (three) times daily. 11/23/21  Yes Biagio Borg, MD    Physical Exam: Vitals:   12/16/21 0800 12/16/21 0900 12/16/21 0930 12/16/21  1000  BP: 138/67 (!) 147/71 138/70 (!) 175/79  Pulse: 60 (!) 59 72 65  Resp:  13  16  Temp:      TempSrc:      SpO2: 96% 96% 93% 98%  Weight:      Height:       Constitutional: Frail, elderly female.  NAD, calm, comfortable Eyes: PERRL, lids and conjunctivae normal ENMT: Mucous membranes are moist. Posterior pharynx clear of any exudate or lesions. Neck: normal, supple, no masses, no thyromegaly Respiratory: clear to auscultation bilaterally, no wheezing, no crackles. Normal respiratory effort. No accessory muscle use.  Cardiovascular: Regular rate and rhythm, no murmurs / rubs / gallops. No extremity edema. 2+ pedal pulses. No carotid bruits.  Abdomen: No distention.  Soft, no tenderness, no masses palpated. No hepatosplenomegaly. Bowel sounds positive.  Musculoskeletal: no clubbing / cyanosis.  Mild generalized weakness.  Good ROM, no contractures. Normal muscle tone.  Skin: no rashes, lesions, ulcers on very limited dermatological semination. Neurologic: CN 2-12 grossly intact. Sensation  intact, DTR normal. Strength 5/5 in all 4.  Psychiatric: Mildly anxious and paranoid.  Oriented to self, oriented to place with some difficulty, oriented to time and date after hearing it on the TV.  Disoriented to situation.  Labs on Admission: I have personally reviewed following labs and imaging studies  CBC: Recent Labs  Lab 12/15/21 2039 12/16/21 0818  WBC 6.0 5.0  NEUTROABS 2.7  --   HGB 11.7* 10.8*  HCT 35.7* 33.5*  MCV 98.1 96.3  PLT 172 542    Basic Metabolic Panel: Recent Labs  Lab 12/15/21 1806 12/15/21 2145 12/16/21 0818  NA 138  --  138  K 4.5  --  4.2  CL 106  --  107  CO2 26  --  24  GLUCOSE 98  --  81  BUN 35*  --  27*  CREATININE 0.97  --  0.78  CALCIUM 9.3  --  8.5*  MG  --  2.2  --     GFR: Estimated Creatinine Clearance: 42.5 mL/min (by C-G formula based on SCr of 0.78 mg/dL).  Liver Function Tests: Recent Labs  Lab 12/15/21 1806 12/16/21 0818  AST 21 19  ALT 16 15  ALKPHOS 67 56  BILITOT 1.1 1.3*  PROT 6.8 5.8*  ALBUMIN 4.2 3.5   Radiological Exams on Admission: No results found.  EKG: Independently reviewed.  Vent. rate 72 BPM PR interval 141 ms QRS duration 184 ms QT/QTcB 500/548 ms P-R-T axes 140 -85 91 AV PACED RHYTHM IVCD, consider atypical RBBB Left ventricular hypertrophy  Assessment/Plan Principal Problem:   Accidental overdose Gentle IV fluids. Hold Seroquel until tonight. Hold gabapentin until tomorrow. Keep electrolytes optimized. Continue close cardiac monitoring. Consult behavioral health. Consult TOC team to begin discharge planning.  Active Problems:   Dementia with behavioral disturbance Holding Seroquel. Anxiolytics as needed. Resume quetiapine in a.m. if no QT prolongation.    Anxiety state Continue lorazepam as needed. Supportive care.    Hypothyroidism Continue levothyroxine 112 mcg p.o. daily.    Hyperlipidemia Continue atorvastatin 20 mg p.o. daily. Monitor LFTs. Fasting lipid  follow-up as an outpatient.    Normocytic anemia Monitor H&H.    Essential hypertension Continue atenolol 50 mg p.o. daily. Continue enalapril 10 mg p.o. daily. Follow-up BP, HR, renal function electrolytes.    Cardiac pacemaker in situ Please keep in mind when interpreting EKGs.    Hypoproteinemia (HCC) Protein supplementation. Consider nutritional services evaluation.  Hyperbilirubinemia Follow-up LFTs in AM.     DVT prophylaxis: SCDs. Code Status:   Full code. Family Communication:  I spoke to both of her daughters.  Please see progress note documenting conversations. Disposition Plan:   Patient is from:  Home.  Anticipated DC to:  Home.  Anticipated DC date:  12/17/2021 or 12/18/2021.  Anticipated DC barriers: Clinical status/pending consult.  Consults called:  Routine behavioral health consult. Admission status:  Observation/telemetry.  Severity of Illness: High severity in the setting of a quetiapine and gabapentin overdose.  Reubin Milan MD [facility]  How to contact the [facility] Attending or Consulting provider [place] or covering provider during after hours [place], for this patient?   Check the care team in [facility] and look for a) attending/consulting [facility] provider listed and b) the [facility] team listed Log into [email or web address] and use [place]'s universal password to access. If you do not have the password, please contact the hospital operator. Locate the [facility]: [facility] W provider you are looking for under [facility] and page to a number that you can be directly reached. If you still have difficulty reaching the provider, please page the [facility] (Director on Call) for the Hospitalists listed on amion for assistance.  12/16/2021, 10:39 AM   This document was prepared using Dragon voice recognition software and may contain some unintended transcription errors.

## 2021-12-16 NOTE — Progress Notes (Signed)
Daughter at bedside.

## 2021-12-16 NOTE — Assessment & Plan Note (Signed)
Continue lorazepam 0.5 mg p.o. twice daily as needed. Supportive care. Follow-up with PCP and behavioral health.

## 2021-12-16 NOTE — ED Notes (Signed)
Another message via epic secure chat has been sent to floor RN

## 2021-12-16 NOTE — Assessment & Plan Note (Signed)
Continue atorvastatin 20 mg p.o. daily. Monitor LFTs. Fasting lipid follow-up as an outpatient.

## 2021-12-16 NOTE — Assessment & Plan Note (Signed)
Continue cardio monitoring.

## 2021-12-16 NOTE — Assessment & Plan Note (Signed)
Gentle IV fluids. Hold Seroquel until tonight. Hold gabapentin until tomorrow. Keep electrolytes optimized. Continue close cardiac monitoring. Consult TOC team to begin discharge planning.

## 2021-12-16 NOTE — Progress Notes (Addendum)
[facility] admitting physician addendum:  The patient's primary contact should be her daughter [name] and not Luellen Pucker who is listed first in her contact information.  However, Luellen Pucker has told me that she is currently in [place] and Ms. Laveda Abbe is the daughter who is staying with her at the patient's home in this area.  See her contact information below.  Bailess,[name] Daughter   [phone]   Tennis Must, MD.  Second addendum:  I was able to talk to Ms. Bailess earlier this afternoon.  She is currently helping with the care of her mother while her sister is in [place].  She tells me that her mother has had some personality issues all of her life.  She has been very paranoid with her and some of the other family members.  She has kicked her out of the house at least 4 times.  She is currently staying at her sister's house which is about 20 minutes from the patient's house/farm.  She has been having significant memory issues, but is very adamant about controlling her medications.  They were referred to neurology for neurocognitive assessment but the appointment is not until July.  They have been noticing some decline in the patient since her husband die, but particularly symptoms have been more pronounced in the past few months.  I told her that I feel that the patient is not capable of taking care of herself at home and might need at least assisted living.  Her daughter concurred.  I have added vitamin B12 level to her work-up and consulted behavioral health for formal evaluation.

## 2021-12-16 NOTE — Plan of Care (Signed)
Patient will remain free from falls and injury throughout shift °

## 2021-12-16 NOTE — ED Notes (Signed)
[phone] [name] ([place]) [place]- daughter

## 2021-12-17 DIAGNOSIS — D649 Anemia, unspecified: Secondary | ICD-10-CM

## 2021-12-17 DIAGNOSIS — F411 Generalized anxiety disorder: Secondary | ICD-10-CM

## 2021-12-17 DIAGNOSIS — Z950 Presence of cardiac pacemaker: Secondary | ICD-10-CM

## 2021-12-17 DIAGNOSIS — T50901A Poisoning by unspecified drugs, medicaments and biological substances, accidental (unintentional), initial encounter: Secondary | ICD-10-CM | POA: Diagnosis not present

## 2021-12-17 DIAGNOSIS — E7800 Pure hypercholesterolemia, unspecified: Secondary | ICD-10-CM | POA: Diagnosis not present

## 2021-12-17 DIAGNOSIS — E778 Other disorders of glycoprotein metabolism: Secondary | ICD-10-CM | POA: Diagnosis not present

## 2021-12-17 DIAGNOSIS — I10 Essential (primary) hypertension: Secondary | ICD-10-CM | POA: Diagnosis not present

## 2021-12-17 DIAGNOSIS — E039 Hypothyroidism, unspecified: Secondary | ICD-10-CM | POA: Diagnosis not present

## 2021-12-17 LAB — BASIC METABOLIC PANEL
Anion gap: 5 (ref 5–15)
BUN: 22 mg/dL (ref 8–23)
CO2: 25 mmol/L (ref 22–32)
Calcium: 9 mg/dL (ref 8.9–10.3)
Chloride: 109 mmol/L (ref 98–111)
Creatinine, Ser: 0.85 mg/dL (ref 0.44–1.00)
GFR, Estimated: >60 mL/min (ref >60)
Glucose, Bld: 87 mg/dL (ref 70–99)
Potassium: 4.3 mmol/L (ref 3.5–5.1)
Sodium: 139 mmol/L (ref 135–145)

## 2021-12-17 LAB — MAGNESIUM: Magnesium: 2 mg/dL (ref 1.7–2.4)

## 2021-12-17 LAB — VITAMIN B12: Vitamin B-12: 376 pg/mL (ref 180–914)

## 2021-12-17 LAB — CBC
HCT: 32.6 % — ABNORMAL LOW (ref 36.0–46.0)
Hemoglobin: 10.6 g/dL — ABNORMAL LOW (ref 12.0–15.0)
MCH: 31.5 pg (ref 26.0–34.0)
MCHC: 32.5 g/dL (ref 30.0–36.0)
MCV: 96.7 fL (ref 80.0–100.0)
Platelets: 151 10*3/uL (ref 150–400)
RBC: 3.37 MIL/uL — ABNORMAL LOW (ref 3.87–5.11)
RDW: 12.1 % (ref 11.5–15.5)
WBC: 5.4 10*3/uL (ref 4.0–10.5)
nRBC: 0 % (ref 0.0–0.2)

## 2021-12-17 NOTE — Discharge Summary (Signed)
Physician Discharge Summary  FAMA MUENCHOW FUX:[id number] DOB: [birth year] DOA: 12/15/2021  PCP: Biagio Borg, MD  Admit date: 12/15/2021 Discharge date: 12/17/2021  Admitted From: Home  Discharge disposition: Home  Recommendations for Outpatient Follow-Up:   Follow up with your primary care provider in one week.  Check CBC, BMP, magnesium in the next visit  Discharge Diagnosis:   Principal Problem:   Accidental overdose Active Problems:   Hypothyroidism   Hyperlipidemia   Normocytic anemia   Anxiety state   Essential hypertension   Cardiac pacemaker in situ   Dementia with behavioral disturbance   Hypoproteinemia ([place])   Hyperbilirubinemia   Discharge Condition: Improved.  Diet recommendation: Low sodium, heart healthy.   Wound care: None.  Code status: Full.   History of Present Illness:   Danielle [name] is a 86 y.o. female with past medical history of anemia, anxiety, depression, history of AV block and pacemaker placement, asthmatic bronchitis, history of right breast cancer, DVT, GERD, hypertension hyperlipidemia hypothyroidism presented to the hospital after accidentally taking multiple pills of Seroquel and gabapentin.  In the ED, patient was noted to have stable vitals..  Patient received  sodium bicarb 50 mEq IVP, magnesium sulfate 2 g IVPB and 500 mL of NS bolus.  WBC was 6.0 with hemoglobin of 11.7.  Patient was coronavirus positive but was positive in December as well.  Patient was then admitted hospital for further evaluation and treatment.  Hospital Course:   Following conditions were addressed during hospitalization as listed below,  Accidental overdose On Seroquel and Neurontin.  At this time patient appears to be stable.  Normal vitals.  Labs within normal limits. Patient's daughter at bedside.  Discussed at length regarding supervision with medications.  Advised to follow-up with psychiatry as outpatient.    Dementia with behavioral  disturbance Patient's daughter was advised to regulate her medications at home.    Anxiety state On Ativan.    Hypothyroidism Continue Synthroid.     Hyperlipidemia Continue Lipitor.    Normocytic anemia Latest hemoglobin of 10.6.  Continue to monitor hemoglobin    Essential hypertension Continue atenolol enalapril.       Cardiac pacemaker in situ   Disposition.  At this time, patient is stable for disposition home with outpatient PCP follow-up.  Medical Consultants:   None.  Procedures:    None Subjective:   Today, patient was seen and examined at bedside.  Patient denies any nausea vomiting dizziness lightheadedness or shortness of breath.  Discharge Exam:   Vitals:   12/17/21 0534 12/17/21 0954  BP: (!) 155/74 (!) 154/87  Pulse: (!) 59 67  Resp: 18   Temp: 97.8 F (36.6 C)   SpO2: 97%    Vitals:   12/16/21 2121 12/16/21 2337 12/17/21 0534 12/17/21 0954  BP: (!) 169/80 (!) 151/70 (!) 155/74 (!) 154/87  Pulse: (!) 103 (!) 59 (!) 59 67  Resp:  16 18   Temp: 98.4 F (36.9 C) 97.9 F (36.6 C) 97.8 F (36.6 C)   TempSrc: Oral Oral    SpO2:  98% 97%   Weight: 51.9 kg     Height: 5\' 5"  (1.651 m)       General: Alert awake, not in obvious distress, thinly built. HENT: pupils equally reacting to light,  No scleral pallor or icterus noted. Oral mucosa is moist.  Chest:  .  Diminished breath sounds bilaterally. No crackles or wheezes.  CVS: S1 &S2 heard. No murmur.  Regular rate  and rhythm. Abdomen: Soft, nontender, nondistended.  Bowel sounds are heard.   Extremities: No cyanosis, clubbing or edema.  Peripheral pulses are palpable. Psych: Alert, awake and oriented, normal mood CNS:  No cranial nerve deficits.  Power equal in all extremities.   Skin: Warm and dry.  No rashes noted.  The results of significant diagnostics from this hospitalization (including imaging, microbiology, ancillary and laboratory) are listed below for reference.     Diagnostic  Studies:   No results found.   Labs:   Basic Metabolic Panel: Recent Labs  Lab 12/15/21 1806 12/15/21 2145 12/16/21 0818 12/17/21 0528  NA 138  --  138 139  K 4.5  --  4.2 4.3  CL 106  --  107 109  CO2 26  --  24 25  GLUCOSE 98  --  81 87  BUN 35*  --  27* 22  CREATININE 0.97  --  0.78 0.85  CALCIUM 9.3  --  8.5* 9.0  MG  --  2.2  --  2.0   GFR Estimated Creatinine Clearance: 40.4 mL/min (by C-G formula based on SCr of 0.85 mg/dL). Liver Function Tests: Recent Labs  Lab 12/15/21 1806 12/16/21 0818  AST 21 19  ALT 16 15  ALKPHOS 67 56  BILITOT 1.1 1.3*  PROT 6.8 5.8*  ALBUMIN 4.2 3.5   No results for input(s): LIPASE, AMYLASE in the last 168 hours. No results for input(s): AMMONIA in the last 168 hours. Coagulation profile No results for input(s): INR, PROTIME in the last 168 hours.  CBC: Recent Labs  Lab 12/15/21 2039 12/16/21 0818 12/17/21 0528  WBC 6.0 5.0 5.4  NEUTROABS 2.7  --   --   HGB 11.7* 10.8* 10.6*  HCT 35.7* 33.5* 32.6*  MCV 98.1 96.3 96.7  PLT 172 157 151   Cardiac Enzymes: No results for input(s): CKTOTAL, CKMB, CKMBINDEX, TROPONINI in the last 168 hours. BNP: Invalid input(s): POCBNP CBG: No results for input(s): GLUCAP in the last 168 hours. D-Dimer No results for input(s): DDIMER in the last 72 hours. Hgb A1c No results for input(s): HGBA1C in the last 72 hours. Lipid Profile No results for input(s): CHOL, HDL, LDLCALC, TRIG, CHOLHDL, LDLDIRECT in the last 72 hours. Thyroid function studies No results for input(s): TSH, T4TOTAL, T3FREE, THYROIDAB in the last 72 hours.  Invalid input(s): FREET3 Anemia work up Recent Labs    12/17/21 [place]   Microbiology Recent Results (from the past 240 hour(s))  Resp Panel by RT-PCR (Flu A&B, Covid) Nasopharyngeal Swab     Status: Abnormal   Collection Time: 12/16/21  1:25 AM   Specimen: Nasopharyngeal Swab; Nasopharyngeal(NP) swabs in vial transport medium  Result  Value Ref Range Status   SARS Coronavirus 2 by RT PCR POSITIVE (A) NEGATIVE Final    Comment: (NOTE) SARS-CoV-2 target nucleic acids are DETECTED.  The SARS-CoV-2 RNA is generally detectable in upper respiratory specimens during the acute phase of infection. Positive results are indicative of the presence of the identified virus, but do not rule out bacterial infection or co-infection with other pathogens not detected by the test. Clinical correlation with patient history and other diagnostic information is necessary to determine patient infection status. The expected result is Negative.  Fact Sheet for Patients: EntrepreneurPulse.com.au  Fact Sheet for Healthcare Providers: IncredibleEmployment.be  This test is not yet approved or cleared by the [place] FDA and  has been authorized for detection and/or diagnosis of SARS-CoV-2 by FDA under  an Emergency Use Authorization (EUA).  This EUA will remain in effect (meaning this test can be used) for the duration of  the COVID-19 declaration under Section 564(b)(1) of the A ct, 21 U.S.C. section 360bbb-3(b)(1), unless the authorization is terminated or revoked sooner.     Influenza A by PCR NEGATIVE NEGATIVE Final   Influenza B by PCR NEGATIVE NEGATIVE Final    Comment: (NOTE) The Xpert Xpress SARS-CoV-2/FLU/RSV plus assay is intended as an aid in the diagnosis of influenza from Nasopharyngeal swab specimens and should not be used as a sole basis for treatment. Nasal washings and aspirates are unacceptable for Xpert Xpress SARS-CoV-2/FLU/RSV testing.  Fact Sheet for Patients: EntrepreneurPulse.com.au  Fact Sheet for Healthcare Providers: IncredibleEmployment.be  This test is not yet approved or cleared by the [place] FDA and has been authorized for detection and/or diagnosis of SARS-CoV-2 by FDA under an Emergency Use Authorization (EUA). This EUA  will remain in effect (meaning this test can be used) for the duration of the COVID-19 declaration under Section 564(b)(1) of the Act, 21 U.S.C. section 360bbb-3(b)(1), unless the authorization is terminated or revoked.  Performed at [facility], [place] [address], [place], [place] [address]      Discharge Instructions:   Discharge Instructions     Call MD for:  persistant dizziness or light-headedness   Complete by: As directed    Call MD for:  persistant nausea and vomiting   Complete by: As directed    Call MD for:  severe uncontrolled pain   Complete by: As directed    Diet - low sodium heart healthy   Complete by: As directed    Discharge instructions   Complete by: As directed    Please ensure that you take your medications correctly.  Follow-up with your primary care physician in 1 to 2 weeks.  Seek medical attention for worsening symptoms.   Increase activity slowly   Complete by: As directed       Allergies as of 12/17/2021       Reactions   Codeine Nausea And Vomiting   Oxycodone Nausea And Vomiting        Medication List     TAKE these medications    acetaminophen 500 MG tablet Commonly known as: TYLENOL Take 500-1,000 mg by mouth every 6 (six) hours as needed for mild pain.   atenolol 50 MG tablet Commonly known as: TENORMIN Take 1 tablet (50 mg total) by mouth daily.   atorvastatin 10 MG tablet Commonly known as: LIPITOR TAKE 1 TABLET(10 MG) BY MOUTH DAILY   azelastine 0.1 % nasal spray Commonly known as: ASTELIN Place 1 spray into both nostrils as needed (Summer Allergies). What changed: when to take this   CAL-MAG-ZINC-D PO Take 1 tablet by mouth in the morning and at bedtime.   enalapril 10 MG tablet Commonly known as: VASOTEC Take 1 tablet (10 mg total) by mouth daily.   gabapentin 100 MG capsule Commonly known as: NEURONTIN Take 1 capsule (100 mg total) by mouth 3 (three) times daily. What changed:  how much  to take when to take this additional instructions   levothyroxine 112 MCG tablet Commonly known as: SYNTHROID Take 1 tablet (112 mcg total) by mouth daily before breakfast.   LORazepam 0.5 MG tablet Commonly known as: ATIVAN Take 1 tablet (0.5 mg total) by mouth 2 (two) times daily as needed for anxiety.   QUEtiapine 25 MG tablet Commonly known as: SEROquel Take 1 tablet (  25 mg total) by mouth 3 (three) times daily. What changed: when to take this        Follow-up Information     Biagio Borg, MD Follow up.   Specialties: Internal Medicine, Radiology Contact information: [place] [address] [phone]         Deboraha Sprang, MD .   Specialty: Cardiology Contact information: [phone] [place] [address] [phone]                  Time coordinating discharge: 39 minutes  Signed:  Anner Baity  [facility] 12/17/2021, 3:53 PM

## 2021-12-17 NOTE — Plan of Care (Signed)
PIV removed. DC paperwork reviewed with pt and pt's daughter. Belongings gathered by family. Pt ambulated to lobby with family  Problem: Education: Goal: Knowledge of General Education information will improve Description: Including pain rating scale, medication(s)/side effects and non-pharmacologic comfort measures Outcome: Completed/Met   Problem: Health Behavior/Discharge Planning: Goal: Ability to manage health-related needs will improve Outcome: Completed/Met   Problem: Clinical Measurements: Goal: Ability to maintain clinical measurements within normal limits will improve Outcome: Completed/Met Goal: Will remain free from infection Outcome: Completed/Met Goal: Diagnostic test results will improve Outcome: Completed/Met Goal: Respiratory complications will improve Outcome: Completed/Met Goal: Cardiovascular complication will be avoided Outcome: Completed/Met   Problem: Activity: Goal: Risk for activity intolerance will decrease Outcome: Completed/Met   Problem: Nutrition: Goal: Adequate nutrition will be maintained Outcome: Completed/Met   Problem: Coping: Goal: Level of anxiety will decrease Outcome: Completed/Met   Problem: Elimination: Goal: Will not experience complications related to bowel motility Outcome: Completed/Met Goal: Will not experience complications related to urinary retention Outcome: Completed/Met   Problem: Pain Managment: Goal: General experience of comfort will improve Outcome: Completed/Met   Problem: Safety: Goal: Ability to remain free from injury will improve Outcome: Completed/Met   Problem: Skin Integrity: Goal: Risk for impaired skin integrity will decrease Outcome: Completed/Met

## 2021-12-19 ENCOUNTER — Other Ambulatory Visit: Payer: Self-pay

## 2021-12-19 ENCOUNTER — Encounter (HOSPITAL_COMMUNITY): Payer: Self-pay | Admitting: Emergency Medicine

## 2021-12-19 ENCOUNTER — Telehealth: Payer: Self-pay

## 2021-12-19 ENCOUNTER — Emergency Department (HOSPITAL_COMMUNITY)
Admission: EM | Admit: 2021-12-19 | Discharge: 2021-12-20 | Disposition: A | Discharge status: Home / Self Care | Payer: Medicare HMO | Attending: Emergency Medicine | Admitting: Emergency Medicine

## 2021-12-19 DIAGNOSIS — F03918 Unspecified dementia, unspecified severity, with other behavioral disturbance: Secondary | ICD-10-CM | POA: Insufficient documentation

## 2021-12-19 DIAGNOSIS — F418 Other specified anxiety disorders: Secondary | ICD-10-CM | POA: Diagnosis not present

## 2021-12-19 DIAGNOSIS — E039 Hypothyroidism, unspecified: Secondary | ICD-10-CM | POA: Diagnosis not present

## 2021-12-19 DIAGNOSIS — F419 Anxiety disorder, unspecified: Secondary | ICD-10-CM

## 2021-12-19 DIAGNOSIS — Y900 Blood alcohol level of less than 20 mg/100 ml: Secondary | ICD-10-CM | POA: Diagnosis not present

## 2021-12-19 DIAGNOSIS — Z20822 Contact with and (suspected) exposure to covid-19: Secondary | ICD-10-CM | POA: Insufficient documentation

## 2021-12-19 DIAGNOSIS — I10 Essential (primary) hypertension: Secondary | ICD-10-CM | POA: Diagnosis not present

## 2021-12-19 DIAGNOSIS — R7989 Other specified abnormal findings of blood chemistry: Secondary | ICD-10-CM | POA: Diagnosis not present

## 2021-12-19 DIAGNOSIS — Z79899 Other long term (current) drug therapy: Secondary | ICD-10-CM | POA: Diagnosis not present

## 2021-12-19 DIAGNOSIS — I498 Other specified cardiac arrhythmias: Secondary | ICD-10-CM | POA: Diagnosis not present

## 2021-12-19 DIAGNOSIS — R944 Abnormal results of kidney function studies: Secondary | ICD-10-CM | POA: Insufficient documentation

## 2021-12-19 DIAGNOSIS — Z046 Encounter for general psychiatric examination, requested by authority: Secondary | ICD-10-CM | POA: Diagnosis present

## 2021-12-19 LAB — CBC WITH DIFFERENTIAL/PLATELET
Abs Immature Granulocytes: 0.01 10*3/uL (ref 0.00–0.07)
Basophils Absolute: 0.1 10*3/uL (ref 0.0–0.1)
Basophils Relative: 1 %
Eosinophils Absolute: 0.3 10*3/uL (ref 0.0–0.5)
Eosinophils Relative: 4 %
HCT: 35 % — ABNORMAL LOW (ref 36.0–46.0)
Hemoglobin: 11.1 g/dL — ABNORMAL LOW (ref 12.0–15.0)
Immature Granulocytes: 0 %
Lymphocytes Relative: 26 %
Lymphs Abs: 1.7 10*3/uL (ref 0.7–4.0)
MCH: 30.7 pg (ref 26.0–34.0)
MCHC: 31.7 g/dL (ref 30.0–36.0)
MCV: 97 fL (ref 80.0–100.0)
Monocytes Absolute: 0.7 10*3/uL (ref 0.1–1.0)
Monocytes Relative: 11 %
Neutro Abs: 3.7 10*3/uL (ref 1.7–7.7)
Neutrophils Relative %: 58 %
Platelets: 196 10*3/uL (ref 150–400)
RBC: 3.61 MIL/uL — ABNORMAL LOW (ref 3.87–5.11)
RDW: 12.3 % (ref 11.5–15.5)
WBC: 6.4 10*3/uL (ref 4.0–10.5)
nRBC: 0 % (ref 0.0–0.2)

## 2021-12-19 LAB — RAPID URINE DRUG SCREEN, HOSP PERFORMED
Amphetamines: NOT DETECTED
Barbiturates: NOT DETECTED
Benzodiazepines: NOT DETECTED
Cocaine: NOT DETECTED
Opiates: NOT DETECTED
Tetrahydrocannabinol: NOT DETECTED

## 2021-12-19 LAB — COMPREHENSIVE METABOLIC PANEL
ALT: 16 U/L (ref 0–44)
AST: 23 U/L (ref 15–41)
Albumin: 4.2 g/dL (ref 3.5–5.0)
Alkaline Phosphatase: 64 U/L (ref 38–126)
Anion gap: 9 (ref 5–15)
BUN: 25 mg/dL — ABNORMAL HIGH (ref 8–23)
CO2: 25 mmol/L (ref 22–32)
Calcium: 10.3 mg/dL (ref 8.9–10.3)
Chloride: 105 mmol/L (ref 98–111)
Creatinine, Ser: 1.11 mg/dL — ABNORMAL HIGH (ref 0.44–1.00)
GFR, Estimated: 49 mL/min — ABNORMAL LOW (ref >60)
Glucose, Bld: 96 mg/dL (ref 70–99)
Potassium: 4.5 mmol/L (ref 3.5–5.1)
Sodium: 139 mmol/L (ref 135–145)
Total Bilirubin: 0.8 mg/dL (ref 0.3–1.2)
Total Protein: 6.7 g/dL (ref 6.5–8.1)

## 2021-12-19 LAB — ETHANOL: Alcohol, Ethyl (B): <10 mg/dL (ref <10)

## 2021-12-19 LAB — RESP PANEL BY RT-PCR (FLU A&B, COVID) ARPGX2
Influenza A by PCR: NEGATIVE
Influenza B by PCR: NEGATIVE
SARS Coronavirus 2 by RT PCR: NEGATIVE

## 2021-12-19 NOTE — Telephone Encounter (Signed)
Agree pt should NOT be driving in this condition

## 2021-12-19 NOTE — Telephone Encounter (Signed)
Danielle Danielle is calling in to speak with Dr. Jenny Reichmann Nurse. Pt accidentally OD night time medication. Pt took:  gabapentin (NEURONTIN) 100 MG capsule QUEtiapine (SEROQUEL) 25 MG tablet  Pt took 7 days worth of medication. Pt went across instead of vertical in the pill box.  Pt isn't taking any medication now. Pt is having a Psych break. Pt is also refusing to eat.  Whitney Muse number :[phone]  A Psych eval wasn't completed when she was admitted on 1/26.  We scheduled an appt for 2/1 3:40

## 2021-12-19 NOTE — ED Triage Notes (Signed)
Patient brought in by daughter states patient was just discharged from [facility] after over dosing on her night medication. Daughter states pt was supposed to receive a psychiatric evaluation after she was medically cleared however was never done. Pts daughter states she spoke with PCP today and was told to come to ER for one. Daughter states pts has been kicking her out of house and very paranoid.

## 2021-12-19 NOTE — BH Assessment (Signed)
Clinician reviewed pt's chart in preparation to complete pt's MH Assessment. However, pt is currently in the lobby and cannot be seen at this time. TTS to attempt assessment at a later time after pt has been roomed.

## 2021-12-19 NOTE — ED Provider Triage Note (Signed)
Emergency Medicine Provider Triage Evaluation Note  Danielle [name] , a 86 y.o. female  was evaluated in triage.  Pt complains of paranoia and hallucinations.  Patient was recently discharged from [facility] after overdosing on her night medication.  Daughter states that patient was post receive a psychiatric evaluation after she was medically cleared, but this was never done.  Daughter states patient states spoke with her PCP today who told her to come to the emergency department for psychiatric evaluation.  Daughter also states that patient has been increasingly paranoid, and continues to kick her out of the house.  Review of Systems  Positive: Delusions, paranoia, hallucinations Negative: Suicidal or homicidal ideation  Physical Exam  BP 132/76 (BP Location: Right Arm)    Pulse 63    Temp 97.9 F (36.6 C) (Oral)    Resp 16    Ht 5\' 5"  (1.651 m)    Wt 51.9 kg    SpO2 100%    BMI 19.04 kg/m  Gen:   Awake, no distress   Resp:  Normal effort  MSK:   Moves extremities without difficulty  Other:    Medical Decision Making  Medically screening exam initiated at 7:24 PM.  Appropriate orders placed.  VONETTA FOULK was informed that the remainder of the evaluation will be completed by another provider, this initial triage assessment does not replace that evaluation, and the importance of remaining in the ED until their evaluation is complete.  Will obtain medical clearance and consult TTS   Kateri Plummer, PA-C 12/19/21 1926

## 2021-12-19 NOTE — Telephone Encounter (Signed)
Spoke with pts care giver and she has stated she understood and that the pt is still under a psychosis mental breakdown... Pts care giver states pt is very confused, erratic, and irate. Pt was trying to get away in her car and instead of putting her car in reverse she put it in drive. Pt is in and out with her train of thought.  Pt care giver was advised to contact 911 as pt is not safe to be driving as she is a harm to her self and others while in this mental health crisis. PCP has been notified and agrees that pt needs to go to ED for a psych eval  asap.

## 2021-12-19 NOTE — Telephone Encounter (Signed)
Please advise to return to ED for confusion/psychiatric illness and not taking po as this cannot be addressed well at an OV here, may require hydration or ED psych eval

## 2021-12-20 ENCOUNTER — Encounter (HOSPITAL_COMMUNITY): Payer: Self-pay | Admitting: Student

## 2021-12-20 LAB — TSH: TSH: 1.965 u[IU]/mL (ref 0.350–4.500)

## 2021-12-20 MED ORDER — ENALAPRIL MALEATE 5 MG PO TABS
10.0000 mg | ORAL_TABLET | Freq: Every day | ORAL | Status: DC
  Administered 2021-12-20: 10 mg via ORAL
  Filled 2021-12-20 (×2): qty 2

## 2021-12-20 MED ORDER — GABAPENTIN 100 MG PO CAPS: 200.0000 mg | ORAL_CAPSULE | Freq: Every day | ORAL | Status: DC

## 2021-12-20 MED ORDER — LEVOTHYROXINE SODIUM 112 MCG PO TABS
112.0000 ug | ORAL_TABLET | Freq: Every day | ORAL | Status: DC
Start: 2021-12-20 — End: 2021-12-20
  Administered 2021-12-20: 112 ug via ORAL
  Filled 2021-12-20: qty 1

## 2021-12-20 MED ORDER — ACETAMINOPHEN 325 MG PO TABS: 650.0000 mg | ORAL_TABLET | ORAL | Status: DC | PRN

## 2021-12-20 MED ORDER — AZELASTINE HCL 0.1 % NA SOLN: 1.0000 | Freq: Every day | NASAL | Status: DC | PRN

## 2021-12-20 MED ORDER — LORAZEPAM 0.5 MG PO TABS: 0.5000 mg | ORAL_TABLET | Freq: Two times a day (BID) | ORAL | Status: DC | PRN

## 2021-12-20 MED ORDER — GABAPENTIN 100 MG PO CAPS
100.0000 mg | ORAL_CAPSULE | Freq: Every day | ORAL | Status: DC
  Administered 2021-12-20: 100 mg via ORAL
  Filled 2021-12-20: qty 1

## 2021-12-20 MED ORDER — ADULT MULTIVITAMIN W/MINERALS CH
1.0000 | ORAL_TABLET | Freq: Every day | ORAL | Status: DC
  Administered 2021-12-20: 1 via ORAL
  Filled 2021-12-20: qty 1

## 2021-12-20 MED ORDER — ATENOLOL 25 MG PO TABS
50.0000 mg | ORAL_TABLET | Freq: Every day | ORAL | Status: DC
Start: 2021-12-20 — End: 2021-12-20
  Administered 2021-12-20: 50 mg via ORAL
  Filled 2021-12-20 (×2): qty 2

## 2021-12-20 MED ORDER — ATORVASTATIN CALCIUM 10 MG PO TABS
10.0000 mg | ORAL_TABLET | Freq: Every day | ORAL | Status: DC
  Administered 2021-12-20: 10 mg via ORAL
  Filled 2021-12-20: qty 1

## 2021-12-20 MED ORDER — GABAPENTIN 100 MG PO CAPS: 100.0000 mg | ORAL_CAPSULE | ORAL | Status: DC

## 2021-12-20 MED ORDER — QUETIAPINE FUMARATE 25 MG PO TABS
25.0000 mg | ORAL_TABLET | Freq: Two times a day (BID) | ORAL | Status: DC
  Administered 2021-12-20: 25 mg via ORAL
  Filled 2021-12-20 (×2): qty 1

## 2021-12-20 NOTE — ED Provider Notes (Signed)
Emergency Medicine Observation Re-evaluation Note  Danielle [name] is a 86 y.o. female, seen on rounds today.  Pt initially presented to the ED for complaints of Psychiatric Evaluation Currently, the patient is standing at bedside, dressed and asking to go home.  Physical Exam  BP 132/66    Pulse 62    Temp 97.9 F (36.6 C) (Oral)    Resp 18    Ht 5\' 5"  (1.651 m)    Wt 51.9 kg    SpO2 99%    BMI 19.04 kg/m  Physical Exam General: No distress Lungs: Resp even and unlabored Psych: Calm and cooperative  ED Course / MDM  EKG:   I have reviewed the labs performed to date as well as medications administered while in observation.  Recent changes in the last 24 hours include None.  Plan  Current plan is for discharge home after [facility] evaluation to discuss home health needs, guardianship and other resources for progressive dementia with behavioral disturbances. Patient has otherwise been psych cleared.   ANAHITA CUA is not under involuntary commitment.     Truddie Hidden, MD 12/20/21 1355

## 2021-12-20 NOTE — Progress Notes (Signed)
CSW spoke with patient and her daughter [name] and provided them with in-home care resources and assisted living resources. Patient and [name] stated that is all they need at this time. Patient stated she has daughters so she will not need anyone to come into the home and care for her.

## 2021-12-20 NOTE — BH Assessment (Addendum)
Disposition:   TTS completed. Per Merlyn Lot, NP, patient has been psych cleared. She does not meet criteria for acute psychiatric hospitalization. Denies SI, HI, and AVH's. Currently calm and cooperative. Patient is recommended to follow up with TOC to discuss supportive services for her progressive Dx's of Dementia with behavioral disturbances. Also, her caregiver/daughter may need TOC support to provide information about future guardianship, medical POA, and caregiver services. Based on my observation patient has poor insight and judgement as it relates to making decisions regarding her care. Therefore, the caregiver may need help navigating this process. Merlyn Lot, NP, has placed an order for TOC.  Additionally, patient has been recommended to follow up with a neuropsychiatrist for med management, and has a pending scheduled appointment June 2023.  Clinician  has included additional resources/providers for the caregiver/daughter to contact in hopes of finding a sooner appointment for her mother.   EDP (Dr. [name]) and patient's nurse Luellen Pucker, RN) have been provided with disposition updates.

## 2021-12-20 NOTE — BH Assessment (Signed)
Pt is currently in waiting room. TTS to attempt assessment after Pt has been roomed.

## 2021-12-20 NOTE — ED Notes (Signed)
TTS machine at bedside. Secure chat to requester

## 2021-12-20 NOTE — Discharge Instructions (Addendum)
Your creatinine which looks at kidney function was elevated- have this rechecked by primary care within 2 weeks.

## 2021-12-20 NOTE — BH Assessment (Addendum)
@  1121, via secure chat requested patient's nurse Luellen Pucker, RN) to place the TTS machine in patient's room for her initial TTS assessment

## 2021-12-20 NOTE — ED Provider Notes (Signed)
[facility] EMERGENCY DEPARTMENT Provider Note   CSN: [id number] Arrival date & time: 12/19/21  1821     History  Chief Complaint  Patient presents with   Psychiatric Evaluation    Danielle [name] is a 86 y.o. female with a hx of HTN, HLD, insomnia, hypothyroidism, anxiety, and depression who presents to the ED with her daughter requesting psychiatric evaluation. Per patient's daughter she was recently admitted after accidental overdose, there was plan for psychiatry assessment during hospitalization however this was discontinued/not performed and the patient was discharged. She overdosed on the pills after having kicked her daughter out during a mood fluctuation. She reports since discharge the patient has re-entered her cycle of mood fluctuations. She has had problems with waves of anxiety and paranoia and been dealing with memory loss for several months, this has been occurring since the death of her husband. She started to have spells of anxiety/mood swings over the weekend after discharge, tried PRN ativan that PCP prescribed without much relief. Today discussed w/ PCP who felt patient needed psychiatry evaluation and referred them to the ED. Patient admits to anxiety spells, she really would like to maintain independence. She reports things are worse when she gets upset about her husband. She denies SI, HI, or hallucinations. She currently takes quetiapine.   HPI     Home Medications Prior to Admission medications   Medication Sig Start Date End Date Taking? Authorizing Provider  acetaminophen (TYLENOL) 500 MG tablet Take 500-1,000 mg by mouth every 6 (six) hours as needed for mild pain.    [provider]  atenolol (TENORMIN) 50 MG tablet Take 1 tablet (50 mg total) by mouth daily. 08/30/21   Biagio Borg, MD  atorvastatin (LIPITOR) 10 MG tablet TAKE 1 TABLET(10 MG) BY MOUTH DAILY 08/30/21   Biagio Borg, MD  azelastine (ASTELIN) 0.1 % nasal spray  Place 1 spray into both nostrils as needed (Summer Allergies). Patient taking differently: Place 1 spray into both nostrils every 8 (eight) hours as needed (Summer Allergies). 08/30/21   Biagio Borg, MD  enalapril (VASOTEC) 10 MG tablet Take 1 tablet (10 mg total) by mouth daily. 08/30/21   Biagio Borg, MD  gabapentin (NEURONTIN) 100 MG capsule Take 1 capsule (100 mg total) by mouth 3 (three) times daily. Patient taking differently: Take 100-200 mg by mouth See admin instructions. Take 100mg  in the AM and 200mg  at bedtime. 08/30/21   Biagio Borg, MD  levothyroxine (SYNTHROID) 112 MCG tablet Take 1 tablet (112 mcg total) by mouth daily before breakfast. 11/25/21   Biagio Borg, MD  LORazepam (ATIVAN) 0.5 MG tablet Take 1 tablet (0.5 mg total) by mouth 2 (two) times daily as needed for anxiety. 12/05/21   Biagio Borg, MD  Multiple Minerals-Vitamins (CAL-MAG-ZINC-D PO) Take 1 tablet by mouth in the morning and at bedtime.    [provider]  QUEtiapine (SEROQUEL) 25 MG tablet Take 1 tablet (25 mg total) by mouth 3 (three) times daily. Patient taking differently: Take 25 mg by mouth 2 (two) times daily. 11/23/21   Biagio Borg, MD      Allergies    Codeine and Oxycodone    Review of Systems   Review of Systems  Constitutional:  Negative for chills and fever.  Respiratory:  Negative for shortness of breath.   Cardiovascular:  Negative for chest pain.  Gastrointestinal:  Negative for abdominal pain.  Neurological:  Negative for syncope.  Psychiatric/Behavioral:  Negative for hallucinations and suicidal ideas. The patient is nervous/anxious.   All other systems reviewed and are negative.  Physical Exam Updated Vital Signs BP 115/61 (BP Location: Right Arm)    Pulse 68    Temp 97.9 F (36.6 C) (Oral)    Resp 14    Ht 5\' 5"  (1.651 m)    Wt 51.9 kg    SpO2 100%    BMI 19.04 kg/m  Physical Exam Vitals and nursing note reviewed.  Constitutional:      General: She is not in acute  distress.    Appearance: She is well-developed. She is not toxic-appearing.  HENT:     Head: Normocephalic and atraumatic.  Eyes:     General:        Right eye: No discharge.        Left eye: No discharge.     Conjunctiva/sclera: Conjunctivae normal.  Cardiovascular:     Rate and Rhythm: Normal rate.  Pulmonary:     Effort: Pulmonary effort is normal. No respiratory distress.     Breath sounds: Normal breath sounds.  Abdominal:     General: There is no distension.     Palpations: Abdomen is soft.     Tenderness: There is no abdominal tenderness.  Musculoskeletal:     Cervical back: Neck supple.  Skin:    General: Skin is warm and dry.     Findings: No rash.  Neurological:     Mental Status: She is alert.     Comments: Clear speech.   Psychiatric:        Behavior: Behavior is cooperative.    ED Results / Procedures / Treatments   Labs (all labs ordered are listed, but only abnormal results are displayed) Labs Reviewed  COMPREHENSIVE METABOLIC PANEL - Abnormal; Notable for the following components:      Result Value   BUN 25 (*)    Creatinine, Ser 1.11 (*)    GFR, Estimated 49 (*)    All other components within normal limits  CBC WITH DIFFERENTIAL/PLATELET - Abnormal; Notable for the following components:   RBC 3.61 (*)    Hemoglobin 11.1 (*)    HCT 35.0 (*)    All other components within normal limits  RESP PANEL BY RT-PCR (FLU A&B, COVID) ARPGX2  ETHANOL  RAPID URINE DRUG SCREEN, HOSP PERFORMED    EKG None  Radiology No results found.  Procedures Procedures    Medications Ordered in ED Medications - No data to display  ED Course/ Medical Decision Making/ A&P                           Medical Decision Making Amount and/or Complexity of Data Reviewed Labs: ordered.   Patient presents to the ED for psych evaluation.  Nontoxic, vitals WNL Patient currently calm/cooperative.   Additional history obtained:  Additional history obtained from chart  review/nursing note review.  External records from outside source obtained and reviewed including recent admission 01/26-01/28- presented after accidentally taking multiple pills of seroquel & gabapentin, received tx and observation and was ultimately discharged home. Most recent head CT December 2022- chronic atrophic and ischemic changes without acute abnormality   Lab Tests:  I reviewed and interpreted labs, pertinent results include:  CBC: anemia- hx of same CMP: increased creatinine- oral hydration, PCP recheck.  Ethanol WNL UDS: Negative Covid/flu: negative  ED Course:  Will check TSH given patient w/ hx of thyroid disease.  Given sxs for awhile now waxing/waning with recent head CT do not feel this needs repeating. Patient medically cleared, consult placed to TTS.    Portions of this note were generated with Lobbyist. Dictation errors may occur despite best attempts at proofreading.         Final Clinical Impression(s) / ED Diagnoses Final diagnoses:  Elevated serum creatinine  Anxiety    Rx / DC Orders ED Discharge Orders     None         Amaryllis Dyke, PA-C 12/20/21 [id number]    Orpah Greek, MD 12/20/21 [phone]

## 2021-12-20 NOTE — BH Assessment (Addendum)
@  1104, via secure chat requested patient's nurse Luellen Pucker, RN) to place the TTS machine in patient's room for her initial TTS assessment.

## 2021-12-20 NOTE — BH Assessment (Addendum)
Comprehensive Clinical Assessment (CCA) Note  12/20/2021 Danielle [name] [id number]  Disposition:  TTS completed. Per Merlyn Lot, NP, patient has been psych cleared. She does not meet criteria for acute psychiatric hospitalization. Denies SI, HI, and AVH's. Currently calm and cooperative. Patient is recommended to follow up with TOC to discuss supportive services for her progressive Dx's of Dementia with behavioral disturbances. Also, her caregiver/daughter may need TOC support to provide information about future guardianship, medical POA, and caregiver services. Based on my observation patient has poor insight and judgement as it relates to making decisions regarding her care. Therefore, the caregiver may need help navigating this process. Merlyn Lot, NP, has placed an order for TOC.  Additionally, patient has been recommended to follow up with a neuropsychiatrist for med management, and has a pending scheduled appointment June 2023.  Clinician  has included additional resources/providers for the caregiver/daughter to contact in hopes of finding a sooner appointment for her mother.   EDP (Dr. [name]) and patient's nurse Luellen Pucker, RN) have been provided with disposition updates.   Danielle [name] ED from 12/19/2021 in [facility] ED to Hosp-Admission (Discharged) from 12/15/2021 in [facility] ED from 11/05/2021 in [facility] DEPT  C-SSRS RISK CATEGORY High Risk No Risk No Risk        The patient demonstrates the following risk factors for suicide: Chronic risk factors for suicide include: psychiatric disorder of Dementia Disorder with Behavioral Disturbance. Depressive Disorder, Mild; Anxiety Disorder . Acute risk factors for suicide include: family or marital conflict and Dementia Dx's . Protective factors for this patient include: hope for the future. Considering these factors, the overall  suicide risk at this point appears to be high. Patient is appropriate for outpatient follow up.  EDP (Dr. [name]) and patient's nurse Luellen Pucker, RN) have been provided with disposition updates.   Chief Complaint:  Chief Complaint  Patient presents with   Psychiatric Evaluation   Visit Diagnosis: Dementia Disorder with Behavioral Disturbance. Depressive Disorder, Mild; Anxiety Disorder  EDP Kennith Maes) noted:  Danielle [name] is a 86 y.o. female with a hx of HTN, HLD, insomnia, hypothyroidism, anxiety, and depression who presents to the ED with her daughter requesting psychiatric evaluation. Per patient's daughter she was recently admitted after accidental overdose, there was plan for psychiatry assessment during hospitalization however this was discontinued/not performed and the patient was discharged. She overdosed on the pills after having kicked her daughter out during a mood fluctuation. She reports since discharge the patient has re-entered her cycle of mood fluctuations. She has had problems with waves of anxiety and paranoia and been dealing with memory loss for several months, this has been occurring since the death of her husband. She started to have spells of anxiety/mood swings over the weekend after discharge, tried PRN ativan that PCP prescribed without much relief. Today discussed w/ PCP who felt patient needed psychiatry evaluation and referred them to the ED. Patient admits to anxiety spells, she really would like to maintain independence. She reports things are worse when she gets upset about her husband. She denies SI, HI, or hallucinations. She currently takes quetiapine.   TTS Assessment:  Danielle [name] is a 86 y.o. female with a hx of HTN, HLD, insomnia, hypothyroidism, anxiety, and depression who presents to the ED with her daughter requesting psychiatric evaluation. I haven't been taking my medications as I should, now I am.    Patient denies that  she has a a  mental health dx's and/or significant hx of any mental health symptoms. Also, Denies hx of  suicidal thoughts, attempts, and/or gestures. Patient denies hx of self-injurious behaviors.   Current depressive symptoms: Hopelessness, Loss of interest in usual pleasures, Angry/Irritable, and Fatigue. She doesn't feel that she is sleeping well, most days. Also, doesn't know how many hours she is getting per night. Appetite is poor. She reports 14 pounds of weight loss since December 2022 (5-6 weeks).  Current stressors: My fellow Americans, I find to be very stressful, making decisions that spoil other people's lives, I wish Americans would handle themselves in a much better way.  Spouse passed away 11-28-2020 (57 yrs of marriage).  Adjusting to having her daughter as her caregiver. Also,  keeping up the household (lawn care, house hold duties, etc.).   Patient denies hx of homicidal ideations. No hx of aggressive and/or assaultive behaviors. Denies any legal issues. She reports a hx of auditory hallucinations of Christmas songs. States that she was hearing the songs excessively around December 2022. So far, this January she is hearing the Christmas music intermittently, last episode was 1 week ago. Denies visual hallucinations. Patient's daughter at bedside shares that her mother has little windows where she becomes confused with mixing family members up, confusing her children for her siblings. Her daughter provides another example of patient stating she has spoken to he medical provider (Dr. Jenny Reichmann), and has not done so. Denies hx of alcohol and drug use.   Patient denies a hx of mental health inpatient psychiatric treatment. She had a psychiatrist in the distant past because I was curious about psychiatry, they said it was nothing wrong with me, and sent me on my way. No hx of seeing a therapist.   CCA Screening, Triage and Referral (STR)  Patient Reported Information How did you hear about  [name]? No data recorded What Is the Reason for Your Visit/Call Today? Patient referred by PCP  How Long Has This Been Causing You Problems? > than 6 months  What Do You Feel Would Help You the Most Today? Treatment for Depression or other mood problem; Stress Management; Medication(s)   Have You Recently Had Any Thoughts About Hurting Yourself? No  Are You Planning to Commit Suicide/Harm Yourself At This time? No   Have you Recently Had Thoughts About [place]? No data recorded Are You Planning to Harm Someone at This Time? No  Explanation: No data recorded  Have You Used Any Alcohol or Drugs in the Past 24 Hours? No  How Long Ago Did You Use Drugs or Alcohol? No data recorded What Did You Use and How Much? No data recorded  Do You Currently Have a Therapist/Psychiatrist? No data recorded Name of Therapist/Psychiatrist: No data recorded  Have You Been Recently Discharged From Any Office Practice or Programs? Yes  Explanation of Discharge From Practice/Program: Possibly home health care     CCA Screening Triage Referral Assessment Type of Contact: Face-to-Face  Telemedicine Service Delivery:   Is this Initial or Reassessment? No data recorded Date Telepsych consult ordered in [facility]:  No data recorded Time Telepsych consult ordered in [facility]:  No data recorded Location of Assessment: [facility] ED  Provider Location: [facility]   Collateral Involvement: No data recorded  Does Patient Have a Rudd? No data recorded Name and Contact of Legal Guardian: No data recorded If Minor and Not Living with Parent(s), Who has Custody? No  data recorded Is CPS involved or ever been involved? Never  Is APS involved or ever been involved? Never   Patient Determined To Be At Risk for Harm To Self or Others Based on Review of Patient Reported Information or Presenting Complaint? No  Method: No data recorded Availability of Means: No data  recorded Intent: No data recorded Notification Required: No data recorded Additional Information for Danger to Others Potential: No data recorded Additional Comments for Danger to Others Potential: No data recorded Are There Guns or Other Weapons in Your Home? No data recorded Types of Guns/Weapons: No data recorded Are These Weapons Safely Secured?                            No data recorded Who Could Verify You Are Able To Have These Secured: No data recorded Do You Have any Outstanding Charges, Pending Court Dates, Parole/Probation? No data recorded Contacted To Inform of Risk of Harm To Self or Others: No data recorded   Does Patient Present under Involuntary Commitment? No  IVC Papers Initial File Date: No data recorded  [place] of Residence: [place]   Patient Currently Receiving the Following Services: -- (Patient has no psychiatric services in place at this time.)   Determination of Need: Emergent (2 hours)   Options For Referral: Medication Management; Other: Comment (Nueropsychiatrist for medication management and TOC to provide patient/family/caregiver with recommendations for patient's progressive Dementia Dx's and behavioral disturbances.)     CCA Biopsychosocial Patient Reported Schizophrenia/Schizoaffective Diagnosis in Past: No   Strengths: Patient is pleasant and communicates her personal needs during today's TTS assessment.   Mental Health Symptoms Depression:   Change in energy/activity; Difficulty Concentrating; Irritability (Current depressive symptoms: Hopelessness, Loss of interest in usual pleasures, Angry/Irritable, and Fatigue.)   Duration of Depressive symptoms:  Duration of Depressive Symptoms: Greater than two weeks   Mania:   Irritability   Anxiety:    None   Psychosis:   None   Duration of Psychotic symptoms:    Trauma:   None   Obsessions:   None   Compulsions:   "Driven" to perform behaviors/acts   Inattention:   None    Hyperactivity/Impulsivity:   None   Oppositional/Defiant Behaviors:   Easily annoyed; Argumentative; Temper   Emotional Irregularity:   N/A   Other Mood/Personality Symptoms:   calm and cooperative    Mental Status Exam Appearance and self-care  Stature:   Average   Weight:   Average weight   Clothing:   Neat/clean   Grooming:   Normal   Cosmetic use:   Age appropriate   Posture/gait:   Normal   Motor activity:   Not Remarkable   Sensorium  Attention:   Normal   Concentration:   Normal   Orientation:   Time; Situation; Place; Person; Object   Recall/memory:   Normal   Affect and Mood  Affect:   Depressed   Mood:   Depressed; Anxious   Relating  Eye contact:   Normal   Facial expression:   Depressed; Anxious   Attitude toward examiner:   Cooperative   Thought and Language  Speech flow:  Clear and Coherent   Thought content:   Appropriate to Mood and Circumstances   Preoccupation:   None   Hallucinations:   None   Organization:  No data recorded  [employer] of Knowledge:   Average   Intelligence:   Average  Abstraction:   Normal   Judgement:   Poor   Reality Testing:   Adequate   Insight:   Lacking   Decision Making:   Normal   Social Functioning  Social Maturity:   Isolates; Impulsive   Social Judgement:   Normal   Stress  Stressors:   Other (Comment) (My fellow Americans, I find very stressful, making decisions that spoil other peoples lives, I wish Americans would handle themselves in a much better way.  Spouse passed away 12/16/2020 (57 yrs of marriage).)   Coping Ability:   Overwhelmed   Skill Deficits:   Decision making   Supports:   Family     Religion: Religion/Spirituality Are You A Religious Person?: No  Leisure/Recreation: Leisure / Recreation Do You Have Hobbies?: No  Exercise/Diet: Exercise/Diet Do You Exercise?: No Have You Gained or Lost A  Significant Amount of Weight in the Past Six Months?: Yes-Lost Number of Pounds Lost?:  (14 pounds since December 2022) Do You Follow a Special Diet?: No Do You Have Any Trouble Sleeping?: No   CCA Employment/Education Employment/Work Situation: Employment / Work Nurse, children's Situation: Retired Social research officer, government has Been Impacted by Current Illness: No Has Patient ever Been in Passenger transport manager?: No  Education: Education Is Patient Currently Attending School?: No Last Grade Completed:  (Third year of college) Did Physicist, medical?: Yes Did You Have An Individualized Education Program (IIEP): No Did You Have Any Difficulty At [employer]?: No Patient's Education Has Been Impacted by Current Illness: No   CCA Family/Childhood History Family and Relationship History: Family history Marital status: Widowed Number of Years Married:  (over 76 yrs) Does patient have children?: Yes How many children?:  (4 living; 1 deceased)  Childhood History:  Childhood History Did patient suffer any verbal/emotional/physical/sexual abuse as a child?: No Did patient suffer from severe childhood neglect?: No Has patient ever been sexually abused/assaulted/raped as an adolescent or adult?: No Was the patient ever a victim of a crime or a disaster?: No Witnessed domestic violence?: No Has patient been affected by domestic violence as an adult?: No  Child/Adolescent Assessment:     CCA Substance Use Alcohol/Drug Use: Alcohol / Drug Use Pain Medications: SEE MAR Prescriptions: SEE MAR Over the Counter: SEE MAR History of alcohol / drug use?: No history of alcohol / drug abuse                         ASAM's:  Six Dimensions of Multidimensional Assessment  Dimension 1:  Acute Intoxication and/or Withdrawal Potential:      Dimension 2:  Biomedical Conditions and Complications:      Dimension 3:  Emotional, Behavioral, or Cognitive Conditions and Complications:     Dimension 4:   Readiness to Change:     Dimension 5:  Relapse, Continued use, or Continued Problem Potential:     Dimension 6:  Recovery/Living Environment:     ASAM Severity Score:    ASAM Recommended Level of Treatment:     Substance use Disorder (SUD)    Recommendations for Services/Supports/Treatments: Recommendations for Services/Supports/Treatments Recommendations For Services/Supports/Treatments: Medication Management, Other (Comment) (TOC consult to address Social Work needs.)  Discharge Disposition:    DSM5 Diagnoses: Patient Active Problem List   Diagnosis Date Noted   Accidental overdose 12/16/2021   Hypoproteinemia ([place]) 12/16/2021   Hyperbilirubinemia 12/16/2021   Dementia with behavioral disturbance 11/01/2021   DNR (do not resuscitate) discussion 11/01/2021   Gait disorder 08/27/2021  Nightmare 08/26/2021   Low back pain 04/21/2021   Trigger finger of left hand 07/14/2020   Trigger finger of right hand 07/14/2020   Arthritis 06/11/2020   Trigger middle finger 06/11/2020   Renal calculus, right 02/09/2020   Kidney stone 02/09/2020   Neck pain 11/04/2019   Status post total hip replacement, right 11/04/2019   Urinary incontinence 10/09/2019   History of total hip arthroplasty 04/28/2019   Degenerative joint disease of right hip 12/17/2018   Genetic testing 06/04/2017   Overlapping malignant neoplasm of female breast ([place]) 04/24/2017   Peripheral venous insufficiency 03/15/2017   Subungual hematoma of foot 03/15/2017   Indigestion 03/07/2017   Nausea & vomiting 02/22/2017   Cough 02/22/2017   Blood in urine 02/22/2017   Nausea and vomiting 02/22/2017   S/P right mastectomy 01/11/2017   History of right mastectomy 01/11/2017   Malignant neoplasm of upper-inner quadrant of female breast ([place]) 10/06/2016   Right hip pain 07/18/2016   Olecranon bursitis of left elbow 05/05/2015   Alopecia 01/21/2015   Left leg swelling 01/21/2015   Cervical radiculopathy 07/11/2013    Encounter for well adult exam with abnormal findings 08/03/2012   Dizziness 08/03/2012   Rash 03/26/2012   Pacemaker-medtronic 01/23/2012   Cardiac pacemaker in situ 01/23/2012   Impaired glucose tolerance 07/28/2011   OSTEOARTHRITIS, CERVICAL SPINE 02/25/2010   Dizziness and giddiness 02/25/2010   Spondylosis 02/25/2010   Complete atrioventricular block ([place]) 12/16/2009   Insomnia 06/15/2009   Hypothyroidism 01/09/2008   Hyperlipidemia 01/09/2008   Normocytic anemia 01/09/2008   Anxiety state 01/09/2008   Depressive disorder 01/09/2008   Essential hypertension 01/09/2008   Allergic rhinitis 01/09/2008   Gastroesophageal reflux disease 01/09/2008   DVT, HX OF 01/09/2008   NEPHROLITHIASIS, HX OF 01/09/2008   History of urinary stone 01/09/2008   History of thromboembolism of vein 01/09/2008     Referrals to Alternative Service(s): Referred to Alternative Service(s):   Place:   Date:   Time:    Referred to Alternative Service(s):   Place:   Date:   Time:    Referred to Alternative Service(s):   Place:   Date:   Time:    Referred to Alternative Service(s):   Place:   Date:   Time:     Waldon Merl, Counselor

## 2021-12-20 NOTE — BH Assessment (Signed)
@  1123, called [facility] and spoke to patient's nurse, requested the TTS machine to be placed patient's room for her initial TTS assessment.

## 2021-12-21 ENCOUNTER — Encounter: Payer: Self-pay | Admitting: Internal Medicine

## 2021-12-21 ENCOUNTER — Other Ambulatory Visit: Payer: Self-pay

## 2021-12-21 ENCOUNTER — Ambulatory Visit (INDEPENDENT_AMBULATORY_CARE_PROVIDER_SITE_OTHER): Payer: Medicare HMO | Admitting: Internal Medicine

## 2021-12-21 VITALS — BP 142/82 | HR 64 | Temp 97.6°F | Ht 65.0 in | Wt 109.8 lb

## 2021-12-21 DIAGNOSIS — F03918 Unspecified dementia, unspecified severity, with other behavioral disturbance: Secondary | ICD-10-CM | POA: Diagnosis not present

## 2021-12-21 DIAGNOSIS — I10 Essential (primary) hypertension: Secondary | ICD-10-CM | POA: Diagnosis not present

## 2021-12-21 DIAGNOSIS — F32A Depression, unspecified: Secondary | ICD-10-CM | POA: Diagnosis not present

## 2021-12-21 DIAGNOSIS — R7302 Impaired glucose tolerance (oral): Secondary | ICD-10-CM | POA: Diagnosis not present

## 2021-12-21 DIAGNOSIS — R6889 Other general symptoms and signs: Secondary | ICD-10-CM | POA: Diagnosis not present

## 2021-12-21 NOTE — Patient Instructions (Signed)
Please refrain from driving (no driving) until :  1)  you have the driving test at the [facility] or similar  2)  see the Neuropsychologist as you were referred on Jan 11 by your Neurologist  Please continue all other medications as before, and refills have been done if requested.  Please have the pharmacy call with any other refills you may need.  Please keep your appointments with your specialists as you may have planned

## 2021-12-21 NOTE — Telephone Encounter (Signed)
Patient daughter calling in  Wants to know if nurse would be willing to contact patient & advise her to have someone drive her to the dr instead of patient trying to drive herself  Says patient tends to listen to nurse & dr more than daughters & they are having a hard time w/ patient due to current mental status  Please call patient at [phone]

## 2021-12-21 NOTE — Telephone Encounter (Signed)
Pt's daughter states she has arranged for medical transportation to bring pt to her appt, daughter states she told pt  the provider arranged for her transportation since pt trust provider and will listen to him  Daughter requesting a focus plan of care recommendations for pt per [facility]

## 2021-12-21 NOTE — Telephone Encounter (Signed)
Pt has an appt today, pt's daughter requesting provider discuss some concerns daughter has w/ pt  Daughter states pt refuses to receive help in dispensing medication, pt becomes very upset when daughter tries to help, in result pt over dosed on 1-26, daughter is requesting provider to advise pt to allow help with managing meds  Daughter requesting provider speak to pt about assistant living due to pt's mental health  Daughter requesting provider advise pt not to drive, daughter states pt becomes confused and is afraid pt could harm herself or become lost if allowed to drive

## 2021-12-22 ENCOUNTER — Encounter: Payer: Self-pay | Admitting: Internal Medicine

## 2021-12-22 NOTE — Assessment & Plan Note (Signed)
BP Readings from Last 3 Encounters:  12/21/21 (!) 142/82  12/20/21 (!) 111/56  12/17/21 (!) 154/87   Mild uncontrolled today, pt to continue medical treatment vasotec as declines change for now

## 2021-12-22 NOTE — Assessment & Plan Note (Signed)
Lab Results  Component Value Date   HGBA1C 5.9 11/22/2021   Stable, pt to continue current medical treatment  - none

## 2021-12-22 NOTE — Assessment & Plan Note (Signed)
Agree currently pt not with worsening significant depressive symptoms, no change in tx needed at this time

## 2021-12-22 NOTE — Telephone Encounter (Signed)
Pt's daughter states pt is rejecting home health services, daughter states pt informed her that provider informed her at yesterday's visit that she should "fire her"  Pt's daughter requesting a call back to discuss above concerns   Phone [phone]

## 2021-12-22 NOTE — Progress Notes (Signed)
Patient ID: Danielle [name], female   DOB: [birth year], 86 y.o.   MRN: [id number]        Chief Complaint: follow up driving ability, and psychiatric concern       HPI:  Danielle [name] is a 86 y.o. female here overall calm non agitated today, after messaging from daughter trying recently to oversee her care and keep her safe;  pt has had increase memory and cognitive impairment, most recently trying to use a pill box prepared by family but taking all the pills in a vertical row instead of taking pills at the right time across a certain row of the pill box.  Thereafter pt apparently was driving yesterday, daughter called here asking how to handle.  Pt was seen eventually in ED in psychiatric consultation and not found to require change in treatement and appeared calm.  Pt appears ot have no memory today of this recent ED psychiatric evaluation or neurology evaluation jan 10 where it was suggested/advised to have driving test per [facility] and also referred to neuropsychology for further evaluation.  Pt denies chest pain, increased sob or doe, wheezing, orthopnea, PND, increased LE swelling, palpitations, dizziness or syncope.   Pt denies polydipsia, polyuria, or new focal neuro s/s.   Pt denies fever, wt loss, night sweats, loss of appetite, or other constitutional symptoms  No other new complaints  Wt Readings from Last 3 Encounters:  12/21/21 109 lb 12.8 oz (49.8 kg)  12/19/21 114 lb 6.7 oz (51.9 kg)  12/16/21 114 lb 6.7 oz (51.9 kg)   BP Readings from Last 3 Encounters:  12/21/21 (!) 142/82  12/20/21 (!) 111/56  12/17/21 (!) 154/87         Past Medical History:  Diagnosis Date   ALLERGIC RHINITIS 01/09/2008   ANEMIA-NOS 01/09/2008   pt doesn't remember   ANXIETY 01/09/2008   ASTHMATIC BRONCHITIS, ACUTE 10/20/2008   AV BLOCK, COMPLETE 12/16/2009   Breast cancer, right ([place])    DEPRESSION 01/09/2008   Dizziness and giddiness 02/25/2010   DVT, HX OF 01/09/2008   after delivery of 4 th child    Genetic testing 06/04/2017   Ms. Filippone underwent genetic counseling and testing for hereditary cancer syndromes on 05/24/2017. Her results were negative for mutations in all 46 genes analyzed by [facility]'s 46-gene Common Hereditary Cancers Panel. Genes analyzed include: APC, ATM, AXIN2, BARD1, BMPR1A, BRCA1, BRCA2, BRIP1, CDH1, CDKN2A, CHEK2, CTNNA1, DICER1, EPCAM, GREM1, HOXB13, KIT, MEN1, MLH1, MSH2, MSH3, MSH6, MUTYH, NBN   GERD 01/09/2008   GLUCOSE INTOLERANCE 01/09/2008   History of kidney stones    HYPERLIPIDEMIA 01/09/2008   HYPERTENSION 01/09/2008   HYPOTHYROIDISM 01/09/2008   Impaired glucose tolerance 07/28/2011   INSOMNIA-SLEEP DISORDER-UNSPEC 06/15/2009   NEPHROLITHIASIS, HX OF 01/09/2008   OSTEOARTHRITIS, CERVICAL SPINE 02/25/2010   Presence of permanent cardiac pacemaker    medtronic, pacemaker dependent   Urine incontinence    Past Surgical History:  Procedure Laterality Date   BREAST BIOPSY Right 09/20/2016   malignant   ERCP W/ SPHICTEROTOMY     pt denies   HEMORRHOID SURGERY     pt denies   IR URETERAL STENT RIGHT NEW ACCESS W/O SEP NEPHROSTOMY CATH  02/09/2020   MASTECTOMY Right 01/11/2017   MASTECTOMY W/ SENTINEL NODE BIOPSY Right 01/11/2017   Procedure: RIGHT MASTECTOMY WITH SENTINEL LYMPH NODE BIOPSY;  Surgeon: [name] Klein, MD;  Location: [place];  Service: General;  Laterality: Right;   NEPHROLITHOTOMY Right 02/09/2020   Procedure: NEPHROLITHOTOMY PERCUTANEOUS;  Surgeon: Kathie Rhodes, MD;  Location: [facility];  Service: Urology;  Laterality: Right;   PACEMAKER PLACEMENT Left    MedTronic EnPulse E2DR01--06 no heartbeat without pacemaker   PERMANENT PACEMAKER GENERATOR CHANGE N/A 07/16/2012   Procedure: PERMANENT PACEMAKER GENERATOR CHANGE;  Surgeon: Deboraha Sprang, MD;  [facility] LAB;  Service: Cardiovascular;  Laterality: N/A;   POLYPECTOMY     Uterine   TOTAL HIP ARTHROPLASTY Right 04/28/2019   Procedure: RIGHT TOTAL HIP ARTHROPLASTY ANTERIOR APPROACH;  Surgeon: Leandrew Koyanagi, MD;  Location: [place];  Service: Orthopedics;  Laterality: Right;    reports that she quit smoking about 50 years ago. Her smoking use included cigarettes. She has a 5.00 pack-year smoking history. She has never used smokeless tobacco. She reports current alcohol use. She reports that she does not use drugs. family history includes Breast cancer in her maternal aunt; Breast cancer (age of onset: 32) in her mother; Lung cancer in her paternal grandfather; Pulmonary embolism in her father; Sudden death in her mother. Allergies  Allergen Reactions   Codeine Nausea And Vomiting   Oxycodone Nausea And Vomiting   Current Outpatient Medications on File Prior to Visit  Medication Sig Dispense Refill   acetaminophen (TYLENOL) 500 MG tablet Take 500-1,000 mg by mouth every 6 (six) hours as needed for mild pain.     atenolol (TENORMIN) 50 MG tablet Take 1 tablet (50 mg total) by mouth daily. 90 tablet 3   atorvastatin (LIPITOR) 10 MG tablet TAKE 1 TABLET(10 MG) BY MOUTH DAILY 90 tablet 3   azelastine (ASTELIN) 0.1 % nasal spray Place 1 spray into both nostrils as needed (Summer Allergies). (Patient taking differently: Place 1 spray into both nostrils every 8 (eight) hours as needed (Summer Allergies).) 30 mL 11   enalapril (VASOTEC) 10 MG tablet Take 1 tablet (10 mg total) by mouth daily. 90 tablet 3   gabapentin (NEURONTIN) 100 MG capsule Take 1 capsule (100 mg total) by mouth 3 (three) times daily. (Patient taking differently: Take 100-200 mg by mouth See admin instructions. Take 100mg  in the AM and 200mg  at bedtime.) 90 capsule 3   levothyroxine (SYNTHROID) 112 MCG tablet Take 1 tablet (112 mcg total) by mouth daily before breakfast. 90 tablet 3   LORazepam (ATIVAN) 0.5 MG tablet Take 1 tablet (0.5 mg total) by mouth 2 (two) times daily as needed for anxiety. 30 tablet 1   Multiple Minerals-Vitamins (CAL-MAG-ZINC-D PO) Take 1 tablet by mouth in the morning and at bedtime.     QUEtiapine  (SEROQUEL) 25 MG tablet Take 1 tablet (25 mg total) by mouth 3 (three) times daily. (Patient taking differently: Take 25 mg by mouth 2 (two) times daily.) 270 tablet 1   No current facility-administered medications on file prior to visit.        ROS:  All others reviewed and negative.  Objective        PE:  BP (!) 142/82    Pulse 64    Temp 97.6 F (36.4 C) (Oral)    Ht 5\' 5"  (1.651 m)    Wt 109 lb 12.8 oz (49.8 kg)    SpO2 99%    BMI 18.27 kg/m                 Constitutional: Pt appears in NAD               HENT: Head: NCAT.  Right Ear: External ear normal.                 Left Ear: External ear normal.                Eyes: . Pupils are equal, round, and reactive to light. Conjunctivae and EOM are normal               Nose: without d/c or deformity               Neck: Neck supple. Gross normal ROM               Cardiovascular: Normal rate and regular rhythm.                 Pulmonary/Chest: Effort normal and breath sounds without rales or wheezing.                Abd:  Soft, NT, ND, + BS, no organomegaly               Neurological: Pt is alert. At baseline orientation, motor grossly intact but with impaired ST memory               Skin: Skin is warm. No rashes, no other new lesions, LE edema - none               Psychiatric: Pt behavior is normal without agitation , mild nervous  Micro: none  Cardiac tracings I have personally interpreted today:  none  Pertinent Radiological findings (summarize): none   Lab Results  Component Value Date   WBC 6.4 12/19/2021   HGB 11.1 (L) 12/19/2021   HCT 35.0 (L) 12/19/2021   PLT 196 12/19/2021   GLUCOSE 96 12/19/2021   CHOL 169 11/22/2021   TRIG 139.0 11/22/2021   HDL 73.30 11/22/2021   LDLDIRECT 128.0 05/11/2016   LDLCALC 68 11/22/2021   ALT 16 12/19/2021   AST 23 12/19/2021   NA 139 12/19/2021   K 4.5 12/19/2021   CL 105 12/19/2021   CREATININE 1.11 (H) 12/19/2021   BUN 25 (H) 12/19/2021   CO2 25 12/19/2021   TSH  1.965 12/20/2021   INR 1.0 02/09/2020   HGBA1C 5.9 11/22/2021   Assessment/Plan:  ARGENTINA KOSCH is a 86 y.o. White or Caucasian [1] female with  has a past medical history of ALLERGIC RHINITIS (01/09/2008), ANEMIA-NOS (01/09/2008), ANXIETY (01/09/2008), ASTHMATIC BRONCHITIS, ACUTE (10/20/2008), AV BLOCK, COMPLETE (12/16/2009), Breast cancer, right ([place]), DEPRESSION (01/09/2008), Dizziness and giddiness (02/25/2010), DVT, HX OF (01/09/2008), Genetic testing (06/04/2017), GERD (01/09/2008), GLUCOSE INTOLERANCE (01/09/2008), History of kidney stones, HYPERLIPIDEMIA (01/09/2008), HYPERTENSION (01/09/2008), HYPOTHYROIDISM (01/09/2008), Impaired glucose tolerance (07/28/2011), INSOMNIA-SLEEP DISORDER-UNSPEC (06/15/2009), NEPHROLITHIASIS, HX OF (01/09/2008), OSTEOARTHRITIS, CERVICAL SPINE (02/25/2010), Presence of permanent cardiac pacemaker, and Urine incontinence.  Dementia with behavioral disturbance Pt seems stable and calm today without agitation, some of which I believe relates to friction with daughter trying to oversee her care and keep her safe.  I advised pt again of the plan and advice for driving testing and neuropsychology referral yet to take place, and advised Not to drive until then.  Pt declines other change in tx for now.   Depressive disorder Agree currently pt not with worsening significant depressive symptoms, no change in tx needed at this time  Essential hypertension BP Readings from Last 3 Encounters:  12/21/21 (!) 142/82  12/20/21 (!) 111/56  12/17/21 (!) 154/87   Mild uncontrolled today, pt to continue medical treatment vasotec as declines  change for now   Impaired glucose tolerance Lab Results  Component Value Date   HGBA1C 5.9 11/22/2021   Stable, pt to continue current medical treatment  - none  Followup: Return if symptoms worsen or fail to improve.  Cathlean Cower, MD 12/22/2021 8:31 AM [facility]

## 2021-12-22 NOTE — Assessment & Plan Note (Addendum)
Pt seems stable and calm today without agitation, some of which I believe relates to friction with daughter trying to oversee her care and keep her safe.  I advised pt again of the plan and advice for driving testing and neuropsychology referral yet to take place, and advised Not to drive until then.  Pt declines other change in tx for now.

## 2021-12-23 MED ORDER — QUETIAPINE FUMARATE 25 MG PO TABS: 25.0000 mg | ORAL_TABLET | Freq: Three times a day (TID) | ORAL | 1 refills | Status: DC

## 2021-12-23 MED ORDER — ATORVASTATIN CALCIUM 10 MG PO TABS: ORAL_TABLET | ORAL | 3 refills | Status: DC

## 2021-12-23 MED ORDER — ENALAPRIL MALEATE 10 MG PO TABS: 10.0000 mg | ORAL_TABLET | Freq: Every day | ORAL | 3 refills | Status: DC

## 2021-12-23 MED ORDER — AZELASTINE HCL 0.1 % NA SOLN: 1.0000 | NASAL | 11 refills | Status: DC | PRN

## 2021-12-23 MED ORDER — ATENOLOL 50 MG PO TABS: 50.0000 mg | ORAL_TABLET | Freq: Every day | ORAL | 3 refills | Status: DC

## 2021-12-23 MED ORDER — GABAPENTIN 100 MG PO CAPS: 100.0000 mg | ORAL_CAPSULE | Freq: Three times a day (TID) | ORAL | 3 refills | Status: DC

## 2021-12-23 NOTE — Addendum Note (Signed)
Addended by: Terence Lux A on: 12/23/2021 10:53 AM   Modules accepted: Orders

## 2021-12-23 NOTE — Telephone Encounter (Signed)
I would not feel comfortable with this letter, but I can refer to psychiatry to assess for comptetancy if the daughter likes

## 2021-12-23 NOTE — Telephone Encounter (Signed)
Patient's daughter states that patient is refusing home health services and would like a letter emailed or faxed to her stating specifically that patient needs [facility] for medication management, transportation, house keeping, and other ADLs per patient's request.  Email: [email or web address]

## 2021-12-23 NOTE — Telephone Encounter (Signed)
Patient's daughter declines psychiatry referral at this time but will call back if this changes

## 2022-01-05 ENCOUNTER — Telehealth: Payer: Self-pay | Admitting: Internal Medicine

## 2022-01-05 NOTE — Telephone Encounter (Signed)
Left message to call back  

## 2022-01-05 NOTE — Telephone Encounter (Signed)
Pt c/o Shortness Of Breath: STAT if SOB developed within the last 24 hours or pt is noticeably SOB on the phone  1. Are you currently SOB (can you hear that pt is SOB on the phone)? no  2. How long have you been experiencing SOB? About two days  3. Are you SOB when sitting or when up moving around? All the time  4. Are you currently experiencing any other symptoms? No  Patient has been in and out of the hospital having other tests done, but no one has looked at her heart.  She states there is some "loopy" feeling where her heart feels like it is turning over.   Patient is scheduled to see Joseph Art 01/27/22 but would like to be seen sooner if possible

## 2022-01-06 NOTE — Telephone Encounter (Signed)
Attempted phone call to pt and left voicemail message to contact [phone]. 

## 2022-01-10 NOTE — Telephone Encounter (Signed)
3rd phone call attempt - Attempted phone call to pt.  Left voicemail message to contact triage at [phone] if she needs further assistance.

## 2022-01-27 ENCOUNTER — Encounter: Payer: Self-pay | Admitting: Physician Assistant

## 2022-01-27 ENCOUNTER — Ambulatory Visit (INDEPENDENT_AMBULATORY_CARE_PROVIDER_SITE_OTHER): Payer: Medicare HMO | Admitting: Physician Assistant

## 2022-01-27 ENCOUNTER — Other Ambulatory Visit: Payer: Self-pay

## 2022-01-27 VITALS — BP 118/40 | HR 84 | Ht 65.0 in | Wt 112.0 lb

## 2022-01-27 DIAGNOSIS — Z950 Presence of cardiac pacemaker: Secondary | ICD-10-CM | POA: Diagnosis not present

## 2022-01-27 DIAGNOSIS — I442 Atrioventricular block, complete: Secondary | ICD-10-CM

## 2022-01-27 DIAGNOSIS — I10 Essential (primary) hypertension: Secondary | ICD-10-CM

## 2022-01-27 LAB — CUP PACEART INCLINIC DEVICE CHECK
Battery Impedance: 3736 Ohm
Battery Remaining Longevity: 9 mo
Battery Voltage: 2.67 V
Brady Statistic AP VP Percent: 52 %
Brady Statistic AP VS Percent: 0 %
Brady Statistic AS VP Percent: 48 %
Brady Statistic AS VS Percent: 0 %
Date Time Interrogation Session: 20230310165852
Implantable Lead Implant Date: 19970117
Implantable Lead Implant Date: 19970117
Implantable Lead Location: 753859
Implantable Lead Location: 753860
Implantable Lead Model: 4068
Implantable Lead Model: 4068
Implantable Pulse Generator Implant Date: 20130827
Lead Channel Impedance Value: 754 Ohm
Lead Channel Impedance Value: 788 Ohm
Lead Channel Pacing Threshold Amplitude: 1.5 V
Lead Channel Pacing Threshold Amplitude: 1.75 V
Lead Channel Pacing Threshold Amplitude: 2 V
Lead Channel Pacing Threshold Amplitude: 2.5 V
Lead Channel Pacing Threshold Pulse Width: 0.4 ms
Lead Channel Pacing Threshold Pulse Width: 0.4 ms
Lead Channel Pacing Threshold Pulse Width: 1 ms
Lead Channel Pacing Threshold Pulse Width: 1.25 ms
Lead Channel Sensing Intrinsic Amplitude: 2 mV
Lead Channel Setting Pacing Amplitude: 3 V
Lead Channel Setting Pacing Amplitude: 3.5 V
Lead Channel Setting Pacing Pulse Width: 1 ms
Lead Channel Setting Sensing Sensitivity: 5.6 mV

## 2022-01-27 NOTE — Progress Notes (Signed)
Cardiology Office Note Date:  01/27/2022  Patient ID:  Danielle, [name] 1936-05-15, MRN [id number] PCP:  Biagio Borg, MD  Cardiologist/Electrophysiologist: Dr. Caryl Comes    Chief Complaint:  annual visit  History of Present Illness: Danielle [name] is a 86 y.o. female with history of breast cancer (R), following with oncology, HTN, HLD, hypothyroidism, CHB w/PPM, dementia .  She comes in today to be seen for Dr. Caryl Comes, last seen by him Feb 2020, planned for hip surgery (cleared), mentioned A lead threshold was increased.  She saw A. [place], [place] Feb 2021, doing well, mentioned mild fluttering, no notable device findings, no changes made. She mentioned difficulty with transmissions, though worked well in the office.  I saw her 02/10/21 She is doing quite well, unfortunately though her husband died a month or so ago (though was suffering and is better she says) though she has taken up his chores about the home and farm. She feels physically strong with no exertional intolerances. No CP, palpitations or cardiac awareness No dizzy spells, near syncope or syncope. She reports feeling quite good with no concerns No changes were made.  Ordered a Financial risk analyst est about 2 years  ER visit  12/22/20: AMS, family reported 6 mo of increasing forgetfulness, paranoia, behavioral changes, brought in with her meds from 2003-present.  TOC consulted to aid in neurocognitive out pt consult.   ER/UCC 12/7: diarrhea, urinary incontinence, calf pain, [name] neg for DVT, I do not see a final wrap up for this visit  Admitted 1/26 - 28/2023 with an accidental overdose of seroquel and gabapentin Discharged urged to have close medication surveillance by family and out pt psychiatry eval  ER 12/20/21 ongoing mood swings, waves of anxiety/paranoia, sent to ER it seems by PMD for psychiatry eval Seen by behavioral health, out pt follow up arranged   01/05/21 Pt called with message not feeling well,  palpitations, staff called her back multiple times unable to reach her.  Appt was already in place for her to come in  TODAY She discusses her recent troubles with AMS, says her daughters are helping her and she has done well. She denies any cardiac concerns, no CP, palpitations or cardiac awareness No SOB No syncope or near syncope   Device information MDT dual chamber PPM implanted 1997, gen change 07/16/12   Past Medical History:  Diagnosis Date   ALLERGIC RHINITIS 01/09/2008   ANEMIA-NOS 01/09/2008   pt doesn't remember   ANXIETY 01/09/2008   ASTHMATIC BRONCHITIS, ACUTE 10/20/2008   AV BLOCK, COMPLETE 12/16/2009   Breast cancer, right ([place])    DEPRESSION 01/09/2008   Dizziness and giddiness 02/25/2010   DVT, HX OF 01/09/2008   after delivery of 4 th child   Genetic testing 06/04/2017   Ms. Scheffler underwent genetic counseling and testing for hereditary cancer syndromes on 05/24/2017. Her results were negative for mutations in all 46 genes analyzed by [facility]'s 46-gene Common Hereditary Cancers Panel. Genes analyzed include: APC, ATM, AXIN2, BARD1, BMPR1A, BRCA1, BRCA2, BRIP1, CDH1, CDKN2A, CHEK2, CTNNA1, DICER1, EPCAM, GREM1, HOXB13, KIT, MEN1, MLH1, MSH2, MSH3, MSH6, MUTYH, NBN   GERD 01/09/2008   GLUCOSE INTOLERANCE 01/09/2008   History of kidney stones    HYPERLIPIDEMIA 01/09/2008   HYPERTENSION 01/09/2008   HYPOTHYROIDISM 01/09/2008   Impaired glucose tolerance 07/28/2011   INSOMNIA-SLEEP DISORDER-UNSPEC 06/15/2009   NEPHROLITHIASIS, HX OF 01/09/2008   OSTEOARTHRITIS, CERVICAL SPINE 02/25/2010   Presence of permanent cardiac pacemaker    medtronic, pacemaker  dependent   Urine incontinence     Past Surgical History:  Procedure Laterality Date   BREAST BIOPSY Right 09/20/2016   malignant   ERCP W/ SPHICTEROTOMY     pt denies   HEMORRHOID SURGERY     pt denies   IR URETERAL STENT RIGHT NEW ACCESS W/O SEP NEPHROSTOMY CATH  02/09/2020   MASTECTOMY Right 01/11/2017   MASTECTOMY W/  SENTINEL NODE BIOPSY Right 01/11/2017   Procedure: RIGHT MASTECTOMY WITH SENTINEL LYMPH NODE BIOPSY;  Surgeon: [name] Klein, MD;  Location: [place];  Service: General;  Laterality: Right;   NEPHROLITHOTOMY Right 02/09/2020   Procedure: NEPHROLITHOTOMY PERCUTANEOUS;  Surgeon: Kathie Rhodes, MD;  [facility];  Service: Urology;  Laterality: Right;   PACEMAKER PLACEMENT Left    MedTronic EnPulse E2DR01--06 no heartbeat without pacemaker   PERMANENT PACEMAKER GENERATOR CHANGE N/A 07/16/2012   Procedure: PERMANENT PACEMAKER GENERATOR CHANGE;  Surgeon: Deboraha Sprang, MD;  [facility];  Service: Cardiovascular;  Laterality: N/A;   POLYPECTOMY     Uterine   TOTAL HIP ARTHROPLASTY Right 04/28/2019   Procedure: RIGHT TOTAL HIP ARTHROPLASTY ANTERIOR APPROACH;  Surgeon: Leandrew Koyanagi, MD;  Location: [place];  Service: Orthopedics;  Laterality: Right;    Current Outpatient Medications  Medication Sig Dispense Refill   acetaminophen (TYLENOL) 500 MG tablet Take 500-1,000 mg by mouth every 6 (six) hours as needed for mild pain.     atenolol (TENORMIN) 50 MG tablet Take 1 tablet (50 mg total) by mouth daily. 90 tablet 3   atorvastatin (LIPITOR) 10 MG tablet TAKE 1 TABLET(10 MG) BY MOUTH DAILY 90 tablet 3   azelastine (ASTELIN) 0.1 % nasal spray Place 1 spray into both nostrils as needed (Summer Allergies). 30 mL 11   enalapril (VASOTEC) 10 MG tablet Take 1 tablet (10 mg total) by mouth daily. 90 tablet 3   gabapentin (NEURONTIN) 100 MG capsule Take 1 capsule (100 mg total) by mouth 3 (three) times daily. 90 capsule 3   levothyroxine (SYNTHROID) 112 MCG tablet Take 1 tablet (112 mcg total) by mouth daily before breakfast. 90 tablet 3   LORazepam (ATIVAN) 0.5 MG tablet Take 1 tablet (0.5 mg total) by mouth 2 (two) times daily as needed for anxiety. 30 tablet 1   Multiple Minerals-Vitamins (CAL-MAG-ZINC-D PO) Take 1 tablet by mouth in the morning and at bedtime.     QUEtiapine (SEROQUEL) 25 MG tablet  Take 1 tablet (25 mg total) by mouth 3 (three) times daily. 270 tablet 1   No current facility-administered medications for this visit.    Allergies:   Codeine and Oxycodone   Social History:  The patient  reports that she quit smoking about 50 years ago. Her smoking use included cigarettes. She has a 5.00 pack-year smoking history. She has never used smokeless tobacco. She reports current alcohol use. She reports that she does not use drugs.   Family History:  The patient's family history includes Breast cancer in her maternal aunt; Breast cancer (age of onset: 1) in her mother; Lung cancer in her paternal grandfather; Pulmonary embolism in her father; Sudden death in her mother.  ROS:  Please see the history of present illness.    All other systems are reviewed and otherwise negative.   PHYSICAL EXAM:  VS:  There were no vitals taken for this visit. BMI: There is no height or weight on file to calculate BMI. Well nourished, well developed, in no acute distress HEENT: normocephalic, atraumatic Neck:  no JVD, carotid bruits or masses Cardiac:  RRR; no significant murmurs, no rubs, or gallops Lungs:  CTA b/l, no wheezing, rhonchi or rales Abd: soft, nontender MS: no deformity, age appropriate atrophy Ext: no edema Skin: warm and dry, no rash Neuro:  No gross deficits appreciated Psych: euthymic mood, full affect  PPM site is stable, no tethering or discomfort, she is quite thin, no signs of errosion   EKG:  Not done today  Device interrogation done today and reviewed by myself:  Battery estimate is 46moto ERI Lead impedances are stable No R waves at 50 (VVI, she is symptomatic) RA thresholds today with surface lead support is 2.0/1.25 > programmed 3.0/1.25 RV threshold is up,  variable results with PW 0.4 getting thresholds 1.5-2.0 More consistent 1.0 PW was 1.5-1.75V >> programmed 3.5/1.0, monitor  10/03/2005 Stress myoview EF 69% Normal test  Recent Labs: 12/17/2021:  Magnesium 2.0 12/19/2021: ALT 16; BUN 25; Creatinine, Ser 1.11; Hemoglobin 11.1; Platelets 196; Potassium 4.5; Sodium 139 12/20/2021: TSH 1.965  11/22/2021: Cholesterol 169; HDL 73.30; LDL Cholesterol 68; Total CHOL/HDL Ratio 2; Triglycerides 139.0; VLDL 27.8   CrCl cannot be calculated (Patient's most recent lab result is older than the maximum 21 days allowed.).   Wt Readings from Last 3 Encounters:  12/21/21 109 lb 12.8 oz (49.8 kg)  12/19/21 114 lb 6.7 oz (51.9 kg)  12/16/21 114 lb 6.7 oz (51.9 kg)     Other studies reviewed: Additional studies/records reviewed today include: summarized above  ASSESSMENT AND PLAN:  1. PPM     A lead known to have increased threshold, output increased for 1V margin, she has SB 50;s underlying     RV lead threshold has changed, given dependent outputs changed for 2:1 safety  I will see her back in a month      2. HTN     Looks good  3. HLD     Not addressed today    Disposition: monitor battery/leads, back in a month  Current medicines are reviewed at length with the patient today.  The patient did not have any concerns regarding medicines.  SVenetia Night PA-C 01/27/2022 5:45 AM     [place] [phone](office)  [phone](fax)

## 2022-01-27 NOTE — Patient Instructions (Signed)
Medication Instructions:  ? ?Your physician recommends that you continue on your current medications as directed. Please refer to the Current Medication list given to you today. ? ?*If you need a refill on your cardiac medications before your next appointment, please call your pharmacy* ? ? ?Lab Work:  [place] ? ? ?If you have labs (blood work) drawn today and your tests are completely normal, you will receive your results only by: ?MyChart Message (if you have MyChart) OR ?A paper copy in the mail ?If you have any lab test that is abnormal or we need to change your treatment, we will call you to review the results. ? ? ?Testing/Procedures:  NONE ORDERED  TODAY ? ? ? ?Follow-Up: ?At [facility], you and your health needs are our priority.  As part of our continuing mission to provide you with exceptional heart care, we have created designated Provider Care Teams.  These Care Teams include your primary Cardiologist (physician) and Advanced Practice Providers (APPs -  Physician Assistants and Nurse Practitioners) who all work together to provide you with the care you need, when you need it. ? ?We recommend signing up for the patient portal called "MyChart".  Sign up information is provided on this After Visit Summary.  MyChart is used to connect with patients for Virtual Visits (Telemedicine).  Patients are able to view lab/test results, encounter notes, upcoming appointments, etc.  Non-urgent messages can be sent to your provider as well.   ?To learn more about what you can do with MyChart, go to [email or web address].   ? ?Your next appointment:   ?1 month(s) ? ?The format for your next appointment:   ?In Person ? ?Provider:   ?Tommye Standard, PA-C  ? ? ?Other Instructions ? ?

## 2022-02-01 ENCOUNTER — Encounter: Payer: Self-pay | Admitting: Internal Medicine

## 2022-02-08 ENCOUNTER — Telehealth: Payer: Self-pay | Admitting: *Deleted

## 2022-02-08 ENCOUNTER — Encounter: Payer: Self-pay | Admitting: Internal Medicine

## 2022-02-08 ENCOUNTER — Telehealth: Payer: Self-pay

## 2022-02-08 NOTE — Telephone Encounter (Signed)
Pt  is wanting to advise Dr. Jenny Reichmann that the leg pain has increased and she made an appt to see him 02/20/22 ?

## 2022-02-09 ENCOUNTER — Telehealth: Payer: Self-pay | Admitting: Hematology and Oncology

## 2022-02-09 ENCOUNTER — Encounter: Payer: Self-pay | Admitting: Internal Medicine

## 2022-02-09 NOTE — Telephone Encounter (Signed)
.  Called patient to schedule appointment per 3.22 inbasket, patient daughter is aware of date and time.   ?

## 2022-02-09 NOTE — Telephone Encounter (Signed)
Per VM left by the patient's daughter per need for appt for follow up per history of breast cancer - noted last appt was 2020. ?She states her mother " let her health care follow up go due to taking care of my dad who passed away last year- and mom is developing some dementia " ? ?LOS sent per above. ?

## 2022-02-09 NOTE — Telephone Encounter (Signed)
Pts daughter cx pts 4-3 appt and r/s 4-14 appt to 4-10 ?

## 2022-02-13 MED ORDER — LEVOTHYROXINE SODIUM 112 MCG PO TABS: 112.0000 ug | ORAL_TABLET | Freq: Every day | ORAL | 0 refills | Status: DC

## 2022-02-15 MED ORDER — QUETIAPINE FUMARATE 25 MG PO TABS
25.0000 mg | ORAL_TABLET | Freq: Three times a day (TID) | ORAL | 1 refills | Status: DC
Start: 2022-02-15 — End: 2022-03-05

## 2022-02-15 NOTE — Telephone Encounter (Signed)
Ok this was done for a large rx on feb 9 and should be too soon, but I resent anyway ?

## 2022-02-15 NOTE — Addendum Note (Signed)
Addended by: Biagio Borg on: 02/15/2022 05:42 PM ? ? Modules accepted: Orders ? ?

## 2022-02-20 ENCOUNTER — Ambulatory Visit: Payer: Medicare HMO | Admitting: Internal Medicine

## 2022-02-24 ENCOUNTER — Ambulatory Visit: Payer: Medicare HMO | Admitting: Internal Medicine

## 2022-02-27 ENCOUNTER — Ambulatory Visit (INDEPENDENT_AMBULATORY_CARE_PROVIDER_SITE_OTHER): Payer: Medicare HMO | Admitting: Internal Medicine

## 2022-02-27 ENCOUNTER — Encounter: Payer: Self-pay | Admitting: Internal Medicine

## 2022-02-27 VITALS — BP 118/68 | HR 68 | Temp 97.8°F | Ht 65.0 in | Wt 111.0 lb

## 2022-02-27 DIAGNOSIS — R7302 Impaired glucose tolerance (oral): Secondary | ICD-10-CM | POA: Diagnosis not present

## 2022-02-27 DIAGNOSIS — E559 Vitamin D deficiency, unspecified: Secondary | ICD-10-CM | POA: Diagnosis not present

## 2022-02-27 DIAGNOSIS — E7800 Pure hypercholesterolemia, unspecified: Secondary | ICD-10-CM | POA: Diagnosis not present

## 2022-02-27 DIAGNOSIS — I10 Essential (primary) hypertension: Secondary | ICD-10-CM | POA: Diagnosis not present

## 2022-02-27 DIAGNOSIS — Z0001 Encounter for general adult medical examination with abnormal findings: Secondary | ICD-10-CM | POA: Diagnosis not present

## 2022-02-27 DIAGNOSIS — F03918 Unspecified dementia, unspecified severity, with other behavioral disturbance: Secondary | ICD-10-CM

## 2022-02-27 DIAGNOSIS — E538 Deficiency of other specified B group vitamins: Secondary | ICD-10-CM

## 2022-02-27 DIAGNOSIS — E2839 Other primary ovarian failure: Secondary | ICD-10-CM | POA: Diagnosis not present

## 2022-02-27 DIAGNOSIS — E039 Hypothyroidism, unspecified: Secondary | ICD-10-CM | POA: Diagnosis not present

## 2022-02-27 LAB — CBC WITH DIFFERENTIAL/PLATELET
Basophils Absolute: 0.1 10*3/uL (ref 0.0–0.1)
Basophils Relative: 0.8 % (ref 0.0–3.0)
Eosinophils Absolute: 0.2 10*3/uL (ref 0.0–0.7)
Eosinophils Relative: 2.8 % (ref 0.0–5.0)
HCT: 36 % (ref 36.0–46.0)
Hemoglobin: 12.1 g/dL (ref 12.0–15.0)
Lymphocytes Relative: 24.9 % (ref 12.0–46.0)
Lymphs Abs: 1.8 10*3/uL (ref 0.7–4.0)
MCHC: 33.7 g/dL (ref 30.0–36.0)
MCV: 91.7 fl (ref 78.0–100.0)
Monocytes Absolute: 0.6 10*3/uL (ref 0.1–1.0)
Monocytes Relative: 8.3 % (ref 3.0–12.0)
Neutro Abs: 4.7 10*3/uL (ref 1.4–7.7)
Neutrophils Relative %: 63.2 % (ref 43.0–77.0)
Platelets: 229 10*3/uL (ref 150.0–400.0)
RBC: 3.93 Mil/uL (ref 3.87–5.11)
RDW: 15.8 % — ABNORMAL HIGH (ref 11.5–15.5)
WBC: 7.4 10*3/uL (ref 4.0–10.5)

## 2022-02-27 LAB — BASIC METABOLIC PANEL
BUN: 25 mg/dL — ABNORMAL HIGH (ref 6–23)
CO2: 27 mEq/L (ref 19–32)
Calcium: 10.7 mg/dL — ABNORMAL HIGH (ref 8.4–10.5)
Chloride: 101 mEq/L (ref 96–112)
Creatinine, Ser: 0.98 mg/dL (ref 0.40–1.20)
GFR: 53.1 mL/min — ABNORMAL LOW (ref >60.00)
Glucose, Bld: 88 mg/dL (ref 70–99)
Potassium: 4.6 mEq/L (ref 3.5–5.1)
Sodium: 137 mEq/L (ref 135–145)

## 2022-02-27 LAB — LIPID PANEL
Cholesterol: 263 mg/dL — ABNORMAL HIGH (ref 0–200)
HDL: 103.1 mg/dL (ref >39.00)
NonHDL: 160.06
Total CHOL/HDL Ratio: 3
Triglycerides: 288 mg/dL — ABNORMAL HIGH (ref 0.0–149.0)
VLDL: 57.6 mg/dL — ABNORMAL HIGH (ref 0.0–40.0)

## 2022-02-27 LAB — URINALYSIS, ROUTINE W REFLEX MICROSCOPIC
Bilirubin Urine: NEGATIVE
Ketones, ur: NEGATIVE
Leukocytes,Ua: NEGATIVE
Nitrite: NEGATIVE
Specific Gravity, Urine: 1.02 (ref 1.000–1.030)
Total Protein, Urine: NEGATIVE
Urine Glucose: NEGATIVE
Urobilinogen, UA: 0.2 (ref 0.0–1.0)
pH: 5.5 (ref 5.0–8.0)

## 2022-02-27 LAB — HEPATIC FUNCTION PANEL
ALT: 16 U/L (ref 0–35)
AST: 21 U/L (ref 0–37)
Albumin: 4.6 g/dL (ref 3.5–5.2)
Alkaline Phosphatase: 53 U/L (ref 39–117)
Bilirubin, Direct: 0.1 mg/dL (ref 0.0–0.3)
Total Bilirubin: 1.1 mg/dL (ref 0.2–1.2)
Total Protein: 6.9 g/dL (ref 6.0–8.3)

## 2022-02-27 LAB — VITAMIN B12: Vitamin B-12: 449 pg/mL (ref 211–911)

## 2022-02-27 LAB — HEMOGLOBIN A1C: Hgb A1c MFr Bld: 5.9 % (ref 4.6–6.5)

## 2022-02-27 LAB — LDL CHOLESTEROL, DIRECT: Direct LDL: 135 mg/dL

## 2022-02-27 LAB — TSH: TSH: 18.93 u[IU]/mL — ABNORMAL HIGH (ref 0.35–5.50)

## 2022-02-27 LAB — VITAMIN D 25 HYDROXY (VIT D DEFICIENCY, FRACTURES): VITD: 27.59 ng/mL — ABNORMAL LOW (ref 30.00–100.00)

## 2022-02-27 NOTE — Progress Notes (Signed)
Patient ID: Danielle [name], female   DOB: May 21, 1936, 86 y.o.   MRN: [id number] ? ? ? ?     Chief Complaint:: wellness exam and Follow-up (Patient states that she would like referral to audiology for hearing tests) ? , dementia ? ?     HPI:  Danielle [name] is a 86 y.o. female here for wellness exam with daughter; due for DXA ; decliens covid booster, shingirx. o/w up to date ?         ?              Also has ongoing neurocognitive difficulties with memory loss and emotional symptoms; has been referred and pending neurocognitive eval soon. Pt denies chest pain, increased sob or doe, wheezing, orthopnea, PND, increased LE swelling, palpitations, dizziness or syncope.   Pt denies polydipsia, polyuria, or new focal neuro s/s.    Pt denies fever, wt loss, night sweats, loss of appetite, or other constitutional symptoms  Need refills gabapentin and seroquel.   Denies worsening depressive symptoms, suicidal ideation, or panic; has ongoing anxiety ?  ?Wt Readings from Last 3 Encounters:  ?02/27/22 111 lb (50.3 kg)  ?01/27/22 112 lb (50.8 kg)  ?12/21/21 109 lb 12.8 oz (49.8 kg)  ? ?BP Readings from Last 3 Encounters:  ?02/27/22 118/68  ?01/27/22 (!) 118/40  ?12/21/21 (!) 142/82  ? ?Immunization History  ?Administered Date(s) Administered  ? Fluad Quad(high Dose 65+) 09/11/2019, 08/26/2021  ? Influenza Split 10/31/2011, 08/02/2012  ? Influenza Whole 09/02/2008  ? Influenza, High Dose Seasonal PF 12/15/2016, 12/17/2018  ? PFIZER(Purple Top)SARS-COV-2 Vaccination 03/02/2020, 03/23/2020, 09/04/2020  ? Pneumococcal Conjugate-13 12/18/2014  ? Pneumococcal Polysaccharide-23 01/19/2004, 09/11/2019  ? Td 09/02/2008  ? Tdap 10/23/2019  ? Zoster, Live 12/21/2006  ? ?There are no preventive care reminders to display for this patient. ? ?  ? ?Past Medical History:  ?Diagnosis Date  ? ALLERGIC RHINITIS 01/09/2008  ? ANEMIA-NOS 01/09/2008  ? pt doesn't remember  ? ANXIETY 01/09/2008  ? ASTHMATIC BRONCHITIS, ACUTE 10/20/2008  ? AV BLOCK,  COMPLETE 12/16/2009  ? Breast cancer, right ([place])   ? DEPRESSION 01/09/2008  ? Dizziness and giddiness 02/25/2010  ? DVT, HX OF 01/09/2008  ? after delivery of 4 th child  ? Genetic testing 06/04/2017  ? Ms. Rayner underwent genetic counseling and testing for hereditary cancer syndromes on 05/24/2017. Her results were negative for mutations in all 46 genes analyzed by [facility]'s 46-gene Common Hereditary Cancers Panel. Genes analyzed include: APC, ATM, AXIN2, BARD1, BMPR1A, BRCA1, BRCA2, BRIP1, CDH1, CDKN2A, CHEK2, CTNNA1, DICER1, EPCAM, GREM1, HOXB13, KIT, MEN1, MLH1, MSH2, MSH3, MSH6, MUTYH, NBN  ? GERD 01/09/2008  ? GLUCOSE INTOLERANCE 01/09/2008  ? History of kidney stones   ? HYPERLIPIDEMIA 01/09/2008  ? HYPERTENSION 01/09/2008  ? HYPOTHYROIDISM 01/09/2008  ? Impaired glucose tolerance 07/28/2011  ? INSOMNIA-SLEEP DISORDER-UNSPEC 06/15/2009  ? NEPHROLITHIASIS, HX OF 01/09/2008  ? OSTEOARTHRITIS, CERVICAL SPINE 02/25/2010  ? Presence of permanent cardiac pacemaker   ? medtronic, pacemaker dependent  ? Urine incontinence   ? ?Past Surgical History:  ?Procedure Laterality Date  ? BREAST BIOPSY Right 09/20/2016  ? malignant  ? ERCP W/ SPHICTEROTOMY    ? pt denies  ? HEMORRHOID SURGERY    ? pt denies  ? IR URETERAL STENT RIGHT NEW ACCESS W/O SEP NEPHROSTOMY CATH  02/09/2020  ? MASTECTOMY Right 01/11/2017  ? MASTECTOMY W/ SENTINEL NODE BIOPSY Right 01/11/2017  ? Procedure: RIGHT MASTECTOMY WITH SENTINEL LYMPH NODE BIOPSY;  Surgeon: Dorris Fetch  Barry Dienes, MD;  Location: [facility];  Service: General;  Laterality: Right;  ? NEPHROLITHOTOMY Right 02/09/2020  ? Procedure: NEPHROLITHOTOMY PERCUTANEOUS;  Surgeon: Kathie Rhodes, MD;  Location: [facility];  Service: Urology;  Laterality: Right;  ? PACEMAKER PLACEMENT Left   ? MedTronic EnPulse E2DR01--06 no heartbeat without pacemaker  ? PERMANENT PACEMAKER GENERATOR CHANGE N/A 07/16/2012  ? Procedure: PERMANENT PACEMAKER GENERATOR CHANGE;  Surgeon: Deboraha Sprang, MD;  Location: [facility] CATH LAB;  Service:  Cardiovascular;  Laterality: N/A;  ? POLYPECTOMY    ? Uterine  ? TOTAL HIP ARTHROPLASTY Right 04/28/2019  ? Procedure: RIGHT TOTAL HIP ARTHROPLASTY ANTERIOR APPROACH;  Surgeon: Leandrew Koyanagi, MD;  Location: [place];  Service: Orthopedics;  Laterality: Right;  ? ? reports that she quit smoking about 50 years ago. Her smoking use included cigarettes. She has a 5.00 pack-year smoking history. She has never used smokeless tobacco. She reports current alcohol use. She reports that she does not use drugs. ?family history includes Breast cancer in her maternal aunt; Breast cancer (age of onset: 17) in her mother; Lung cancer in her paternal grandfather; Pulmonary embolism in her father; Sudden death in her mother. ?Allergies  ?Allergen Reactions  ? Codeine Nausea And Vomiting  ? Oxycodone Nausea And Vomiting  ? ?Current Outpatient Medications on File Prior to Visit  ?Medication Sig Dispense Refill  ? acetaminophen (TYLENOL) 500 MG tablet Take 500-1,000 mg by mouth every 6 (six) hours as needed for mild pain.    ? atenolol (TENORMIN) 50 MG tablet Take 1 tablet (50 mg total) by mouth daily. 90 tablet 3  ? atorvastatin (LIPITOR) 10 MG tablet TAKE 1 TABLET(10 MG) BY MOUTH DAILY 90 tablet 3  ? azelastine (ASTELIN) 0.1 % nasal spray Place 1 spray into both nostrils as needed (Summer Allergies). 30 mL 11  ? enalapril (VASOTEC) 10 MG tablet Take 1 tablet (10 mg total) by mouth daily. 90 tablet 3  ? gabapentin (NEURONTIN) 100 MG capsule Take 1 capsule (100 mg total) by mouth 3 (three) times daily. 90 capsule 3  ? levothyroxine (SYNTHROID) 112 MCG tablet Take 1 tablet (112 mcg total) by mouth daily before breakfast. 30 tablet 0  ? LORazepam (ATIVAN) 0.5 MG tablet Take 1 tablet (0.5 mg total) by mouth 2 (two) times daily as needed for anxiety. 30 tablet 1  ? Multiple Minerals-Vitamins (CAL-MAG-ZINC-D PO) Take 1 tablet by mouth in the morning and at bedtime.    ? QUEtiapine (SEROQUEL) 25 MG tablet Take 1 tablet (25 mg total) by mouth 3  (three) times daily. 270 tablet 1  ? ?No current facility-administered medications on file prior to visit.  ? ?     ROS:  All others reviewed and negative. ? ?Objective  ? ?     PE:  BP 118/68 (BP Location: Left Arm, Patient Position: Sitting, Cuff Size: Normal)   Pulse 68   Temp 97.8 ?F (36.6 ?C) (Oral)   Ht $R'5\' 5"'fo$  (1.651 m)   Wt 111 lb (50.3 kg)   SpO2 96%   BMI 18.47 kg/m?  ? ?              Constitutional: Pt appears in NAD ?              HENT: Head: NCAT.  ?              Right Ear: External ear normal.   ?  Left Ear: External ear normal.  ?              Eyes: . Pupils are equal, round, and reactive to light. Conjunctivae and EOM are normal ?              Nose: without d/c or deformity ?              Neck: Neck supple. Gross normal ROM ?              Cardiovascular: Normal rate and regular rhythm.   ?              Pulmonary/Chest: Effort normal and breath sounds without rales or wheezing.  ?              Abd:  Soft, NT, ND, + BS, no organomegaly ?              Neurological: Pt is alert. At baseline orientation, motor grossly intact ?              Skin: Skin is warm. No rashes, no other new lesions, LE edema - none ?              Psychiatric: Pt behavior is normal without agitation  ? ?Micro: none ? ?Cardiac tracings I have personally interpreted today:  none ? ?Pertinent Radiological findings (summarize): none  ? ?Lab Results  ?Component Value Date  ? WBC 7.4 02/27/2022  ? HGB 12.1 02/27/2022  ? HCT 36.0 02/27/2022  ? PLT 229.0 02/27/2022  ? GLUCOSE 88 02/27/2022  ? CHOL 263 (H) 02/27/2022  ? TRIG 288.0 (H) 02/27/2022  ? HDL 103.10 02/27/2022  ? LDLDIRECT 135.0 02/27/2022  ? [place] 68 11/22/2021  ? ALT 16 02/27/2022  ? AST 21 02/27/2022  ? NA 137 02/27/2022  ? K 4.6 02/27/2022  ? CL 101 02/27/2022  ? CREATININE 0.98 02/27/2022  ? BUN 25 (H) 02/27/2022  ? CO2 27 02/27/2022  ? TSH 18.93 (H) 02/27/2022  ? INR 1.0 02/09/2020  ? HGBA1C 5.9 02/27/2022  ? ?Assessment/Plan:  ?GLENDI MOHIUDDIN is a 86  y.o. White or Caucasian [1] female with  has a past medical history of ALLERGIC RHINITIS (01/09/2008), ANEMIA-NOS (01/09/2008), ANXIETY (01/09/2008), ASTHMATIC BRONCHITIS, ACUTE (10/20/2008), AV BLOCK, COMPLETE (12/16/2009

## 2022-02-27 NOTE — Patient Instructions (Signed)
Please schedule the bone density test before leaving today at the scheduling desk (where you check out) ? ?Please continue all other medications as before, and refills have been done if requested. ? ?Please have the pharmacy call with any other refills you may need. ? ?Please continue your efforts at being more active, low cholesterol diet, and weight control. ? ?You are otherwise up to date with prevention measures today. ? ?Please keep your appointments with your specialists as you may have planned - Neurocognitive testing in July 2023 ? ?Please go to the LAB at the blood drawing area for the tests to be done ? ?You will be contacted by phone if any changes need to be made immediately.  Otherwise, you will receive a letter about your results with an explanation, but please check with MyChart first. ? ?Please remember to sign up for MyChart if you have not done so, as this will be important to you in the future with finding out test results, communicating by private email, and scheduling acute appointments online when needed. ? ?Please make an Appointment to return in 6 months, or sooner if needed ?

## 2022-02-28 ENCOUNTER — Inpatient Hospital Stay: Payer: Medicare HMO | Admitting: Hematology and Oncology

## 2022-02-28 ENCOUNTER — Encounter: Payer: Self-pay | Admitting: Internal Medicine

## 2022-03-01 ENCOUNTER — Ambulatory Visit (INDEPENDENT_AMBULATORY_CARE_PROVIDER_SITE_OTHER)
Admission: RE | Admit: 2022-03-01 | Discharge: 2022-03-01 | Disposition: A | Discharge status: Home / Self Care | Payer: Medicare HMO | Source: Ambulatory Visit | Attending: Internal Medicine | Admitting: Internal Medicine

## 2022-03-01 DIAGNOSIS — E2839 Other primary ovarian failure: Secondary | ICD-10-CM

## 2022-03-02 ENCOUNTER — Other Ambulatory Visit: Payer: Self-pay | Admitting: Internal Medicine

## 2022-03-02 MED ORDER — ALENDRONATE SODIUM 70 MG PO TABS: 70.0000 mg | ORAL_TABLET | ORAL | 3 refills | Status: DC

## 2022-03-03 ENCOUNTER — Ambulatory Visit: Payer: Medicare HMO | Admitting: Internal Medicine

## 2022-03-05 ENCOUNTER — Encounter: Payer: Self-pay | Admitting: Internal Medicine

## 2022-03-05 DIAGNOSIS — E559 Vitamin D deficiency, unspecified: Secondary | ICD-10-CM

## 2022-03-05 HISTORY — DX: Vitamin D deficiency, unspecified: E55.9

## 2022-03-05 MED ORDER — GABAPENTIN 100 MG PO CAPS: 100.0000 mg | ORAL_CAPSULE | Freq: Three times a day (TID) | ORAL | 3 refills | Status: DC

## 2022-03-05 MED ORDER — QUETIAPINE FUMARATE 25 MG PO TABS
25.0000 mg | ORAL_TABLET | Freq: Three times a day (TID) | ORAL | 1 refills | Status: DC
Start: 2022-03-05 — End: 2022-09-02

## 2022-03-05 NOTE — Assessment & Plan Note (Signed)
recetnlyl stable, for seroquel refill, f/u neurocognitive testing soon ?

## 2022-03-05 NOTE — Assessment & Plan Note (Signed)
Age and sex appropriate education and counseling updated with regular exercise and diet ?Referrals for preventative services - for DXA ?Immunizations addressed - declines covid booster, shingrix ?Smoking counseling  - none needed ?Evidence for depression or other mood disorder - anxiety depression overall stable ?Most recent labs reviewed. ?I have personally reviewed and have noted: ?1) the patient's medical and social history ?2) The patient's current medications and supplements ?3) The patient's height, weight, and BMI have been recorded in the chart ? ?

## 2022-03-05 NOTE — Assessment & Plan Note (Signed)
Last vitamin D ?Lab Results  ?Component Value Date  ? VD25OH 27.59 (L) 02/27/2022  ? ?Low, to start oral replacement ? ?

## 2022-03-05 NOTE — Assessment & Plan Note (Signed)
BP Readings from Last 3 Encounters:  ?02/27/22 118/68  ?01/27/22 (!) 118/40  ?12/21/21 (!) 142/82  ? ?Stable, pt to continue medical treatment tenormin ? ?

## 2022-03-05 NOTE — Assessment & Plan Note (Signed)
Lab Results  ?Component Value Date  ? HGBA1C 5.9 02/27/2022  ? ?Stable, pt to continue current medical treatment  - diet ? ?

## 2022-03-05 NOTE — Assessment & Plan Note (Signed)
Lab Results  ?Component Value Date  ? TSH 18.93 (H) 02/27/2022  ? ?uncontrolled, pt to continue levothyroxine with better compliance ?

## 2022-03-05 NOTE — Assessment & Plan Note (Signed)
Lab Results  ?Component Value Date  ? [place] 68 11/22/2021  ? ?Stable, pt to continue current statin lipitor 10 ? ?

## 2022-03-08 NOTE — Progress Notes (Addendum)
? ?Cardiology Office Note ?Date:  03/08/2022  ?Patient ID:  Danielle [name], Danielle [name] November 01, 1936, MRN [id number] ?PCP:  Biagio Borg, MD  ?Cardiologist/Electrophysiologist: Dr. Caryl Comes ? ?  ?Chief Complaint:  planned f/u ? ?History of Present Illness: ?Danielle [name] is a 86 y.o. female with history of breast cancer (R), following with oncology, HTN, HLD, hypothyroidism, CHB w/PPM, dementia . ? ?She comes in today to be seen for Dr. Caryl Comes, last seen by him Feb 2020, planned for hip surgery (cleared), mentioned A lead threshold was increased. ? ?She saw A. Pa[place], [place] Feb 2021, doing well, mentioned mild fluttering, no notable device findings, no changes made. ?She mentioned difficulty with transmissions, though worked well in the office. ? ?I saw her 02/10/21 ?She is doing quite well, unfortunately though her husband died a month or so ago (though was suffering and is better she says) though she has taken up his chores about the home and farm. ?She feels physically strong with no exertional intolerances. ?No CP, palpitations or cardiac awareness ?No dizzy spells, near syncope or syncope. ?She reports feeling quite good with no concerns ?No changes were made.  ?Ordered a new transmitter ?Batter est about 2 years ? ?ER visit  ?12/22/20: AMS, family reported 6 mo of increasing forgetfulness, paranoia, behavioral changes, brought in with her meds from 2003-present.  TOC consulted to aid in neurocognitive out pt consult.  ? ?ER/UCC 12/7: diarrhea, urinary incontinence, calf pain, [name] neg for DVT, I do not see a final wrap up for this visit ? ?Admitted 1/26 - 28/2023 with an accidental overdose of seroquel and gabapentin ?Discharged urged to have close medication surveillance by family and out pt psychiatry eval ? ?ER 12/20/21 ongoing mood swings, waves of anxiety/paranoia, sent to ER it seems by PMD for psychiatry eval ?Seen by behavioral health, out pt follow up arranged ? ? ?01/05/21 Pt called with message not feeling well,  palpitations, staff called her back multiple times unable to reach her.  Appt was already in place for her to come in ? ?I saw her 01/27/22 ?She discusses her recent troubles with AMS, says her daughters are helping her and she has done well. ?She denies any cardiac concerns, no CP, palpitations or cardiac awareness ?No SOB ?No syncope or near syncope ?Lead thresholds had increased and outputs were adjusted, nearing ERI (16mo). ? ?TODAY ?No new complaints ?LE neuropathy bothers her ?No CP, palpitations or cardiac awareness ?No SOB ?No dizzy spells, near syncope or syncope ? ?Asks about her pacer battery ? ? ? ?Device information ?MDT dual chamber PPM implanted 1997, gen change 07/16/12 ? ? ?Past Medical History:  ?Diagnosis Date  ? ALLERGIC RHINITIS 01/09/2008  ? ANEMIA-NOS 01/09/2008  ? pt doesn't remember  ? ANXIETY 01/09/2008  ? ASTHMATIC BRONCHITIS, ACUTE 10/20/2008  ? AV BLOCK, COMPLETE 12/16/2009  ? Breast cancer, right ([place])   ? DEPRESSION 01/09/2008  ? Dizziness and giddiness 02/25/2010  ? DVT, HX OF 01/09/2008  ? after delivery of 4 th child  ? Genetic testing 06/04/2017  ? Ms. Trawick underwent genetic counseling and testing for hereditary cancer syndromes on 05/24/2017. Her results were negative for mutations in all 46 genes analyzed by [facility]'s 46-gene Common Hereditary Cancers Panel. Genes analyzed include: APC, ATM, AXIN2, BARD1, BMPR1A, BRCA1, BRCA2, BRIP1, CDH1, CDKN2A, CHEK2, CTNNA1, DICER1, EPCAM, GREM1, HOXB13, KIT, MEN1, MLH1, MSH2, MSH3, MSH6, MUTYH, NBN  ? GERD 01/09/2008  ? GLUCOSE INTOLERANCE 01/09/2008  ? History of kidney stones   ?  HYPERLIPIDEMIA 01/09/2008  ? HYPERTENSION 01/09/2008  ? HYPOTHYROIDISM 01/09/2008  ? Impaired glucose tolerance 07/28/2011  ? INSOMNIA-SLEEP DISORDER-UNSPEC 06/15/2009  ? NEPHROLITHIASIS, HX OF 01/09/2008  ? OSTEOARTHRITIS, CERVICAL SPINE 02/25/2010  ? Presence of permanent cardiac pacemaker   ? medtronic, pacemaker dependent  ? Urine incontinence   ? ? ?Past Surgical History:   ?Procedure Laterality Date  ? BREAST BIOPSY Right 09/20/2016  ? malignant  ? ERCP W/ SPHICTEROTOMY    ? pt denies  ? HEMORRHOID SURGERY    ? pt denies  ? IR URETERAL STENT RIGHT NEW ACCESS W/O SEP NEPHROSTOMY CATH  02/09/2020  ? MASTECTOMY Right 01/11/2017  ? MASTECTOMY W/ SENTINEL NODE BIOPSY Right 01/11/2017  ? Procedure: RIGHT MASTECTOMY WITH SENTINEL LYMPH NODE BIOPSY;  Surgeon: [name] Klein, MD;  Location: [facility];  Service: General;  Laterality: Right;  ? NEPHROLITHOTOMY Right 02/09/2020  ? Procedure: NEPHROLITHOTOMY PERCUTANEOUS;  Surgeon: Kathie Rhodes, MD;  Location: [facility];  Service: Urology;  Laterality: Right;  ? PACEMAKER PLACEMENT Left   ? MedTronic EnPulse E2DR01--06 no heartbeat without pacemaker  ? PERMANENT PACEMAKER GENERATOR CHANGE N/A 07/16/2012  ? Procedure: PERMANENT PACEMAKER GENERATOR CHANGE;  Surgeon: Deboraha Sprang, MD;  Location: [facility] CATH LAB;  Service: Cardiovascular;  Laterality: N/A;  ? POLYPECTOMY    ? Uterine  ? TOTAL HIP ARTHROPLASTY Right 04/28/2019  ? Procedure: RIGHT TOTAL HIP ARTHROPLASTY ANTERIOR APPROACH;  Surgeon: Leandrew Koyanagi, MD;  Location: [facility];  Service: Orthopedics;  Laterality: Right;  ? ? ?Current Outpatient Medications  ?Medication Sig Dispense Refill  ? acetaminophen (TYLENOL) 500 MG tablet Take 500-1,000 mg by mouth every 6 (six) hours as needed for mild pain.    ? alendronate (FOSAMAX) 70 MG tablet Take 1 tablet (70 mg total) by mouth every 7 (seven) days. Take with a full glass of water on an empty stomach. 12 tablet 3  ? atenolol (TENORMIN) 50 MG tablet Take 1 tablet (50 mg total) by mouth daily. 90 tablet 3  ? atorvastatin (LIPITOR) 10 MG tablet TAKE 1 TABLET(10 MG) BY MOUTH DAILY 90 tablet 3  ? azelastine (ASTELIN) 0.1 % nasal spray Place 1 spray into both nostrils as needed (Summer Allergies). 30 mL 11  ? enalapril (VASOTEC) 10 MG tablet Take 1 tablet (10 mg total) by mouth daily. 90 tablet 3  ? gabapentin (NEURONTIN) 100 MG capsule Take 1 capsule (100 mg total) by  mouth 3 (three) times daily. 90 capsule 3  ? levothyroxine (SYNTHROID) 112 MCG tablet Take 1 tablet (112 mcg total) by mouth daily before breakfast. 30 tablet 0  ? LORazepam (ATIVAN) 0.5 MG tablet Take 1 tablet (0.5 mg total) by mouth 2 (two) times daily as needed for anxiety. 30 tablet 1  ? Multiple Minerals-Vitamins (CAL-MAG-ZINC-D PO) Take 1 tablet by mouth in the morning and at bedtime.    ? QUEtiapine (SEROQUEL) 25 MG tablet Take 1 tablet (25 mg total) by mouth 3 (three) times daily. 270 tablet 1  ? ?No current facility-administered medications for this visit.  ? ? ?Allergies:   Codeine and Oxycodone  ? ?Social History:  The patient  reports that she quit smoking about 50 years ago. Her smoking use included cigarettes. She has a 5.00 pack-year smoking history. She has never used smokeless tobacco. She reports current alcohol use. She reports that she does not use drugs.  ? ?Family History:  The patient's family history includes Breast cancer in her maternal aunt; Breast cancer (age of onset: 52) in  her mother; Lung cancer in her paternal grandfather; Pulmonary embolism in her father; Sudden death in her mother. ? ?ROS:  Please see the history of present illness.    ?All other systems are reviewed and otherwise negative.  ? ?PHYSICAL EXAM:  ?VS:  There were no vitals taken for this visit. BMI: There is no height or weight on file to calculate BMI. ?Well nourished, well developed, in no acute distress ?HEENT: normocephalic, atraumatic ?Neck: no JVD, carotid bruits or masses ?Cardiac:  RRR; no significant murmurs, no rubs, or gallops ?Lungs:  CTA b/l, no wheezing, rhonchi or rales ?Abd: soft, nontender ?MS: no deformity, age appropriate atrophy ?Ext: no edema ?Skin: warm and dry, no rash ?Neuro:  No gross deficits appreciated ?Psych: euthymic mood, full affect ? ?PPM site is stable, no tethering or discomfort, she is quite thin, no signs of errosion ? ? ?EKG:  Not done today ? ?Device interrogation done today and  reviewed by myself:  ?RRT reached 03/03/22 ?She has reverted to VVI 65 ?RV lead threshold stable from last visit ? ?10/03/2005 ?Stress myoview ?EF 69% ?Normal test ? ?Recent Labs: ?12/17/2021: Magnesium 2.0 ?

## 2022-03-09 ENCOUNTER — Encounter: Payer: Self-pay | Admitting: *Deleted

## 2022-03-09 ENCOUNTER — Ambulatory Visit (INDEPENDENT_AMBULATORY_CARE_PROVIDER_SITE_OTHER): Payer: Medicare HMO | Admitting: Physician Assistant

## 2022-03-09 ENCOUNTER — Encounter: Payer: Self-pay | Admitting: Physician Assistant

## 2022-03-09 VITALS — BP 128/62 | HR 64 | Ht 65.0 in | Wt 114.0 lb

## 2022-03-09 DIAGNOSIS — I10 Essential (primary) hypertension: Secondary | ICD-10-CM

## 2022-03-09 DIAGNOSIS — Z01818 Encounter for other preprocedural examination: Secondary | ICD-10-CM | POA: Diagnosis not present

## 2022-03-09 DIAGNOSIS — I442 Atrioventricular block, complete: Secondary | ICD-10-CM

## 2022-03-09 DIAGNOSIS — Z45010 Encounter for checking and testing of cardiac pacemaker pulse generator [battery]: Secondary | ICD-10-CM | POA: Diagnosis not present

## 2022-03-09 LAB — CUP PACEART INCLINIC DEVICE CHECK
Battery Impedance: 5057 Ohm
Battery Voltage: 2.64 V
Brady Statistic RV Percent Paced: 99 %
Date Time Interrogation Session: 20230420163615
Implantable Lead Implant Date: 19970117
Implantable Lead Implant Date: 19970117
Implantable Lead Location: 753859
Implantable Lead Location: 753860
Implantable Lead Model: 4068
Implantable Lead Model: 4068
Implantable Pulse Generator Implant Date: 20130827
Lead Channel Impedance Value: 793 Ohm
Lead Channel Impedance Value: 89 Ohm
Lead Channel Pacing Threshold Amplitude: 1.25 V
Lead Channel Pacing Threshold Pulse Width: 1 ms
Lead Channel Setting Pacing Amplitude: 3.5 V
Lead Channel Setting Pacing Pulse Width: 1 ms
Lead Channel Setting Sensing Sensitivity: 5.6 mV

## 2022-03-09 NOTE — Patient Instructions (Addendum)
Medication Instructions:  ? ?Your physician recommends that you continue on your current medications as directed. Please refer to the Current Medication list given to you today. ? ? ?*If you need a refill on your cardiac medications before your next appointment, please call your pharmacy* ? ? ?Lab Work: BMET AND CBC TODAY   ? ?If you have labs (blood work) drawn today and your tests are completely normal, you will receive your results only by: ?MyChart Message (if you have MyChart) OR ?A paper copy in the mail ?If you have any lab test that is abnormal or we need to change your treatment, we will call you to review the results. ? ? ?Testing/Procedures:  SEE LETTER FOR GENERATOR CHANGE on 04-10-22  WITH DR Caryl Comes  ? ? ? ?Follow-Up: ?At [facility], you and your health needs are our priority.  As part of our continuing mission to provide you with exceptional heart care, we have created designated Provider Care Teams.  These Care Teams include your primary Cardiologist (physician) and Advanced Practice Providers (APPs -  Physician Assistants and Nurse Practitioners) who all work together to provide you with the care you need, when you need it. ? ?We recommend signing up for the patient portal called "MyChart".  Sign up information is provided on this After Visit Summary.  MyChart is used to connect with patients for Virtual Visits (Telemedicine).  Patients are able to view lab/test results, encounter notes, upcoming appointments, etc.  Non-urgent messages can be sent to your provider as well.   ?To learn more about what you can do with MyChart, go to [email or web address].   ? ?Your next appointment:   AFTER 04-10-22 10-14 DAYS WITH DEVICE CLINIC  ? ? ?3 month(s) ? ?The format for your next appointment:   ?In Person ? ?Provider:   ?Virl Axe, MD{ ? ? ? ?Other Instructions ? ?Pacemaker Battery Change ? ?A pacemaker battery usually lasts 5-15 years (6-7 years on average). A few times a year, you may be asked to  visit your health care provider to have a full evaluation of your pacemaker. When the battery is low, your pacemaker will be completely replaced. Most often, this procedure is simpler than the first surgery because the wires (leads) that connect the pacemaker to the heart are already in place. ?There are many things that affect how long a pacemaker battery will last, including: ?The age of the pacemaker. ?The number of leads you have(1, 2, or 3). ?The use or workload of the pacemaker. If the pacemaker is helping the heart more often, the battery will not last as long. ?Power (voltage) settings. ?Tell a health care provider about: ?Any allergies you have. ?All medicines you are taking, including vitamins, herbs, eye drops, creams, and over-the-counter medicines. ?Any problems you or family members have had with anesthetic medicines. ?Any blood disorders you have. ?Any surgeries you have had, especially any surgeries you have had since your last pacemaker was placed. ?Any medical conditions you have. ?Whether you are pregnant or may be pregnant. ?What are the risks? ?Generally, this is a safe procedure. However, problems may occur, including: ?Bleeding. ?Infection. ?Nerve damage. ?Allergic reaction to medicines. ?Damage to the leads that go to the heart. ?What happens before the procedure? ?Staying hydrated ?Follow instructions from your health care provider about hydration, which may include: ?Up to 2 hours before the procedure - you may continue to drink clear liquids, such as water, clear fruit juice, black coffee, and plain tea. ?Eating and drinking  restrictions ?Follow instructions from your health care provider about eating and drinking restrictions, which may include: ?8 hours before the procedure - stop eating heavy meals or foods, such as meat, fried foods, or fatty foods. ?6 hours before the procedure - stop eating light meals or foods, such as toast or cereal. ?6 hours before the procedure - stop drinking  milk or drinks that contain milk. ?2 hours before the procedure - stop drinking clear liquids. ?Medicines ?Ask your health care provider about: ?Changing or stopping your regular medicines. This is especially important if you are taking diabetes medicines or blood thinners. ?Taking medicines such as aspirin and ibuprofen. These medicines can thin your blood. Do not take these medicines unless your health care provider tells you to take them. ?Taking over-the-counter medicines, vitamins, herbs, and supplements. ?General instructions ?Ask your health care provider what steps will be taken to help prevent infection. These may include: ?Removing hair at the surgery site. ?Washing skin with a germ-killing soap. ?Receiving antibiotic medicine. ?Plan to have someone take you home from the hospital or clinic. ?If you will be going home right after the procedure, plan to have someone with you for 24 hours. ?What happens during the procedure? ?An IV will be inserted into one of your veins. ?You will be given one or more of the following: ?A medicine to help you relax (sedative). ?A medicine to numb the area where the pacemaker is located (local anesthetic). ?Your health care provider will make an incision to reopen the pocket holding the pacemaker. ?The old pacemaker will be disconnected from the leads. ?The leads will be tested. ?If needed, the leads will be replaced. If the leads are functioning properly, the new pacemaker will be connected to the existing leads. ?A heart monitor and a pacemaker programmer will be used to make sure that the newly implanted pacemaker is working properly. ?The incision site will be closed with stitches (sutures), adhesive strips, or skin glue. ?A bandage (dressing) will be placed over the pacemaker site. ?The procedure may vary among health care providers and hospitals. ?What happens after the procedure? ?Your blood pressure, heart rate, breathing rate, and blood oxygen level will be  monitored until you leave the hospital or clinic. ?You may be given antibiotics. ?Your health care provider will tell you when your pacemaker will need to be tested again, or when to return to the office for removal of the dressing and sutures. ?If you were given a sedative during the procedure, it can affect you for several hours. Do not drive or operate machinery until your health care provider says that it is safe. ?You will be given a pacemaker identification card. This card lists the implant date, device model, and manufacturer of your pacemaker. ?Summary ?A pacemaker battery usually lasts 5-15 years (6-7 years on average). ?When the battery is low, your pacemaker will need to be replaced. ?Most often, this procedure is simpler than the first surgery because the wires (leads) that connect the pacemaker to the heart are already in place. ?Risks of this procedure include bleeding, infection, and allergic reactions to medicines. ?This information is not intended to replace advice given to you by your health care provider. Make sure you discuss any questions you have with your health care provider. ?Document Revised: 10/09/2019 Document Reviewed: 10/09/2019 ?Elsevier Patient Education ? [place]. ? ?Important Information About Sugar ? ? ? ? ?  ?

## 2022-03-10 ENCOUNTER — Encounter: Payer: Self-pay | Admitting: Internal Medicine

## 2022-03-10 ENCOUNTER — Other Ambulatory Visit: Payer: Self-pay | Admitting: *Deleted

## 2022-03-10 DIAGNOSIS — Z79899 Other long term (current) drug therapy: Secondary | ICD-10-CM

## 2022-03-10 LAB — CBC
Hematocrit: 33.2 % — ABNORMAL LOW (ref 34.0–46.6)
Hemoglobin: 10.9 g/dL — ABNORMAL LOW (ref 11.1–15.9)
MCH: 30.4 pg (ref 26.6–33.0)
MCHC: 32.8 g/dL (ref 31.5–35.7)
MCV: 93 fL (ref 79–97)
Platelets: 166 10*3/uL (ref 150–450)
RBC: 3.59 x10E6/uL — ABNORMAL LOW (ref 3.77–5.28)
RDW: 14.4 % (ref 11.7–15.4)
WBC: 7.9 10*3/uL (ref 3.4–10.8)

## 2022-03-10 LAB — BASIC METABOLIC PANEL
BUN/Creatinine Ratio: 28 (ref 12–28)
BUN: 38 mg/dL — ABNORMAL HIGH (ref 8–27)
CO2: 22 mmol/L (ref 20–29)
Calcium: 10.3 mg/dL (ref 8.7–10.3)
Chloride: 105 mmol/L (ref 96–106)
Creatinine, Ser: 1.35 mg/dL — ABNORMAL HIGH (ref 0.57–1.00)
Glucose: 88 mg/dL (ref 70–99)
Potassium: 4.5 mmol/L (ref 3.5–5.2)
Sodium: 141 mmol/L (ref 134–144)
eGFR: 39 mL/min/{1.73_m2} — ABNORMAL LOW (ref >59)

## 2022-03-16 ENCOUNTER — Inpatient Hospital Stay: Payer: Medicare HMO | Admitting: Hematology and Oncology

## 2022-03-28 ENCOUNTER — Ambulatory Visit: Payer: Medicare HMO | Admitting: Internal Medicine

## 2022-04-10 ENCOUNTER — Telehealth: Payer: Self-pay | Admitting: Internal Medicine

## 2022-04-10 ENCOUNTER — Other Ambulatory Visit: Payer: Self-pay

## 2022-04-10 ENCOUNTER — Ambulatory Visit (HOSPITAL_COMMUNITY)
Admission: RE | Admit: 2022-04-10 | Discharge: 2022-04-10 | Disposition: A | Discharge status: Home / Self Care | Payer: Medicare HMO | Attending: Internal Medicine | Admitting: Internal Medicine

## 2022-04-10 ENCOUNTER — Encounter (HOSPITAL_COMMUNITY)
Admission: RE | Disposition: A | Discharge status: Home / Self Care | Payer: Medicare HMO | Source: Home / Self Care | Attending: Internal Medicine

## 2022-04-10 DIAGNOSIS — I442 Atrioventricular block, complete: Secondary | ICD-10-CM | POA: Diagnosis not present

## 2022-04-10 DIAGNOSIS — Z45010 Encounter for checking and testing of cardiac pacemaker pulse generator [battery]: Secondary | ICD-10-CM | POA: Insufficient documentation

## 2022-04-10 HISTORY — PX: PPM GENERATOR CHANGEOUT: EP1233

## 2022-04-10 LAB — BASIC METABOLIC PANEL
Anion gap: 7 (ref 5–15)
BUN: 22 mg/dL (ref 8–23)
CO2: 22 mmol/L (ref 22–32)
Calcium: 9.6 mg/dL (ref 8.9–10.3)
Chloride: 111 mmol/L (ref 98–111)
Creatinine, Ser: 1.01 mg/dL — ABNORMAL HIGH (ref 0.44–1.00)
GFR, Estimated: 55 mL/min — ABNORMAL LOW (ref >60)
Glucose, Bld: 91 mg/dL (ref 70–99)
Potassium: 4.2 mmol/L (ref 3.5–5.1)
Sodium: 140 mmol/L (ref 135–145)

## 2022-04-10 LAB — CBC
HCT: 35.8 % — ABNORMAL LOW (ref 36.0–46.0)
Hemoglobin: 11.9 g/dL — ABNORMAL LOW (ref 12.0–15.0)
MCH: 31.4 pg (ref 26.0–34.0)
MCHC: 33.2 g/dL (ref 30.0–36.0)
MCV: 94.5 fL (ref 80.0–100.0)
Platelets: 163 10*3/uL (ref 150–400)
RBC: 3.79 MIL/uL — ABNORMAL LOW (ref 3.87–5.11)
RDW: 13.9 % (ref 11.5–15.5)
WBC: 5.6 10*3/uL (ref 4.0–10.5)
nRBC: 0 % (ref 0.0–0.2)

## 2022-04-10 SURGERY — PPM GENERATOR CHANGEOUT

## 2022-04-10 MED ORDER — SODIUM CHLORIDE 0.9 % IV SOLN: INTRAVENOUS | Status: DC

## 2022-04-10 MED ORDER — ACETAMINOPHEN 325 MG PO TABS
325.0000 mg | ORAL_TABLET | ORAL | Status: DC | PRN
  Filled 2022-04-10: qty 2

## 2022-04-10 MED ORDER — LIDOCAINE HCL 1 % IJ SOLN
INTRAMUSCULAR | Status: AC
  Filled 2022-04-10: qty 60

## 2022-04-10 MED ORDER — CEFAZOLIN SODIUM-DEXTROSE 2-4 GM/100ML-% IV SOLN
2.0000 g | INTRAVENOUS | Status: AC
  Administered 2022-04-10: 2 g via INTRAVENOUS

## 2022-04-10 MED ORDER — POVIDONE-IODINE 10 % EX SWAB
2.0000 "application " | Freq: Once | CUTANEOUS | Status: AC
  Administered 2022-04-10: 2 via TOPICAL

## 2022-04-10 MED ORDER — CEFAZOLIN SODIUM-DEXTROSE 2-4 GM/100ML-% IV SOLN
INTRAVENOUS | Status: AC
  Filled 2022-04-10: qty 100

## 2022-04-10 MED ORDER — CHLORHEXIDINE GLUCONATE 4 % EX LIQD
4.0000 "application " | Freq: Once | CUTANEOUS | Status: DC
  Filled 2022-04-10: qty 60

## 2022-04-10 MED ORDER — SODIUM CHLORIDE 0.9 % IV SOLN
INTRAVENOUS | Status: AC
  Filled 2022-04-10: qty 2

## 2022-04-10 MED ORDER — SODIUM CHLORIDE 0.9 % IV SOLN
80.0000 mg | INTRAVENOUS | Status: AC
  Administered 2022-04-10: 80 mg

## 2022-04-10 MED ORDER — LIDOCAINE HCL (PF) 1 % IJ SOLN
INTRAMUSCULAR | Status: DC | PRN
  Administered 2022-04-10: 60 mL

## 2022-04-10 SURGICAL SUPPLY — 9 items
CABLE SURGICAL S-101-97-12 (CABLE) ×2 IMPLANT
DEVICE DISSECT PLASMABLAD 3.0S (MISCELLANEOUS) IMPLANT
IPG PACE AZUR XT DR MRI W1DR01 (Pacemaker) IMPLANT
PACE AZURE XT DR MRI W1DR01 (Pacemaker) ×2 IMPLANT
PAD DEFIB RADIO PHYSIO CONN (PAD) ×2 IMPLANT
PLASMABLADE 3.0S (MISCELLANEOUS) ×2
POUCH AIGIS-R ANTIBACT PPM (Mesh General) ×2 IMPLANT
POUCH AIGIS-R ANTIBACT PPM MED (Mesh General) IMPLANT
TRAY PACEMAKER INSERTION (PACKS) ×2 IMPLANT

## 2022-04-10 NOTE — Telephone Encounter (Signed)
Connected to [facility] 5.20.2023.  Caller states she is out of medication. She is out of Levothyroxine and she has low thyroid. States she really needs medication for Levothyroxine and medication for left leg pain-gabapentin. She is supposed to have pacemaker surgery in 2 days. Uses [facility] pharmacy [address], [place], [place] [address].

## 2022-04-10 NOTE — Telephone Encounter (Signed)
PT's daughter calls regarding the RX. States she would like to be called once RX is filled at  [facility]: [phone]

## 2022-04-10 NOTE — Discharge Instructions (Signed)

## 2022-04-10 NOTE — H&P (Signed)
Patient Care Team: Biagio Borg, MD as PCP - Particia Lather, Revonda Standard, MD as PCP - Cardiology (Cardiology) Jovita Kussmaul, MD as Consulting Physician (General Surgery) Magrinat, Virgie Dad, MD (Inactive) as Consulting Physician (Oncology) Ladene Artist, MD as Consulting Physician (Gastroenterology) Deboraha Sprang, MD as Consulting Physician (Cardiology) Kennon Holter, NP as Nurse Practitioner (Nurse Practitioner) Gareth Morgan, MD as Referring Physician (Orthopedic Surgery) Leandrew Koyanagi, MD as Attending Physician (Orthopedic Surgery)   HPI  Danielle [name] is a 86 y.o. female Admitted for generator change for complete heart block ( multiple procedures most recent 2013)  Recurrent AMS over recent months; I recall a phone call regarding dementia, but will need to clarify as no record was made  Seen RU 4/23 with reversion to VVI 65 \  The patient denies chest pain, shortness of breath, nocturnal dyspnea, orthopnea or peripheral edem.  There have been no palpitations,.  Complains of forgetfulness .    Date Cr K TSH Hgb  4/19     9.62    9/19 0.97 4.0   10.0  4/23 1.35 4.5  10.9      Records and Results Reviewed   Past Medical History:  Diagnosis Date   ALLERGIC RHINITIS 01/09/2008   ANEMIA-NOS 01/09/2008   pt doesn't remember   ANXIETY 01/09/2008   ASTHMATIC BRONCHITIS, ACUTE 10/20/2008   AV BLOCK, COMPLETE 12/16/2009   Breast cancer, right ([place])    DEPRESSION 01/09/2008   Dizziness and giddiness 02/25/2010   DVT, HX OF 01/09/2008   after delivery of 4 th child   Genetic testing 06/04/2017   Ms. Mctague underwent genetic counseling and testing for hereditary cancer syndromes on 05/24/2017. Her results were negative for mutations in all 46 genes analyzed by [employer]'s 46-gene Common Hereditary Cancers Panel. Genes analyzed include: APC, ATM, AXIN2, BARD1, BMPR1A, BRCA1, BRCA2, BRIP1, CDH1, CDKN2A, CHEK2, CTNNA1, DICER1, EPCAM, GREM1, HOXB13, KIT, MEN1,  MLH1, MSH2, MSH3, MSH6, MUTYH, NBN   GERD 01/09/2008   GLUCOSE INTOLERANCE 01/09/2008   History of kidney stones    HYPERLIPIDEMIA 01/09/2008   HYPERTENSION 01/09/2008   HYPOTHYROIDISM 01/09/2008   Impaired glucose tolerance 07/28/2011   INSOMNIA-SLEEP DISORDER-UNSPEC 06/15/2009   NEPHROLITHIASIS, HX OF 01/09/2008   OSTEOARTHRITIS, CERVICAL SPINE 02/25/2010   Presence of permanent cardiac pacemaker    medtronic, pacemaker dependent   Urine incontinence     Past Surgical History:  Procedure Laterality Date   BREAST BIOPSY Right 09/20/2016   malignant   ERCP W/ SPHICTEROTOMY     pt denies   HEMORRHOID SURGERY     pt denies   IR URETERAL STENT RIGHT NEW ACCESS W/O SEP NEPHROSTOMY CATH  02/09/2020   MASTECTOMY Right 01/11/2017   MASTECTOMY W/ SENTINEL NODE BIOPSY Right 01/11/2017   Procedure: RIGHT MASTECTOMY WITH SENTINEL LYMPH NODE BIOPSY;  Surgeon: [name] Zierra Laroque, MD;  Location: [facility];  Service: General;  Laterality: Right;   NEPHROLITHOTOMY Right 02/09/2020   Procedure: NEPHROLITHOTOMY PERCUTANEOUS;  Surgeon: Kathie Rhodes, MD;  Location: [facility];  Service: Urology;  Laterality: Right;   PACEMAKER PLACEMENT Left    MedTronic EnPulse [id number] no heartbeat without pacemaker   PERMANENT PACEMAKER GENERATOR CHANGE N/A 07/16/2012   Procedure: PERMANENT PACEMAKER GENERATOR CHANGE;  Surgeon: Deboraha Sprang, MD;  Location: [facility] CATH LAB;  Service: Cardiovascular;  Laterality: N/A;   POLYPECTOMY     Uterine   TOTAL HIP ARTHROPLASTY Right 04/28/2019   Procedure: RIGHT TOTAL HIP  ARTHROPLASTY ANTERIOR APPROACH;  Surgeon: Leandrew Koyanagi, MD;  Location: [facility];  Service: Orthopedics;  Laterality: Right;    Current Facility-Administered Medications  Medication Dose Route Frequency Provider Last Rate Last Admin   0.9 %  sodium chloride infusion   Intravenous Continuous Deboraha Sprang, MD 50 mL/hr at 04/10/22 1411 New Bag at 04/10/22 1411   ceFAZolin (ANCEF) IVPB 2g/100 mL premix  2 g Intravenous On Call Deboraha Sprang, MD       chlorhexidine (HIBICLENS) 4 % liquid 4 application.  4 application. Topical Once Deboraha Sprang, MD       gentamicin (GARAMYCIN) 80 mg in sodium chloride 0.9 % 500 mL irrigation  80 mg Irrigation On Call Deboraha Sprang, MD        Allergies  Allergen Reactions   Codeine Nausea And Vomiting    Pt is unaware of reaction    Oxycodone Nausea And Vomiting    Pt is unaware of reaction       Social History   Tobacco Use   Smoking status: Former    Packs/day: 0.20    Years: 25.00    Pack years: 5.00    Types: Cigarettes    Quit date: 11/21/1971    Years since quitting: 50.4   Smokeless tobacco: Never  Vaping Use   Vaping Use: Never used  Substance Use Topics   Alcohol use: Yes    Comment: wine   Drug use: No     Family History  Problem Relation Age of Onset   Sudden death Mother        d.59s   Breast cancer Mother 23   Pulmonary embolism Father        Possible   Lung cancer Paternal Grandfather        history of smoking   Breast cancer Maternal Aunt        d.78s     Current Meds  Medication Sig   acetaminophen (TYLENOL) 500 MG tablet Take 500-1,000 mg by mouth every 6 (six) hours as needed for mild pain.   alendronate (FOSAMAX) 70 MG tablet Take 1 tablet (70 mg total) by mouth every 7 (seven) days. Take with a full glass of water on an empty stomach.   atenolol (TENORMIN) 50 MG tablet Take 1 tablet (50 mg total) by mouth daily.   atorvastatin (LIPITOR) 10 MG tablet TAKE 1 TABLET(10 MG) BY MOUTH DAILY   azelastine (ASTELIN) 0.1 % nasal spray Place 1 spray into both nostrils as needed (Summer Allergies).   enalapril (VASOTEC) 10 MG tablet Take 1 tablet (10 mg total) by mouth daily.   gabapentin (NEURONTIN) 100 MG capsule Take 1 capsule (100 mg total) by mouth 3 (three) times daily.   levothyroxine (SYNTHROID) 112 MCG tablet Take 1 tablet (112 mcg total) by mouth daily before breakfast.   LORazepam (ATIVAN) 0.5 MG tablet Take 1 tablet (0.5 mg total) by  mouth 2 (two) times daily as needed for anxiety.   Multiple Minerals-Vitamins (CAL-MAG-ZINC-D PO) Take 1 tablet by mouth in the morning and at bedtime.   QUEtiapine (SEROQUEL) 25 MG tablet Take 1 tablet (25 mg total) by mouth 3 (three) times daily.     Review of Systems negative except from HPI and PMH  Physical Exam BP (!) 131/97   Pulse 65   Temp 98.6 F (37 C) (Oral)   Resp 18   Ht _0  (1.6 m)   Wt 51.7 kg   SpO2 99%  BMI 20.19 kg/m  Well developed and well nourished in no acute distress HENT normal E scleral and icterus clear Neck Supple JVP flat; carotids brisk and full Clear to ausculation Regular rate and rhythm, no murmurs gallops or rub Soft with active bowel sounds No clubbing cyanosis  Edema Alert and oriented, grossly normal motor and sensory function Skin Warm and Dry    Assessment and  Plan  Complete heart block    Pacemaker  Medtronic     Atrial lead threshold increase   We have reviewed the benefits and risks of generator replacement.  These include but are not limited to lead fracture and infection.  The patient understands, agrees and is willing to proceed.

## 2022-04-11 ENCOUNTER — Encounter (HOSPITAL_COMMUNITY): Payer: Self-pay | Admitting: Internal Medicine

## 2022-04-11 MED ORDER — LEVOTHYROXINE SODIUM 112 MCG PO TABS: 112.0000 ug | ORAL_TABLET | Freq: Every day | ORAL | 3 refills | Status: DC

## 2022-04-11 NOTE — Telephone Encounter (Signed)
Notified daughter [facility]

## 2022-04-13 ENCOUNTER — Encounter: Payer: Self-pay | Admitting: Internal Medicine

## 2022-04-13 DIAGNOSIS — F419 Anxiety disorder, unspecified: Secondary | ICD-10-CM

## 2022-04-13 DIAGNOSIS — F03918 Unspecified dementia, unspecified severity, with other behavioral disturbance: Secondary | ICD-10-CM

## 2022-04-13 DIAGNOSIS — F32A Depression, unspecified: Secondary | ICD-10-CM

## 2022-04-14 ENCOUNTER — Encounter: Payer: Self-pay | Admitting: Internal Medicine

## 2022-04-19 ENCOUNTER — Encounter: Payer: Self-pay | Admitting: Neurology

## 2022-04-20 ENCOUNTER — Other Ambulatory Visit: Payer: Medicare HMO | Admitting: *Deleted

## 2022-04-20 ENCOUNTER — Ambulatory Visit (INDEPENDENT_AMBULATORY_CARE_PROVIDER_SITE_OTHER): Payer: Medicare HMO

## 2022-04-20 DIAGNOSIS — I442 Atrioventricular block, complete: Secondary | ICD-10-CM | POA: Diagnosis not present

## 2022-04-20 DIAGNOSIS — Z79899 Other long term (current) drug therapy: Secondary | ICD-10-CM

## 2022-04-20 LAB — CUP PACEART INCLINIC DEVICE CHECK
Battery Remaining Longevity: 113 mo
Battery Voltage: 3.2 V
Brady Statistic AP VP Percent: 59.45 %
Brady Statistic AP VS Percent: 0.03 %
Brady Statistic AS VP Percent: 38.87 %
Brady Statistic AS VS Percent: 1.65 %
Brady Statistic RA Percent Paced: 60.24 %
Brady Statistic RV Percent Paced: 98.32 %
Date Time Interrogation Session: 20230601154000
Implantable Lead Implant Date: 19970117
Implantable Lead Implant Date: 19970117
Implantable Lead Location: 753859
Implantable Lead Location: 753860
Implantable Lead Model: 4068
Implantable Lead Model: 4068
Implantable Pulse Generator Implant Date: 20230522
Lead Channel Impedance Value: 342 Ohm
Lead Channel Impedance Value: 418 Ohm
Lead Channel Impedance Value: 475 Ohm
Lead Channel Impedance Value: 494 Ohm
Lead Channel Pacing Threshold Amplitude: 1.5 V
Lead Channel Pacing Threshold Amplitude: 1.75 V
Lead Channel Pacing Threshold Pulse Width: 1 ms
Lead Channel Pacing Threshold Pulse Width: 1 ms
Lead Channel Sensing Intrinsic Amplitude: 1.625 mV
Lead Channel Setting Pacing Amplitude: 2.5 V
Lead Channel Setting Pacing Amplitude: 3.5 V
Lead Channel Setting Pacing Pulse Width: 1.5 ms
Lead Channel Setting Sensing Sensitivity: 5.6 mV

## 2022-04-20 NOTE — Patient Instructions (Signed)
   After Your Pacemaker   Monitor your pacemaker site for redness, swelling, and drainage. Call the device clinic at [phone] if you experience these symptoms or fever/chills.  Your incision was closed with Steri-strips or staples:  You may shower 7 days after your procedure and wash your incision with soap and water. Avoid lotions, ointments, or perfumes over your incision until it is well-healed.  You may use a hot tub or a pool after your wound check appointment if the incision is completely closed.  You may drive, unless driving has been restricted by your healthcare providers.  Your Pacemaker is not MRI compatible.  Remote monitoring is used to monitor your pacemaker from home. This monitoring is scheduled every 91 days by our office. It allows [name] to keep an eye on the functioning of your device to ensure it is working properly. You will routinely see your Electrophysiologist annually (more often if necessary).

## 2022-04-20 NOTE — Progress Notes (Signed)
Wound check appointment. Steri-strips removed. Wound without redness or edema. Incision edges approximated, wound well healed. Normal device function. Thresholds, sensing, and impedances consistent with implant measurements. Device programmed at chronic lead settings. Histogram distribution appropriate for patient and level of activity. No mode switches or high ventricular rates noted. Patient educated about wound care, arm mobility, lifting restrictions. Patient enrolled in remote monitoring with next transmission scheduled 07/12/22. 91 day follow up with Dr. Caryl Comes 07/17/22

## 2022-04-21 ENCOUNTER — Encounter: Payer: Self-pay | Admitting: Internal Medicine

## 2022-04-21 LAB — BASIC METABOLIC PANEL
BUN/Creatinine Ratio: 41 — ABNORMAL HIGH (ref 12–28)
BUN: 37 mg/dL — ABNORMAL HIGH (ref 8–27)
CO2: 22 mmol/L (ref 20–29)
Calcium: 10.2 mg/dL (ref 8.7–10.3)
Chloride: 105 mmol/L (ref 96–106)
Creatinine, Ser: 0.9 mg/dL (ref 0.57–1.00)
Glucose: 123 mg/dL — ABNORMAL HIGH (ref 70–99)
Potassium: 3.7 mmol/L (ref 3.5–5.2)
Sodium: 141 mmol/L (ref 134–144)
eGFR: 63 mL/min/{1.73_m2} (ref >59)

## 2022-04-24 ENCOUNTER — Telehealth: Payer: Self-pay | Admitting: Internal Medicine

## 2022-04-24 NOTE — Telephone Encounter (Signed)
Pharmacy needs clarification on maximum dosage of Astelin - Please advise

## 2022-04-24 NOTE — Telephone Encounter (Signed)
Patient states her bandaid fell off from her recent procedure. She states she thinks it fell off yesterday or the day before, because she did not notice it. She states would feel more comfortable having it looked at.

## 2022-04-25 NOTE — Telephone Encounter (Signed)
Attempted to return phone call. No answer and no VM was available to leave message.

## 2022-04-26 NOTE — Telephone Encounter (Signed)
    Picture received spoke with daughter informed her that incision edges were approximated, no drainage noted informed daughter to have home health CNA look at site when she is out there and make sure site doesn't become red, inflamed or if patient starts running a fever or chills to call office patients daughter appreciative of call back

## 2022-04-26 NOTE — Telephone Encounter (Signed)
Unable to reach patient, also left voicemail on daughter cell phone with direct number to device clinic

## 2022-04-26 NOTE — Telephone Encounter (Signed)
Spoke with daughter who lives in [place] and she is going to have the CNA that goes to check on her mom to send a picture of the wound for [name] to look at before we decide if she needs to come in or not. I have given her my email and direct DC phone number

## 2022-05-03 ENCOUNTER — Encounter: Payer: Self-pay | Admitting: Psychology

## 2022-05-15 ENCOUNTER — Telehealth: Payer: Self-pay | Admitting: Internal Medicine

## 2022-05-25 ENCOUNTER — Encounter: Payer: Self-pay | Admitting: Psychology

## 2022-05-26 ENCOUNTER — Encounter: Payer: Self-pay | Admitting: Internal Medicine

## 2022-05-29 MED ORDER — GABAPENTIN 100 MG PO CAPS: 100.0000 mg | ORAL_CAPSULE | Freq: Three times a day (TID) | ORAL | 1 refills | Status: DC

## 2022-05-29 MED ORDER — LORAZEPAM 0.5 MG PO TABS: 0.5000 mg | ORAL_TABLET | Freq: Two times a day (BID) | ORAL | 2 refills | Status: DC | PRN

## 2022-06-02 ENCOUNTER — Ambulatory Visit: Payer: Medicare HMO | Admitting: Psychology

## 2022-06-02 ENCOUNTER — Ambulatory Visit: Payer: Medicare HMO

## 2022-06-02 ENCOUNTER — Encounter: Payer: Self-pay | Admitting: Psychology

## 2022-06-02 DIAGNOSIS — F02A0 Dementia in other diseases classified elsewhere, mild, without behavioral disturbance, psychotic disturbance, mood disturbance, and anxiety: Secondary | ICD-10-CM

## 2022-06-02 DIAGNOSIS — F411 Generalized anxiety disorder: Secondary | ICD-10-CM | POA: Diagnosis not present

## 2022-06-02 DIAGNOSIS — R4189 Other symptoms and signs involving cognitive functions and awareness: Secondary | ICD-10-CM

## 2022-06-02 DIAGNOSIS — G301 Alzheimer's disease with late onset: Secondary | ICD-10-CM

## 2022-06-02 DIAGNOSIS — F03A0 Unspecified dementia, mild, without behavioral disturbance, psychotic disturbance, mood disturbance, and anxiety: Secondary | ICD-10-CM

## 2022-06-02 DIAGNOSIS — F0390 Unspecified dementia without behavioral disturbance: Secondary | ICD-10-CM | POA: Insufficient documentation

## 2022-06-02 HISTORY — DX: Unspecified dementia, mild, without behavioral disturbance, psychotic disturbance, mood disturbance, and anxiety: F03.A0

## 2022-06-02 NOTE — Progress Notes (Signed)
NEUROPSYCHOLOGICAL EVALUATION [place] [place] Department of Neurology  Date of Evaluation: June 02, 2022  Reason for Referral:   Danielle [name] is a 86 y.o. right-handed Caucasian female referred by  Alric Ran, M.D. , to characterize her current cognitive functioning and assist with diagnostic clarity and treatment planning in the context of subjective cognitive decline.   Assessment and Plan:   Clinical Impression(s): Danielle [name] pattern of performance is suggestive of diffuse and fairly severe cognitive impairment relative to age-matched peers. She performed well across tasks assessing basic attention, receptive language, phonemic fluency, and confrontation naming. However, impaired performances were exhibited across all other assessed cognitive domains. This includes processing speed, complex attention, cognitive flexibility, safety/judgment, semantic fluency, visuospatial abilities, and all aspects of learning and memory. Regarding activities of daily living (ADLs), her daughter fully manages finances and bill paying and also provides assistance with medication management. Danielle [name] also does not drive. The severity of cognitive impairment would best align with a diagnostic classification of a Major Neurocognitive Disorder ("dementia"). However, functionally, she is likely towards the milder end of this spectrum given her daughter's overall summation of her mother being very independent.   It is worth highlighting that Danielle [name] had significant difficulty across a task designed to assess safety and judgment in a variety of scenarios. For example, when asked why one shouldn't leave a young child alone at home, she responded "bad men around to shoot them." When asked why one should replace batteries in smoke detectors regularly, she exhibited confusion and stated "I don't know, why would you?" When asked to define what it meant by a medication having a 25%  chance of having negative side effects, she responded "change doctors, get a second opinion." Based upon these responses, it may be prudent for her daughter and other family members to actively monitor safety behaviors in her day-to-day life.   The overall cause of ongoing cognitive impairment is somewhat unclear. However, I do have concerns surrounding a late-onset presentation of Alzheimer's disease. Across memory tasks, she did not benefit from repeated exposure to information, was fully amnestic (i.e., 0% retention) across all administered memory tasks, and performed poorly across recognition trials. Taken together, this suggests rapid forgetting and a significant memory storage impairment, both of which are the hallmark memory characteristics of this illness. Impairments in semantic fluency (relative to adequate phonemic fluency performance) and visuospatial abilities also aligns well with this condition. She did perform well across confrontation naming tasks. However, this illness appears to fit her clinical presentation best at the present time.   She did not report behavorial symptoms concerning for Lewy body dementia or frontotemporal dementia. Past neuroimaging (head CT) did not reveal significant cerebrovascular abnormalities, making a vascular dementia presentation unlikely. She reported minimal to no symptoms of anxiety, depression, or other psychiatric distress across mood-related questionnaires. Despite her grief and bereavement experiences starting in December 2021, psychiatric distress unfortunately cannot explain fully amnestic memory performances. Continued medical monitoring will be important moving forward.   Recommendations: Danielle [name] should discuss medication options to address memory loss and concerns surrounding Alzheimer's disease with her neurologist. It is important to highlight that these medications have been shown to slow functional decline in some individuals. There is no  current treatment which can stop or reverse cognitive decline when caused by a neurodegenerative illness.   As it appears that she may be moving to [place], I would recommend that she establish care with a neurologist in her  area once she is settled.   I would recommend that Danielle [name] continue to fully abstain from driving pursuits based upon current testing.   It will be important for her to have another person with her when in situations where she may need to process information, weigh the pros and cons of different options, and make decisions, in order to ensure that she fully understands and recalls all information to be considered.  If not already done, she and her family may want to discuss her wishes regarding durable power of attorney and medical decision making, so that she can have input into these choices. Additionally, they may wish to discuss future plans for caretaking and seek out community options for in home/residential care should they become necessary.  Danielle [name] is encouraged to attend to lifestyle factors for brain health (e.g., regular physical exercise, good nutrition habits, regular participation in cognitively-stimulating activities, and general stress management techniques), which are likely to have benefits for both emotional adjustment and cognition. In fact, in addition to promoting good general health, regular exercise incorporating aerobic activities (e.g., brisk walking, jogging, cycling, etc.) has been demonstrated to be a very effective treatment for depression and stress, with similar efficacy rates to both antidepressant medication and psychotherapy. Optimal control of vascular risk factors (including safe cardiovascular exercise and adherence to dietary recommendations) is encouraged. Continued participation in activities which provide mental stimulation and social interaction is also recommended.   Important information should be provided to Danielle [name] in written  format in all instances. This information should be placed in a highly frequented and easily visible location within her home to promote recall. External strategies such as written notes in a consistently used memory journal, visual and nonverbal auditory cues such as a calendar on the refrigerator or appointments with alarm, such as on a cell phone, can also help maximize recall.  Review of Records:   Danielle [name] was seen by [facility] Alric Ran, M.D.) on 11/30/2021 for an evaluation of memory decline and behavioral changes. Briefly, Danielle [name] noted that her husband passed away in 11-Dec-2020. She experienced significant grief and described a period of time where she could not remember anything. 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 Her daughter noted that, after her husband's passing, Danielle [name] lost a lot of weight, acted strangely, and developed some gait difficulties. There was also report of paranoia in that she believed someone was in her closet and that her daughter was trying to kidnap her. She was taken to the hospital and started on Seroquel. For the month prior to meeting with Dr. April Manson, she and her daughter reported gradual improvement in overall symptoms. Short-term memory dysfunction persisted, with examples surrounding Danielle [name] asking repetitive questions and forgetting previous conversations. She was largely independent regarding ADLs at that time. Performance on a brief cognitive screening instrument (MMSE) was 21/30. Ultimately, Danielle [name] was referred for a comprehensive neuropsychological evaluation to characterize her cognitive abilities and to assist with diagnostic clarity and treatment planning.   Head CT on 10/23/2021 revealed mild atrophy and chronic microvascular ischemic changes. No other imaging was available for review. She has an  implanted pacemaker which may prevent additional forms of imaging to be conducted.   Past Medical History:  Diagnosis Date   Acute bronchitis 10/20/2008   Allergic rhinitis 01/09/2008   Alopecia 01/21/2015  Arthritis 06/11/2020   Atrioventricular block, complete 12/16/2009   Blood in urine 02/22/2017   Cardiac pacemaker in situ 01/23/2012   Cervical radiculopathy 07/11/2013   Complete atrioventricular block 12/16/2009   Degenerative joint disease of right hip 12/17/2018   Dizziness 08/03/2012   Essential hypertension 01/09/2008   Gait disorder 08/27/2021   Gastroesophageal reflux disease 01/09/2008   Generalized anxiety disorder 01/09/2008   Genetic testing 06/04/2017   Danielle [name] underwent genetic counseling and testing for hereditary cancer syndromes on 05/24/2017. Her results were negative for mutations in all 46 genes analyzed by [facility]'s 46-gene Common Hereditary Cancers Panel. Genes analyzed include: APC, ATM, AXIN2, BARD1, BMPR1A, BRCA1, BRCA2, BRIP1, CDH1, CDKN2A, CHEK2, CTNNA1, DICER1, EPCAM, GREM1, HOXB13, KIT, MEN1, MLH1, MSH2, MSH3, MSH6, MUTYH, NBN   History of right mastectomy 01/11/2017   History of thromboembolism of vein 01/09/2008   History of total hip arthroplasty 04/28/2019   Hyperbilirubinemia 12/16/2021   Hyperlipidemia 01/09/2008   Hypoproteinemia 12/16/2021   Hypothyroidism 01/09/2008   Impaired glucose tolerance 07/28/2011   Insomnia 06/15/2009   Left leg swelling 01/21/2015   Low back pain 04/21/2021   Major depressive disorder 01/09/2008   Malignant neoplasm of upper-inner quadrant of female breast 10/06/2016   Neck pain 11/04/2019   Normocytic anemia 01/09/2008   Olecranon bursitis of left elbow 05/05/2015   Osteoarthritis cervical spine 02/25/2010   Pacemaker-medtronic 01/23/2012   DOI 1990s Extraction 1997 Generator replacement 2006, 2013   Peripheral venous insufficiency 03/15/2017   Personal history of venous thrombosis and embolism  01/09/2008   Renal calculus, right 02/09/2020   Right hip pain 07/18/2016   Spondylosis 02/25/2010   Status post total hip replacement, right 11/04/2019   Subungual hematoma of foot 03/15/2017   Trigger finger of left hand 07/14/2020   Trigger finger of right hand 07/14/2020   Urinary incontinence 10/09/2019   Vitamin D deficiency 03/05/2022    Past Surgical History:  Procedure Laterality Date   BREAST BIOPSY Right 09/20/2016   malignant   ERCP W/ SPHICTEROTOMY     pt denies   HEMORRHOID SURGERY     pt denies   IR URETERAL STENT RIGHT NEW ACCESS W/O SEP NEPHROSTOMY CATH  02/09/2020   MASTECTOMY Right 01/11/2017   MASTECTOMY W/ SENTINEL NODE BIOPSY Right 01/11/2017   Procedure: RIGHT MASTECTOMY WITH SENTINEL LYMPH NODE BIOPSY;  Surgeon: [name] Klein, MD;  Location: [facility] OR;  Service: General;  Laterality: Right;   NEPHROLITHOTOMY Right 02/09/2020   Procedure: NEPHROLITHOTOMY PERCUTANEOUS;  Surgeon: Kathie Rhodes, MD;  Location: [facility] ORS;  Service: Urology;  Laterality: Right;   PACEMAKER PLACEMENT Left    MedTronic EnPulse [id number] no heartbeat without pacemaker   PERMANENT PACEMAKER GENERATOR CHANGE N/A 07/16/2012   Procedure: PERMANENT PACEMAKER GENERATOR CHANGE;  Surgeon: Deboraha Sprang, MD;  Location: [facility] CATH LAB;  Service: Cardiovascular;  Laterality: N/A;   POLYPECTOMY     Uterine   PPM GENERATOR CHANGEOUT N/A 04/10/2022   Procedure: PPM GENERATOR CHANGEOUT;  Surgeon: Deboraha Sprang, MD;  Location: [place] [facility] CV LAB;  Service: Cardiovascular;  Laterality: N/A;   TOTAL HIP ARTHROPLASTY Right 04/28/2019   Procedure: RIGHT TOTAL HIP ARTHROPLASTY ANTERIOR APPROACH;  Surgeon: Leandrew Koyanagi, MD;  Location: [place];  Service: Orthopedics;  Laterality: Right;    Current Outpatient Medications:    acetaminophen (TYLENOL) 500 MG tablet, Take 500-1,000 mg by mouth every 6 (six) hours as needed for mild pain., Disp: , Rfl:    alendronate (FOSAMAX) 70 MG tablet, Take  1 tablet (70 mg total) by  mouth every 7 (seven) days. Take with a full glass of water on an empty stomach., Disp: 12 tablet, Rfl: 3   atenolol (TENORMIN) 50 MG tablet, Take 1 tablet (50 mg total) by mouth daily., Disp: 90 tablet, Rfl: 3   atorvastatin (LIPITOR) 10 MG tablet, TAKE 1 TABLET(10 MG) BY MOUTH DAILY, Disp: 90 tablet, Rfl: 3   azelastine (ASTELIN) 0.1 % nasal spray, Place 1 spray into both nostrils as needed (Summer Allergies)., Disp: 30 mL, Rfl: 11   enalapril (VASOTEC) 10 MG tablet, Take 1 tablet (10 mg total) by mouth daily., Disp: 90 tablet, Rfl: 3   gabapentin (NEURONTIN) 100 MG capsule, Take 1 capsule (100 mg total) by mouth 3 (three) times daily., Disp: 270 capsule, Rfl: 1   levothyroxine (SYNTHROID) 112 MCG tablet, Take 1 tablet (112 mcg total) by mouth daily before breakfast., Disp: 90 tablet, Rfl: 3   LORazepam (ATIVAN) 0.5 MG tablet, Take 1 tablet (0.5 mg total) by mouth 2 (two) times daily as needed for anxiety., Disp: 60 tablet, Rfl: 2   Multiple Minerals-Vitamins (CAL-MAG-ZINC-D PO), Take 1 tablet by mouth in the morning and at bedtime., Disp: , Rfl:    QUEtiapine (SEROQUEL) 25 MG tablet, Take 1 tablet (25 mg total) by mouth 3 (three) times daily., Disp: 270 tablet, Rfl: 1  Clinical Interview:   The following information was obtained during a clinical interview with Danielle [name] prior to cognitive testing.  Cognitive Symptoms: Decreased short-term memory: Denied. Danielle [name] stated that she has "no concern" regarding memory abilities. She did state that her memory is not what it used to be, which she attributed to age-related changes. Her daughter reported that she will hide things. This is longstanding in nature. However, lately, she has been unable to locate these objects and things stay lost. She also noted Danielle [name] having a harder time recalling important dates and upcoming appointments. Decreased long-term memory: Denied. Decreased attention/concentration: Endorsed. She reported trouble with  sustained attention and distractibility. At least some of this was attributed to interest as she stated she does not pay attention to things that do not interest her.  Reduced processing speed: Endorsed. Difficulties with executive functions: Endorsed. She reported some trouble with multi-tasking and organization. Trouble with impulsivity or any significant personality changes over the past few years were denied.  Difficulties with emotion regulation: Denied. Difficulties with receptive language: Denied. Difficulties with word finding: Denied. Decreased visuoperceptual ability: Denied.  Trajectory of deficits: Danielle [name] husband reportedly passed away in 2020-12-02. After this, Danielle [name] experienced significant symptoms of depression, anxiety, and overall psychiatric distress. Her daughter descried Ms. Turan's mental status during this period as her "going on a vacation" which led to an emerging "health crisis." Cognitive dysfunction was first observed in this context and denied prior to the passing of her husband. Over time and with physician and familial assistance, symptoms were noted to gradually improve.   Difficulties completing ADLs: Somewhat. Her daughter manages finances and bill paying pursuits. Per both of them, this is largely due to Ms. Larouche being resistant to any technological processes. Her daughter did state that Ms. Olexa could possibly perform these actions if they were "old school" surrounding writing checks and handling paper money more frequently. Ms. Pirie manages her medications but does receive some assistance from her daughter. Her daughter has wondered about pre-packaged PillPacks for convenience. Ms. Brocksmith appeared to believe that this would introduce technology into her routine and thus  expressed opposition. She no longer drives, stating that this was her choice and has been ongoing for a while. Outside of this, her daughter described Ms. 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   Additional Medical History: History of traumatic brain injury/concussion: Denied. History of stroke: Denied. History of seizure activity: Denied. History of known exposure to toxins: Denied. Symptoms of chronic pain: Endorsed. She reported some neuropathy and potential sciatica in her left leg. Medical records also suggest prior pain surrounding her hips, neck, and lower back.  Experience of frequent headaches/migraines: Denied. However, more recently, she did describe some bitemporal pain. It was unclear based on her reporting if this was surface level pain or actual headache symptoms.  Frequent instances of dizziness/vertigo: Denied.  Sensory changes: She is able to see adequately but did describe some floaters and "waves" in her left eye from time to time. She acknowledged hearing loss but has not been prescribed hearing aids. Other sensory changes/difficulties (e.g., taste or smell) were denied.  Balance/coordination difficulties: Endorsed. Mild balance instability was attributed to neuropathy and potential sciatica in her left leg. She denied any falls within the past three months. 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  Other motor difficulties: Denied.  Sleep History: Estimated hours obtained each night: 7 hours. Difficulties falling asleep: Denied. Medical records do suggest a history of insomnia.  Difficulties staying asleep: Denied. Feels rested and refreshed upon awakening: Endorsed.  History of snoring: Denied. History of waking up gasping for air: Denied. Witnessed breath cessation while asleep: Denied.  History of vivid dreaming: Endorsed. Excessive movement while asleep: Denied. Instances of acting out her dreams: Denied.  Psychiatric/Behavioral Health History: Depression: Endorsed. Per medical records, depressive symptoms  were present prior to her husband's passing in December 2021. However, these symptoms appear to have been drastically exacerbated after that event (see above). She described her current mood as "not very good because everybody thinks that I'm insane." Current or remote suicidal ideation, intent, or plan was denied.  Anxiety: Endorsed. Per her daughter, generalized anxiety symptoms are longstanding in nature. These have also seemed especially prominent during the past several years. Her daughter also reported a long history of social anxiety.  Mania: Denied. Trauma History: Denied. Visual/auditory hallucinations: Endorsed. Ms. Navedo described potential auditory hallucinations in that she will hear music and hymns playing. At one point, she stated finding out that this music was coming from her neighbors. However, complete confirmation of this remained unclear. Medications and diet appear to improve these symptoms. No visual hallucinations were reported.  Delusional thoughts: Denied.  Tobacco: Denied. Alcohol: She reported rare alcohol consumption currently and denied a history of problematic alcohol abuse or dependence.  Recreational drugs: Denied.  Family History: Problem Relation Age of Onset   Sudden death Mother        d.56s   Breast cancer Mother 46   Pulmonary embolism Father        Possible   Parkinson's disease Brother    Schizophrenia Brother    Lung cancer Paternal Grandfather        history of smoking   Breast cancer Maternal Aunt        d.78s   This information was confirmed by Danielle [name].  Academic/Vocational History: Highest level of educational attainment: 16 years. 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 She described herself as a good (A/B) student in academic settings. No relative weaknesses were identified.  History of developmental delay: Denied. History of grade repetition:  Denied. Enrollment in special education courses: Denied. History of LD/ADHD: Denied.  Employment: Retired. 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  Evaluation Results:   Behavioral Observations: Ms. Kabler was accompanied by her daughter, arrived to her appointment on time, and was appropriately dressed and groomed. She appeared alert and oriented. Observed gait and station were within normal limits. Gross motor functioning appeared intact upon informal observation and no abnormal movements (e.g., tremors) were noted. Her affect was generally relaxed and positive, but did range appropriately given the subject being discussed during the clinical interview or the task at hand during testing procedures. Spontaneous speech was fluent and word finding difficulties were not observed during the clinical interview. Thought processes were coherent, organized, and normal in content. Insight into her cognitive difficulties appeared limited and I do believe she is able to appreciate the extent of ongoing cognitive impairment.   During testing, tolerance was variable and there were several times where she threatened to discontinue the evaluation entirely. The psychometrist was generally able to provide encouragement which assisted in her continuing. However, a few tasks were ultimately not attempted due to limited testing tolerance. Other tasks were discontinued early as Ms. Ruppe commonly stated "I'm done" and refused to persist. Sustained attention was adequate. She reported auditory hallucinations in the form of hearing music throughout the evaluation. Overall, Ms. Moscoso was cooperative with the clinical interview and subsequent testing procedures.   Adequacy of Effort: The validity of neuropsychological testing is limited by the extent to which the individual being tested may be assumed to have exerted adequate effort during testing. Ms. Beharry expressed her  intention to perform to the best of her abilities. Scores across stand-alone and embedded performance validity measures were variable but generally within expectation. Her sole below expectation performance is believed to be due to true, significant memory impairment rather than poor engagement or attempts to perform poorly. As such, the results of the current evaluation are believed to be a generally valid representation of Ms. Santy's current cognitive functioning.  Test Results: Ms. Waggoner was poorly oriented at the time of the current evaluation. The name she provided did not match what is in current medical records "Armstrong" and it was unknown if this represented a maiden name. She incorrectly stated her age ("78") and was unable to provide her phone number or an associated area code. She was also unable to state the current month, date, day of the week, or time.   Intellectual abilities based upon educational and vocational attainment were estimated to be in the average range. Premorbid abilities were estimated to be within the above average range based upon a single-word reading test.   Processing speed was exceptionally low. Basic attention was average to well above average. More complex attention (e.g., working memory) was variable, ranging from the exceptionally low to average normative ranges. Cognitive flexibility was exceptionally low. She also performed in the well below average range across a task assessing safety and judgment.   Assessed receptive language abilities were average. Despite this, Ms. Manninen did exhibit fairly significant comprehension difficulties surrounding task instructions and required frequent repetition and clarification. She was able to answer questions during interview well. Assessed expressive language was variable. Phonemic fluency was average, semantic fluency was exceptionally low,  and confrontation naming was average to above average.    Assessed visuospatial/visuoconstructional abilities were exceptionally low. For her drawing of a clock, her outer circle was appropriate. When placing numbers, she started with the number 8 where 12 should be. From there, she provided numbers 8-20, ending where 6 would normally be on the right-half of the clock. After this, she restarted with the number 1 and placed 1-7 on the left side of the clock. Hands were also placed incorrectly. Her copy of a complex figure was noteworthy for significant visual distortions as well as some aspects being omitted entirely.     Learning (i.e., encoding) of novel verbal information was exceptionally low to well below average. Spontaneous delayed recall (i.e., retrieval) of previously learned information was exceptionally low. Retention rates were 0% across a story learning task, 0% across a list learning task, and 0% across a shape learning task. Performance across a list-based recognition task was also exceptionally low, suggesting negligible evidence for information consolidation.   Results of emotional screening instruments suggested that recent symptoms of generalized anxiety were in the minimal range, while symptoms of depression were within normal limits. A screening instrument assessing recent sleep quality suggested the presence of minimal sleep dysfunction.  Tables of Scores:   Note: This summary of test scores accompanies the interpretive report and should not be considered in isolation without reference to the appropriate sections in the text. Descriptors are based on appropriate normative data and may be adjusted based on clinical judgment. Terms such as "Within Normal Limits" and "Outside Normal Limits" are used when a more specific description of the test score cannot be determined.       Percentile - Normative Descriptor > 98 - Exceptionally High 91-97 - Well Above Average 75-90 - Above Average 25-74 - Average 9-24 - Below Average 2-8 - Well Below  Average < 2 - Exceptionally Low       Validity:   DESCRIPTOR       Dot Counting Test: --- --- Within Normal Limits  RBANS Effort Index: --- --- Outside Normal Limits  WAIS-IV Reliable Digit Span: --- --- Within Normal Limits       Orientation:      Raw Score Percentile   NAB Orientation, Form 1 16/29 --- ---       Cognitive Screening:      Raw Score Percentile   SLUMS: 9/30 --- ---       RBANS, Form A: Standard Score/ Scaled Score Percentile   Total Score 55 <1 Exceptionally Low  Immediate Memory 57 <1 Exceptionally Low    List Learning 2 <1 Exceptionally Low    Story Memory 4 2 Well Below Average  Visuospatial/Constructional 58 <1 Exceptionally Low    Figure Copy 2 <1 Exceptionally Low    Line Orientation 7/20 <2 Exceptionally Low  Language 83 13 Below Average    Picture Naming 10/10 >75 Above Average    Semantic Fluency 3 1 Exceptionally Low  Attention 85 16 Below Average    Digit Span 14 91 Well Above Average    Coding 1 <1 Exceptionally Low  Delayed Memory 40 <1 Exceptionally Low    List Recall 0/10 <2 Exceptionally Low    List Recognition 11/20 <2 Exceptionally Low    Story Recall 1 <1 Exceptionally Low    Figure Recall 1 <1 Exceptionally Low        Intellectual Functioning:      Standard Score Percentile   Test of Premorbid Functioning: 114  82 Above Average       Attention/Executive Function:     Trail Making Test (TMT): Raw Score (T Score) Percentile     Part A 179 secs.,  0 errors (20) <1 Exceptionally Low    Part B Discontinued --- Impaired         Scaled Score Percentile   WAIS-IV Digit Span: 8 25 Average    Forward 11 63 Average    Backward 10 50 Average    Sequencing 3 1 Exceptionally Low       NAB Executive Functions Module, Form 1: T Score Percentile     Judgment 32 4 Well Below Average       Language:      Raw Score Percentile   Sentence Repetition: 13/22 11 Below Average       Verbal Fluency Test: Raw Score (Scaled Score) Percentile      Phonemic Fluency (CFL) 26 (9) 37 Average    Category Fluency 10 (2) <1 Exceptionally Low  *Based on [employer] Older Normative Studies (MOANS)          NAB Language Module, Form 1: T Score Percentile     Auditory Comprehension 43 25 Average    Naming 28/31 (44) 27 Average       Visuospatial/Visuoconstruction:      Raw Score Percentile   Clock Drawing: 4/10 --- Impaired        Scaled Score Percentile   WAIS-IV Block Design: Discontinued --- Impaired       Mood and Personality:      Raw Score Percentile   Geriatric Depression Scale: 4 --- Within Normal Limits  Geriatric Anxiety Scale: 5 --- Minimal    Somatic 3 --- Minimal    Cognitive 2 --- Minimal    Affective 0 --- Minimal       Additional Questionnaires:      Raw Score Percentile   PROMIS Sleep Disturbance Questionnaire: 10 --- None to Slight   Informed Consent and Coding/Compliance:   The current evaluation represents a clinical evaluation for the purposes previously outlined by the referral source and is in no way reflective of a forensic evaluation.   Ms. Stopper was provided with a verbal description of the nature and purpose of the present neuropsychological evaluation. Also reviewed were the foreseeable risks and/or discomforts and benefits of the procedure, limits of confidentiality, and mandatory reporting requirements of this provider. The patient was given the opportunity to ask questions and receive answers about the evaluation. Oral consent to participate was provided by the patient.   This evaluation was conducted by Christia Reading, Ph.D., ABPP-CN, board certified clinical neuropsychologist. Ms. Mullinax completed a clinical interview with Dr. Melvyn Novas, billed as one unit [phone], and 135 minutes of cognitive testing and scoring, billed as one unit [phone] and four additional units [id number]. Psychometrist Cruzita Lederer, B.S., assisted Dr. Melvyn Novas with test administration and scoring procedures. As a separate and discrete service, Dr.  Melvyn Novas spent a total of 160 minutes in interpretation and report writing billed as one unit [phone] and two units 96133.

## 2022-06-02 NOTE — Progress Notes (Signed)
   Psychometrician Note   Cognitive testing was administered to Danielle [name] by Cruzita Lederer, B.S. (psychometrist) under the supervision of Dr. Christia Reading, Ph.D., licensed psychologist on 06/02/2022. Ms. Towers did not appear overtly distressed by the testing session per behavioral observation or responses across self-report questionnaires. Rest breaks were offered.    The battery of tests administered was selected by Dr. Christia Reading, Ph.D. with consideration to Ms. Lehrke's current level of functioning, the nature of her symptoms, emotional and behavioral responses during interview, level of literacy, observed level of motivation/effort, and the nature of the referral question. This battery was communicated to the psychometrist. Communication between Dr. Christia Reading, Ph.D. and the psychometrist was ongoing throughout the evaluation and Dr. Christia Reading, Ph.D. was immediately accessible at all times. Dr. Christia Reading, Ph.D. provided supervision to the psychometrist on the date of this service to the extent necessary to assure the quality of all services provided.    YVETTE LOVELESS will return within approximately 1-2 weeks for an interactive feedback session with Dr. Melvyn Novas at which time her test performances, clinical impressions, and treatment recommendations will be reviewed in detail. Ms. Milliron understands she can contact our office should she require our assistance before this time.  A total of 135 minutes of billable time were spent face-to-face with Ms. Ro[place]horse by the psychometrist. This includes both test administration and scoring time. Billing for these services is reflected in the clinical report generated by Dr. Christia Reading, Ph.D.  This note reflects time spent with the psychometrician and does not include test scores or any clinical interpretations made by Dr. Melvyn Novas. The full report will follow in a separate note.

## 2022-06-06 ENCOUNTER — Encounter: Payer: Medicare HMO | Admitting: Psychology

## 2022-06-13 ENCOUNTER — Ambulatory Visit (INDEPENDENT_AMBULATORY_CARE_PROVIDER_SITE_OTHER): Payer: Medicare HMO | Admitting: Psychology

## 2022-06-13 DIAGNOSIS — F02A0 Dementia in other diseases classified elsewhere, mild, without behavioral disturbance, psychotic disturbance, mood disturbance, and anxiety: Secondary | ICD-10-CM

## 2022-06-13 DIAGNOSIS — G301 Alzheimer's disease with late onset: Secondary | ICD-10-CM

## 2022-06-13 NOTE — Progress Notes (Signed)
   Neuropsychology Note [name] Department of Neurology     Danielle [name]'s daughter called stating that her mother was feeling ill and running a fever. As such, her feedback appointment will be re-scheduled for 06/20/2022 at 10:00am. Her daughter requested that this be a telephone call rather than an in-person visit.

## 2022-06-15 ENCOUNTER — Ambulatory Visit: Payer: Medicare HMO | Admitting: Neurology

## 2022-06-15 ENCOUNTER — Encounter: Payer: Self-pay | Admitting: Neurology

## 2022-06-15 VITALS — BP 175/84 | HR 68 | Wt 113.0 lb

## 2022-06-15 DIAGNOSIS — G301 Alzheimer's disease with late onset: Secondary | ICD-10-CM | POA: Diagnosis not present

## 2022-06-15 DIAGNOSIS — Z634 Disappearance and death of family member: Secondary | ICD-10-CM | POA: Diagnosis not present

## 2022-06-15 DIAGNOSIS — F02A0 Dementia in other diseases classified elsewhere, mild, without behavioral disturbance, psychotic disturbance, mood disturbance, and anxiety: Secondary | ICD-10-CM | POA: Diagnosis not present

## 2022-06-15 MED ORDER — ATORVASTATIN CALCIUM 40 MG PO TABS: 40.0000 mg | ORAL_TABLET | Freq: Every day | ORAL | 0 refills | Status: DC

## 2022-06-15 MED ORDER — ATORVASTATIN CALCIUM 40 MG PO TABS: ORAL_TABLET | ORAL | 3 refills | Status: DC

## 2022-06-15 MED ORDER — DONEPEZIL HCL 10 MG PO TABS: 10.0000 mg | ORAL_TABLET | Freq: Every day | ORAL | 3 refills | Status: DC

## 2022-06-15 MED ORDER — DONEPEZIL HCL 5 MG PO TABS: 5.0000 mg | ORAL_TABLET | Freq: Every day | ORAL | 0 refills | Status: DC

## 2022-06-15 NOTE — Patient Instructions (Signed)
Start with Aricept 5 mg nightly for 1 month then increase to 10 mg nightly Continue other medications Set up care with a neurologist once you moved to [place] with your move to [place], please do not hesitate to contact us for any questions or concerns.    There are well-accepted and sensible ways to reduce risk for Alzheimers disease and other degenerative brain disorders .  Exercise Daily Walk A daily 20 minute walk should be part of your routine. Disease related apathy can be a significant roadblock to exercise and the only way to overcome this is to make it a daily routine and perhaps have a reward at the end (something your loved one loves to eat or drink perhaps) or a personal trainer coming to the home can also be very useful. Most importantly, the patient is much more likely to exercise if the caregiver / spouse does it with him/her. In general a structured, repetitive schedule is best.  General Health: Any diseases which effect your body will effect your brain such as a pneumonia, urinary infection, blood clot, heart attack or stroke. Keep contact with your primary care doctor for regular follow ups.  Sleep. A good nights sleep is healthy for the brain. Seven hours is recommended. If you have insomnia or poor sleep habits we can give you some instructions. If you have sleep apnea wear your mask.  Diet: Eating a heart healthy diet is also a good idea; fish and poultry instead of red meat, nuts (mostly non-peanuts), vegetables, fruits, olive oil or canola oil (instead of butter), minimal salt (use other spices to flavor foods), whole grain rice, bread, cereal and pasta and wine in moderation.Research is now showing that the MIND diet, which is a combination of The Mediterranean diet and the DASH diet, is beneficial for cognitive processing and longevity. Information about this diet can be found in The MIND Diet, a book by Doyne Keel, MS, RDN, and online at  [employer]si  Finances, Power of Attorney and Advance Directives: You should consider putting legal safeguards in place with regard to financial and medical decision making. While the spouse always has power of attorney for medical and financial issues in the absence of any form, you should consider what you want in case the spouse / caregiver is no longer around or capable of making decisions.

## 2022-06-15 NOTE — Progress Notes (Signed)
[facility]  PATIENT: Danielle [name] DOB: 1936/05/21  REQUESTING CLINICIAN: Biagio Borg, MD HISTORY FROM: Patient  REASON FOR VISIT: Cognitive and behavioral problem   HISTORICAL  CHIEF COMPLAINT:  Chief Complaint  Patient presents with   Follow-up    Rm 13, alone  discuss neuro eval results    INTERVAL HISTORY 06/15/2022:  [name] presents today for follow-up, she is accompanied by her daughter. She had her neuropsych evaluation completed on July 14.  The results are consistent with mild Alzheimer disease.  She has not seen Dr. Melvyn Novas yet for feedback, they have an appointment with him on August 1.  She reports planning to move in the next couple months back to [place] where she will be close to her daughter.  There will most likely set up care with a neurologist there. Currently she still lives alone, but her daughter who visits her multiple times per week, she does not drive, daughter he is paying all of her bills.  She is able to cook, denies leaving the stove on. She is still independent to some degree.    Neuropsych clinical impression  [name]'s pattern of performance is suggestive of diffuse and fairly severe cognitive impairment relative to age-matched peers. She performed well across tasks assessing basic attention, receptive language, phonemic fluency, and confrontation naming. However, impaired performances were exhibited across all other assessed cognitive domains. This includes processing speed, complex attention, cognitive flexibility, safety/judgment, semantic fluency, visuospatial abilities, and all aspects of learning and memory. Regarding activities of daily living (ADLs), her daughter fully manages finances and bill paying and also provides assistance with medication management. [name] also does not drive. The severity of cognitive impairment would best align with a diagnostic classification of a Major Neurocognitive Disorder  ("dementia"). However, functionally, she is likely towards the milder end of this spectrum given her daughter's overall summation of her mother being very independent.   HISTORY OF PRESENT ILLNESS:  This is a 86 year old woman with past medical history of hypothyroidism, hypertension, hyperlipidemia who is presenting with memory decline and behavioral changes for the past year after death of husband.  Patient reported that her husband died on 16-Dec-2020, and he has been going through grief.  Per patient, there was a period of time where she could not remember anything.  She reported that her daughter has told her that she was acting strange but patient does not remember it, and there was also report that she did not believe that her husband died but later recognized that he died and she was comfortable with it.  She tells me that she believe that she had this reaction because she was frightened, she said that her husband was a protector and it was very scared to know that she is living alone in a [place] farm alone and does not have any protection. Her daughter was present during this interview and was able to give additional insight of her mother's condition.  She reported that after the death of their father her mom was going to grief, she had lost a lot of weight, and this past summer she could not take care of her old horses, she had to put 2 of them down and that was very traumatic for her because she loves her horses.  On December 2 they had to call the ambulance because she was not doing well, she could not walk and she was acting strange.  There was report that  she believed that somebody was in her closet.  She was noted to be a little paranoid thinking that her daughter was trying to kidnap her and she had tried to escape from her home.  She was taken to the hospital, she was started on Seroquel which was gradually increased up to 3 times daily. For the past month they report improvement of  her symptoms, she still forgetful, has to ask the same question multiple times, for instance daughter reports that for today's appointment she had to tell her four time but she had a neurology appointment today.  Other than that she is independent, she currently she does not drive but she is able to cook, clean, bathe and take care of her own self.  Her daughter is living with her and her family his helping her with her bills because back in October she had not paid her bills and was having issue with her bank and a creditors.  She remains very active in a farm.   OTHER MEDICAL CONDITIONS: Hypothyroid, cognitive impairment, HTN, HLD   REVIEW OF SYSTEMS: Full 14 system review of systems performed and negative with exception of: as noted in the HPI  ALLERGIES: Allergies  Allergen Reactions   Codeine Nausea And Vomiting    Pt is unaware of reaction    Oxycodone Nausea And Vomiting    Pt is unaware of reaction     HOME MEDICATIONS: Outpatient Medications Prior to Visit  Medication Sig Dispense Refill   acetaminophen (TYLENOL) 500 MG tablet Take 500-1,000 mg by mouth every 6 (six) hours as needed for mild pain.     alendronate (FOSAMAX) 70 MG tablet Take 1 tablet (70 mg total) by mouth every 7 (seven) days. Take with a full glass of water on an empty stomach. 12 tablet 3   atenolol (TENORMIN) 50 MG tablet Take 1 tablet (50 mg total) by mouth daily. 90 tablet 3   azelastine (ASTELIN) 0.1 % nasal spray Place 1 spray into both nostrils as needed (Summer Allergies). 30 mL 11   enalapril (VASOTEC) 10 MG tablet Take 1 tablet (10 mg total) by mouth daily. 90 tablet 3   gabapentin (NEURONTIN) 100 MG capsule Take 1 capsule (100 mg total) by mouth 3 (three) times daily. 270 capsule 1   levothyroxine (SYNTHROID) 112 MCG tablet Take 1 tablet (112 mcg total) by mouth daily before breakfast. 90 tablet 3   LORazepam (ATIVAN) 0.5 MG tablet Take 1 tablet (0.5 mg total) by mouth 2 (two) times daily as needed for  anxiety. 60 tablet 2   Multiple Minerals-Vitamins (CAL-MAG-ZINC-D PO) Take 1 tablet by mouth in the morning and at bedtime.     QUEtiapine (SEROQUEL) 25 MG tablet Take 1 tablet (25 mg total) by mouth 3 (three) times daily. 270 tablet 1   atorvastatin (LIPITOR) 10 MG tablet TAKE 1 TABLET(10 MG) BY MOUTH DAILY 90 tablet 3   No facility-administered medications prior to visit.    PAST MEDICAL HISTORY: Past Medical History:  Diagnosis Date   Acute bronchitis 10/20/2008   Allergic rhinitis 01/09/2008   Alopecia 01/21/2015   Arthritis 06/11/2020   Atrioventricular block, complete 12/16/2009   Blood in urine 02/22/2017   Cardiac pacemaker in situ 01/23/2012   Cervical radiculopathy 07/11/2013   Complete atrioventricular block 12/16/2009   Degenerative joint disease of right hip 12/17/2018   Dizziness 08/03/2012   Essential hypertension 01/09/2008   Gait disorder 08/27/2021   Gastroesophageal reflux disease 01/09/2008   Generalized anxiety disorder  01/09/2008   Genetic testing 06/04/2017   Ms. Bauer underwent genetic counseling and testing for hereditary cancer syndromes on 05/24/2017. Her results were negative for mutations in all 46 genes analyzed by [facility]'s 46-gene Common Hereditary Cancers Panel. Genes analyzed include: APC, ATM, AXIN2, BARD1, BMPR1A, BRCA1, BRCA2, BRIP1, CDH1, CDKN2A, CHEK2, CTNNA1, DICER1, EPCAM, GREM1, HOXB13, KIT, MEN1, MLH1, MSH2, MSH3, MSH6, MUTYH, NBN   History of right mastectomy 01/11/2017   History of thromboembolism of vein 01/09/2008   History of total hip arthroplasty 04/28/2019   Hyperbilirubinemia 12/16/2021   Hyperlipidemia 01/09/2008   Hypoproteinemia 12/16/2021   Hypothyroidism 01/09/2008   Impaired glucose tolerance 07/28/2011   Insomnia 06/15/2009   Left leg swelling 01/21/2015   Low back pain 04/21/2021   Major depressive disorder 01/09/2008   Malignant neoplasm of upper-inner quadrant of female breast 10/06/2016   Mild dementia 06/02/2022    Neck pain 11/04/2019   Normocytic anemia 01/09/2008   Olecranon bursitis of left elbow 05/05/2015   Osteoarthritis cervical spine 02/25/2010   Pacemaker-medtronic 01/23/2012   DOI 1990s Extraction 1997 Generator replacement 2006, 2013   Peripheral venous insufficiency 03/15/2017   Personal history of venous thrombosis and embolism 01/09/2008   Renal calculus, right 02/09/2020   Right hip pain 07/18/2016   Spondylosis 02/25/2010   Status post total hip replacement, right 11/04/2019   Subungual hematoma of foot 03/15/2017   Trigger finger of left hand 07/14/2020   Trigger finger of right hand 07/14/2020   Urinary incontinence 10/09/2019   Vitamin D deficiency 03/05/2022    PAST SURGICAL HISTORY: Past Surgical History:  Procedure Laterality Date   BREAST BIOPSY Right 09/20/2016   malignant   ERCP W/ SPHICTEROTOMY     pt denies   HEMORRHOID SURGERY     pt denies   IR URETERAL STENT RIGHT NEW ACCESS W/O SEP NEPHROSTOMY CATH  02/09/2020   MASTECTOMY Right 01/11/2017   MASTECTOMY W/ SENTINEL NODE BIOPSY Right 01/11/2017   Procedure: RIGHT MASTECTOMY WITH SENTINEL LYMPH NODE BIOPSY;  Surgeon: [name] Klein, MD;  Location: [facility] OR;  Service: General;  Laterality: Right;   NEPHROLITHOTOMY Right 02/09/2020   Procedure: NEPHROLITHOTOMY PERCUTANEOUS;  Surgeon: Kathie Rhodes, MD;  Location: [facility] ORS;  Service: Urology;  Laterality: Right;   PACEMAKER PLACEMENT Left    MedTronic EnPulse [id number] no heartbeat without pacemaker   PERMANENT PACEMAKER GENERATOR CHANGE N/A 07/16/2012   Procedure: PERMANENT PACEMAKER GENERATOR CHANGE;  Surgeon: Deboraha Sprang, MD;  Location: [facility] CATH LAB;  Service: Cardiovascular;  Laterality: N/A;   POLYPECTOMY     Uterine   PPM GENERATOR CHANGEOUT N/A 04/10/2022   Procedure: PPM GENERATOR CHANGEOUT;  Surgeon: Deboraha Sprang, MD;  Location: [facility] CV LAB;  Service: Cardiovascular;  Laterality: N/A;   TOTAL HIP ARTHROPLASTY Right 04/28/2019   Procedure: RIGHT  TOTAL HIP ARTHROPLASTY ANTERIOR APPROACH;  Surgeon: Leandrew Koyanagi, MD;  Location: [facility];  Service: Orthopedics;  Laterality: Right;    FAMILY HISTORY: Family History  Problem Relation Age of Onset   Sudden death Mother        d.1s   Breast cancer Mother 42   Pulmonary embolism Father        Possible   Parkinson's disease Brother    Schizophrenia Brother    Lung cancer Paternal Grandfather        history of smoking   Breast cancer Maternal Aunt        d.78s    SOCIAL HISTORY: Social History   Socioeconomic History  Marital status: Widowed    Spouse name: Not on file   Number of children: Not on file   Years of education: 16   Highest education level: Bachelor's degree (e.g., BA, AB, BS)  Occupational History   Occupation: Retired    Comment: Publishing rights manager; manages farm  Tobacco Use   Smoking status: Former    Packs/day: 0.20    Years: 25.00    Total pack years: 5.00    Types: Cigarettes    Quit date: 11/21/1971    Years since quitting: 50.6   Smokeless tobacco: Never  Vaping Use   Vaping Use: Never used  Substance and Sexual Activity   Alcohol use: Yes    Comment: wine   Drug use: No   Sexual activity: Not on file  Other Topics Concern   Not on file  Social History Narrative   Not on file   Social Determinants of Health   Financial Resource Strain: Not on file  Food Insecurity: Not on file  Transportation Needs: Unmet Transportation Needs (04/11/2022)   PRAPARE - Hydrologist (Medical): Yes    Lack of Transportation (Non-Medical): Yes  Physical Activity: Not on file  Stress: Not on file  Social Connections: Not on file  Intimate Partner Violence: Not on file    PHYSICAL EXAM  GENERAL EXAM/CONSTITUTIONAL: Vitals:  Vitals:   06/15/22 1340  BP: (!) 175/84  Pulse: 68  Weight: 113 lb (51.3 kg)   Body mass index is 20.02 kg/m. Wt Readings from Last 3 Encounters:  06/15/22 113 lb (51.3 kg)  04/10/22 113 lb 15.7 oz  (51.7 kg)  03/09/22 114 lb (51.7 kg)   Patient is in no distress; well developed, nourished and groomed; neck is supple  EYES: Pupils round and reactive to light, Visual fields full to confrontation, Extraocular movements intacts,   MUSCULOSKELETAL: Gait, strength, tone, movements noted in Neurologic exam below  NEUROLOGIC: MENTAL STATUS:     11/30/2021    2:11 PM  MMSE - Mini Mental State Exam  Orientation to time 3  Orientation to Place 4  Registration 3  Attention/ Calculation 0  Recall 2  Language- name 2 objects 2  Language- repeat 1  Language- follow 3 step command 3  Language- read & follow direction 1  Write a sentence 1  Copy design 1  Total score 21    CRANIAL NERVE: 2nd, 3rd, 4th, 6th - pupils equal and reactive to light, visual fields full to confrontation, extraocular muscles intact, no nystagmus 5th - facial sensation symmetric 7th - facial strength symmetric 8th - hearing intact 9th - palate elevates symmetrically, uvula midline 11th - shoulder shrug symmetric 12th - tongue protrusion midline  MOTOR:  normal bulk and tone, full strength in the BUE, BLE  SENSORY:  normal and symmetric to light touch, pinprick, temperature, vibration  COORDINATION:  finger-nose-finger, fine finger movements normal  REFLEXES:  deep tendon reflexes present and symmetric  GAIT/STATION:  normal   DIAGNOSTIC DATA (LABS, IMAGING, TESTING) - I reviewed patient records, labs, notes, testing and imaging myself where available.  Lab Results  Component Value Date   WBC 5.6 04/10/2022   HGB 11.9 (L) 04/10/2022   HCT 35.8 (L) 04/10/2022   MCV 94.5 04/10/2022   PLT 163 04/10/2022      Component Value Date/Time   NA 141 04/20/2022 1528   NA 140 07/25/2017 1347   K 3.7 04/20/2022 1528   K 4.3 07/25/2017 1347  CL 105 04/20/2022 1528   CO2 22 04/20/2022 1528   CO2 26 07/25/2017 1347   GLUCOSE 123 (H) 04/20/2022 1528   GLUCOSE 91 04/10/2022 1357   GLUCOSE 77  07/25/2017 1347   BUN 37 (H) 04/20/2022 1528   BUN 27.4 (H) 07/25/2017 1347   CREATININE 0.90 04/20/2022 1528   CREATININE 1.11 (H) 09/10/2020 1337   CREATININE 1.1 07/25/2017 1347   CALCIUM 10.2 04/20/2022 1528   CALCIUM 9.8 07/25/2017 1347   PROT 6.9 02/27/2022 1456   PROT 6.6 07/25/2017 1347   ALBUMIN 4.6 02/27/2022 1456   ALBUMIN 4.0 07/25/2017 1347   AST 21 02/27/2022 1456   AST 20 09/10/2020 1337   AST 29 07/25/2017 1347   ALT 16 02/27/2022 1456   ALT 9 09/10/2020 1337   ALT 28 07/25/2017 1347   ALKPHOS 53 02/27/2022 1456   ALKPHOS 75 07/25/2017 1347   BILITOT 1.1 02/27/2022 1456   BILITOT 1.2 09/10/2020 1337   BILITOT 1.21 (H) 07/25/2017 1347   GFRNONAA 55 (L) 04/10/2022 1357   GFRNONAA 50 (L) 09/10/2020 1337   GFRNONAA 57 (L) 07/15/2012 1714   GFRAA 58 (L) 02/03/2020 1448   GFRAA 66 07/15/2012 1714   Lab Results  Component Value Date   CHOL 263 (H) 02/27/2022   HDL 103.10 02/27/2022   LDLCALC 68 11/22/2021   LDLDIRECT 135.0 02/27/2022   TRIG 288.0 (H) 02/27/2022   CHOLHDL 3 02/27/2022   Lab Results  Component Value Date   HGBA1C 5.9 02/27/2022   Lab Results  Component Value Date   VITAMINB12 449 02/27/2022   Lab Results  Component Value Date   TSH 18.93 (H) 02/27/2022    Head CT 10/23/2021 Chronic atrophic and ischemic changes without acute abnormality.   ASSESSMENT AND PLAN  86 y.o. year old female with hypertension, hyperlipidemia and hypothyroidism who is presenting with memory decline for the past year.  During this time, she also lost her husband that she described as her light and putting 2 of her beloved horses down.  She completed a full neuropsych evaluation and was diagnosed with late mild Alzheimer disease.  I spent extended amount of time discussing with patient her diagnosis and what it means for her in the future.  At this stage I am going to start her on Aricept, initially 5 mg nightly for 1 month then increase to 10 mg nightly.  We  discussed the side effect of the medication including dizziness, diarrhea and vivid dreams.  In the future she might need Namenda but not currently.  She is planning to move to [place] next month, advised her to set up care with a neurologist as soon as she settled in.  Currently she is not driving but at the moment I also advised her not to drive.  If she is attempted to drive in the future, recommend to go to the [facility] and have a formal driving test.  I also advised her to contact the office in the future for any records, questions or concerns. I have also increase her Lipitor to 40 mg nightly due to chronic ischemic changes noted in her MRI Brain.    1. Mild late onset Alzheimer's dementia without behavioral disturbance, psychotic disturbance, mood disturbance, or anxiety ([place])   2. Husband deceased      Patient Instructions  Start with Aricept 5 mg nightly for 1 month then increase to 10 mg nightly Continue other medications Set up care with a neurologist once you moved to  [place] Good luck with your move to [place], please do not hesitate to contact us for any questions or concerns.    There are well-accepted and sensible ways to reduce risk for Alzheimers disease and other degenerative brain disorders .  Exercise Daily Walk A daily 20 minute walk should be part of your routine. Disease related apathy can be a significant roadblock to exercise and the only way to overcome this is to make it a daily routine and perhaps have a reward at the end (something your loved one loves to eat or drink perhaps) or a personal trainer coming to the home can also be very useful. Most importantly, the patient is much more likely to exercise if the caregiver / spouse does it with him/her. In general a structured, repetitive schedule is best.  General Health: Any diseases which effect your body will effect your brain such as a pneumonia, urinary infection, blood clot, heart attack or stroke. Keep contact  with your primary care doctor for regular follow ups.  Sleep. A good nights sleep is healthy for the brain. Seven hours is recommended. If you have insomnia or poor sleep habits we can give you some instructions. If you have sleep apnea wear your mask.  Diet: Eating a heart healthy diet is also a good idea; fish and poultry instead of red meat, nuts (mostly non-peanuts), vegetables, fruits, olive oil or canola oil (instead of butter), minimal salt (use other spices to flavor foods), whole grain rice, bread, cereal and pasta and wine in moderation.Research is now showing that the MIND diet, which is a combination of The Mediterranean diet and the DASH diet, is beneficial for cognitive processing and longevity. Information about this diet can be found in The MIND Diet, a book by Doyne Keel, MS, RDN, and online at [employer]book[employer].si  Finances, Power of Attorney and Advance Directives: You should consider putting legal safeguards in place with regard to financial and medical decision making. While the spouse always has power of attorney for medical and financial issues in the absence of any form, you should consider what you want in case the spouse / caregiver is no longer around or capable of making decisions.     No orders of the defined types were placed in this encounter.   Meds ordered this encounter  Medications   donepezil (ARICEPT) 10 MG tablet    Sig: Take 1 tablet (10 mg total) by mouth at bedtime.    Dispense:  30 tablet    Refill:  3   atorvastatin (LIPITOR) 40 MG tablet    Sig: TAKE 1 TABLET(10 MG) BY MOUTH DAILY    Dispense:  90 tablet    Refill:  3   donepezil (ARICEPT) 5 MG tablet    Sig: Take 1 tablet (5 mg total) by mouth at bedtime.    Dispense:  30 tablet    Refill:  0   atorvastatin (LIPITOR) 40 MG tablet    Sig: Take 1 tablet (40 mg total) by mouth daily.    Dispense:  30 tablet    Refill:  0    Return in about 1 year (around  06/16/2023).  I have spent a total of 65 minutes dedicated to this patient today, preparing to see patient, performing a medically appropriate examination and evaluation, ordering tests and/or medications and procedures, and counseling and educating the patient/family/caregiver; independently interpreting result and communicating results to the family/patient/caregiver; and documenting clinical information in the electronic medical record.  Alric Ran, MD 06/15/2022, 7:19 PM  [facility] [address], [place], [place] [address] [phone]

## 2022-06-20 ENCOUNTER — Ambulatory Visit (INDEPENDENT_AMBULATORY_CARE_PROVIDER_SITE_OTHER): Payer: Medicare HMO | Admitting: Psychology

## 2022-06-20 ENCOUNTER — Encounter: Payer: Self-pay | Admitting: Psychology

## 2022-06-20 DIAGNOSIS — G301 Alzheimer's disease with late onset: Secondary | ICD-10-CM

## 2022-06-20 DIAGNOSIS — F02A0 Dementia in other diseases classified elsewhere, mild, without behavioral disturbance, psychotic disturbance, mood disturbance, and anxiety: Secondary | ICD-10-CM

## 2022-06-20 NOTE — Progress Notes (Signed)
   Neuropsychology Feedback Session Tillie Rung. [place] Department of Neurology  Reason for Referral:   Danielle [name] is a 86 y.o. right-handed Caucasian female referred by  Alric Ran, M.D. , to characterize her current cognitive functioning and assist with diagnostic clarity and treatment planning in the context of subjective cognitive decline.   Feedback:   Danielle [name] completed a comprehensive neuropsychological evaluation on 06/02/2022. Please refer to that encounter for the full report and recommendations. Briefly, results suggested diffuse and fairly severe cognitive impairment relative to age-matched peers. She performed well across tasks assessing basic attention, receptive language, phonemic fluency, and confrontation naming. However, impaired performances were exhibited across all other assessed cognitive domains. This includes processing speed, complex attention, cognitive flexibility, safety/judgment, semantic fluency, visuospatial abilities, and all aspects of learning and memory. The overall cause of ongoing cognitive impairment is somewhat unclear. However, I do have concerns surrounding a late-onset presentation of Alzheimer's disease. Across memory tasks, she did not benefit from repeated exposure to information, was fully amnestic (i.e., 0% retention) across all administered memory tasks, and performed poorly across recognition trials. Taken together, this suggests rapid forgetting and a significant memory storage impairment, both of which are the hallmark memory characteristics of this illness. Impairments in semantic fluency (relative to adequate phonemic fluency performance) and visuospatial abilities also aligns well with this condition. She did perform well across confrontation naming tasks. However, this illness appears to fit her clinical presentation best at the present time.   This feedback session was completed with Danielle [name]  at her recommendation. I discussed the limitations of evaluation and management by telemedicine and the availability of in person appointments. Danielle [name] expressed her understanding and agreed to proceed. Content of the current session focused on the results of her mother's neuropsychological evaluation. [name] was given the opportunity to ask questions and her questions were answered. She was encouraged to reach out should additional questions arise. Danielle [name]'s report is available for her and her family to review on MyChart.      25 minutes were spent conducting the current feedback session with Danielle [name]. However, as Danielle [name] was not present, no billing was completed.

## 2022-06-26 ENCOUNTER — Telehealth: Payer: Self-pay | Admitting: Neurology

## 2022-06-27 ENCOUNTER — Ambulatory Visit (INDEPENDENT_AMBULATORY_CARE_PROVIDER_SITE_OTHER): Payer: Medicare HMO | Admitting: Internal Medicine

## 2022-06-27 ENCOUNTER — Encounter: Payer: Self-pay | Admitting: Internal Medicine

## 2022-06-27 ENCOUNTER — Ambulatory Visit (INDEPENDENT_AMBULATORY_CARE_PROVIDER_SITE_OTHER): Payer: Medicare HMO

## 2022-06-27 VITALS — BP 136/72 | HR 81 | Temp 98.4°F | Ht 63.0 in | Wt 108.0 lb

## 2022-06-27 DIAGNOSIS — R1011 Right upper quadrant pain: Secondary | ICD-10-CM | POA: Diagnosis not present

## 2022-06-27 DIAGNOSIS — R7302 Impaired glucose tolerance (oral): Secondary | ICD-10-CM | POA: Diagnosis not present

## 2022-06-27 DIAGNOSIS — Z135 Encounter for screening for eye and ear disorders: Secondary | ICD-10-CM

## 2022-06-27 DIAGNOSIS — Z0000 Encounter for general adult medical examination without abnormal findings: Secondary | ICD-10-CM

## 2022-06-27 DIAGNOSIS — F03A4 Unspecified dementia, mild, with anxiety: Secondary | ICD-10-CM | POA: Diagnosis not present

## 2022-06-27 LAB — BASIC METABOLIC PANEL
BUN: 27 mg/dL — ABNORMAL HIGH (ref 6–23)
CO2: 26 mEq/L (ref 19–32)
Calcium: 9.6 mg/dL (ref 8.4–10.5)
Chloride: 107 mEq/L (ref 96–112)
Creatinine, Ser: 0.86 mg/dL (ref 0.40–1.20)
GFR: 61.97 mL/min (ref >60.00)
Glucose, Bld: 90 mg/dL (ref 70–99)
Potassium: 4.4 mEq/L (ref 3.5–5.1)
Sodium: 141 mEq/L (ref 135–145)

## 2022-06-27 LAB — HEPATIC FUNCTION PANEL
ALT: 13 U/L (ref 0–35)
AST: 20 U/L (ref 0–37)
Albumin: 4.4 g/dL (ref 3.5–5.2)
Alkaline Phosphatase: 49 U/L (ref 39–117)
Bilirubin, Direct: 0.2 mg/dL (ref 0.0–0.3)
Total Bilirubin: 1.1 mg/dL (ref 0.2–1.2)
Total Protein: 6.6 g/dL (ref 6.0–8.3)

## 2022-06-27 LAB — CBC WITH DIFFERENTIAL/PLATELET
Basophils Absolute: 0.1 10*3/uL (ref 0.0–0.1)
Basophils Relative: 1 % (ref 0.0–3.0)
Eosinophils Absolute: 0.2 10*3/uL (ref 0.0–0.7)
Eosinophils Relative: 3.2 % (ref 0.0–5.0)
HCT: 32.4 % — ABNORMAL LOW (ref 36.0–46.0)
Hemoglobin: 11 g/dL — ABNORMAL LOW (ref 12.0–15.0)
Lymphocytes Relative: 24.9 % (ref 12.0–46.0)
Lymphs Abs: 1.4 10*3/uL (ref 0.7–4.0)
MCHC: 34 g/dL (ref 30.0–36.0)
MCV: 91.8 fl (ref 78.0–100.0)
Monocytes Absolute: 0.5 10*3/uL (ref 0.1–1.0)
Monocytes Relative: 9.2 % (ref 3.0–12.0)
Neutro Abs: 3.5 10*3/uL (ref 1.4–7.7)
Neutrophils Relative %: 61.7 % (ref 43.0–77.0)
Platelets: 188 10*3/uL (ref 150.0–400.0)
RBC: 3.53 Mil/uL — ABNORMAL LOW (ref 3.87–5.11)
RDW: 13.7 % (ref 11.5–15.5)
WBC: 5.6 10*3/uL (ref 4.0–10.5)

## 2022-06-27 LAB — LIPASE: Lipase: 85 U/L — ABNORMAL HIGH (ref 11.0–59.0)

## 2022-06-27 NOTE — Progress Notes (Signed)
Patient ID: Danielle [name], female   DOB: March 11, 1936, 86 y.o.   MRN: [id number]        Chief Complaint: follow up with c/o RUQ pain for 2 wks, with daughter in law       HPI:  Danielle [name] is a 86 y.o. female here with c/o 2 wks onset RUQ pain, gradually worsening now mod to severe, without fever, chills, n/v/d; no radiation to the back, wt loss; no prior hx of GB issue in past.  Denies worsening reflux, dysphagia, n/v, bowel change or blood.  Pt denies chest pain, increased sob or doe, wheezing, orthopnea, PND, increased LE swelling, palpitations, dizziness or syncope.   Pt denies polydipsia, polyuria, or new focal neuro s/s.   Dementia overall stable symptomatically, and not assoc with behavioral changes such as hallucinations, paranoia, or agitation. Incidentally moving to [place] soon aug 16 to be cared for by family.       Wt Readings from Last 3 Encounters:  06/27/22 108 lb (49 kg)  06/27/22 108 lb (49 kg)  06/15/22 113 lb (51.3 kg)   BP Readings from Last 3 Encounters:  06/27/22 136/72  06/27/22 136/72  06/15/22 (!) 175/84         Past Medical History:  Diagnosis Date   Acute bronchitis 10/20/2008   Allergic rhinitis 01/09/2008   Alopecia 01/21/2015   Arthritis 06/11/2020   Atrioventricular block, complete 12/16/2009   Blood in urine 02/22/2017   Cardiac pacemaker in situ 01/23/2012   Cervical radiculopathy 07/11/2013   Complete atrioventricular block 12/16/2009   Degenerative joint disease of right hip 12/17/2018   Dizziness 08/03/2012   Essential hypertension 01/09/2008   Gait disorder 08/27/2021   Gastroesophageal reflux disease 01/09/2008   Generalized anxiety disorder 01/09/2008   Genetic testing 06/04/2017   Ms. Rudge underwent genetic counseling and testing for hereditary cancer syndromes on 05/24/2017. Her results were negative for mutations in all 46 genes analyzed by [facility]'s 46-gene Common Hereditary Cancers Panel. Genes analyzed include: APC, ATM,  AXIN2, BARD1, BMPR1A, BRCA1, BRCA2, BRIP1, CDH1, CDKN2A, CHEK2, CTNNA1, DICER1, EPCAM, GREM1, HOXB13, KIT, MEN1, MLH1, MSH2, MSH3, MSH6, MUTYH, NBN   History of right mastectomy 01/11/2017   History of thromboembolism of vein 01/09/2008   History of total hip arthroplasty 04/28/2019   Hyperbilirubinemia 12/16/2021   Hyperlipidemia 01/09/2008   Hypoproteinemia 12/16/2021   Hypothyroidism 01/09/2008   Impaired glucose tolerance 07/28/2011   Insomnia 06/15/2009   Left leg swelling 01/21/2015   Low back pain 04/21/2021   Major depressive disorder 01/09/2008   Malignant neoplasm of upper-inner quadrant of female breast 10/06/2016   Mild dementia 06/02/2022   Neck pain 11/04/2019   Normocytic anemia 01/09/2008   Olecranon bursitis of left elbow 05/05/2015   Osteoarthritis cervical spine 02/25/2010   Pacemaker-medtronic 01/23/2012   DOI 1990s Extraction 1997 Generator replacement 2006, 2013   Peripheral venous insufficiency 03/15/2017   Personal history of venous thrombosis and embolism 01/09/2008   Renal calculus, right 02/09/2020   Right hip pain 07/18/2016   Spondylosis 02/25/2010   Status post total hip replacement, right 11/04/2019   Subungual hematoma of foot 03/15/2017   Trigger finger of left hand 07/14/2020   Trigger finger of right hand 07/14/2020   Urinary incontinence 10/09/2019   Vitamin D deficiency 03/05/2022   Past Surgical History:  Procedure Laterality Date   BREAST BIOPSY Right 09/20/2016   malignant   ERCP W/ SPHICTEROTOMY     pt denies   HEMORRHOID SURGERY  pt denies   IR URETERAL STENT RIGHT NEW ACCESS W/O SEP NEPHROSTOMY CATH  02/09/2020   MASTECTOMY Right 01/11/2017   MASTECTOMY W/ SENTINEL NODE BIOPSY Right 01/11/2017   Procedure: RIGHT MASTECTOMY WITH SENTINEL LYMPH NODE BIOPSY;  Surgeon: [name] Klein, MD;  Location: [facility];  Service: General;  Laterality: Right;   NEPHROLITHOTOMY Right 02/09/2020   Procedure: NEPHROLITHOTOMY PERCUTANEOUS;  Surgeon:  Kathie Rhodes, MD;  Location: [facility];  Service: Urology;  Laterality: Right;   PACEMAKER PLACEMENT Left    MedTronic EnPulse [id number] no heartbeat without pacemaker   PERMANENT PACEMAKER GENERATOR CHANGE N/A 07/16/2012   Procedure: PERMANENT PACEMAKER GENERATOR CHANGE;  Surgeon: Deboraha Sprang, MD;  Location: [facility] CATH LAB;  Service: Cardiovascular;  Laterality: N/A;   POLYPECTOMY     Uterine   PPM GENERATOR CHANGEOUT N/A 04/10/2022   Procedure: PPM GENERATOR CHANGEOUT;  Surgeon: Deboraha Sprang, MD;  Location: [place] CV LAB;  Service: Cardiovascular;  Laterality: N/A;   TOTAL HIP ARTHROPLASTY Right 04/28/2019   Procedure: RIGHT TOTAL HIP ARTHROPLASTY ANTERIOR APPROACH;  Surgeon: Leandrew Koyanagi, MD;  Location: [facility];  Service: Orthopedics;  Laterality: Right;    reports that she quit smoking about 50 years ago. Her smoking use included cigarettes. She has a 5.00 pack-year smoking history. She has never used smokeless tobacco. She reports current alcohol use. She reports that she does not use drugs. family history includes Breast cancer in her maternal aunt; Breast cancer (age of onset: 12) in her mother; Lung cancer in her paternal grandfather; Parkinson's disease in her brother; Pulmonary embolism in her father; Schizophrenia in her brother; Sudden death in her mother. Allergies  Allergen Reactions   Codeine Nausea And Vomiting    Pt is unaware of reaction    Oxycodone Nausea And Vomiting    Pt is unaware of reaction    Current Outpatient Medications on File Prior to Visit  Medication Sig Dispense Refill   acetaminophen (TYLENOL) 500 MG tablet Take 500-1,000 mg by mouth every 6 (six) hours as needed for mild pain.     alendronate (FOSAMAX) 70 MG tablet Take 1 tablet (70 mg total) by mouth every 7 (seven) days. Take with a full glass of water on an empty stomach. 12 tablet 3   atenolol (TENORMIN) 50 MG tablet Take 1 tablet (50 mg total) by mouth daily. 90 tablet 3   atorvastatin (LIPITOR)  40 MG tablet TAKE 1 TABLET(10 MG) BY MOUTH DAILY 90 tablet 3   atorvastatin (LIPITOR) 40 MG tablet Take 1 tablet (40 mg total) by mouth daily. 30 tablet 0   azelastine (ASTELIN) 0.1 % nasal spray Place 1 spray into both nostrils as needed (Summer Allergies). 30 mL 11   donepezil (ARICEPT) 10 MG tablet Take 1 tablet (10 mg total) by mouth at bedtime. 30 tablet 3   donepezil (ARICEPT) 5 MG tablet Take 1 tablet (5 mg total) by mouth at bedtime. 30 tablet 0   enalapril (VASOTEC) 10 MG tablet Take 1 tablet (10 mg total) by mouth daily. 90 tablet 3   gabapentin (NEURONTIN) 100 MG capsule Take 1 capsule (100 mg total) by mouth 3 (three) times daily. 270 capsule 1   levothyroxine (SYNTHROID) 112 MCG tablet Take 1 tablet (112 mcg total) by mouth daily before breakfast. 90 tablet 3   LORazepam (ATIVAN) 0.5 MG tablet Take 1 tablet (0.5 mg total) by mouth 2 (two) times daily as needed for anxiety. 60 tablet 2   Multiple Minerals-Vitamins (CAL-MAG-ZINC-D  PO) Take 1 tablet by mouth in the morning and at bedtime.     QUEtiapine (SEROQUEL) 25 MG tablet Take 1 tablet (25 mg total) by mouth 3 (three) times daily. 270 tablet 1   No current facility-administered medications on file prior to visit.        ROS:  All others reviewed and negative.  Objective        PE:  BP 136/72 (BP Location: Left Arm, Patient Position: Sitting, Cuff Size: Normal)   Pulse 81   Temp 98.4 F (36.9 C) (Oral)   Ht $R'5\' 3"'VN$  (1.6 m)   Wt 108 lb (49 kg)   SpO2 97%   BMI 19.13 kg/m                 Constitutional: Pt appears in NAD               HENT: Head: NCAT.                Right Ear: External ear normal.                 Left Ear: External ear normal.                Eyes: . Pupils are equal, round, and reactive to light. Conjunctivae and EOM are normal               Nose: without d/c or deformity               Neck: Neck supple. Gross normal ROM               Cardiovascular: Normal rate and regular rhythm.                  Pulmonary/Chest: Effort normal and breath sounds without rales or wheezing.                Abd:  Soft, mod to severe RUQ tender, ND, + BS, no organomegaly               Neurological: Pt is alert. At baseline orientation, motor grossly intact               Skin: Skin is warm. No rashes, no other new lesions, LE edema - none               Psychiatric: Pt behavior is normal without agitation   Micro: none  Cardiac tracings I have personally interpreted today:  none  Pertinent Radiological findings (summarize): none   Lab Results  Component Value Date   WBC 5.6 04/10/2022   HGB 11.9 (L) 04/10/2022   HCT 35.8 (L) 04/10/2022   PLT 163 04/10/2022   GLUCOSE 123 (H) 04/20/2022   CHOL 263 (H) 02/27/2022   TRIG 288.0 (H) 02/27/2022   HDL 103.10 02/27/2022   LDLDIRECT 135.0 02/27/2022   LDLCALC 68 11/22/2021   ALT 16 02/27/2022   AST 21 02/27/2022   NA 141 04/20/2022   K 3.7 04/20/2022   CL 105 04/20/2022   CREATININE 0.90 04/20/2022   BUN 37 (H) 04/20/2022   CO2 22 04/20/2022   TSH 18.93 (H) 02/27/2022   INR 1.0 02/09/2020   HGBA1C 5.9 02/27/2022   Assessment/Plan:  Danielle [name] is a 86 y.o. White or Caucasian [1] female with  has a past medical history of Acute bronchitis (10/20/2008), Allergic rhinitis (01/09/2008), Alopecia (01/21/2015), Arthritis (06/11/2020), Atrioventricular block, complete (12/16/2009), Blood in urine (02/22/2017), Cardiac pacemaker in  situ (01/23/2012), Cervical radiculopathy (07/11/2013), Complete atrioventricular block (12/16/2009), Degenerative joint disease of right hip (12/17/2018), Dizziness (08/03/2012), Essential hypertension (01/09/2008), Gait disorder (08/27/2021), Gastroesophageal reflux disease (01/09/2008), Generalized anxiety disorder (01/09/2008), Genetic testing (06/04/2017), History of right mastectomy (01/11/2017), History of thromboembolism of vein (01/09/2008), History of total hip arthroplasty (04/28/2019), Hyperbilirubinemia (12/16/2021),  Hyperlipidemia (01/09/2008), Hypoproteinemia (12/16/2021), Hypothyroidism (01/09/2008), Impaired glucose tolerance (07/28/2011), Insomnia (06/15/2009), Left leg swelling (01/21/2015), Low back pain (04/21/2021), Major depressive disorder (01/09/2008), Malignant neoplasm of upper-inner quadrant of female breast (10/06/2016), Mild dementia (06/02/2022), Neck pain (11/04/2019), Normocytic anemia (01/09/2008), Olecranon bursitis of left elbow (05/05/2015), Osteoarthritis cervical spine (02/25/2010), Pacemaker-medtronic (01/23/2012), Peripheral venous insufficiency (03/15/2017), Personal history of venous thrombosis and embolism (01/09/2008), Renal calculus, right (02/09/2020), Right hip pain (07/18/2016), Spondylosis (02/25/2010), Status post total hip replacement, right (11/04/2019), Subungual hematoma of foot (03/15/2017), Trigger finger of left hand (07/14/2020), Trigger finger of right hand (07/14/2020), Urinary incontinence (10/09/2019), and Vitamin D deficiency (03/05/2022).  RUQ pain Exam c/w high suspicion for GB d/o - for RUQ u/s, labs with CBC and LFTs  Impaired glucose tolerance Lab Results  Component Value Date   HGBA1C 5.9 02/27/2022   Stable, pt to continue current medical treatment  - diet, wt control   Mild dementia Overall stable, continue family support, aricept 10 mg and  to f/u any worsening symptoms or concerns  Followup: Return if symptoms worsen or fail to improve.  Cathlean Cower, MD 06/27/2022 8:31 PM [facility]

## 2022-06-27 NOTE — Patient Instructions (Signed)
Danielle [name] , Thank you for taking time to come for your Medicare Wellness Visit. I appreciate your ongoing commitment to your health goals. Please review the following plan we discussed and let me know if I can assist you in the future.   Screening recommendations/referrals: Colonoscopy: Discontinued Mammogram: Discontinued Bone Density: 03/01/2022; due every 2-3 years Recommended yearly ophthalmology/optometry visit for glaucoma screening and checkup Recommended yearly dental visit for hygiene and checkup  Vaccinations: Influenza vaccine: 08/26/2021 Pneumococcal vaccine: 12/18/2014, 09/11/2019 Tdap vaccine: 10/23/2019; due every 10 years Shingles vaccine: never done   Covid-19: 03/02/2020, 03/23/2020, 09/04/2020  Advanced directives: Yes; Please bring a copy of your health care power of attorney and living will to the office at your convenience.  Conditions/risks identified: Yes  Next appointment: Please schedule your next Medicare Wellness Visit with Nurse Mignon Pine within 1 year by calling [phone].    Preventive Care 45 Years and Older, Female Preventive care refers to lifestyle choices and visits with your health care provider that can promote health and wellness. What does preventive care include? A yearly physical exam. This is also called an annual well check. Dental exams once or twice a year. Routine eye exams. Ask your health care provider how often you should have your eyes checked. Personal lifestyle choices, including: Daily care of your teeth and gums. Regular physical activity. Eating a healthy diet. Avoiding tobacco and drug use. Limiting alcohol use. Practicing safe sex. Taking low-dose aspirin every day. Taking vitamin and mineral supplements as recommended by your health care provider. What happens during an annual well check? The services and screenings done by your health care provider during your annual well check will depend on your age, overall health,  lifestyle risk factors, and family history of disease. Counseling  Your health care provider may ask you questions about your: Alcohol use. Tobacco use. Drug use. Emotional well-being. Home and relationship well-being. Sexual activity. Eating habits. History of falls. Memory and ability to understand (cognition). Work and work Statistician. Reproductive health. Screening  You may have the following tests or measurements: Height, weight, and BMI. Blood pressure. Lipid and cholesterol levels. These may be checked every 5 years, or more frequently if you are over 64 years old. Skin check. Lung cancer screening. You may have this screening every year starting at age 84 if you have a 30-pack-year history of smoking and currently smoke or have quit within the past 15 years. Fecal occult blood test (FOBT) of the stool. You may have this test every year starting at age 73. Flexible sigmoidoscopy or colonoscopy. You may have a sigmoidoscopy every 5 years or a colonoscopy every 10 years starting at age 11. Hepatitis C blood test. Hepatitis B blood test. Sexually transmitted disease (STD) testing. Diabetes screening. This is done by checking your blood sugar (glucose) after you have not eaten for a while (fasting). You may have this done every 1-3 years. Bone density scan. This is done to screen for osteoporosis. You may have this done starting at age 26. Mammogram. This may be done every 1-2 years. Talk to your health care provider about how often you should have regular mammograms. Talk with your health care provider about your test results, treatment options, and if necessary, the need for more tests. Vaccines  Your health care provider may recommend certain vaccines, such as: Influenza vaccine. This is recommended every year. Tetanus, diphtheria, and acellular pertussis (Tdap, Td) vaccine. You may need a Td booster every 10 years. Zoster vaccine. You may need  this after age  16. Pneumococcal 13-valent conjugate (PCV13) vaccine. One dose is recommended after age 65. Pneumococcal polysaccharide (PPSV23) vaccine. One dose is recommended after age 10. Talk to your health care provider about which screenings and vaccines you need and how often you need them. This information is not intended to replace advice given to you by your health care provider. Make sure you discuss any questions you have with your health care provider. Document Released: 12/03/2015 Document Revised: 07/26/2016 Document Reviewed: 09/07/2015 Elsevier Interactive Patient Education  2017 [place] Prevention in the Home Falls can cause injuries. They can happen to people of all ages. There are many things you can do to make your home safe and to help prevent falls. What can I do on the outside of my home? Regularly fix the edges of walkways and driveways and fix any cracks. Remove anything that might make you trip as you walk through a door, such as a raised step or threshold. Trim any bushes or trees on the path to your home. Use bright outdoor lighting. Clear any walking paths of anything that might make someone trip, such as rocks or tools. Regularly check to see if handrails are loose or broken. Make sure that both sides of any steps have handrails. Any raised decks and porches should have guardrails on the edges. Have any leaves, snow, or ice cleared regularly. Use sand or salt on walking paths during winter. Clean up any spills in your garage right away. This includes oil or grease spills. What can I do in the bathroom? Use night lights. Install grab bars by the toilet and in the tub and shower. Do not use towel bars as grab bars. Use non-skid mats or decals in the tub or shower. If you need to sit down in the shower, use a plastic, non-slip stool. Keep the floor dry. Clean up any water that spills on the floor as soon as it happens. Remove soap buildup in the tub or shower  regularly. Attach bath mats securely with double-sided non-slip rug tape. Do not have throw rugs and other things on the floor that can make you trip. What can I do in the bedroom? Use night lights. Make sure that you have a light by your bed that is easy to reach. Do not use any sheets or blankets that are too big for your bed. They should not hang down onto the floor. Have a firm chair that has side arms. You can use this for support while you get dressed. Do not have throw rugs and other things on the floor that can make you trip. What can I do in the kitchen? Clean up any spills right away. Avoid walking on wet floors. Keep items that you use a lot in easy-to-reach places. If you need to reach something above you, use a strong step stool that has a grab bar. Keep electrical cords out of the way. Do not use floor polish or wax that makes floors slippery. If you must use wax, use non-skid floor wax. Do not have throw rugs and other things on the floor that can make you trip. What can I do with my stairs? Do not leave any items on the stairs. Make sure that there are handrails on both sides of the stairs and use them. Fix handrails that are broken or loose. Make sure that handrails are as long as the stairways. Check any carpeting to make sure that it is firmly attached to  the stairs. Fix any carpet that is loose or worn. Avoid having throw rugs at the top or bottom of the stairs. If you do have throw rugs, attach them to the floor with carpet tape. Make sure that you have a light switch at the top of the stairs and the bottom of the stairs. If you do not have them, ask someone to add them for you. What else can I do to help prevent falls? Wear shoes that: Do not have high heels. Have rubber bottoms. Are comfortable and fit you well. Are closed at the toe. Do not wear sandals. If you use a stepladder: Make sure that it is fully opened. Do not climb a closed stepladder. Make sure that  both sides of the stepladder are locked into place. Ask someone to hold it for you, if possible. Clearly mark and make sure that you can see: Any grab bars or handrails. First and last steps. Where the edge of each step is. Use tools that help you move around (mobility aids) if they are needed. These include: Canes. Walkers. Scooters. Crutches. Turn on the lights when you go into a dark area. Replace any light bulbs as soon as they burn out. Set up your furniture so you have a clear path. Avoid moving your furniture around. If any of your floors are uneven, fix them. If there are any pets around you, be aware of where they are. Review your medicines with your doctor. Some medicines can make you feel dizzy. This can increase your chance of falling. Ask your doctor what other things that you can do to help prevent falls. This information is not intended to replace advice given to you by your health care provider. Make sure you discuss any questions you have with your health care provider. Document Released: 09/02/2009 Document Revised: 04/13/2016 Document Reviewed: 12/11/2014 Elsevier Interactive Patient Education  2017 [employer].

## 2022-06-27 NOTE — Assessment & Plan Note (Signed)
Lab Results  Component Value Date   HGBA1C 5.9 02/27/2022   Stable, pt to continue current medical treatment  - diet, wt control

## 2022-06-27 NOTE — Progress Notes (Signed)
Subjective:   Danielle [name] is a 86 y.o. female who presents for Medicare Annual (Subsequent) preventive examination.  Review of Systems     Cardiac Risk Factors include: advanced age (>49men, >68 women);dyslipidemia;hypertension     Objective:    Today's Vitals   06/27/22 1525  BP: 136/72  Pulse: 81  Temp: 98.4 F (36.9 C)  SpO2: 97%  Weight: 108 lb (49 kg)  Height: 5\' 3"  (1.6 m)  PainSc: 0-No pain   Body mass index is 19.13 kg/m.     06/27/2022    3:32 PM 04/10/2022    2:03 PM 12/16/2021    9:29 PM 12/16/2021    8:10 PM 12/15/2021    5:11 PM 11/05/2021    3:42 PM 10/22/2021   11:37 PM  Advanced Directives  Does Patient Have a Medical Advance Directive? Yes Yes No Yes Yes Yes No  Type of Advance Directive Living will;Healthcare Power of 14/01/2021 Power of [place];Living will Healthcare Power of [place];Living will Healthcare Power of [place];Living will    Does patient want to make changes to medical advance directive? No - Patient declined  No - Patient declined No - Patient declined     Copy of Healthcare Power of Attorney in Chart? No - copy requested  No - copy requested No - copy requested     Would patient like information on creating a medical advance directive?    No - Patient declined   No - Patient declined    Current Medications (verified) Outpatient Encounter Medications as of 06/27/2022  Medication Sig   acetaminophen (TYLENOL) 500 MG tablet Take 500-1,000 mg by mouth every 6 (six) hours as needed for mild pain.   alendronate (FOSAMAX) 70 MG tablet Take 1 tablet (70 mg total) by mouth every 7 (seven) days. Take with a full glass of water on an empty stomach.   atenolol (TENORMIN) 50 MG tablet Take 1 tablet (50 mg total) by mouth daily.   atorvastatin (LIPITOR) 40 MG tablet TAKE 1 TABLET(10 MG) BY MOUTH DAILY   atorvastatin (LIPITOR) 40 MG tablet Take 1 tablet (40 mg total) by mouth daily.   azelastine (ASTELIN)  0.1 % nasal spray Place 1 spray into both nostrils as needed (Summer Allergies).   donepezil (ARICEPT) 10 MG tablet Take 1 tablet (10 mg total) by mouth at bedtime.   donepezil (ARICEPT) 5 MG tablet Take 1 tablet (5 mg total) by mouth at bedtime.   enalapril (VASOTEC) 10 MG tablet Take 1 tablet (10 mg total) by mouth daily.   gabapentin (NEURONTIN) 100 MG capsule Take 1 capsule (100 mg total) by mouth 3 (three) times daily.   levothyroxine (SYNTHROID) 112 MCG tablet Take 1 tablet (112 mcg total) by mouth daily before breakfast.   LORazepam (ATIVAN) 0.5 MG tablet Take 1 tablet (0.5 mg total) by mouth 2 (two) times daily as needed for anxiety.   Multiple Minerals-Vitamins (CAL-MAG-ZINC-D PO) Take 1 tablet by mouth in the morning and at bedtime.   QUEtiapine (SEROQUEL) 25 MG tablet Take 1 tablet (25 mg total) by mouth 3 (three) times daily.   No facility-administered encounter medications on file as of 06/27/2022.    Allergies (verified) Codeine and Oxycodone   History: Past Medical History:  Diagnosis Date   Acute bronchitis 10/20/2008   Allergic rhinitis 01/09/2008   Alopecia 01/21/2015   Arthritis 06/11/2020   Atrioventricular block, complete 12/16/2009   Blood in urine 02/22/2017   Cardiac pacemaker in  situ 01/23/2012   Cervical radiculopathy 07/11/2013   Complete atrioventricular block 12/16/2009   Degenerative joint disease of right hip 12/17/2018   Dizziness 08/03/2012   Essential hypertension 01/09/2008   Gait disorder 08/27/2021   Gastroesophageal reflux disease 01/09/2008   Generalized anxiety disorder 01/09/2008   Genetic testing 06/04/2017   Ms. Coghill underwent genetic counseling and testing for hereditary cancer syndromes on 05/24/2017. Her results were negative for mutations in all 46 genes analyzed by [facility]'s 46-gene Common Hereditary Cancers Panel. Genes analyzed include: APC, ATM, AXIN2, BARD1, BMPR1A, BRCA1, BRCA2, BRIP1, CDH1, CDKN2A, CHEK2, CTNNA1, DICER1, EPCAM,  GREM1, HOXB13, KIT, MEN1, MLH1, MSH2, MSH3, MSH6, MUTYH, NBN   History of right mastectomy 01/11/2017   History of thromboembolism of vein 01/09/2008   History of total hip arthroplasty 04/28/2019   Hyperbilirubinemia 12/16/2021   Hyperlipidemia 01/09/2008   Hypoproteinemia 12/16/2021   Hypothyroidism 01/09/2008   Impaired glucose tolerance 07/28/2011   Insomnia 06/15/2009   Left leg swelling 01/21/2015   Low back pain 04/21/2021   Major depressive disorder 01/09/2008   Malignant neoplasm of upper-inner quadrant of female breast 10/06/2016   Mild dementia 06/02/2022   Neck pain 11/04/2019   Normocytic anemia 01/09/2008   Olecranon bursitis of left elbow 05/05/2015   Osteoarthritis cervical spine 02/25/2010   Pacemaker-medtronic 01/23/2012   DOI 1990s Extraction 1997 Generator replacement 2006, 2013   Peripheral venous insufficiency 03/15/2017   Personal history of venous thrombosis and embolism 01/09/2008   Renal calculus, right 02/09/2020   Right hip pain 07/18/2016   Spondylosis 02/25/2010   Status post total hip replacement, right 11/04/2019   Subungual hematoma of foot 03/15/2017   Trigger finger of left hand 07/14/2020   Trigger finger of right hand 07/14/2020   Urinary incontinence 10/09/2019   Vitamin D deficiency 03/05/2022   Past Surgical History:  Procedure Laterality Date   BREAST BIOPSY Right 09/20/2016   malignant   ERCP W/ SPHICTEROTOMY     pt denies   HEMORRHOID SURGERY     pt denies   IR URETERAL STENT RIGHT NEW ACCESS W/O SEP NEPHROSTOMY CATH  02/09/2020   MASTECTOMY Right 01/11/2017   MASTECTOMY W/ SENTINEL NODE BIOPSY Right 01/11/2017   Procedure: RIGHT MASTECTOMY WITH SENTINEL LYMPH NODE BIOPSY;  Surgeon: Almond Lint, MD;  Location: [facility] OR;  Service: General;  Laterality: Right;   NEPHROLITHOTOMY Right 02/09/2020   Procedure: NEPHROLITHOTOMY PERCUTANEOUS;  Surgeon: Ihor Gully, MD;  Location: [facility] ORS;  Service: Urology;  Laterality: Right;   PACEMAKER  PLACEMENT Left    MedTronic EnPulse [id number] no heartbeat without pacemaker   PERMANENT PACEMAKER GENERATOR CHANGE N/A 07/16/2012   Procedure: PERMANENT PACEMAKER GENERATOR CHANGE;  Surgeon: Duke Salvia, MD;  Location: [facility] CATH LAB;  Service: Cardiovascular;  Laterality: N/A;   POLYPECTOMY     Uterine   PPM GENERATOR CHANGEOUT N/A 04/10/2022   Procedure: PPM GENERATOR CHANGEOUT;  Surgeon: Duke Salvia, MD;  Location: [facility] INVASIVE CV LAB;  Service: Cardiovascular;  Laterality: N/A;   TOTAL HIP ARTHROPLASTY Right 04/28/2019   Procedure: RIGHT TOTAL HIP ARTHROPLASTY ANTERIOR APPROACH;  Surgeon: Tarry Kos, MD;  Location: [facility] OR;  Service: Orthopedics;  Laterality: Right;   Family History  Problem Relation Age of Onset   Sudden death Mother        d.26s   Breast cancer Mother 69   Pulmonary embolism Father        Possible   Parkinson's disease Brother    Schizophrenia Brother  Lung cancer Paternal Grandfather        history of smoking   Breast cancer Maternal Aunt        d.78s   Social History   Socioeconomic History   Marital status: Widowed    Spouse name: Not on file   Number of children: Not on file   Years of education: 16   Highest education level: Bachelor's degree (e.g., BA, AB, BS)  Occupational History   Occupation: Retired    Comment: Publishing rights manager; manages farm  Tobacco Use   Smoking status: Former    Packs/day: 0.20    Years: 25.00    Total pack years: 5.00    Types: Cigarettes    Quit date: 11/21/1971    Years since quitting: 50.6   Smokeless tobacco: Never  Vaping Use   Vaping Use: Never used  Substance and Sexual Activity   Alcohol use: Yes    Comment: wine   Drug use: No   Sexual activity: Not on file  Other Topics Concern   Not on file  Social History Narrative   Not on file   Social Determinants of Health   Financial Resource Strain: Low Risk  (06/27/2022)   Overall Financial Resource Strain (CARDIA)    Difficulty of Paying Living  Expenses: Not hard at all  Food Insecurity: No Food Insecurity (06/27/2022)   Hunger Vital Sign    Worried About Running Out of Food in the Last Year: Never true    Ran Out of Food in the Last Year: Never true  Transportation Needs: No Transportation Needs (06/27/2022)   PRAPARE - Hydrologist (Medical): No    Lack of Transportation (Non-Medical): No  Recent Concern: Transportation Needs - Unmet Transportation Needs (04/11/2022)   PRAPARE - Transportation    Lack of Transportation (Medical): Yes    Lack of Transportation (Non-Medical): Yes  Physical Activity: Sufficiently Active (06/27/2022)   Exercise Vital Sign    Days of Exercise per Week: 5 days    Minutes of Exercise per Session: 30 min  Stress: No Stress Concern Present (06/27/2022)   [place]    Feeling of Stress : Not at all  Social Connections: Moderately Integrated (06/27/2022)   Social Connection and Isolation Panel [NHANES]    Frequency of Communication with Friends and Family: More than three times a week    Frequency of Social Gatherings with Friends and Family: More than three times a week    Attends Religious Services: 1 to 4 times per year    Active Member of [employer] or Organizations: Yes    Attends Archivist Meetings: 1 to 4 times per year    Marital Status: Widowed    Tobacco Counseling Counseling given: Not Answered   Clinical Intake:  Pre-visit preparation completed: Yes  Pain : No/denies pain Pain Score: 0-No pain     BMI - recorded: 19.13 Nutritional Status: BMI of 19-24  Normal Nutritional Risks: None Diabetes: No  What is the last grade level you completed in school?: HSG  Diabetic? no  Interpreter Needed?: No  Information entered by :: Lisette Abu, LPN.   Activities of Daily Living    06/27/2022    3:35 PM 12/16/2021    9:34 PM  In your present state of health, do you have any  difficulty performing the following activities:  Hearing? 1   Vision? 1   Difficulty concentrating or making decisions? 1  Walking or climbing stairs? 0   Dressing or bathing? 0   Doing errands, shopping? 1 0  Preparing Food and eating ? N   Using the Toilet? N   In the past six months, have you accidently leaked urine? N   Do you have problems with loss of bowel control? N   Managing your Medications? N   Managing your Finances? N   Housekeeping or managing your Housekeeping? N     Patient Care Team: Biagio Borg, MD as PCP - Particia Lather, Revonda Standard, MD as PCP - Cardiology (Cardiology) Jovita Kussmaul, MD as Consulting Physician (General Surgery) Magrinat, Virgie Dad, MD (Inactive) as Consulting Physician (Oncology) Ladene Artist, MD as Consulting Physician (Gastroenterology) Deboraha Sprang, MD as Consulting Physician (Cardiology) Kennon Holter, NP as Nurse Practitioner (Nurse Practitioner) Gareth Morgan, MD as Referring Physician (Orthopedic Surgery) Leandrew Koyanagi, MD as Attending Physician (Orthopedic Surgery)  Indicate any recent Medical Services you may have received from other than [facility] providers in the past year (date may be approximate).     Assessment:   This is a routine wellness examination for Danielle [name].  Hearing/Vision screen Hearing Screening - Comments:: Patient has difficulty hearing. No hearing aids. Vision Screening - Comments:: Patient does not wear any corrective lenses. No eye doctor on file. Referral placed to [facility].  Dietary issues and exercise activities discussed: Current Exercise Habits: Home exercise routine, Type of exercise: walking;Other - see comments (gardening; working on the farm), Time (Minutes): 30, Frequency (Times/Week): 5, Weekly Exercise (Minutes/Week): 150, Intensity: Moderate, Exercise limited by: orthopedic condition(s)   Goals Addressed   None   Depression Screen    06/27/2022    2:19 PM  02/27/2022    1:40 PM 08/26/2021   11:42 AM 06/11/2020    3:52 PM 10/09/2019    3:05 PM 09/11/2019    2:13 PM 03/01/2018    2:31 PM  PHQ 2/9 Scores  PHQ - 2 Score 0 0 0 0 0 0 0  PHQ- 9 Score 0          Fall Risk    06/27/2022    2:13 PM 02/27/2022    1:40 PM 08/26/2021   11:42 AM 06/11/2020    3:52 PM 10/09/2019    3:05 PM  Fall Risk   Falls in the past year? 1 1 0 0 0  Number falls in past yr: 0 1 0    Injury with Fall? 0 0 0    Risk for fall due to : History of fall(s)      Follow up Falls evaluation completed        [place]:  Any stairs in or around the home? Yes  If so, are there any without handrails? No  Home free of loose throw rugs in walkways, pet beds, electrical cords, etc? Yes  Adequate lighting in your home to reduce risk of falls? Yes   ASSISTIVE DEVICES UTILIZED TO PREVENT FALLS:  Life alert? No  (will check with [employer]) Use of a cane, walker or w/c? No  Grab bars in the bathroom? Yes  Shower chair or bench in shower? Yes  Elevated toilet seat or a handicapped toilet? No   TIMED UP AND GO:  Was the test performed? Yes .  Length of time to ambulate 10 feet: 8 sec.   Gait steady and fast without use of assistive device  Cognitive Function:  06/27/2022    3:36 PM 11/30/2021    2:11 PM  MMSE - Mini Mental State Exam  Not completed: Unable to complete   Orientation to time  3  Orientation to Place  4  Registration  3  Attention/ Calculation  0  Recall  2  Language- name 2 objects  2  Language- repeat  1  Language- follow 3 step command  3  Language- read & follow direction  1  Write a sentence  1  Copy design  1  Total score  21        Immunizations Immunization History  Administered Date(s) Administered   Fluad Quad(high Dose 65+) 09/11/2019, 08/26/2021   Influenza Split 10/31/2011, 08/02/2012   Influenza Whole 09/02/2008   Influenza, High Dose Seasonal PF 12/15/2016, 12/17/2018   PFIZER(Purple  Top)SARS-COV-2 Vaccination 03/02/2020, 03/23/2020, 09/04/2020   Pneumococcal Conjugate-13 12/18/2014   Pneumococcal Polysaccharide-23 01/19/2004, 09/11/2019   Td 09/02/2008   Tdap 10/23/2019   Zoster, Live 12/21/2006    TDAP status: Up to date  Flu Vaccine status: Up to date  Pneumococcal vaccine status: Up to date  Covid-19 vaccine status: Completed vaccines  Qualifies for Shingles Vaccine? Yes   Zostavax completed Yes   Shingrix Completed?: No.    Education has been provided regarding the importance of this vaccine. Patient has been advised to call insurance company to determine out of pocket expense if they have not yet received this vaccine. Advised may also receive vaccine at local pharmacy or Health Dept. Verbalized acceptance and understanding.  Screening Tests Health Maintenance  Topic Date Due   Zoster Vaccines- Shingrix (1 of 2) Never done   COVID-19 Vaccine (4 - Booster for Pfizer series) 10/30/2020   INFLUENZA VACCINE  06/20/2022   TETANUS/TDAP  10/22/2029   Pneumonia Vaccine 51+ Years old  Completed   DEXA SCAN  Completed   HPV VACCINES  Aged Out    Health Maintenance  Health Maintenance Due  Topic Date Due   Zoster Vaccines- Shingrix (1 of 2) Never done   COVID-19 Vaccine (4 - Booster for Pfizer series) 10/30/2020   INFLUENZA VACCINE  06/20/2022    Colorectal cancer screening: No longer required.   Mammogram status: No longer required due to history of breast cancer.  Bone Density status: Completed 03/01/2022. Results reflect: Bone density results: OSTEOPOROSIS. Repeat every 2-3 years.  Lung Cancer Screening: (Low Dose CT Chest recommended if Age 56-80 years, 30 pack-year currently smoking OR have quit w/in 15years.) does not qualify.   Lung Cancer Screening Referral: no  Additional Screening:  Hepatitis C Screening: does not qualify; Completed no  Vision Screening: Recommended annual ophthalmology exams for early detection of glaucoma and other  disorders of the eye. Is the patient up to date with their annual eye exam?  No  Who is the provider or what is the name of the office in which the patient attends annual eye exams? None If pt is not established with a provider, would they like to be referred to a provider to establish care? Yes . [facility])  Dental Screening: Recommended annual dental exams for proper oral hygiene  Community Resource Referral / Chronic Care Management: CRR required this visit?  Yes   CCM required this visit?  No      Plan:     I have personally reviewed and noted the following in the patient's chart:   Medical and social history Use of alcohol, tobacco or illicit drugs  Current medications and supplements  including opioid prescriptions.  Functional ability and status Nutritional status Physical activity Advanced directives List of other physicians Hospitalizations, surgeries, and ER visits in previous 12 months Vitals Screenings to include cognitive, depression, and falls Referrals and appointments  In addition, I have reviewed and discussed with patient certain preventive protocols, quality metrics, and best practice recommendations. A written personalized care plan for preventive services as well as general preventive health recommendations were provided to patient.     Sheral Flow, LPN   01/26/7064   Nurse Notes:  Cognitive status assessed by direct observation.  Patient has current diagnosis of cognitive impairment. Patient is followed by neurology for ongoing assessment.  Patient is unable to complete screening 6CIT or MMSE.

## 2022-06-27 NOTE — Assessment & Plan Note (Signed)
Overall stable, continue family support, aricept 10 mg and  to f/u any worsening symptoms or concerns

## 2022-06-27 NOTE — Assessment & Plan Note (Signed)
Exam c/w high suspicion for GB d/o - for RUQ u/s, labs with CBC and LFTs

## 2022-06-27 NOTE — Patient Instructions (Signed)
Please continue all other medications as before, and refills have been done if requested.  Please have the pharmacy call with any other refills you may need.  Please continue your efforts at being more active, low cholesterol diet, and weight control.  Please keep your appointments with your specialists as you may have planned  You will be contacted regarding the referral for: Liver GB ultrasound (asap)  Please go to the LAB at the blood drawing area for the tests to be done  You will be contacted by phone if any changes need to be made immediately.  Otherwise, you will receive a letter about your results with an explanation, but please check with MyChart first.  Please remember to sign up for MyChart if you have not done so, as this will be important to you in the future with finding out test results, communicating by private email, and scheduling acute appointments online when needed.

## 2022-06-28 LAB — URINALYSIS, ROUTINE W REFLEX MICROSCOPIC
Bilirubin Urine: NEGATIVE
Ketones, ur: NEGATIVE
Leukocytes,Ua: NEGATIVE
Nitrite: NEGATIVE
Specific Gravity, Urine: >=1.03 — AB (ref 1.000–1.030)
Total Protein, Urine: 30 — AB
Urine Glucose: NEGATIVE
Urobilinogen, UA: 0.2 (ref 0.0–1.0)
pH: 5.5 (ref 5.0–8.0)

## 2022-06-29 ENCOUNTER — Ambulatory Visit
Admission: RE | Admit: 2022-06-29 | Discharge: 2022-06-29 | Disposition: A | Discharge status: Home / Self Care | Payer: Medicare HMO | Source: Ambulatory Visit | Attending: Internal Medicine | Admitting: Internal Medicine

## 2022-06-29 ENCOUNTER — Other Ambulatory Visit: Payer: Self-pay | Admitting: Internal Medicine

## 2022-06-29 DIAGNOSIS — K859 Acute pancreatitis without necrosis or infection, unspecified: Secondary | ICD-10-CM

## 2022-06-29 DIAGNOSIS — R1011 Right upper quadrant pain: Secondary | ICD-10-CM

## 2022-06-29 DIAGNOSIS — K7689 Other specified diseases of liver: Secondary | ICD-10-CM | POA: Diagnosis not present

## 2022-07-04 ENCOUNTER — Ambulatory Visit
Admission: RE | Admit: 2022-07-04 | Discharge: 2022-07-04 | Disposition: A | Discharge status: Home / Self Care | Payer: Medicare HMO | Source: Ambulatory Visit | Attending: Internal Medicine | Admitting: Internal Medicine

## 2022-07-04 DIAGNOSIS — N200 Calculus of kidney: Secondary | ICD-10-CM | POA: Diagnosis not present

## 2022-07-04 DIAGNOSIS — K8020 Calculus of gallbladder without cholecystitis without obstruction: Secondary | ICD-10-CM | POA: Diagnosis not present

## 2022-07-04 DIAGNOSIS — N281 Cyst of kidney, acquired: Secondary | ICD-10-CM | POA: Diagnosis not present

## 2022-07-04 DIAGNOSIS — K828 Other specified diseases of gallbladder: Secondary | ICD-10-CM | POA: Diagnosis not present

## 2022-07-04 DIAGNOSIS — K859 Acute pancreatitis without necrosis or infection, unspecified: Secondary | ICD-10-CM

## 2022-07-04 MED ORDER — IOPAMIDOL (ISOVUE-300) INJECTION 61%
100.0000 mL | Freq: Once | INTRAVENOUS | Status: AC | PRN
  Administered 2022-07-04: 100 mL via INTRAVENOUS

## 2022-07-04 NOTE — Telephone Encounter (Signed)
Error

## 2022-07-07 ENCOUNTER — Other Ambulatory Visit: Payer: Self-pay | Admitting: Neurology

## 2022-07-10 DIAGNOSIS — Z7689 Persons encountering health services in other specified circumstances: Secondary | ICD-10-CM | POA: Diagnosis not present

## 2022-07-10 DIAGNOSIS — L309 Dermatitis, unspecified: Secondary | ICD-10-CM | POA: Diagnosis not present

## 2022-07-11 ENCOUNTER — Encounter: Payer: Self-pay | Admitting: Internal Medicine

## 2022-07-12 ENCOUNTER — Telehealth: Payer: Self-pay | Admitting: Internal Medicine

## 2022-07-12 ENCOUNTER — Ambulatory Visit (INDEPENDENT_AMBULATORY_CARE_PROVIDER_SITE_OTHER): Payer: Medicare HMO

## 2022-07-12 DIAGNOSIS — I442 Atrioventricular block, complete: Secondary | ICD-10-CM

## 2022-07-12 NOTE — Telephone Encounter (Signed)
Daughter called stating patient has started on Medtronic monitoring system.

## 2022-07-13 LAB — CUP PACEART REMOTE DEVICE CHECK
Battery Remaining Longevity: 56 mo
Battery Voltage: 3.12 V
Brady Statistic AP VP Percent: 27.25 %
Brady Statistic AP VS Percent: 0.02 %
Brady Statistic AS VP Percent: 72.23 %
Brady Statistic AS VS Percent: 0.5 %
Brady Statistic RA Percent Paced: 27.42 %
Brady Statistic RV Percent Paced: 99.48 %
Date Time Interrogation Session: 20230822230838
Implantable Lead Implant Date: 19970117
Implantable Lead Implant Date: 19970117
Implantable Lead Location: 753859
Implantable Lead Location: 753860
Implantable Lead Model: 4068
Implantable Lead Model: 4068
Implantable Pulse Generator Implant Date: 20230522
Lead Channel Impedance Value: 342 Ohm
Lead Channel Impedance Value: 399 Ohm
Lead Channel Impedance Value: 475 Ohm
Lead Channel Impedance Value: 494 Ohm
Lead Channel Pacing Threshold Amplitude: 2.5 V
Lead Channel Pacing Threshold Pulse Width: 0.4 ms
Lead Channel Sensing Intrinsic Amplitude: 1.125 mV
Lead Channel Sensing Intrinsic Amplitude: 1.125 mV
Lead Channel Setting Pacing Amplitude: 2.5 V
Lead Channel Setting Pacing Amplitude: 3.5 V
Lead Channel Setting Pacing Pulse Width: 1.5 ms
Lead Channel Setting Sensing Sensitivity: 5.6 mV

## 2022-07-14 ENCOUNTER — Telehealth: Payer: Self-pay

## 2022-07-14 NOTE — Telephone Encounter (Signed)
Scheduled remote reviewed.  Atrial threshold at high output, trend runs high - route for review AP=27%  Will discuss with Dr. Caryl Comes.

## 2022-07-17 ENCOUNTER — Encounter: Payer: Self-pay | Admitting: Internal Medicine

## 2022-07-17 ENCOUNTER — Ambulatory Visit: Payer: Medicare HMO | Attending: Internal Medicine | Admitting: Internal Medicine

## 2022-07-17 VITALS — BP 131/70 | HR 59 | Ht 63.0 in | Wt 112.0 lb

## 2022-07-17 DIAGNOSIS — Z950 Presence of cardiac pacemaker: Secondary | ICD-10-CM

## 2022-07-17 DIAGNOSIS — E038 Other specified hypothyroidism: Secondary | ICD-10-CM

## 2022-07-17 DIAGNOSIS — I442 Atrioventricular block, complete: Secondary | ICD-10-CM

## 2022-07-17 NOTE — Patient Instructions (Addendum)
Medication Instructions:  Your physician recommends that you continue on your current medications as directed. Please refer to the Current Medication list given to you today.  *If you need a refill on your cardiac medications before your next appointment, please call your pharmacy*  Lab Work: YOU WILL HAVE YOUR TSH CHECKED WITH [facility] [place], [place].   [facility] at [place], [place] [address] Phone: [phone]  Testing/Procedures: None ordered.  Follow-Up:  TELEPHONE OFFICE VISIT; PATIENT OUT OF TOWN.    Important Information About Sugar

## 2022-07-17 NOTE — Telephone Encounter (Signed)
Patient seen by Dr. Caryl Comes in telephone visit. Nothing further needed at this time.

## 2022-07-17 NOTE — Progress Notes (Signed)
Electrophysiology TeleHealth Note       Date:  07/17/2022   ID:  Danielle, [name] 05-08-1936, MRN [id number]  Location: patient's home  Provider location: [address], [place]  Evaluation Performed: Follow-up visit  PCP:  Biagio Borg, MD  Cardiologist:     Electrophysiologist:  SK   Chief Complaint:      History of Present Illness:    Danielle [name] is a 86 y.o. female who presents via audio/video conferencing for a telehealth visit today.  Since last being seen in our clinic for complete heart block status post pacemaker implantation with generator replacement 2013,2023  recently moved to [place] the patient reports she is in [employer]  And loving it.  No shortness of breath no lightheadedness.  Feels better having her thyroid treated.  No subsequent TSH as to the 1 checked in April.  Date Cr K TSH Hgb  4/19     9.62    9/19 0.97 4.0   10.0  4/23   18.93   8/23 0.86 4.4  11.0         Past Medical History:  Diagnosis Date   Acute bronchitis 10/20/2008   Allergic rhinitis 01/09/2008   Alopecia 01/21/2015   Arthritis 06/11/2020   Atrioventricular block, complete 12/16/2009   Blood in urine 02/22/2017   Cardiac pacemaker in situ 01/23/2012   Cervical radiculopathy 07/11/2013   Complete atrioventricular block 12/16/2009   Degenerative joint disease of right hip 12/17/2018   Dizziness 08/03/2012   Essential hypertension 01/09/2008   Gait disorder 08/27/2021   Gastroesophageal reflux disease 01/09/2008   Generalized anxiety disorder 01/09/2008   Genetic testing 06/04/2017   Ms. Almgren underwent genetic counseling and testing for hereditary cancer syndromes on 05/24/2017. Her results were negative for mutations in all 46 genes analyzed by [facility]'s 46-gene Common Hereditary Cancers Panel. Genes analyzed include: APC, ATM, AXIN2, BARD1, BMPR1A, BRCA1, BRCA2, BRIP1, CDH1, CDKN2A, CHEK2, CTNNA1, DICER1, EPCAM, GREM1, HOXB13, KIT, MEN1,  MLH1, MSH2, MSH3, MSH6, MUTYH, NBN   History of right mastectomy 01/11/2017   History of thromboembolism of vein 01/09/2008   History of total hip arthroplasty 04/28/2019   Hyperbilirubinemia 12/16/2021   Hyperlipidemia 01/09/2008   Hypoproteinemia 12/16/2021   Hypothyroidism 01/09/2008   Impaired glucose tolerance 07/28/2011   Insomnia 06/15/2009   Left leg swelling 01/21/2015   Low back pain 04/21/2021   Major depressive disorder 01/09/2008   Malignant neoplasm of upper-inner quadrant of female breast 10/06/2016   Mild dementia 06/02/2022   Neck pain 11/04/2019   Normocytic anemia 01/09/2008   Olecranon bursitis of left elbow 05/05/2015   Osteoarthritis cervical spine 02/25/2010   Pacemaker-medtronic 01/23/2012   DOI 1990s Extraction 1997 Generator replacement 2006, 2013   Peripheral venous insufficiency 03/15/2017   Personal history of venous thrombosis and embolism 01/09/2008   Renal calculus, right 02/09/2020   Right hip pain 07/18/2016   Spondylosis 02/25/2010   Status post total hip replacement, right 11/04/2019   Subungual hematoma of foot 03/15/2017   Trigger finger of left hand 07/14/2020   Trigger finger of right hand 07/14/2020   Urinary incontinence 10/09/2019   Vitamin D deficiency 03/05/2022    Past Surgical History:  Procedure Laterality Date   BREAST BIOPSY Right 09/20/2016   malignant   ERCP W/ SPHICTEROTOMY     pt denies   HEMORRHOID SURGERY     pt denies   IR URETERAL STENT RIGHT NEW ACCESS  W/O SEP NEPHROSTOMY CATH  02/09/2020   MASTECTOMY Right 01/11/2017   MASTECTOMY W/ SENTINEL NODE BIOPSY Right 01/11/2017   Procedure: RIGHT MASTECTOMY WITH SENTINEL LYMPH NODE BIOPSY;  Surgeon: [name] Maki Hege, MD;  Location: [facility];  Service: General;  Laterality: Right;   NEPHROLITHOTOMY Right 02/09/2020   Procedure: NEPHROLITHOTOMY PERCUTANEOUS;  Surgeon: Kathie Rhodes, MD;  Location: [facility];  Service: Urology;  Laterality: Right;   PACEMAKER PLACEMENT Left     MedTronic EnPulse [id number] no heartbeat without pacemaker   PERMANENT PACEMAKER GENERATOR CHANGE N/A 07/16/2012   Procedure: PERMANENT PACEMAKER GENERATOR CHANGE;  Surgeon: Deboraha Sprang, MD;  Location: [facility] CATH LAB;  Service: Cardiovascular;  Laterality: N/A;   POLYPECTOMY     Uterine   PPM GENERATOR CHANGEOUT N/A 04/10/2022   Procedure: PPM GENERATOR CHANGEOUT;  Surgeon: Deboraha Sprang, MD;  Location: [facility] CV LAB;  Service: Cardiovascular;  Laterality: N/A;   TOTAL HIP ARTHROPLASTY Right 04/28/2019   Procedure: RIGHT TOTAL HIP ARTHROPLASTY ANTERIOR APPROACH;  Surgeon: Leandrew Koyanagi, MD;  Location: [facility];  Service: Orthopedics;  Laterality: Right;    Current Outpatient Medications  Medication Sig Dispense Refill   acetaminophen (TYLENOL) 500 MG tablet Take 500-1,000 mg by mouth every 6 (six) hours as needed for mild pain.     alendronate (FOSAMAX) 70 MG tablet Take 1 tablet (70 mg total) by mouth every 7 (seven) days. Take with a full glass of water on an empty stomach. 12 tablet 3   atenolol (TENORMIN) 50 MG tablet Take 1 tablet (50 mg total) by mouth daily. 90 tablet 3   atorvastatin (LIPITOR) 40 MG tablet Take 1 tablet (40 mg total) by mouth daily. 30 tablet 0   azelastine (ASTELIN) 0.1 % nasal spray Place 1 spray into both nostrils as needed (Summer Allergies). 30 mL 11   donepezil (ARICEPT) 10 MG tablet Take 1 tablet (10 mg total) by mouth at bedtime. 30 tablet 3   enalapril (VASOTEC) 10 MG tablet Take 1 tablet (10 mg total) by mouth daily. 90 tablet 3   gabapentin (NEURONTIN) 100 MG capsule Take 1 capsule (100 mg total) by mouth 3 (three) times daily. 270 capsule 1   levothyroxine (SYNTHROID) 112 MCG tablet Take 1 tablet (112 mcg total) by mouth daily before breakfast. 90 tablet 3   LORazepam (ATIVAN) 0.5 MG tablet Take 1 tablet (0.5 mg total) by mouth 2 (two) times daily as needed for anxiety. 60 tablet 2   Multiple Minerals-Vitamins (CAL-MAG-ZINC-D PO) Take 1 tablet by mouth in  the morning and at bedtime.     QUEtiapine (SEROQUEL) 25 MG tablet Take 1 tablet (25 mg total) by mouth 3 (three) times daily. 270 tablet 1   No current facility-administered medications for this visit.    Allergies:   Codeine and Oxycodone     ROS:  Please see the history of present illness.   All other systems are personally reviewed and negative.    Exam:    Vital Signs:  BP 131/70 Comment: pt reported  Pulse (!) 59 Comment: pt reported  Ht _0  (1.6 m)   Wt 112 lb (50.8 kg)   BMI 19.84 kg/m     Labs/Other Tests and Data Reviewed:    Recent Labs: 12/17/2021: Magnesium 2.0 02/27/2022: TSH 18.93 06/27/2022: ALT 13; BUN 27; Creatinine, Ser 0.86; Hemoglobin 11.0; Platelets 188.0; Potassium 4.4; Sodium 141   Wt Readings from Last 3 Encounters:  07/17/22 112 lb (50.8 kg)  06/27/22 108 lb (  49 kg)  06/27/22 108 lb (49 kg)     Other studies personally reviewed: Additional studies/ records that were reviewed today include: hospital records    ASSESSMENT & PLAN:    Complete heart block    Pacemaker  Medtronic     Atrial lead threshold increase  Hypothyroidism   Anemia  Continues with a high atrial pacing threshold.  Should have her device reprogrammed to try to maintain the voltage output less than 2.5 V.  Her last TSH on our records was 30; does not yet have a doctor in [place].  We will order a TSH through [facility] in [place].   COVID 19 screen The patient denies symptoms of COVID 19 at this time.  The importance of social distancing was discussed today.  Follow-up:  12 m    Current medicines are reviewed at length with the patient today.   The patient does not have concerns regarding her medicines.  The following changes were made today:  none  Labs/ tests ordered today include: TSH  No orders of the defined types were placed in this encounter.      Patient Risk:  after full review of this patients clinical status, I feel that they are at  moderate risk at this time.  Today, I have spent 11 minutes with the patient with telehealth technology discussing the above.  Signed, Virl Axe, MD  07/17/2022 2:52 PM     [facility] [address] [place] [id number] [phone] (office) [phone] (fax)

## 2022-08-08 NOTE — Progress Notes (Signed)
Remote pacemaker transmission.   

## 2022-08-10 ENCOUNTER — Telehealth: Payer: Self-pay

## 2022-08-10 ENCOUNTER — Other Ambulatory Visit: Payer: Self-pay | Admitting: Internal Medicine

## 2022-08-10 MED ORDER — LEVOTHYROXINE SODIUM 112 MCG PO TABS: 112.0000 ug | ORAL_TABLET | Freq: Every day | ORAL | 3 refills | Status: DC

## 2022-08-10 MED ORDER — LEVOTHYROXINE SODIUM 112 MCG PO TABS
112.0000 ug | ORAL_TABLET | Freq: Every day | ORAL | 3 refills | Status: DC
Start: 2022-08-10 — End: 2022-08-29

## 2022-08-10 NOTE — Telephone Encounter (Signed)
MEDICATION: levothyroxine (SYNTHROID) 112 MCG tablet  PHARMACY: [facility], [place] - [address]  Comments:   **Let patient know to contact pharmacy at the end of the day to make sure medication is ready. **  ** Please notify patient to allow 48-72 hours to process**  **Encourage patient to contact the pharmacy for refills or they can request refills through [facility]**

## 2022-08-10 NOTE — Telephone Encounter (Signed)
Rx request sent to pharmacy, patient notified. 

## 2022-08-15 ENCOUNTER — Telehealth: Payer: Self-pay

## 2022-08-15 NOTE — Telephone Encounter (Signed)
Patient's daughter called in stating Danielle [name] has moved to [place] and wants to change the appointment on the 10th to virtual. Advised that we are unable to do virtual visits if a patient is out of state, verbalized understanding and said they will bring her to [place] to do the virtual on 08/29/22.

## 2022-08-29 ENCOUNTER — Encounter: Payer: Self-pay | Admitting: Internal Medicine

## 2022-08-29 ENCOUNTER — Telehealth (INDEPENDENT_AMBULATORY_CARE_PROVIDER_SITE_OTHER): Payer: Medicare HMO | Admitting: Internal Medicine

## 2022-08-29 DIAGNOSIS — E559 Vitamin D deficiency, unspecified: Secondary | ICD-10-CM | POA: Diagnosis not present

## 2022-08-29 DIAGNOSIS — E039 Hypothyroidism, unspecified: Secondary | ICD-10-CM

## 2022-08-29 MED ORDER — LEVOTHYROXINE SODIUM 112 MCG PO TABS: 112.0000 ug | ORAL_TABLET | Freq: Every day | ORAL | 1 refills | Status: DC

## 2022-08-29 NOTE — Patient Instructions (Signed)
Please take all new medication as prescribed 

## 2022-08-29 NOTE — Assessment & Plan Note (Signed)
See notes

## 2022-08-29 NOTE — Assessment & Plan Note (Signed)
Last vitamin D Lab Results  Component Value Date   VD25OH 27.59 (L) 02/27/2022   Low, reminded to start oral replacement

## 2022-08-29 NOTE — Progress Notes (Signed)
Patient ID: Danielle [name], female   DOB: 1936-10-31, 86 y.o.   MRN: [id number]  Cumulative time during 7-day interval 12 min, there was not an associated office visit for this concern within a 7 day period.  Verbal consent for services obtained from patient prior to services given.  Names of all persons present for services: Oliver Barre, MD, patient  Chief complaint:  med review  History, background, results pertinent:  Needs thyroid med refilled; Denies hyper or hypo thyroid symptoms such as voice, skin or hair change.  Lats TSH April 2023 slightly elevated.  Pt denies chest pain, increased sob or doe, wheezing, orthopnea, PND, increased LE swelling, palpitations, dizziness or syncope.   Pt denies polydipsia, polyuria, or new focal neuro s/s.   Other meds d/w pt - I recommended taking all on her list.   Past Medical History:  Diagnosis Date   Acute bronchitis 10/20/2008   Allergic rhinitis 01/09/2008   Alopecia 01/21/2015   Arthritis 06/11/2020   Atrioventricular block, complete 12/16/2009   Blood in urine 02/22/2017   Cardiac pacemaker in situ 01/23/2012   Cervical radiculopathy 07/11/2013   Complete atrioventricular block 12/16/2009   Degenerative joint disease of right hip 12/17/2018   Dizziness 08/03/2012   Essential hypertension 01/09/2008   Gait disorder 08/27/2021   Gastroesophageal reflux disease 01/09/2008   Generalized anxiety disorder 01/09/2008   Genetic testing 06/04/2017   Ms. Linan underwent genetic counseling and testing for hereditary cancer syndromes on 05/24/2017. Her results were negative for mutations in all 46 genes analyzed by [facility]'s 46-gene Common Hereditary Cancers Panel. Genes analyzed include: APC, ATM, AXIN2, BARD1, BMPR1A, BRCA1, BRCA2, BRIP1, CDH1, CDKN2A, CHEK2, CTNNA1, DICER1, EPCAM, GREM1, HOXB13, KIT, MEN1, MLH1, MSH2, MSH3, MSH6, MUTYH, NBN   History of right mastectomy 01/11/2017   History of thromboembolism of vein 01/09/2008   History of  total hip arthroplasty 04/28/2019   Hyperbilirubinemia 12/16/2021   Hyperlipidemia 01/09/2008   Hypoproteinemia 12/16/2021   Hypothyroidism 01/09/2008   Impaired glucose tolerance 07/28/2011   Insomnia 06/15/2009   Left leg swelling 01/21/2015   Low back pain 04/21/2021   Major depressive disorder 01/09/2008   Malignant neoplasm of upper-inner quadrant of female breast 10/06/2016   Mild dementia 06/02/2022   Neck pain 11/04/2019   Normocytic anemia 01/09/2008   Olecranon bursitis of left elbow 05/05/2015   Osteoarthritis cervical spine 02/25/2010   Pacemaker-medtronic 01/23/2012   DOI 1990s Extraction 1997 Generator replacement 2006, 2013   Peripheral venous insufficiency 03/15/2017   Personal history of venous thrombosis and embolism 01/09/2008   Renal calculus, right 02/09/2020   Right hip pain 07/18/2016   Spondylosis 02/25/2010   Status post total hip replacement, right 11/04/2019   Subungual hematoma of foot 03/15/2017   Trigger finger of left hand 07/14/2020   Trigger finger of right hand 07/14/2020   Urinary incontinence 10/09/2019   Vitamin D deficiency 03/05/2022   No results found for this or any previous visit (from the past 48 hour(s)).  Lab Results  Component Value Date   WBC 5.6 06/27/2022   HGB 11.0 (L) 06/27/2022   HCT 32.4 (L) 06/27/2022   PLT 188.0 06/27/2022   GLUCOSE 90 06/27/2022   CHOL 263 (H) 02/27/2022   TRIG 288.0 (H) 02/27/2022   HDL 103.10 02/27/2022   LDLDIRECT 135.0 02/27/2022   LDLCALC 68 11/22/2021   ALT 13 06/27/2022   AST 20 06/27/2022   NA 141 06/27/2022   K 4.4 06/27/2022   CL 107 06/27/2022  CREATININE 0.86 06/27/2022   BUN 27 (H) 06/27/2022   CO2 26 06/27/2022   TSH 18.93 (H) 02/27/2022   INR 1.0 02/09/2020   HGBA1C 5.9 02/27/2022    A/P/next steps:   Hypothyroidism - ok for refill, but also will need lab testing at some point in about 3 mo.  Pt states she can fly from [place] yo have this done here.    Low Vit D - reminded  to start Vit D3 2000 u qd  Cathlean Cower MD

## 2022-09-02 ENCOUNTER — Other Ambulatory Visit: Payer: Self-pay | Admitting: Internal Medicine

## 2022-09-13 ENCOUNTER — Other Ambulatory Visit: Payer: Self-pay | Admitting: Internal Medicine

## 2022-10-02 ENCOUNTER — Other Ambulatory Visit: Payer: Self-pay | Admitting: Neurology

## 2022-10-03 ENCOUNTER — Other Ambulatory Visit: Payer: Self-pay

## 2022-10-03 MED ORDER — DONEPEZIL HCL 10 MG PO TABS: 10.0000 mg | ORAL_TABLET | Freq: Every day | ORAL | 3 refills | Status: DC

## 2022-10-24 DIAGNOSIS — E785 Hyperlipidemia, unspecified: Secondary | ICD-10-CM | POA: Diagnosis not present

## 2022-10-24 DIAGNOSIS — R5383 Other fatigue: Secondary | ICD-10-CM | POA: Diagnosis not present

## 2022-10-24 DIAGNOSIS — E119 Type 2 diabetes mellitus without complications: Secondary | ICD-10-CM | POA: Diagnosis not present

## 2022-10-27 DIAGNOSIS — E7841 Elevated Lipoprotein(a): Secondary | ICD-10-CM | POA: Diagnosis not present

## 2022-10-27 DIAGNOSIS — G300 Alzheimer's disease with early onset: Secondary | ICD-10-CM | POA: Diagnosis not present

## 2022-10-27 DIAGNOSIS — M810 Age-related osteoporosis without current pathological fracture: Secondary | ICD-10-CM | POA: Diagnosis not present

## 2022-10-27 DIAGNOSIS — D6489 Other specified anemias: Secondary | ICD-10-CM | POA: Diagnosis not present

## 2022-10-27 DIAGNOSIS — E034 Atrophy of thyroid (acquired): Secondary | ICD-10-CM | POA: Diagnosis not present

## 2022-10-27 DIAGNOSIS — I10 Essential (primary) hypertension: Secondary | ICD-10-CM | POA: Diagnosis not present

## 2022-11-01 ENCOUNTER — Encounter: Payer: Self-pay | Admitting: Internal Medicine

## 2022-11-29 ENCOUNTER — Encounter: Payer: Self-pay | Admitting: Internal Medicine

## 2022-11-29 ENCOUNTER — Telehealth: Payer: Self-pay | Admitting: Neurology

## 2022-11-29 NOTE — Telephone Encounter (Signed)
Due to dx ( Memory loss). Appt would be better face to face. We are unable to do telephone visits at this time.   Please see if pt would like to keep appt or reschedule to another date that is agreeable for her, thanks!

## 2022-11-29 NOTE — Telephone Encounter (Signed)
I do not know any Neurologist in that area. What I would recommend is for patient to set up care with a primary care doctor who will refer her to Neurology.  At that time, she will need to sign a release of information and all her notes will be sent there.   Thanks

## 2022-11-29 NOTE — Telephone Encounter (Signed)
Patient is needing a letter specifying the need for grab bars but it sounds as though she may be in another state so I don't know if a letter is appropriate. Please advise if possible, patient had VV 08/29/22 and LOV 06/27/22

## 2022-11-29 NOTE — Telephone Encounter (Signed)
Ok I can do letter

## 2022-11-29 NOTE — Telephone Encounter (Signed)
Pt would like to know if her visit 12/06/2022 can be scheduled as a mychart video visit or over the phone. Pt is out of town for a few weeks.

## 2022-11-29 NOTE — Telephone Encounter (Signed)
Pt has moved to be with her daughter in [place], [facility]. Pt daughter would like a referral or recommendation of a neurologic doctor in her area if possible.

## 2022-12-04 ENCOUNTER — Other Ambulatory Visit: Payer: Self-pay

## 2022-12-04 MED ORDER — ALENDRONATE SODIUM 70 MG PO TABS: 70.0000 mg | ORAL_TABLET | ORAL | 3 refills | Status: DC

## 2022-12-04 MED ORDER — ENALAPRIL MALEATE 10 MG PO TABS: 10.0000 mg | ORAL_TABLET | Freq: Every day | ORAL | 3 refills | Status: DC

## 2022-12-04 MED ORDER — ATENOLOL 50 MG PO TABS
50.0000 mg | ORAL_TABLET | Freq: Every day | ORAL | 3 refills | Status: DC
Start: 2022-12-04 — End: 2024-04-07

## 2022-12-06 ENCOUNTER — Ambulatory Visit: Payer: Medicare HMO | Admitting: Neurology

## 2022-12-06 ENCOUNTER — Encounter: Payer: Self-pay | Admitting: Neurology

## 2022-12-26 DIAGNOSIS — I10 Essential (primary) hypertension: Secondary | ICD-10-CM | POA: Diagnosis not present

## 2022-12-26 DIAGNOSIS — Z950 Presence of cardiac pacemaker: Secondary | ICD-10-CM | POA: Diagnosis not present

## 2022-12-29 ENCOUNTER — Encounter: Payer: Self-pay | Admitting: Neurology

## 2023-01-01 NOTE — Telephone Encounter (Signed)
Yes, which Pharmacy should we send it.

## 2023-01-03 DIAGNOSIS — I10 Essential (primary) hypertension: Secondary | ICD-10-CM | POA: Diagnosis not present

## 2023-01-24 ENCOUNTER — Ambulatory Visit: Payer: Medicare HMO

## 2023-01-24 ENCOUNTER — Telehealth: Payer: Self-pay

## 2023-01-24 DIAGNOSIS — Z950 Presence of cardiac pacemaker: Secondary | ICD-10-CM | POA: Diagnosis not present

## 2023-01-24 DIAGNOSIS — I442 Atrioventricular block, complete: Secondary | ICD-10-CM | POA: Diagnosis not present

## 2023-01-24 NOTE — Telephone Encounter (Signed)
Scheduled remote reviewed. Normal device function.   Known RA elevated threshold, pt. has moved, was going to established EP care Last remote August - route to triage to assess above need Next remote 91 days  LVM on daughter cell phone to follow up if patient has been able to establish with EP MD in [place]

## 2023-01-25 LAB — CUP PACEART REMOTE DEVICE CHECK
Battery Remaining Longevity: 53 mo
Battery Voltage: 3 V
Brady Statistic AP VP Percent: 65.36 %
Brady Statistic AP VS Percent: 0.09 %
Brady Statistic AS VP Percent: 33.74 %
Brady Statistic AS VS Percent: 0.81 %
Brady Statistic RA Percent Paced: 65.86 %
Brady Statistic RV Percent Paced: 99.1 %
Date Time Interrogation Session: 20240306114419
Implantable Lead Connection Status: 753985
Implantable Lead Connection Status: 753985
Implantable Lead Implant Date: 19970117
Implantable Lead Implant Date: 19970117
Implantable Lead Location: 753859
Implantable Lead Location: 753860
Implantable Lead Model: 4068
Implantable Lead Model: 4068
Implantable Pulse Generator Implant Date: 20230522
Lead Channel Impedance Value: 380 Ohm
Lead Channel Impedance Value: 437 Ohm
Lead Channel Impedance Value: 513 Ohm
Lead Channel Impedance Value: 532 Ohm
Lead Channel Pacing Threshold Amplitude: 2.5 V
Lead Channel Pacing Threshold Pulse Width: 0.4 ms
Lead Channel Sensing Intrinsic Amplitude: 1.375 mV
Lead Channel Sensing Intrinsic Amplitude: 1.375 mV
Lead Channel Setting Pacing Amplitude: 2.5 V
Lead Channel Setting Pacing Amplitude: 3.5 V
Lead Channel Setting Pacing Pulse Width: 1.5 ms
Lead Channel Setting Sensing Sensitivity: 5.6 mV
Zone Setting Status: 755011
Zone Setting Status: 755011

## 2023-01-26 NOTE — Telephone Encounter (Signed)
Spoke with patient's daughter she stated that patient does have EP MD in [place] that checked her pacemaker however that practice does not remote monitor patients. Patients daughter stated that patient is planning on moving back to [place] in a year or so.

## 2023-01-29 ENCOUNTER — Other Ambulatory Visit: Payer: Self-pay

## 2023-01-29 MED ORDER — DONEPEZIL HCL 10 MG PO TABS: 10.0000 mg | ORAL_TABLET | Freq: Every day | ORAL | 3 refills | Status: DC

## 2023-01-29 NOTE — Progress Notes (Signed)
Done. Thanks.

## 2023-02-17 ENCOUNTER — Encounter: Payer: Self-pay | Admitting: Internal Medicine

## 2023-02-23 DIAGNOSIS — G300 Alzheimer's disease with early onset: Secondary | ICD-10-CM | POA: Diagnosis not present

## 2023-02-23 DIAGNOSIS — E034 Atrophy of thyroid (acquired): Secondary | ICD-10-CM | POA: Diagnosis not present

## 2023-02-23 DIAGNOSIS — M810 Age-related osteoporosis without current pathological fracture: Secondary | ICD-10-CM | POA: Diagnosis not present

## 2023-02-23 DIAGNOSIS — I10 Essential (primary) hypertension: Secondary | ICD-10-CM | POA: Diagnosis not present

## 2023-02-28 NOTE — Progress Notes (Signed)
Remote pacemaker transmission.   

## 2023-03-05 DIAGNOSIS — I10 Essential (primary) hypertension: Secondary | ICD-10-CM | POA: Diagnosis not present

## 2023-03-05 DIAGNOSIS — E034 Atrophy of thyroid (acquired): Secondary | ICD-10-CM | POA: Diagnosis not present

## 2023-04-17 ENCOUNTER — Ambulatory Visit (INDEPENDENT_AMBULATORY_CARE_PROVIDER_SITE_OTHER): Payer: Medicare HMO | Admitting: Internal Medicine

## 2023-04-17 ENCOUNTER — Encounter: Payer: Self-pay | Admitting: Internal Medicine

## 2023-04-17 VITALS — BP 132/90 | HR 65 | Temp 97.8°F | Ht 63.0 in | Wt 114.0 lb

## 2023-04-17 DIAGNOSIS — R7302 Impaired glucose tolerance (oral): Secondary | ICD-10-CM | POA: Diagnosis not present

## 2023-04-17 DIAGNOSIS — R739 Hyperglycemia, unspecified: Secondary | ICD-10-CM

## 2023-04-17 DIAGNOSIS — E7800 Pure hypercholesterolemia, unspecified: Secondary | ICD-10-CM

## 2023-04-17 DIAGNOSIS — F03A4 Unspecified dementia, mild, with anxiety: Secondary | ICD-10-CM

## 2023-04-17 DIAGNOSIS — E538 Deficiency of other specified B group vitamins: Secondary | ICD-10-CM | POA: Diagnosis not present

## 2023-04-17 DIAGNOSIS — Z0001 Encounter for general adult medical examination with abnormal findings: Secondary | ICD-10-CM

## 2023-04-17 DIAGNOSIS — H9193 Unspecified hearing loss, bilateral: Secondary | ICD-10-CM

## 2023-04-17 DIAGNOSIS — E559 Vitamin D deficiency, unspecified: Secondary | ICD-10-CM

## 2023-04-17 DIAGNOSIS — M79605 Pain in left leg: Secondary | ICD-10-CM

## 2023-04-17 DIAGNOSIS — E039 Hypothyroidism, unspecified: Secondary | ICD-10-CM

## 2023-04-17 DIAGNOSIS — I10 Essential (primary) hypertension: Secondary | ICD-10-CM

## 2023-04-17 LAB — BASIC METABOLIC PANEL
BUN: 27 mg/dL — ABNORMAL HIGH (ref 6–23)
CO2: 28 mEq/L (ref 19–32)
Calcium: 10.1 mg/dL (ref 8.4–10.5)
Chloride: 102 mEq/L (ref 96–112)
Creatinine, Ser: 1 mg/dL (ref 0.40–1.20)
GFR: 51.42 mL/min — ABNORMAL LOW (ref >60.00)
Glucose, Bld: 90 mg/dL (ref 70–99)
Potassium: 4.5 mEq/L (ref 3.5–5.1)
Sodium: 139 mEq/L (ref 135–145)

## 2023-04-17 LAB — HEPATIC FUNCTION PANEL
ALT: 19 U/L (ref 0–35)
AST: 25 U/L (ref 0–37)
Albumin: 4.7 g/dL (ref 3.5–5.2)
Alkaline Phosphatase: 54 U/L (ref 39–117)
Bilirubin, Direct: 0.2 mg/dL (ref 0.0–0.3)
Total Bilirubin: 1.1 mg/dL (ref 0.2–1.2)
Total Protein: 7.1 g/dL (ref 6.0–8.3)

## 2023-04-17 LAB — CBC WITH DIFFERENTIAL/PLATELET
Basophils Absolute: 0.1 10*3/uL (ref 0.0–0.1)
Basophils Relative: 1.1 % (ref 0.0–3.0)
Eosinophils Absolute: 0.1 10*3/uL (ref 0.0–0.7)
Eosinophils Relative: 2 % (ref 0.0–5.0)
HCT: 34.8 % — ABNORMAL LOW (ref 36.0–46.0)
Hemoglobin: 11.7 g/dL — ABNORMAL LOW (ref 12.0–15.0)
Lymphocytes Relative: 25.1 % (ref 12.0–46.0)
Lymphs Abs: 1.9 10*3/uL (ref 0.7–4.0)
MCHC: 33.7 g/dL (ref 30.0–36.0)
MCV: 92.9 fl (ref 78.0–100.0)
Monocytes Absolute: 0.6 10*3/uL (ref 0.1–1.0)
Monocytes Relative: 8.7 % (ref 3.0–12.0)
Neutro Abs: 4.7 10*3/uL (ref 1.4–7.7)
Neutrophils Relative %: 63.1 % (ref 43.0–77.0)
Platelets: 223 10*3/uL (ref 150.0–400.0)
RBC: 3.75 Mil/uL — ABNORMAL LOW (ref 3.87–5.11)
RDW: 15 % (ref 11.5–15.5)
WBC: 7.4 10*3/uL (ref 4.0–10.5)

## 2023-04-17 LAB — LIPID PANEL
Cholesterol: 180 mg/dL (ref 0–200)
HDL: 89 mg/dL (ref >39.00)
LDL Cholesterol: 75 mg/dL (ref 0–99)
NonHDL: 90.64
Total CHOL/HDL Ratio: 2
Triglycerides: 78 mg/dL (ref 0.0–149.0)
VLDL: 15.6 mg/dL (ref 0.0–40.0)

## 2023-04-17 LAB — MICROALBUMIN / CREATININE URINE RATIO
Creatinine,U: 215.9 mg/dL
Microalb Creat Ratio: 39.8 mg/g — ABNORMAL HIGH (ref 0.0–30.0)
Microalb, Ur: 85.8 mg/dL — ABNORMAL HIGH (ref 0.0–1.9)

## 2023-04-17 LAB — HEMOGLOBIN A1C: Hgb A1c MFr Bld: 5.5 % (ref 4.6–6.5)

## 2023-04-17 LAB — VITAMIN B12: Vitamin B-12: >1500 pg/mL — ABNORMAL HIGH (ref 211–911)

## 2023-04-17 LAB — VITAMIN D 25 HYDROXY (VIT D DEFICIENCY, FRACTURES): VITD: 26.49 ng/mL — ABNORMAL LOW (ref 30.00–100.00)

## 2023-04-17 LAB — TSH: TSH: 3.86 u[IU]/mL (ref 0.35–5.50)

## 2023-04-17 NOTE — Assessment & Plan Note (Signed)
Can't r/o pad and claudication - for arterial dopplers

## 2023-04-17 NOTE — Assessment & Plan Note (Signed)
BP Readings from Last 3 Encounters:  04/17/23 (!) 132/90  07/17/22 131/70  06/27/22 136/72   Stable, pt to continue medical treatment tenormiin 50 qd, vasotec 10 qd

## 2023-04-17 NOTE — Assessment & Plan Note (Signed)
Likely age related sensorineural - for audiology exam

## 2023-04-17 NOTE — Patient Instructions (Signed)
Ok to hold the seroquel to see if helps with the fatigue during the day  Please continue all other medications as before, and refills have been done if requested.  Please have the pharmacy call with any other refills you may need.  Please continue your efforts at being more active, low cholesterol diet, and weight control.  You are otherwise up to date with prevention measures today.  Please keep your appointments with your specialists as you may have planned  You will be contacted regarding the referral for: Audiology, and the leg circulation testing  Please go to the LAB at the blood drawing area for the tests to be done  You will be contacted by phone if any changes need to be made immediately.  Otherwise, you will receive a letter about your results with an explanation, but please check with MyChart first.  Please remember to sign up for MyChart if you have not done so, as this will be important to you in the future with finding out test results, communicating by private email, and scheduling acute appointments online when needed.  Please make an Appointment to return in 6 months, or sooner if needed

## 2023-04-17 NOTE — Assessment & Plan Note (Addendum)
Lab Results  Component Value Date   TSH 18.93 (H) 02/27/2022   Uncontrolled, with recent good med compliance, pt to continue levothyroxie 112, mcg qd, for f/u lab

## 2023-04-17 NOTE — Progress Notes (Signed)
Patient ID: Danielle [name], female   DOB: [birth year], 87 y.o.   MRN: [id number]         Chief Complaint:: wellness exam and Medical Management of Chronic Issues (Follow up and would like a referral to get her ears checked )  With bilateral hearing loss,  low vit d, dementia, low thyroid, hld, hyperglycemia       HPI:  Danielle [name] is a 87 y.o. female here for wellness exam; for shingrix at pharmacy, o/w up to date                        Also Pt denies chest pain, increased sob or doe, wheezing, orthopnea, PND, increased LE swelling, palpitations, dizziness or syncope.   Pt denies polydipsia, polyuria, or new focal neuro s/s.    Pt denies fever, wt loss, night sweats, loss of appetite, or other constitutional symptoms  Dementia overall stable symptomatically, and not assoc with behavioral changes such as hallucinations, paranoia, or agitation.  Denies hyper or hypo thyroid symptoms such as voice, skin or hair change.  Does have recurring distal LLE pain below the knee with walking for several months.  Daughter states when  pt take all meds with perfect compliance, she seems to be more fatigued and spends a lot the day lying down.  Also has worsening bilateral hearing.     Wt Readings from Last 3 Encounters:  04/17/23 114 lb (51.7 kg)  07/17/22 112 lb (50.8 kg)  06/27/22 108 lb (49 kg)   BP Readings from Last 3 Encounters:  04/17/23 (!) 132/90  07/17/22 131/70  06/27/22 136/72   Immunization History  Administered Date(s) Administered   Fluad Quad(high Dose 65+) 09/11/2019, 08/26/2021   Influenza Split 10/31/2011, 08/02/2012   Influenza Whole 09/02/2008   Influenza, High Dose Seasonal PF 12/15/2016, 12/17/2018   PFIZER(Purple Top)SARS-COV-2 Vaccination 03/02/2020, 03/23/2020, 09/04/2020   Pneumococcal Conjugate-13 12/18/2014   Pneumococcal Polysaccharide-23 01/19/2004, 09/11/2019   Td 09/02/2008   Tdap 10/23/2019   Zoster, Live 12/21/2006   Health Maintenance Due  Topic Date  Due   Zoster Vaccines- Shingrix (1 of 2) Never done      Past Medical History:  Diagnosis Date   Acute bronchitis 10/20/2008   Allergic rhinitis 01/09/2008   Alopecia 01/21/2015   Arthritis 06/11/2020   Atrioventricular block, complete 12/16/2009   Blood in urine 02/22/2017   Cardiac pacemaker in situ 01/23/2012   Cervical radiculopathy 07/11/2013   Complete atrioventricular block 12/16/2009   Degenerative joint disease of right hip 12/17/2018   Dizziness 08/03/2012   Essential hypertension 01/09/2008   Gait disorder 08/27/2021   Gastroesophageal reflux disease 01/09/2008   Generalized anxiety disorder 01/09/2008   Genetic testing 06/04/2017   Danielle [name] underwent genetic counseling and testing for hereditary cancer syndromes on 05/24/2017. Her results were negative for mutations in all 46 genes analyzed by [facility]'s 46-gene Common Hereditary Cancers Panel. Genes analyzed include: APC, ATM, AXIN2, BARD1, BMPR1A, BRCA1, BRCA2, BRIP1, CDH1, CDKN2A, CHEK2, CTNNA1, DICER1, EPCAM, GREM1, HOXB13, KIT, MEN1, MLH1, MSH2, MSH3, MSH6, MUTYH, NBN   History of right mastectomy 01/11/2017   History of thromboembolism of vein 01/09/2008   History of total hip arthroplasty 04/28/2019   Hyperbilirubinemia 12/16/2021   Hyperlipidemia 01/09/2008   Hypoproteinemia 12/16/2021   Hypothyroidism 01/09/2008   Impaired glucose tolerance 07/28/2011   Insomnia 06/15/2009   Left leg swelling 01/21/2015   Low back pain 04/21/2021   Major depressive disorder 01/09/2008   Malignant  neoplasm of upper-inner quadrant of female breast 10/06/2016   Mild dementia 06/02/2022   Neck pain 11/04/2019   Normocytic anemia 01/09/2008   Olecranon bursitis of left elbow 05/05/2015   Osteoarthritis cervical spine 02/25/2010   Pacemaker-medtronic 01/23/2012   DOI 1990s Extraction 1997 Generator replacement 2006, 2013   Peripheral venous insufficiency 03/15/2017   Personal history of venous thrombosis and embolism  01/09/2008   Renal calculus, right 02/09/2020   Right hip pain 07/18/2016   Spondylosis 02/25/2010   Status post total hip replacement, right 11/04/2019   Subungual hematoma of foot 03/15/2017   Trigger finger of left hand 07/14/2020   Trigger finger of right hand 07/14/2020   Urinary incontinence 10/09/2019   Vitamin D deficiency 03/05/2022   Past Surgical History:  Procedure Laterality Date   BREAST BIOPSY Right 09/20/2016   malignant   ERCP W/ SPHICTEROTOMY     pt denies   HEMORRHOID SURGERY     pt denies   IR URETERAL STENT RIGHT NEW ACCESS W/O SEP NEPHROSTOMY CATH  02/09/2020   MASTECTOMY Right 01/11/2017   MASTECTOMY W/ SENTINEL NODE BIOPSY Right 01/11/2017   Procedure: RIGHT MASTECTOMY WITH SENTINEL LYMPH NODE BIOPSY;  Surgeon: Almond Lint, MD;  Location: [facility] OR;  Service: General;  Laterality: Right;   NEPHROLITHOTOMY Right 02/09/2020   Procedure: NEPHROLITHOTOMY PERCUTANEOUS;  Surgeon: Ihor Gully, MD;  Location: [facility] ORS;  Service: Urology;  Laterality: Right;   PACEMAKER PLACEMENT Left    MedTronic EnPulse [id number] no heartbeat without pacemaker   PERMANENT PACEMAKER GENERATOR CHANGE N/A 07/16/2012   Procedure: PERMANENT PACEMAKER GENERATOR CHANGE;  Surgeon: Duke Salvia, MD;  Location: [facility] CATH LAB;  Service: Cardiovascular;  Laterality: N/A;   POLYPECTOMY     Uterine   PPM GENERATOR CHANGEOUT N/A 04/10/2022   Procedure: PPM GENERATOR CHANGEOUT;  Surgeon: Duke Salvia, MD;  Location: [facility] INVASIVE CV LAB;  Service: Cardiovascular;  Laterality: N/A;   TOTAL HIP ARTHROPLASTY Right 04/28/2019   Procedure: RIGHT TOTAL HIP ARTHROPLASTY ANTERIOR APPROACH;  Surgeon: Tarry Kos, MD;  Location: [facility] OR;  Service: Orthopedics;  Laterality: Right;    reports that she quit smoking about 51 years ago. Her smoking use included cigarettes. She has a 5.00 pack-year smoking history. She has never used smokeless tobacco. She reports current alcohol use. She reports that she does not use  drugs. family history includes Breast cancer in her maternal aunt; Breast cancer (age of onset: 35) in her mother; Lung cancer in her paternal grandfather; Parkinson's disease in her brother; Pulmonary embolism in her father; Schizophrenia in her brother; Sudden death in her mother. Allergies  Allergen Reactions   Codeine Nausea And Vomiting    Pt is unaware of reaction    Oxycodone Nausea And Vomiting    Pt is unaware of reaction    Current Outpatient Medications on File Prior to Visit  Medication Sig Dispense Refill   acetaminophen (TYLENOL) 500 MG tablet Take 500-1,000 mg by mouth every 6 (six) hours as needed for mild pain.     alendronate (FOSAMAX) 70 MG tablet Take 1 tablet (70 mg total) by mouth every 7 (seven) days. Take with a full glass of water on an empty stomach. 12 tablet 3   atenolol (TENORMIN) 50 MG tablet Take 1 tablet (50 mg total) by mouth daily. 90 tablet 3   atorvastatin (LIPITOR) 40 MG tablet Take 1 tablet (40 mg total) by mouth daily. 30 tablet 0   azelastine (ASTELIN) 0.1 % nasal spray  Place 1 spray into both nostrils as needed (Summer Allergies). 30 mL 11   donepezil (ARICEPT) 10 MG tablet Take 1 tablet (10 mg total) by mouth at bedtime. 90 tablet 3   enalapril (VASOTEC) 10 MG tablet Take 1 tablet (10 mg total) by mouth daily. 90 tablet 3   gabapentin (NEURONTIN) 100 MG capsule Take 1 capsule (100 mg total) by mouth 3 (three) times daily. 270 capsule 1   levothyroxine (SYNTHROID) 112 MCG tablet Take 1 tablet (112 mcg total) by mouth daily before breakfast. 90 tablet 1   LORazepam (ATIVAN) 0.5 MG tablet Take 1 tablet (0.5 mg total) by mouth 2 (two) times daily as needed for anxiety. 60 tablet 2   Multiple Minerals-Vitamins (CAL-MAG-ZINC-D PO) Take 1 tablet by mouth in the morning and at bedtime.     QUEtiapine (SEROQUEL) 25 MG tablet Take 1 tablet by mouth three times daily. 270 tablet 1   No current facility-administered medications on file prior to visit.         ROS:  All others reviewed and negative.  Objective        PE:  BP (!) 132/90 (BP Location: Right Arm, Patient Position: Sitting, Cuff Size: Normal)   Pulse 65   Temp 97.8 F (36.6 C) (Oral)   Ht 5\' 3"  (1.6 m)   Wt 114 lb (51.7 kg)   SpO2 98%   BMI 20.19 kg/m                 Constitutional: Pt appears in NAD               HENT: Head: NCAT.                Right Ear: External ear normal.                 Left Ear: External ear normal.                Eyes: . Pupils are equal, round, and reactive to light. Conjunctivae and EOM are normal               Nose: without d/c or deformity               Neck: Neck supple. Gross normal ROM               Cardiovascular: Normal rate and regular rhythm.                 Pulmonary/Chest: Effort normal and breath sounds without rales or wheezing.                Abd:  Soft, NT, ND, + BS, no organomegaly               Neurological: Pt is alert. At baseline orientation, motor grossly intact               Skin: Skin is warm. No rashes, no other new lesions, LE edema - none               Psychiatric: Pt behavior is normal without agitation   Micro: none  Cardiac tracings I have personally interpreted today:  none  Pertinent Radiological findings (summarize): none   Lab Results  Component Value Date   WBC 5.6 06/27/2022   HGB 11.0 (L) 06/27/2022   HCT 32.4 (L) 06/27/2022   PLT 188.0 06/27/2022   GLUCOSE 90 06/27/2022   CHOL 263 (H) 02/27/2022   TRIG 288.0 (H)  02/27/2022   HDL 103.10 02/27/2022   LDLDIRECT 135.0 02/27/2022   LDLCALC 68 11/22/2021   ALT 13 06/27/2022   AST 20 06/27/2022   NA 141 06/27/2022   K 4.4 06/27/2022   CL 107 06/27/2022   CREATININE 0.86 06/27/2022   BUN 27 (H) 06/27/2022   CO2 26 06/27/2022   TSH 18.93 (H) 02/27/2022   INR 1.0 02/09/2020   HGBA1C 5.9 02/27/2022   Assessment/Plan:  Danielle [name] is a 87 y.o. White or Caucasian [1] female with  has a past medical history of Acute bronchitis (10/20/2008),  Allergic rhinitis (01/09/2008), Alopecia (01/21/2015), Arthritis (06/11/2020), Atrioventricular block, complete (12/16/2009), Blood in urine (02/22/2017), Cardiac pacemaker in situ (01/23/2012), Cervical radiculopathy (07/11/2013), Complete atrioventricular block (12/16/2009), Degenerative joint disease of right hip (12/17/2018), Dizziness (08/03/2012), Essential hypertension (01/09/2008), Gait disorder (08/27/2021), Gastroesophageal reflux disease (01/09/2008), Generalized anxiety disorder (01/09/2008), Genetic testing (06/04/2017), History of right mastectomy (01/11/2017), History of thromboembolism of vein (01/09/2008), History of total hip arthroplasty (04/28/2019), Hyperbilirubinemia (12/16/2021), Hyperlipidemia (01/09/2008), Hypoproteinemia (12/16/2021), Hypothyroidism (01/09/2008), Impaired glucose tolerance (07/28/2011), Insomnia (06/15/2009), Left leg swelling (01/21/2015), Low back pain (04/21/2021), Major depressive disorder (01/09/2008), Malignant neoplasm of upper-inner quadrant of female breast (10/06/2016), Mild dementia (06/02/2022), Neck pain (11/04/2019), Normocytic anemia (01/09/2008), Olecranon bursitis of left elbow (05/05/2015), Osteoarthritis cervical spine (02/25/2010), Pacemaker-medtronic (01/23/2012), Peripheral venous insufficiency (03/15/2017), Personal history of venous thrombosis and embolism (01/09/2008), Renal calculus, right (02/09/2020), Right hip pain (07/18/2016), Spondylosis (02/25/2010), Status post total hip replacement, right (11/04/2019), Subungual hematoma of foot (03/15/2017), Trigger finger of left hand (07/14/2020), Trigger finger of right hand (07/14/2020), Urinary incontinence (10/09/2019), and Vitamin D deficiency (03/05/2022).  Encounter for well adult exam with abnormal findings Age and sex appropriate education and counseling updated with regular exercise and diet Referrals for preventative services - none needed Immunizations addressed - for shingrix at the  pharmacy Smoking counseling  - none needed Evidence for depression or other mood disorder - none significant Most recent labs reviewed. I have personally reviewed and have noted: 1) the patient's medical and social history 2) The patient's current medications and supplements 3) The patient's height, weight, and BMI have been recorded in the chart   Essential hypertension BP Readings from Last 3 Encounters:  04/17/23 (!) 132/90  07/17/22 131/70  06/27/22 136/72   Stable, pt to continue medical treatment tenormiin 50 qd, vasotec 10 qd   Vitamin D deficiency Last vitamin D Lab Results  Component Value Date   VD25OH 27.59 (L) 02/27/2022   Low, to start oral replacement   Mild dementia Stable overall but suspect seroquel may be causing fatigue and staying in bed - ok to hold seroquel to assess  Hypothyroidism Lab Results  Component Value Date   TSH 18.93 (H) 02/27/2022   Uncontrolled, with recent good med compliance, pt to continue levothyroxie 112, mcg qd, for f/u lab     Hyperlipidemia Lab Results  Component Value Date   LDLCALC 68 11/22/2021   Stable, pt to continue current statin lipitor 40 qd   Impaired glucose tolerance Lab Results  Component Value Date   HGBA1C 5.9 02/27/2022   Stable, pt to continue current medical treatment  - diet, wt control   Left leg pain Can't r/o pad and claudication - for arterial dopplers  Bilateral hearing loss Likely age related sensorineural - for audiology exam Followup: Return in about 6 months (around 10/18/2023).  Oliver Barre, MD 04/17/2023 8:26 PM [facility]

## 2023-04-17 NOTE — Assessment & Plan Note (Signed)
Lab Results  Component Value Date   HGBA1C 5.9 02/27/2022   Stable, pt to continue current medical treatment  - diet, wt control  

## 2023-04-17 NOTE — Assessment & Plan Note (Signed)
Last vitamin D ?Lab Results  ?Component Value Date  ? VD25OH 27.59 (L) 02/27/2022  ? ?Low, to start oral replacement ? ?

## 2023-04-17 NOTE — Assessment & Plan Note (Signed)
Lab Results  Component Value Date   LDLCALC 68 11/22/2021   Stable, pt to continue current statin lipitor 40 qd

## 2023-04-17 NOTE — Assessment & Plan Note (Signed)
Age and sex appropriate education and counseling updated with regular exercise and diet Referrals for preventative services - none needed Immunizations addressed - for shingrix at the pharmacy Smoking counseling  - none needed Evidence for depression or other mood disorder - none significant Most recent labs reviewed. I have personally reviewed and have noted: 1) the patient's medical and social history 2) The patient's current medications and supplements 3) The patient's height, weight, and BMI have been recorded in the chart  

## 2023-04-17 NOTE — Assessment & Plan Note (Signed)
Stable overall but suspect seroquel may be causing fatigue and staying in bed - ok to hold seroquel to assess

## 2023-04-18 LAB — URINALYSIS, ROUTINE W REFLEX MICROSCOPIC
Leukocytes,Ua: NEGATIVE
Nitrite: NEGATIVE
Specific Gravity, Urine: >=1.03 — AB (ref 1.000–1.030)
Total Protein, Urine: >=300 — AB
Urine Glucose: NEGATIVE
Urobilinogen, UA: 0.2 (ref 0.0–1.0)
pH: 5.5 (ref 5.0–8.0)

## 2023-04-18 NOTE — Progress Notes (Signed)
The test results show that your current treatment is OK, as the tests are stable, except the Vitamin D level is mildly low.  Please take OTC Vitamin D3 at 2000 units per day, indefinitely, or 4000 units if you already take the 2000 units.  Otherwise,.  Please continue the same plan.  There is no other need for change of treatment or further evaluation based on these results, at this time.  thanks

## 2023-04-25 ENCOUNTER — Ambulatory Visit: Payer: Medicare HMO

## 2023-04-25 ENCOUNTER — Ambulatory Visit: Payer: Medicare HMO | Admitting: Audiologist

## 2023-05-02 ENCOUNTER — Ambulatory Visit (HOSPITAL_COMMUNITY): Payer: Medicare HMO

## 2023-05-03 ENCOUNTER — Ambulatory Visit: Payer: Medicare HMO | Admitting: Audiologist

## 2023-05-28 ENCOUNTER — Ambulatory Visit: Payer: Medicare HMO | Attending: Internal Medicine | Admitting: Audiologist

## 2023-05-28 DIAGNOSIS — H903 Sensorineural hearing loss, bilateral: Secondary | ICD-10-CM | POA: Diagnosis not present

## 2023-05-28 NOTE — Procedures (Signed)
  Outpatient Audiology and [facility] [address] [place], [place]  [address] [phone]  AUDIOLOGICAL  EVALUATION  NAME: Danielle [name]     DOB:   1936-08-18      MRN: [id number]                                                                                     DATE: 05/28/2023     REFERENT: Corwin Levins, MD STATUS: Outpatient DIAGNOSIS: Bilateral Sloping Mild to Severe Sensorineural Hearing Loss    History: Danielle [name] was seen for an audiological evaluation. Danielle [name] was accompanied to the appointment by her daughter.  Danielle [name] is receiving a hearing evaluation due to concerns for decreased hearing by her family. Danielle [name] does not perceive any difficulty. No pain or pressure reported in either ear. No Tinnitus both ears. Danielle [name] does not have a history of noise exposure.  Medical history positive for dementia. No risk factors for hearing loss reported.   Evaluation:  Otoscopy showed a clear view of the tympanic membranes, bilaterally Tympanometry results were consistent with normal middle ear function and tympanic membrane movement (Type A).  Audiometric testing was completed using conventional audiometry with supraural transducer. Speech Recognition Thresholds were 45 dB in the right ear and 40 dB in the left ear. Word Recognition was performed 85 dB SL, scored 80 % in the right ear and 80% in the left ear. Pure tone thresholds show a sloping mild to severe sensorineural hearing loss in the right ear and left ear.   Results:  The test results were reviewed with Danielle [name] and her daughter. Otoscopy and tympanometry indicate normal outer and middle ear anatomy and function.  Audiometry shows a bilateral sloping mild to severe sensorineural hearing loss. Hearing aids were recommended and information on local practices to purchase hearing aids were provided, but Danielle [name] denied interest in a hearing aid.    Recommendations:  No further audiologic testing is needed  unless future hearing concerns arise.   Amplification is recommended for both ears. Hearing aids can be purchased from a variety of locations. See provided list for locations in the [place] area.   45 minutes spent testing and counseling on results.   Ammie Ferrier  Audiologist, Au.D., CCC-A 05/28/2023  4:48 PM  Danielle Cadmus Tera Partridge, MS Audiology Student    Cc: Corwin Levins, MD

## 2023-05-30 ENCOUNTER — Other Ambulatory Visit: Payer: Self-pay | Admitting: Neurology

## 2023-05-30 ENCOUNTER — Ambulatory Visit (HOSPITAL_COMMUNITY): Admission: RE | Admit: 2023-05-30 | Payer: Medicare HMO | Source: Ambulatory Visit

## 2023-05-30 MED ORDER — ATORVASTATIN CALCIUM 40 MG PO TABS: 40.0000 mg | ORAL_TABLET | Freq: Every day | ORAL | 0 refills | Status: DC

## 2023-05-30 NOTE — Telephone Encounter (Signed)
Requested Prescriptions   Pending Prescriptions Disp Refills   atorvastatin (LIPITOR) 40 MG tablet 90 tablet 0    Sig: Take 1 tablet (40 mg total) by mouth daily.   Signed Prescriptions Disp Refills   atorvastatin (LIPITOR) 40 MG tablet 90 tablet 0    Sig: Take 1 tablet by mouth daily.    Authorizing Provider: Teresa Coombs, AMADOU    Ordering User: Lenn Cal

## 2023-06-14 ENCOUNTER — Ambulatory Visit (HOSPITAL_COMMUNITY): Payer: Medicare HMO | Attending: Internal Medicine

## 2023-06-18 ENCOUNTER — Ambulatory Visit: Payer: Medicare HMO | Admitting: Neurology

## 2023-06-19 ENCOUNTER — Ambulatory Visit: Payer: Medicare HMO | Admitting: Neurology

## 2023-06-19 ENCOUNTER — Encounter: Payer: Self-pay | Admitting: Neurology

## 2023-06-19 VITALS — BP 159/93 | HR 76 | Ht 64.0 in | Wt 113.0 lb

## 2023-06-19 DIAGNOSIS — G301 Alzheimer's disease with late onset: Secondary | ICD-10-CM

## 2023-06-19 DIAGNOSIS — F02A0 Dementia in other diseases classified elsewhere, mild, without behavioral disturbance, psychotic disturbance, mood disturbance, and anxiety: Secondary | ICD-10-CM

## 2023-06-19 NOTE — Progress Notes (Signed)
[facility]  PATIENT: Danielle [name] DOB: [birth year] REQUESTING CLINICIAN: Corwin Levins, MD HISTORY FROM: Patient  REASON FOR VISIT: Cognitive and behavioral problem   HISTORICAL  CHIEF COMPLAINT:  Chief Complaint  Patient presents with   Follow-up    Rm 12. Patient with daughter, reports everything still remains the same.     INTERVAL HISTORY 06/19/2023:  Patient presents for follow-up, she is accompanied by her daughter.  Last visit was a year ago and since then, she has been doing well.  She did moved to [place] state for about 80-months and because she needed a larger space, gardening space, she bought a new house in [place] and move back.  Now she lives with her daughter in [place].  She is happy to be back here.  Overall she is stable, she is compliant with her medications.   INTERVAL HISTORY 06/15/2022:  [name] presents today for follow-up, she is accompanied by her daughter. She had her neuropsych evaluation completed on July 14.  The results are consistent with mild Alzheimer disease.  She has not seen Dr. Milbert Coulter yet for feedback, they have an appointment with him on August 1.  She reports planning to move in the next couple months back to [place] where she will be close to her daughter.  There will most likely set up care with a neurologist there. Currently she still lives alone, but her daughter who visits her multiple times per week, she does not drive, daughter he is paying all of her bills.  She is able to cook, denies leaving the stove on. She is still independent to some degree.    Neuropsych clinical impression  Ms. [name]'s pattern of performance is suggestive of diffuse and fairly severe cognitive impairment relative to age-matched peers. She performed well across tasks assessing basic attention, receptive language, phonemic fluency, and confrontation naming. However, impaired performances were exhibited across all other assessed  cognitive domains. This includes processing speed, complex attention, cognitive flexibility, safety/judgment, semantic fluency, visuospatial abilities, and all aspects of learning and memory. Regarding activities of daily living (ADLs), her daughter fully manages finances and bill paying and also provides assistance with medication management. Ms. [name] also does not drive. The severity of cognitive impairment would best align with a diagnostic classification of a Major Neurocognitive Disorder ("dementia"). However, functionally, she is likely towards the milder end of this spectrum given her daughter's overall summation of her mother being very independent.   HISTORY OF PRESENT ILLNESS:  This is a 87 year old woman with past medical history of hypothyroidism, hypertension, hyperlipidemia who is presenting with memory decline and behavioral changes for the past year after death of husband.  Patient reported that her husband died on 2020-12-04, and he has been going through grief.  Per patient, there was a period of time where she could not remember anything.  She reported that her daughter has told her that she was acting strange but patient does not remember it, and there was also report that she did not believe that her husband died but later recognized that he died and she was comfortable with it.  She tells me that she believe that she had this reaction because she was frightened, she said that her husband was a protector and it was very scared to know that she is living alone in a [place] acres farm alone and does not have any protection. Her daughter was present during this interview and was able to give  additional insight of her mother's condition.  She reported that after the death of their father her mom was going to grief, she had lost a lot of weight, and this past summer she could not take care of her old horses, she had to put 2 of them down and that was very traumatic for her because she loves  her horses.  On December 2 they had to call the ambulance because she was not doing well, she could not walk and she was acting strange.  There was report that she believed that somebody was in her closet.  She was noted to be a little paranoid thinking that her daughter was trying to kidnap her and she had tried to escape from her home.  She was taken to the hospital, she was started on Seroquel which was gradually increased up to 3 times daily. For the past month they report improvement of her symptoms, she still forgetful, has to ask the same question multiple times, for instance daughter reports that for today's appointment she had to tell her four time but she had a neurology appointment today.  Other than that she is independent, she currently she does not drive but she is able to cook, clean, bathe and take care of her own self.  Her daughter is living with her and her family his helping her with her bills because back in October she had not paid her bills and was having issue with her bank and a creditors.  She remains very active in a farm.   OTHER MEDICAL CONDITIONS: Hypothyroid, cognitive impairment, HTN, HLD   REVIEW OF SYSTEMS: Full 14 system review of systems performed and negative with exception of: as noted in the HPI  ALLERGIES: Allergies  Allergen Reactions   Codeine Nausea And Vomiting    Pt is unaware of reaction    Oxycodone Nausea And Vomiting    Pt is unaware of reaction     HOME MEDICATIONS: Outpatient Medications Prior to Visit  Medication Sig Dispense Refill   acetaminophen (TYLENOL) 500 MG tablet Take 500-1,000 mg by mouth every 6 (six) hours as needed for mild pain.     alendronate (FOSAMAX) 70 MG tablet Take 1 tablet (70 mg total) by mouth every 7 (seven) days. Take with a full glass of water on an empty stomach. 12 tablet 3   atenolol (TENORMIN) 50 MG tablet Take 1 tablet (50 mg total) by mouth daily. 90 tablet 3   atorvastatin (LIPITOR) 40 MG tablet Take 1  tablet (40 mg total) by mouth daily. 90 tablet 0   azelastine (ASTELIN) 0.1 % nasal spray Place 1 spray into both nostrils as needed (Summer Allergies). 30 mL 11   donepezil (ARICEPT) 10 MG tablet Take 1 tablet (10 mg total) by mouth at bedtime. 90 tablet 3   enalapril (VASOTEC) 10 MG tablet Take 1 tablet (10 mg total) by mouth daily. 90 tablet 3   gabapentin (NEURONTIN) 100 MG capsule Take 1 capsule (100 mg total) by mouth 3 (three) times daily. 270 capsule 1   levothyroxine (SYNTHROID) 112 MCG tablet Take 1 tablet (112 mcg total) by mouth daily before breakfast. 90 tablet 1   LORazepam (ATIVAN) 0.5 MG tablet Take 1 tablet (0.5 mg total) by mouth 2 (two) times daily as needed for anxiety. 60 tablet 2   Multiple Minerals-Vitamins (CAL-MAG-ZINC-D PO) Take 1 tablet by mouth in the morning and at bedtime.     QUEtiapine (SEROQUEL) 25 MG tablet Take 1 tablet  by mouth three times daily. 270 tablet 1   No facility-administered medications prior to visit.    PAST MEDICAL HISTORY: Past Medical History:  Diagnosis Date   Acute bronchitis 10/20/2008   Allergic rhinitis 01/09/2008   Alopecia 01/21/2015   Arthritis 06/11/2020   Atrioventricular block, complete 12/16/2009   Blood in urine 02/22/2017   Cardiac pacemaker in situ 01/23/2012   Cervical radiculopathy 07/11/2013   Complete atrioventricular block 12/16/2009   Degenerative joint disease of right hip 12/17/2018   Dizziness 08/03/2012   Essential hypertension 01/09/2008   Gait disorder 08/27/2021   Gastroesophageal reflux disease 01/09/2008   Generalized anxiety disorder 01/09/2008   Genetic testing 06/04/2017   Ms. Stearnes underwent genetic counseling and testing for hereditary cancer syndromes on 05/24/2017. Her results were negative for mutations in all 46 genes analyzed by [facility]'s 46-gene Common Hereditary Cancers Panel. Genes analyzed include: APC, ATM, AXIN2, BARD1, BMPR1A, BRCA1, BRCA2, BRIP1, CDH1, CDKN2A, CHEK2, CTNNA1, DICER1,  EPCAM, GREM1, HOXB13, KIT, MEN1, MLH1, MSH2, MSH3, MSH6, MUTYH, NBN   History of right mastectomy 01/11/2017   History of thromboembolism of vein 01/09/2008   History of total hip arthroplasty 04/28/2019   Hyperbilirubinemia 12/16/2021   Hyperlipidemia 01/09/2008   Hypoproteinemia 12/16/2021   Hypothyroidism 01/09/2008   Impaired glucose tolerance 07/28/2011   Insomnia 06/15/2009   Left leg swelling 01/21/2015   Low back pain 04/21/2021   Major depressive disorder 01/09/2008   Malignant neoplasm of upper-inner quadrant of female breast 10/06/2016   Mild dementia 06/02/2022   Neck pain 11/04/2019   Normocytic anemia 01/09/2008   Olecranon bursitis of left elbow 05/05/2015   Osteoarthritis cervical spine 02/25/2010   Pacemaker-medtronic 01/23/2012   DOI 1990s Extraction 1997 Generator replacement 2006, 2013   Peripheral venous insufficiency 03/15/2017   Personal history of venous thrombosis and embolism 01/09/2008   Renal calculus, right 02/09/2020   Right hip pain 07/18/2016   Spondylosis 02/25/2010   Status post total hip replacement, right 11/04/2019   Subungual hematoma of foot 03/15/2017   Trigger finger of left hand 07/14/2020   Trigger finger of right hand 07/14/2020   Urinary incontinence 10/09/2019   Vitamin D deficiency 03/05/2022    PAST SURGICAL HISTORY: Past Surgical History:  Procedure Laterality Date   BREAST BIOPSY Right 09/20/2016   malignant   ERCP W/ SPHICTEROTOMY     pt denies   HEMORRHOID SURGERY     pt denies   IR URETERAL STENT RIGHT NEW ACCESS W/O SEP NEPHROSTOMY CATH  02/09/2020   MASTECTOMY Right 01/11/2017   MASTECTOMY W/ SENTINEL NODE BIOPSY Right 01/11/2017   Procedure: RIGHT MASTECTOMY WITH SENTINEL LYMPH NODE BIOPSY;  Surgeon: Almond Lint, MD;  Location: [facility] OR;  Service: General;  Laterality: Right;   NEPHROLITHOTOMY Right 02/09/2020   Procedure: NEPHROLITHOTOMY PERCUTANEOUS;  Surgeon: Ihor Gully, MD;  Location: [facility] ORS;  Service: Urology;   Laterality: Right;   PACEMAKER PLACEMENT Left    MedTronic EnPulse [id number] no heartbeat without pacemaker   PERMANENT PACEMAKER GENERATOR CHANGE N/A 07/16/2012   Procedure: PERMANENT PACEMAKER GENERATOR CHANGE;  Surgeon: Duke Salvia, MD;  Location: [facility] CATH LAB;  Service: Cardiovascular;  Laterality: N/A;   POLYPECTOMY     Uterine   PPM GENERATOR CHANGEOUT N/A 04/10/2022   Procedure: PPM GENERATOR CHANGEOUT;  Surgeon: Duke Salvia, MD;  Location: [facility] INVASIVE CV LAB;  Service: Cardiovascular;  Laterality: N/A;   TOTAL HIP ARTHROPLASTY Right 04/28/2019   Procedure: RIGHT TOTAL HIP ARTHROPLASTY ANTERIOR APPROACH;  Surgeon: Roda Shutters,  Edwin Cap, MD;  Location: [facility] OR;  Service: Orthopedics;  Laterality: Right;    FAMILY HISTORY: Family History  Problem Relation Age of Onset   Sudden death Mother        d.23s   Breast cancer Mother 65   Pulmonary embolism Father        Possible   Parkinson's disease Brother    Schizophrenia Brother    Lung cancer Paternal Grandfather        history of smoking   Breast cancer Maternal Aunt        d.78s    SOCIAL HISTORY: Social History   Socioeconomic History   Marital status: Widowed    Spouse name: Not on file   Number of children: Not on file   Years of education: 16   Highest education level: Bachelor's degree (e.g., BA, AB, BS)  Occupational History   Occupation: Retired    Comment: Curator; manages farm  Tobacco Use   Smoking status: Former    Current packs/day: 0.00    Average packs/day: 0.2 packs/day for 25.0 years (5.0 ttl pk-yrs)    Types: Cigarettes    Start date: 11/20/1946    Quit date: 11/21/1971    Years since quitting: 51.6   Smokeless tobacco: Never  Vaping Use   Vaping status: Never Used  Substance and Sexual Activity   Alcohol use: Yes    Comment: wine   Drug use: No   Sexual activity: Not on file  Other Topics Concern   Not on file  Social History Narrative   Not on file   Social Determinants of Health    Financial Resource Strain: Low Risk  (04/15/2023)   Overall Financial Resource Strain (CARDIA)    Difficulty of Paying Living Expenses: Not hard at all  Food Insecurity: No Food Insecurity (04/15/2023)   Hunger Vital Sign    Worried About Running Out of Food in the Last Year: Never true    Ran Out of Food in the Last Year: Never true  Transportation Needs: No Transportation Needs (04/15/2023)   PRAPARE - Administrator, Civil Service (Medical): No    Lack of Transportation (Non-Medical): No  Physical Activity: Sufficiently Active (04/15/2023)   Exercise Vital Sign    Days of Exercise per Week: 3 days    Minutes of Exercise per Session: 50 min  Stress: Stress Concern Present (04/15/2023)   [employer] of Occupational Health - Occupational Stress Questionnaire    Feeling of Stress : To some extent  Social Connections: Moderately Isolated (04/15/2023)   Social Connection and Isolation Panel [NHANES]    Frequency of Communication with Friends and Family: Three times a week    Frequency of Social Gatherings with Friends and Family: More than three times a week    Attends Religious Services: Never    Database administrator or Organizations: No    Attends Banker Meetings: 1 to 4 times per year    Marital Status: Widowed  Intimate Partner Violence: Not At Risk (06/27/2022)   Humiliation, Afraid, Rape, and Kick questionnaire    Fear of Current or Ex-Partner: No    Emotionally Abused: No    Physically Abused: No    Sexually Abused: No    PHYSICAL EXAM  GENERAL EXAM/CONSTITUTIONAL: Vitals:  Vitals:   06/19/23 1341  BP: (!) 159/93  Pulse: 76  Weight: 113 lb (51.3 kg)  Height: 5\' 4"  (1.626 m)   Body mass index is 19.4  kg/m. Wt Readings from Last 3 Encounters:  06/19/23 113 lb (51.3 kg)  04/17/23 114 lb (51.7 kg)  07/17/22 112 lb (50.8 kg)   Patient is in no distress; well developed, nourished and groomed; neck is supple  EYES: Pupils round and  reactive to light, Visual fields full to confrontation, Extraocular movements intacts,   MUSCULOSKELETAL: Gait, strength, tone, movements noted in Neurologic exam below  NEUROLOGIC: MENTAL STATUS:     06/19/2023    1:42 PM 06/27/2022    3:36 PM 11/30/2021    2:11 PM  MMSE - Mini Mental State Exam  Not completed:  Unable to complete   Orientation to time 0  3  Orientation to Place 2  4  Registration 3  3  Attention/ Calculation 1  0  Recall 1  2  Language- name 2 objects 2  2  Language- repeat 1  1  Language- follow 3 step command 3  3  Language- read & follow direction 1  1  Write a sentence 1  1  Copy design 0  1  Total score 15  21    CRANIAL NERVE: 2nd, 3rd, 4th, 6th - pupils equal and reactive to light, visual fields full to confrontation, extraocular muscles intact, no nystagmus 5th - facial sensation symmetric 7th - facial strength symmetric 8th - hearing intact 9th - palate elevates symmetrically, uvula midline 11th - shoulder shrug symmetric 12th - tongue protrusion midline  MOTOR:  normal bulk and tone, full strength in the BUE, BLE  SENSORY:  normal and symmetric to light touch, pinprick, temperature, vibration  COORDINATION:  finger-nose-finger, fine finger movements normal  REFLEXES:  deep tendon reflexes present and symmetric  GAIT/STATION:  normal   DIAGNOSTIC DATA (LABS, IMAGING, TESTING) - I reviewed patient records, labs, notes, testing and imaging myself where available.  Lab Results  Component Value Date   WBC 7.4 04/17/2023   HGB 11.7 (L) 04/17/2023   HCT 34.8 (L) 04/17/2023   MCV 92.9 04/17/2023   PLT 223.0 04/17/2023      Component Value Date/Time   NA 139 04/17/2023 1450   NA 141 04/20/2022 1528   NA 140 07/25/2017 1347   K 4.5 04/17/2023 1450   K 4.3 07/25/2017 1347   CL 102 04/17/2023 1450   CO2 28 04/17/2023 1450   CO2 26 07/25/2017 1347   GLUCOSE 90 04/17/2023 1450   GLUCOSE 77 07/25/2017 1347   BUN 27 (H) 04/17/2023  1450   BUN 37 (H) 04/20/2022 1528   BUN 27.4 (H) 07/25/2017 1347   CREATININE 1.00 04/17/2023 1450   CREATININE 1.11 (H) 09/10/2020 1337   CREATININE 1.1 07/25/2017 1347   CALCIUM 10.1 04/17/2023 1450   CALCIUM 9.8 07/25/2017 1347   PROT 7.1 04/17/2023 1450   PROT 6.6 07/25/2017 1347   ALBUMIN 4.7 04/17/2023 1450   ALBUMIN 4.0 07/25/2017 1347   AST 25 04/17/2023 1450   AST 20 09/10/2020 1337   AST 29 07/25/2017 1347   ALT 19 04/17/2023 1450   ALT 9 09/10/2020 1337   ALT 28 07/25/2017 1347   ALKPHOS 54 04/17/2023 1450   ALKPHOS 75 07/25/2017 1347   BILITOT 1.1 04/17/2023 1450   BILITOT 1.2 09/10/2020 1337   BILITOT 1.21 (H) 07/25/2017 1347   GFRNONAA 55 (L) 04/10/2022 1357   GFRNONAA 50 (L) 09/10/2020 1337   GFRNONAA 57 (L) 07/15/2012 1714   GFRAA 58 (L) 02/03/2020 1448   GFRAA 66 07/15/2012 1714   Lab Results  Component  Value Date   CHOL 180 04/17/2023   HDL 89.00 04/17/2023   LDLCALC 75 04/17/2023   LDLDIRECT 135.0 02/27/2022   TRIG 78.0 04/17/2023   CHOLHDL 2 04/17/2023   Lab Results  Component Value Date   HGBA1C 5.5 04/17/2023   Lab Results  Component Value Date   VITAMINB12 >1500 (H) 04/17/2023   Lab Results  Component Value Date   TSH 3.86 04/17/2023    Head CT 10/23/2021 Chronic atrophic and ischemic changes without acute abnormality.   Full Neuropsych evaluation 06/02/2022: Clinical Impression(s): Ms. Sinatra pattern of performance is suggestive of diffuse and fairly severe cognitive impairment relative to age-matched peers. She performed well across tasks assessing basic attention, receptive language, phonemic fluency, and confrontation naming. However, impaired performances were exhibited across all other assessed cognitive domains. This includes processing speed, complex attention, cognitive flexibility, safety/judgment, semantic fluency, visuospatial abilities, and all aspects of learning and memory. Regarding activities of daily living (ADLs), her  daughter fully manages finances and bill paying and also provides assistance with medication management. Ms. Delcarlo also does not drive. The severity of cognitive impairment would best align with a diagnostic classification of a Major Neurocognitive Disorder ("dementia"). However, functionally, she is likely towards the milder end of this spectrum given her daughter's overall summation of her mother being very independent.    It is worth highlighting that Ms. Zoellick had significant difficulty across a task designed to assess safety and judgment in a variety of scenarios. For example, when asked why one shouldn't leave a young child alone at home, she responded "bad men around to shoot them." When asked why one should replace batteries in smoke detectors regularly, she exhibited confusion and stated "I don't know, why would you?" When asked to define what it meant by a medication having a 25% chance of having negative side effects, she responded "change doctors, get a second opinion." Based upon these responses, it may be prudent for her daughter and other family members to actively monitor safety behaviors in her day-to-day life.    The overall cause of ongoing cognitive impairment is somewhat unclear. However, I do have concerns surrounding a late-onset presentation of Alzheimer's disease. Across memory tasks, she did not benefit from repeated exposure to information, was fully amnestic (i.e., 0% retention) across all administered memory tasks, and performed poorly across recognition trials. Taken together, this suggests rapid forgetting and a significant memory storage impairment, both of which are the hallmark memory characteristics of this illness. Impairments in semantic fluency (relative to adequate phonemic fluency performance) and visuospatial abilities also aligns well with this condition. She did perform well across confrontation naming tasks. However, this illness appears to fit her clinical  presentation best at the present time.    She did not report behavorial symptoms concerning for Lewy body dementia or frontotemporal dementia. Past neuroimaging (head CT) did not reveal significant cerebrovascular abnormalities, making a vascular dementia presentation unlikely. She reported minimal to no symptoms of anxiety, depression, or other psychiatric distress across mood-related questionnaires. Despite her grief and bereavement experiences starting in December 2021, psychiatric distress unfortunately cannot explain fully amnestic memory performances. Continued medical monitoring will be important moving forward.    Recommendations: Ms. Pineo should discuss medication options to address memory loss and concerns surrounding Alzheimer's disease with her neurologist. It is important to highlight that these medications have been shown to slow functional decline in some individuals. There is no current treatment which can stop or reverse cognitive decline when caused by a  neurodegenerative illness.    As it appears that she may be moving to [place], I would recommend that she establish care with a neurologist in her area once she is settled.    I would recommend that Ms. Becerril continue to fully abstain from driving pursuits based upon current testing.    It will be important for her to have another person with her when in situations where she may need to process information, weigh the pros and cons of different options, and make decisions, in order to ensure that she fully understands and recalls all information to be considered.   If not already done, she and her family may want to discuss her wishes regarding durable power of attorney and medical decision making, so that she can have input into these choices. Additionally, they may wish to discuss future plans for caretaking and seek out community options for in home/residential care should they become necessary.   Ms. Chapnick is encouraged to  attend to lifestyle factors for brain health (e.g., regular physical exercise, good nutrition habits, regular participation in cognitively-stimulating activities, and general stress management techniques), which are likely to have benefits for both emotional adjustment and cognition. In fact, in addition to promoting good general health, regular exercise incorporating aerobic activities (e.g., brisk walking, jogging, cycling, etc.) has been demonstrated to be a very effective treatment for depression and stress, with similar efficacy rates to both antidepressant medication and psychotherapy. Optimal control of vascular risk factors (including safe cardiovascular exercise and adherence to dietary recommendations) is encouraged. Continued participation in activities which provide mental stimulation and social interaction is also recommended.    Important information should be provided to Ms. Bernette Mayers in written format in all instances. This information should be placed in a highly frequented and easily visible location within her home to promote recall. External strategies such as written notes in a consistently used memory journal, visual and nonverbal auditory cues such as a calendar on the refrigerator or appointments with alarm, such as on a cell phone, can also help maximize recall.   ASSESSMENT AND PLAN  87 y.o. year old female with hypertension, hyperlipidemia and hypothyroidism who is presenting for follow up for her dementia, likely Alzheimer's disease.  She is on Aricept 10 mg nightly, compliant with the medication.  Overall she is stable.  Plan is to continue patient on same medications and if needed in the future we will add Namenda.  This was a explained to the patient and daughter and they are comfortable with plan.  Continue to follow PCP and return in 1 year or sooner if worse.   1. Mild late onset Alzheimer's dementia without behavioral disturbance, psychotic disturbance, mood disturbance, or  anxiety (HCC)     Patient Instructions  Continue with Aricept 10 mg nightly Continue your other medications Follow-up in 1 year or sooner if worse.  No orders of the defined types were placed in this encounter.   No orders of the defined types were placed in this encounter.   Return in about 1 year (around 06/18/2024).  I have spent a total of 50 minutes dedicated to this patient today, preparing to see patient, performing a medically appropriate examination and evaluation, ordering tests and/or medications and procedures, and counseling and educating the patient/family/caregiver; independently interpreting result and communicating results to the family/patient/caregiver; and documenting clinical information in the electronic medical record.   Windell Norfolk, MD 06/19/2023, 5:10 PM  [facility] [address] [place], [place] [address] [phone]  [phone]  

## 2023-06-19 NOTE — Patient Instructions (Addendum)
Continue with Aricept 10 mg nightly Continue your other medications Follow-up in 1 year or sooner if worse.

## 2023-07-25 ENCOUNTER — Ambulatory Visit: Payer: Medicare HMO

## 2023-08-21 ENCOUNTER — Encounter: Payer: Self-pay | Admitting: Internal Medicine

## 2023-08-21 ENCOUNTER — Ambulatory Visit: Payer: Medicare HMO | Attending: Internal Medicine | Admitting: Internal Medicine

## 2023-08-21 VITALS — BP 142/68 | HR 74 | Ht 64.0 in | Wt 109.0 lb

## 2023-08-21 DIAGNOSIS — I442 Atrioventricular block, complete: Secondary | ICD-10-CM

## 2023-08-21 DIAGNOSIS — Z950 Presence of cardiac pacemaker: Secondary | ICD-10-CM

## 2023-08-21 LAB — CUP PACEART INCLINIC DEVICE CHECK
Battery Remaining Longevity: 48 mo
Battery Voltage: 2.99 V
Brady Statistic AP VP Percent: 68.45 %
Brady Statistic AP VS Percent: 0.1 %
Brady Statistic AS VP Percent: 30.97 %
Brady Statistic AS VS Percent: 0.49 %
Brady Statistic RA Percent Paced: 68.76 %
Brady Statistic RV Percent Paced: 99.42 %
Date Time Interrogation Session: 20241001145232
Implantable Lead Connection Status: 753985
Implantable Lead Connection Status: 753985
Implantable Lead Implant Date: 19970117
Implantable Lead Implant Date: 19970117
Implantable Lead Location: 753859
Implantable Lead Location: 753860
Implantable Lead Model: 4068
Implantable Lead Model: 4068
Implantable Pulse Generator Implant Date: 20230522
Lead Channel Impedance Value: 418 Ohm
Lead Channel Impedance Value: 475 Ohm
Lead Channel Impedance Value: 532 Ohm
Lead Channel Impedance Value: 551 Ohm
Lead Channel Pacing Threshold Amplitude: 1.75 V
Lead Channel Pacing Threshold Amplitude: 1.75 V
Lead Channel Pacing Threshold Amplitude: 1.75 V
Lead Channel Pacing Threshold Amplitude: 1.75 V
Lead Channel Pacing Threshold Amplitude: 2.125 V
Lead Channel Pacing Threshold Pulse Width: 0.4 ms
Lead Channel Pacing Threshold Pulse Width: 1 ms
Lead Channel Pacing Threshold Pulse Width: 1 ms
Lead Channel Pacing Threshold Pulse Width: 1.5 ms
Lead Channel Pacing Threshold Pulse Width: 1.5 ms
Lead Channel Sensing Intrinsic Amplitude: 0 mV
Lead Channel Sensing Intrinsic Amplitude: 1.125 mV
Lead Channel Sensing Intrinsic Amplitude: 11.375 mV
Lead Channel Sensing Intrinsic Amplitude: 11.375 mV
Lead Channel Sensing Intrinsic Amplitude: 2 mV
Lead Channel Setting Pacing Amplitude: 2.5 V
Lead Channel Setting Pacing Amplitude: 3.5 V
Lead Channel Setting Pacing Pulse Width: 1.5 ms
Lead Channel Setting Sensing Sensitivity: 5.6 mV
Zone Setting Status: 755011
Zone Setting Status: 755011

## 2023-08-21 NOTE — Patient Instructions (Signed)
Medication Instructions:  Your physician recommends that you continue on your current medications as directed. Please refer to the Current Medication list given to you today.  *If you need a refill on your cardiac medications before your next appointment, please call your pharmacy*   Lab Work: None ordered.  If you have labs (blood work) drawn today and your tests are completely normal, you will receive your results only by: MyChart Message (if you have MyChart) OR A paper copy in the mail If you have any lab test that is abnormal or we need to change your treatment, we will call you to review the results.   Testing/Procedures: None ordered.    Follow-Up: At  [facility], you and your health needs are our priority.  As part of our continuing mission to provide you with exceptional heart care, we have created designated Provider Care Teams.  These Care Teams include your primary Cardiologist (physician) and Advanced Practice Providers (APPs -  Physician Assistants and Nurse Practitioners) who all work together to provide you with the care you need, when you need it.  We recommend signing up for the patient portal called "MyChart".  Sign up information is provided on this After Visit Summary.  MyChart is used to connect with patients for Virtual Visits (Telemedicine).  Patients are able to view lab/test results, encounter notes, upcoming appointments, etc.  Non-urgent messages can be sent to your provider as well.   To learn more about what you can do with MyChart, go to [email or web address].    Your next appointment:   9 months with Dr Klein 

## 2023-08-21 NOTE — Progress Notes (Signed)
Patient Care Team: Corwin Levins, MD as PCP - Reyne Dumas, Salvatore Decent, MD as PCP - Cardiology (Cardiology) Griselda Miner, MD as Consulting Physician (General Surgery) Magrinat, Valentino Hue, MD (Inactive) as Consulting Physician (Oncology) Meryl Dare, MD as Consulting Physician (Gastroenterology) Duke Salvia, MD as Consulting Physician (Cardiology) Laverta Baltimore, NP as Nurse Practitioner (Nurse Practitioner) Griffin Dakin, MD as Referring Physician (Orthopedic Surgery) Tarry Kos, MD as Attending Physician (Orthopedic Surgery)   HPI  Danielle [name] is a 87 y.o. female is seen in followup for complete heart block for which she is status post pacemaker.  She underwent device generator replacement 8/13 2023  She was diagnosed with breast cancer 2017   She had moved to [place] with her daughters, she is glad to be back.  She is finally "escaped the jail ".  She is living by herself, writing and attending regarding denies chest pain shortness of breath or lightheadedness.   Date Cr K TSH Hgb  4/19    9.62    9/19 0.97 4.0  10.0  5/24 1.0 4.5 3.86 11.7     Past Medical History:  Diagnosis Date   Acute bronchitis 10/20/2008   Allergic rhinitis 01/09/2008   Alopecia 01/21/2015   Arthritis 06/11/2020   Atrioventricular block, complete 12/16/2009   Blood in urine 02/22/2017   Cardiac pacemaker in situ 01/23/2012   Cervical radiculopathy 07/11/2013   Complete atrioventricular block 12/16/2009   Degenerative joint disease of right hip 12/17/2018   Dizziness 08/03/2012   Essential hypertension 01/09/2008   Gait disorder 08/27/2021   Gastroesophageal reflux disease 01/09/2008   Generalized anxiety disorder 01/09/2008   Genetic testing 06/04/2017   Ms. Brathwaite underwent genetic counseling and testing for hereditary cancer syndromes on 05/24/2017. Her results were negative for mutations in all 46 genes analyzed by [facility]'s 46-gene Common  Hereditary Cancers Panel. Genes analyzed include: APC, ATM, AXIN2, BARD1, BMPR1A, BRCA1, BRCA2, BRIP1, CDH1, CDKN2A, CHEK2, CTNNA1, DICER1, EPCAM, GREM1, HOXB13, KIT, MEN1, MLH1, MSH2, MSH3, MSH6, MUTYH, NBN   History of right mastectomy 01/11/2017   History of thromboembolism of vein 01/09/2008   History of total hip arthroplasty 04/28/2019   Hyperbilirubinemia 12/16/2021   Hyperlipidemia 01/09/2008   Hypoproteinemia 12/16/2021   Hypothyroidism 01/09/2008   Impaired glucose tolerance 07/28/2011   Insomnia 06/15/2009   Left leg swelling 01/21/2015   Low back pain 04/21/2021   Major depressive disorder 01/09/2008   Malignant neoplasm of upper-inner quadrant of female breast 10/06/2016   Mild dementia 06/02/2022   Neck pain 11/04/2019   Normocytic anemia 01/09/2008   Olecranon bursitis of left elbow 05/05/2015   Osteoarthritis cervical spine 02/25/2010   Pacemaker-medtronic 01/23/2012   DOI 1990s Extraction 1997 Generator replacement 2006, 2013   Peripheral venous insufficiency 03/15/2017   Personal history of venous thrombosis and embolism 01/09/2008   Renal calculus, right 02/09/2020   Right hip pain 07/18/2016   Spondylosis 02/25/2010   Status post total hip replacement, right 11/04/2019   Subungual hematoma of foot 03/15/2017   Trigger finger of left hand 07/14/2020   Trigger finger of right hand 07/14/2020   Urinary incontinence 10/09/2019   Vitamin D deficiency 03/05/2022    Past Surgical History:  Procedure Laterality Date   BREAST BIOPSY Right 09/20/2016   malignant   ERCP W/ SPHICTEROTOMY     pt denies   HEMORRHOID SURGERY     pt denies   IR URETERAL STENT RIGHT NEW  ACCESS W/O SEP NEPHROSTOMY CATH  02/09/2020   MASTECTOMY Right 01/11/2017   MASTECTOMY W/ SENTINEL NODE BIOPSY Right 01/11/2017   Procedure: RIGHT MASTECTOMY WITH SENTINEL LYMPH NODE BIOPSY;  Surgeon: Almond Lint, MD;  Location: [facility] OR;  Service: General;  Laterality: Right;   NEPHROLITHOTOMY Right  02/09/2020   Procedure: NEPHROLITHOTOMY PERCUTANEOUS;  Surgeon: Ihor Gully, MD;  Location: [facility] ORS;  Service: Urology;  Laterality: Right;   PACEMAKER PLACEMENT Left    MedTronic EnPulse E2DR01--06 no heartbeat without pacemaker   PERMANENT PACEMAKER GENERATOR CHANGE N/A 07/16/2012   Procedure: PERMANENT PACEMAKER GENERATOR CHANGE;  Surgeon: Duke Salvia, MD;  Location: [facility] CATH LAB;  Service: Cardiovascular;  Laterality: N/A;   POLYPECTOMY     Uterine   PPM GENERATOR CHANGEOUT N/A 04/10/2022   Procedure: PPM GENERATOR CHANGEOUT;  Surgeon: Duke Salvia, MD;  Location: [facility] INVASIVE CV LAB;  Service: Cardiovascular;  Laterality: N/A;   TOTAL HIP ARTHROPLASTY Right 04/28/2019   Procedure: RIGHT TOTAL HIP ARTHROPLASTY ANTERIOR APPROACH;  Surgeon: Tarry Kos, MD;  Location: [facility] OR;  Service: Orthopedics;  Laterality: Right;    Current Outpatient Medications  Medication Sig Dispense Refill   acetaminophen (TYLENOL) 500 MG tablet Take 500-1,000 mg by mouth every 6 (six) hours as needed for mild pain.     alendronate (FOSAMAX) 70 MG tablet Take 1 tablet (70 mg total) by mouth every 7 (seven) days. Take with a full glass of water on an empty stomach. 12 tablet 3   atenolol (TENORMIN) 50 MG tablet Take 1 tablet (50 mg total) by mouth daily. 90 tablet 3   atorvastatin (LIPITOR) 40 MG tablet Take 1 tablet (40 mg total) by mouth daily. 90 tablet 0   azelastine (ASTELIN) 0.1 % nasal spray Place 1 spray into both nostrils as needed (Summer Allergies). 30 mL 11   donepezil (ARICEPT) 10 MG tablet Take 1 tablet (10 mg total) by mouth at bedtime. 90 tablet 3   enalapril (VASOTEC) 10 MG tablet Take 1 tablet (10 mg total) by mouth daily. 90 tablet 3   levothyroxine (SYNTHROID) 112 MCG tablet Take 1 tablet (112 mcg total) by mouth daily before breakfast. 90 tablet 1   LORazepam (ATIVAN) 0.5 MG tablet Take 1 tablet (0.5 mg total) by mouth 2 (two) times daily as needed for anxiety. 60 tablet 2   Multiple  Minerals-Vitamins (CAL-MAG-ZINC-D PO) Take 1 tablet by mouth in the morning and at bedtime.     QUEtiapine (SEROQUEL) 25 MG tablet Take 1 tablet by mouth three times daily. 270 tablet 1   No current facility-administered medications for this visit.    Allergies  Allergen Reactions   Codeine Nausea And Vomiting    Pt is unaware of reaction    Oxycodone Nausea And Vomiting    Pt is unaware of reaction     Review of Systems negative except from HPI and PMH  Physical Exam BP (!) 142/68   Pulse 74   Ht 5\' 4"  (1.626 m)   Wt 109 lb (49.4 kg)   SpO2 98%   BMI 18.71 kg/m  Well developed and well nourished in no acute distress HENT normal Neck supple with JVP-flat Clear Device pocket well healed; without hematoma or erythema.  There is no tethering  Regular rate and rhythm, no  murmur Abd-soft with active BS No Clubbing cyanosis  edema Skin-warm and dry A & Oriented  Grossly normal sensory and motor function  ECG sinus w P-synchronous/ AV  pacing  Device function is  normal. Programming changes none  See Paceart for details     Assessment and  Plan  Complete heart block   Pacemaker  Medtronic    Atrial/ ventricular lead threshold increase  Hypertension  Hyperlipidemia    Device function is normal --lead thresholds are high, but within programmable range  BP well-controlled on Vasotec and atenolol.  Continue.  Living on her own with help from her daughter.

## 2023-08-23 ENCOUNTER — Other Ambulatory Visit: Payer: Self-pay | Admitting: Neurology

## 2023-09-13 ENCOUNTER — Encounter: Payer: Self-pay | Admitting: Internal Medicine

## 2023-09-25 DIAGNOSIS — H2513 Age-related nuclear cataract, bilateral: Secondary | ICD-10-CM | POA: Diagnosis not present

## 2023-10-08 DIAGNOSIS — H2511 Age-related nuclear cataract, right eye: Secondary | ICD-10-CM | POA: Diagnosis not present

## 2023-10-08 DIAGNOSIS — H2512 Age-related nuclear cataract, left eye: Secondary | ICD-10-CM | POA: Diagnosis not present

## 2023-10-08 DIAGNOSIS — H2513 Age-related nuclear cataract, bilateral: Secondary | ICD-10-CM | POA: Diagnosis not present

## 2023-10-22 ENCOUNTER — Ambulatory Visit: Payer: Medicare HMO | Admitting: Internal Medicine

## 2023-10-24 ENCOUNTER — Ambulatory Visit: Payer: Medicare HMO

## 2023-10-30 ENCOUNTER — Ambulatory Visit (INDEPENDENT_AMBULATORY_CARE_PROVIDER_SITE_OTHER): Payer: Medicare HMO | Admitting: Internal Medicine

## 2023-10-30 ENCOUNTER — Encounter: Payer: Self-pay | Admitting: Internal Medicine

## 2023-10-30 VITALS — BP 128/82 | HR 60 | Temp 98.2°F | Ht 64.0 in | Wt 114.0 lb

## 2023-10-30 DIAGNOSIS — R222 Localized swelling, mass and lump, trunk: Secondary | ICD-10-CM | POA: Insufficient documentation

## 2023-10-30 DIAGNOSIS — I10 Essential (primary) hypertension: Secondary | ICD-10-CM | POA: Diagnosis not present

## 2023-10-30 DIAGNOSIS — H9193 Unspecified hearing loss, bilateral: Secondary | ICD-10-CM | POA: Diagnosis not present

## 2023-10-30 DIAGNOSIS — Z23 Encounter for immunization: Secondary | ICD-10-CM

## 2023-10-30 DIAGNOSIS — E559 Vitamin D deficiency, unspecified: Secondary | ICD-10-CM | POA: Diagnosis not present

## 2023-10-30 DIAGNOSIS — C50211 Malignant neoplasm of upper-inner quadrant of right female breast: Secondary | ICD-10-CM

## 2023-10-30 DIAGNOSIS — Z171 Estrogen receptor negative status [ER-]: Secondary | ICD-10-CM

## 2023-10-30 NOTE — Assessment & Plan Note (Signed)
Etiology unclear, for general surgury referral

## 2023-10-30 NOTE — Assessment & Plan Note (Signed)
Last vitamin D Lab Results  Component Value Date   VD25OH 26.49 (L) 04/17/2023   Low, to start oral replacement

## 2023-10-30 NOTE — Assessment & Plan Note (Signed)
Also for f/u left breast mammogram

## 2023-10-30 NOTE — Assessment & Plan Note (Signed)
Pt encouraged for OTC hearing aids

## 2023-10-30 NOTE — Assessment & Plan Note (Signed)
BP Readings from Last 3 Encounters:  10/30/23 128/82  08/21/23 (!) 142/68  06/19/23 (!) 159/93   Stable, pt to continue medical treatment tenormin 50 every day, vasotec 10 qd

## 2023-10-30 NOTE — Patient Instructions (Signed)
Please consider going to [employer], or [employer] for the OTC hearing aids  Please continue all other medications as before, and refills have been done if requested.  Please have the pharmacy call with any other refills you may need.  Please continue your efforts at being more active, low cholesterol diet, and weight control.  Please keep your appointments with your specialists as you may have planned  You will be contacted regarding the referral for: Left mammogram, and General Surgury  We can hold on lab testing today  Please make an Appointment to return in 6 months, or sooner if needed

## 2023-10-30 NOTE — Progress Notes (Signed)
Patient ID: PAZ PEDROZA, female   DOB: Jun 24, 1936, 87 y.o.   MRN: [id number]        Chief Complaint: follow up right chest nodule, hx of right breast ca, low vit d, htn       HPI:  Danielle [name] is a 87 y.o. female here with daughter, has lived in [place] state with family after husband died and she had emotional issues, now back to living in [place] by herself in her home; c/o possibly increased size nodule right chest wall, in the setting of hx of breast ca s/p right mastectomy.  Was seen last per oncology oct 2022 with same but pt apparently did not follow through with recommended left breast mammogram and general surgury evaluation.  Pt denies chest pain, increased sob or doe, wheezing, orthopnea, PND, increased LE swelling, palpitations, dizziness or syncope.   Pt denies polydipsia, polyuria, or new focal neuro s/s.    Pt denies fever, wt loss, night sweats, loss of appetite, or other constitutional symptoms  Also has worsening bilateral hearing loss       Wt Readings from Last 3 Encounters:  10/30/23 114 lb (51.7 kg)  08/21/23 109 lb (49.4 kg)  06/19/23 113 lb (51.3 kg)   BP Readings from Last 3 Encounters:  10/30/23 128/82  08/21/23 (!) 142/68  06/19/23 (!) 159/93         Past Medical History:  Diagnosis Date   Acute bronchitis 10/20/2008   Allergic rhinitis 01/09/2008   Alopecia 01/21/2015   Arthritis 06/11/2020   Atrioventricular block, complete 12/16/2009   Blood in urine 02/22/2017   Cardiac pacemaker in situ 01/23/2012   Cervical radiculopathy 07/11/2013   Complete atrioventricular block 12/16/2009   Degenerative joint disease of right hip 12/17/2018   Dizziness 08/03/2012   Essential hypertension 01/09/2008   Gait disorder 08/27/2021   Gastroesophageal reflux disease 01/09/2008   Generalized anxiety disorder 01/09/2008   Genetic testing 06/04/2017   Ms. Yun underwent genetic counseling and testing for hereditary cancer syndromes on 05/24/2017. Her results were  negative for mutations in all 46 genes analyzed by [facility]'s 46-gene Common Hereditary Cancers Panel. Genes analyzed include: APC, ATM, AXIN2, BARD1, BMPR1A, BRCA1, BRCA2, BRIP1, CDH1, CDKN2A, CHEK2, CTNNA1, DICER1, EPCAM, GREM1, HOXB13, KIT, MEN1, MLH1, MSH2, MSH3, MSH6, MUTYH, NBN   History of right mastectomy 01/11/2017   History of thromboembolism of vein 01/09/2008   History of total hip arthroplasty 04/28/2019   Hyperbilirubinemia 12/16/2021   Hyperlipidemia 01/09/2008   Hypoproteinemia 12/16/2021   Hypothyroidism 01/09/2008   Impaired glucose tolerance 07/28/2011   Insomnia 06/15/2009   Left leg swelling 01/21/2015   Low back pain 04/21/2021   Major depressive disorder 01/09/2008   Malignant neoplasm of upper-inner quadrant of female breast 10/06/2016   Mild dementia 06/02/2022   Neck pain 11/04/2019   Normocytic anemia 01/09/2008   Olecranon bursitis of left elbow 05/05/2015   Osteoarthritis cervical spine 02/25/2010   Pacemaker-medtronic 01/23/2012   DOI 1990s Extraction 1997 Generator replacement 2006, 2013   Peripheral venous insufficiency 03/15/2017   Personal history of venous thrombosis and embolism 01/09/2008   Renal calculus, right 02/09/2020   Right hip pain 07/18/2016   Spondylosis 02/25/2010   Status post total hip replacement, right 11/04/2019   Subungual hematoma of foot 03/15/2017   Trigger finger of left hand 07/14/2020   Trigger finger of right hand 07/14/2020   Urinary incontinence 10/09/2019   Vitamin D deficiency 03/05/2022   Past Surgical History:  Procedure Laterality Date  BREAST BIOPSY Right 09/20/2016   malignant   ERCP W/ SPHICTEROTOMY     pt denies   HEMORRHOID SURGERY     pt denies   IR URETERAL STENT RIGHT NEW ACCESS W/O SEP NEPHROSTOMY CATH  02/09/2020   MASTECTOMY Right 01/11/2017   MASTECTOMY W/ SENTINEL NODE BIOPSY Right 01/11/2017   Procedure: RIGHT MASTECTOMY WITH SENTINEL LYMPH NODE BIOPSY;  Surgeon: Almond Lint, MD;  Location: [facility]  OR;  Service: General;  Laterality: Right;   NEPHROLITHOTOMY Right 02/09/2020   Procedure: NEPHROLITHOTOMY PERCUTANEOUS;  Surgeon: Ihor Gully, MD;  Location: [facility] ORS;  Service: Urology;  Laterality: Right;   PACEMAKER PLACEMENT Left    MedTronic EnPulse E2DR01--06 no heartbeat without pacemaker   PERMANENT PACEMAKER GENERATOR CHANGE N/A 07/16/2012   Procedure: PERMANENT PACEMAKER GENERATOR CHANGE;  Surgeon: Duke Salvia, MD;  Location: [facility] CATH LAB;  Service: Cardiovascular;  Laterality: N/A;   POLYPECTOMY     Uterine   PPM GENERATOR CHANGEOUT N/A 04/10/2022   Procedure: PPM GENERATOR CHANGEOUT;  Surgeon: Duke Salvia, MD;  Location[facility] INVASIVE CV LAB;  Service: Cardiovascular;  Laterality: N/A;   TOTAL HIP ARTHROPLASTY Right 04/28/2019   Procedure: RIGHT TOTAL HIP ARTHROPLASTY ANTERIOR APPROACH;  Surgeon: Tarry Kos, MD;  Location: [facility] OR;  Service: Orthopedics;  Laterality: Right;    reports that she quit smoking about 51 years ago. Her smoking use included cigarettes. She started smoking about 76 years ago. She has a 5 pack-year smoking history. She has never used smokeless tobacco. She reports current alcohol use. She reports that she does not use drugs. family history includes Breast cancer in her maternal aunt; Breast cancer (age of onset: 31) in her mother; Lung cancer in her paternal grandfather; Parkinson's disease in her brother; Pulmonary embolism in her father; Schizophrenia in her brother; Sudden death in her mother. Allergies  Allergen Reactions   Codeine Nausea And Vomiting    Pt is unaware of reaction    Oxycodone Nausea And Vomiting    Pt is unaware of reaction    Current Outpatient Medications on File Prior to Visit  Medication Sig Dispense Refill   acetaminophen (TYLENOL) 500 MG tablet Take 500-1,000 mg by mouth every 6 (six) hours as needed for mild pain.     alendronate (FOSAMAX) 70 MG tablet Take 1 tablet (70 mg total) by mouth every 7 (seven) days. Take with a  full glass of water on an empty stomach. 12 tablet 3   atenolol (TENORMIN) 50 MG tablet Take 1 tablet (50 mg total) by mouth daily. 90 tablet 3   atorvastatin (LIPITOR) 40 MG tablet Take 1 tablet by mouth daily. 90 tablet 0   azelastine (ASTELIN) 0.1 % nasal spray Place 1 spray into both nostrils as needed (Summer Allergies). 30 mL 11   donepezil (ARICEPT) 10 MG tablet Take 1 tablet (10 mg total) by mouth at bedtime. 90 tablet 3   enalapril (VASOTEC) 10 MG tablet Take 1 tablet (10 mg total) by mouth daily. 90 tablet 3   levothyroxine (SYNTHROID) 112 MCG tablet Take 1 tablet (112 mcg total) by mouth daily before breakfast. 90 tablet 1   LORazepam (ATIVAN) 0.5 MG tablet Take 1 tablet (0.5 mg total) by mouth 2 (two) times daily as needed for anxiety. 60 tablet 2   Multiple Minerals-Vitamins (CAL-MAG-ZINC-D PO) Take 1 tablet by mouth in the morning and at bedtime.     QUEtiapine (SEROQUEL) 25 MG tablet Take 1 tablet by mouth three times daily.  270 tablet 1   No current facility-administered medications on file prior to visit.        ROS:  All others reviewed and negative.  Objective        PE:  BP 128/82 (BP Location: Right Arm, Patient Position: Sitting, Cuff Size: Normal)   Pulse 60   Temp 98.2 F (36.8 C) (Oral)   Ht 5\' 4"  (1.626 m)   Wt 114 lb (51.7 kg)   SpO2 100%   BMI 19.57 kg/m                 Constitutional: Pt appears in NAD               HENT: Head: NCAT.                Right Ear: External ear normal.                 Left Ear: External ear normal.                Eyes: . Pupils are equal, round, and reactive to light. Conjunctivae and EOM are normal               Nose: without d/c or deformity               Neck: Neck supple. Gross normal ROM               Cardiovascular: Normal rate and regular rhythm.                 Pulmonary/Chest: Effort normal and breath sounds without rales or wheezing. ;  Right chest wall shows a well-healed mastectomy scar with a nodular density 2.5  cm above the scar and 4 cm lateral to the medial edge of the scar                Abd:  Soft, NT, ND, + BS, no organomegaly               Neurological: Pt is alert. At baseline orientation, motor grossly intact               Skin: Skin is warm. No rashes, no other new lesions, LE edema - none               Psychiatric: Pt behavior is normal without agitation   Micro: none  Cardiac tracings I have personally interpreted today:  none  Pertinent Radiological findings (summarize): none   Lab Results  Component Value Date   WBC 7.4 04/17/2023   HGB 11.7 (L) 04/17/2023   HCT 34.8 (L) 04/17/2023   PLT 223.0 04/17/2023   GLUCOSE 90 04/17/2023   CHOL 180 04/17/2023   TRIG 78.0 04/17/2023   HDL 89.00 04/17/2023   LDLDIRECT 135.0 02/27/2022   LDLCALC 75 04/17/2023   ALT 19 04/17/2023   AST 25 04/17/2023   NA 139 04/17/2023   K 4.5 04/17/2023   CL 102 04/17/2023   CREATININE 1.00 04/17/2023   BUN 27 (H) 04/17/2023   CO2 28 04/17/2023   TSH 3.86 04/17/2023   INR 1.0 02/09/2020   HGBA1C 5.5 04/17/2023   MICROALBUR 85.8 (H) 04/17/2023   Assessment/Plan:  ODALYS WEARE is a 87 y.o. White or Caucasian [1] female with  has a past medical history of Acute bronchitis (10/20/2008), Allergic rhinitis (01/09/2008), Alopecia (01/21/2015), Arthritis (06/11/2020), Atrioventricular block, complete (12/16/2009), Blood in urine (02/22/2017), Cardiac pacemaker in situ (01/23/2012), Cervical radiculopathy (07/11/2013),  Complete atrioventricular block (12/16/2009), Degenerative joint disease of right hip (12/17/2018), Dizziness (08/03/2012), Essential hypertension (01/09/2008), Gait disorder (08/27/2021), Gastroesophageal reflux disease (01/09/2008), Generalized anxiety disorder (01/09/2008), Genetic testing (06/04/2017), History of right mastectomy (01/11/2017), History of thromboembolism of vein (01/09/2008), History of total hip arthroplasty (04/28/2019), Hyperbilirubinemia (12/16/2021), Hyperlipidemia  (01/09/2008), Hypoproteinemia (12/16/2021), Hypothyroidism (01/09/2008), Impaired glucose tolerance (07/28/2011), Insomnia (06/15/2009), Left leg swelling (01/21/2015), Low back pain (04/21/2021), Major depressive disorder (01/09/2008), Malignant neoplasm of upper-inner quadrant of female breast (10/06/2016), Mild dementia (06/02/2022), Neck pain (11/04/2019), Normocytic anemia (01/09/2008), Olecranon bursitis of left elbow (05/05/2015), Osteoarthritis cervical spine (02/25/2010), Pacemaker-medtronic (01/23/2012), Peripheral venous insufficiency (03/15/2017), Personal history of venous thrombosis and embolism (01/09/2008), Renal calculus, right (02/09/2020), Right hip pain (07/18/2016), Spondylosis (02/25/2010), Status post total hip replacement, right (11/04/2019), Subungual hematoma of foot (03/15/2017), Trigger finger of left hand (07/14/2020), Trigger finger of right hand (07/14/2020), Urinary incontinence (10/09/2019), and Vitamin D deficiency (03/05/2022).  Malignant neoplasm of upper-inner quadrant of female breast Also for f/u left breast mammogram  Vitamin D deficiency Last vitamin D Lab Results  Component Value Date   VD25OH 26.49 (L) 04/17/2023   Low, to start oral replacement   Essential hypertension BP Readings from Last 3 Encounters:  10/30/23 128/82  08/21/23 (!) 142/68  06/19/23 (!) 159/93   Stable, pt to continue medical treatment tenormin 50 every day, vasotec 10 qd   Nodule of chest wall Etiology unclear, for general surgury referral  Bilateral hearing loss Pt encouraged for OTC hearing aids  Followup: Return in about 6 months (around 04/29/2024).  Oliver Barre, MD 10/30/2023 6:24 PM [facility] Internal Medicine

## 2023-10-31 ENCOUNTER — Other Ambulatory Visit: Payer: Self-pay | Admitting: Internal Medicine

## 2023-10-31 DIAGNOSIS — Z23 Encounter for immunization: Secondary | ICD-10-CM

## 2023-10-31 DIAGNOSIS — E559 Vitamin D deficiency, unspecified: Secondary | ICD-10-CM | POA: Diagnosis not present

## 2023-10-31 DIAGNOSIS — Z171 Estrogen receptor negative status [ER-]: Secondary | ICD-10-CM | POA: Diagnosis not present

## 2023-10-31 DIAGNOSIS — R222 Localized swelling, mass and lump, trunk: Secondary | ICD-10-CM

## 2023-10-31 DIAGNOSIS — H9193 Unspecified hearing loss, bilateral: Secondary | ICD-10-CM | POA: Diagnosis not present

## 2023-10-31 DIAGNOSIS — I10 Essential (primary) hypertension: Secondary | ICD-10-CM | POA: Diagnosis not present

## 2023-10-31 DIAGNOSIS — C50211 Malignant neoplasm of upper-inner quadrant of right female breast: Secondary | ICD-10-CM | POA: Diagnosis not present

## 2023-10-31 NOTE — Addendum Note (Signed)
Addended by: Racheal Patches on: 10/31/2023 09:06 AM   Modules accepted: Orders

## 2023-11-21 ENCOUNTER — Other Ambulatory Visit: Payer: Self-pay | Admitting: Neurology

## 2023-11-22 ENCOUNTER — Other Ambulatory Visit: Payer: Self-pay | Admitting: Anesthesiology

## 2023-12-10 ENCOUNTER — Encounter: Payer: Self-pay | Admitting: Internal Medicine

## 2023-12-14 ENCOUNTER — Other Ambulatory Visit: Payer: Medicare HMO

## 2023-12-21 ENCOUNTER — Other Ambulatory Visit: Payer: Self-pay | Admitting: Neurology

## 2023-12-24 ENCOUNTER — Ambulatory Visit: Payer: Medicare HMO

## 2023-12-24 DIAGNOSIS — I442 Atrioventricular block, complete: Secondary | ICD-10-CM | POA: Diagnosis not present

## 2023-12-25 LAB — CUP PACEART REMOTE DEVICE CHECK
Battery Remaining Longevity: 44 mo
Battery Voltage: 2.98 V
Brady Statistic AP VP Percent: 70.96 %
Brady Statistic AP VS Percent: 0 %
Brady Statistic AS VP Percent: 29.03 %
Brady Statistic AS VS Percent: 0 %
Brady Statistic RA Percent Paced: 70.95 %
Brady Statistic RV Percent Paced: 100 %
Date Time Interrogation Session: 20250202214822
Implantable Lead Connection Status: 753985
Implantable Lead Connection Status: 753985
Implantable Lead Implant Date: 19970117
Implantable Lead Implant Date: 19970117
Implantable Lead Location: 753859
Implantable Lead Location: 753860
Implantable Lead Model: 4068
Implantable Lead Model: 4068
Implantable Pulse Generator Implant Date: 20230522
Lead Channel Impedance Value: 380 Ohm
Lead Channel Impedance Value: 456 Ohm
Lead Channel Impedance Value: 513 Ohm
Lead Channel Impedance Value: 532 Ohm
Lead Channel Pacing Threshold Amplitude: 2.125 V
Lead Channel Pacing Threshold Pulse Width: 0.4 ms
Lead Channel Sensing Intrinsic Amplitude: 1.125 mV
Lead Channel Sensing Intrinsic Amplitude: 1.125 mV
Lead Channel Sensing Intrinsic Amplitude: 10.25 mV
Lead Channel Sensing Intrinsic Amplitude: 10.25 mV
Lead Channel Setting Pacing Amplitude: 2.5 V
Lead Channel Setting Pacing Amplitude: 3.5 V
Lead Channel Setting Pacing Pulse Width: 1.5 ms
Lead Channel Setting Sensing Sensitivity: 5.6 mV
Zone Setting Status: 755011
Zone Setting Status: 755011

## 2023-12-31 ENCOUNTER — Encounter (HOSPITAL_COMMUNITY): Payer: Self-pay | Admitting: Emergency Medicine

## 2023-12-31 ENCOUNTER — Emergency Department (HOSPITAL_COMMUNITY): Payer: Medicare HMO

## 2023-12-31 ENCOUNTER — Inpatient Hospital Stay (HOSPITAL_COMMUNITY)
Admission: EM | Admit: 2023-12-31 | Discharge: 2024-01-06 | DRG: 689 | Disposition: A | Discharge status: Skilled Nursing Facility | Payer: Medicare HMO | Attending: Internal Medicine | Admitting: Internal Medicine

## 2023-12-31 DIAGNOSIS — Z86718 Personal history of other venous thrombosis and embolism: Secondary | ICD-10-CM

## 2023-12-31 DIAGNOSIS — I6782 Cerebral ischemia: Secondary | ICD-10-CM | POA: Diagnosis not present

## 2023-12-31 DIAGNOSIS — Z96641 Presence of right artificial hip joint: Secondary | ICD-10-CM | POA: Diagnosis present

## 2023-12-31 DIAGNOSIS — Z853 Personal history of malignant neoplasm of breast: Secondary | ICD-10-CM | POA: Diagnosis not present

## 2023-12-31 DIAGNOSIS — Z801 Family history of malignant neoplasm of trachea, bronchus and lung: Secondary | ICD-10-CM

## 2023-12-31 DIAGNOSIS — R17 Unspecified jaundice: Secondary | ICD-10-CM | POA: Diagnosis present

## 2023-12-31 DIAGNOSIS — R7401 Elevation of levels of liver transaminase levels: Secondary | ICD-10-CM | POA: Diagnosis present

## 2023-12-31 DIAGNOSIS — Z87440 Personal history of urinary (tract) infections: Secondary | ICD-10-CM

## 2023-12-31 DIAGNOSIS — B9620 Unspecified Escherichia coli [E. coli] as the cause of diseases classified elsewhere: Secondary | ICD-10-CM | POA: Diagnosis present

## 2023-12-31 DIAGNOSIS — Z885 Allergy status to narcotic agent status: Secondary | ICD-10-CM

## 2023-12-31 DIAGNOSIS — I442 Atrioventricular block, complete: Secondary | ICD-10-CM | POA: Diagnosis present

## 2023-12-31 DIAGNOSIS — R41841 Cognitive communication deficit: Secondary | ICD-10-CM | POA: Diagnosis not present

## 2023-12-31 DIAGNOSIS — E785 Hyperlipidemia, unspecified: Secondary | ICD-10-CM | POA: Diagnosis present

## 2023-12-31 DIAGNOSIS — R531 Weakness: Secondary | ICD-10-CM | POA: Diagnosis not present

## 2023-12-31 DIAGNOSIS — E872 Acidosis, unspecified: Secondary | ICD-10-CM | POA: Diagnosis present

## 2023-12-31 DIAGNOSIS — Z7401 Bed confinement status: Secondary | ICD-10-CM | POA: Diagnosis not present

## 2023-12-31 DIAGNOSIS — Z79890 Hormone replacement therapy (postmenopausal): Secondary | ICD-10-CM

## 2023-12-31 DIAGNOSIS — R1311 Dysphagia, oral phase: Secondary | ICD-10-CM | POA: Diagnosis not present

## 2023-12-31 DIAGNOSIS — N3000 Acute cystitis without hematuria: Secondary | ICD-10-CM | POA: Diagnosis not present

## 2023-12-31 DIAGNOSIS — R410 Disorientation, unspecified: Secondary | ICD-10-CM | POA: Diagnosis not present

## 2023-12-31 DIAGNOSIS — E559 Vitamin D deficiency, unspecified: Secondary | ICD-10-CM | POA: Diagnosis present

## 2023-12-31 DIAGNOSIS — E039 Hypothyroidism, unspecified: Secondary | ICD-10-CM | POA: Diagnosis not present

## 2023-12-31 DIAGNOSIS — K219 Gastro-esophageal reflux disease without esophagitis: Secondary | ICD-10-CM | POA: Diagnosis present

## 2023-12-31 DIAGNOSIS — Z820 Family history of epilepsy and other diseases of the nervous system: Secondary | ICD-10-CM

## 2023-12-31 DIAGNOSIS — F03A3 Unspecified dementia, mild, with mood disturbance: Secondary | ICD-10-CM | POA: Diagnosis present

## 2023-12-31 DIAGNOSIS — E860 Dehydration: Secondary | ICD-10-CM | POA: Diagnosis present

## 2023-12-31 DIAGNOSIS — M6282 Rhabdomyolysis: Secondary | ICD-10-CM | POA: Diagnosis not present

## 2023-12-31 DIAGNOSIS — Z66 Do not resuscitate: Secondary | ICD-10-CM | POA: Diagnosis present

## 2023-12-31 DIAGNOSIS — Z79899 Other long term (current) drug therapy: Secondary | ICD-10-CM | POA: Diagnosis not present

## 2023-12-31 DIAGNOSIS — Z87891 Personal history of nicotine dependence: Secondary | ICD-10-CM | POA: Diagnosis not present

## 2023-12-31 DIAGNOSIS — Z818 Family history of other mental and behavioral disorders: Secondary | ICD-10-CM

## 2023-12-31 DIAGNOSIS — F32A Depression, unspecified: Secondary | ICD-10-CM | POA: Diagnosis present

## 2023-12-31 DIAGNOSIS — F03A4 Unspecified dementia, mild, with anxiety: Secondary | ICD-10-CM | POA: Diagnosis present

## 2023-12-31 DIAGNOSIS — Z7983 Long term (current) use of bisphosphonates: Secondary | ICD-10-CM

## 2023-12-31 DIAGNOSIS — M6281 Muscle weakness (generalized): Secondary | ICD-10-CM | POA: Diagnosis not present

## 2023-12-31 DIAGNOSIS — R4182 Altered mental status, unspecified: Secondary | ICD-10-CM

## 2023-12-31 DIAGNOSIS — G9341 Metabolic encephalopathy: Secondary | ICD-10-CM | POA: Diagnosis not present

## 2023-12-31 DIAGNOSIS — M1612 Unilateral primary osteoarthritis, left hip: Secondary | ICD-10-CM | POA: Diagnosis not present

## 2023-12-31 DIAGNOSIS — N179 Acute kidney failure, unspecified: Secondary | ICD-10-CM | POA: Diagnosis present

## 2023-12-31 DIAGNOSIS — Z803 Family history of malignant neoplasm of breast: Secondary | ICD-10-CM

## 2023-12-31 DIAGNOSIS — M25552 Pain in left hip: Secondary | ICD-10-CM | POA: Diagnosis not present

## 2023-12-31 DIAGNOSIS — N390 Urinary tract infection, site not specified: Principal | ICD-10-CM

## 2023-12-31 DIAGNOSIS — I10 Essential (primary) hypertension: Secondary | ICD-10-CM | POA: Diagnosis not present

## 2023-12-31 DIAGNOSIS — R2689 Other abnormalities of gait and mobility: Secondary | ICD-10-CM | POA: Diagnosis not present

## 2023-12-31 DIAGNOSIS — Z950 Presence of cardiac pacemaker: Secondary | ICD-10-CM

## 2023-12-31 DIAGNOSIS — Z832 Family history of diseases of the blood and blood-forming organs and certain disorders involving the immune mechanism: Secondary | ICD-10-CM

## 2023-12-31 DIAGNOSIS — Z9011 Acquired absence of right breast and nipple: Secondary | ICD-10-CM

## 2023-12-31 DIAGNOSIS — S0990XA Unspecified injury of head, initial encounter: Secondary | ICD-10-CM | POA: Diagnosis not present

## 2023-12-31 LAB — URINALYSIS, ROUTINE W REFLEX MICROSCOPIC
Bilirubin Urine: NEGATIVE
Glucose, UA: NEGATIVE mg/dL
Hgb urine dipstick: NEGATIVE
Ketones, ur: NEGATIVE mg/dL
Nitrite: POSITIVE — AB
Protein, ur: 100 mg/dL — AB
Specific Gravity, Urine: 1.018 (ref 1.005–1.030)
WBC, UA: >50 WBC/hpf (ref 0–5)
pH: 5 (ref 5.0–8.0)

## 2023-12-31 LAB — HEPATIC FUNCTION PANEL
ALT: 40 U/L (ref 0–44)
AST: 42 U/L — ABNORMAL HIGH (ref 15–41)
Albumin: 3.4 g/dL — ABNORMAL LOW (ref 3.5–5.0)
Alkaline Phosphatase: 51 U/L (ref 38–126)
Bilirubin, Direct: 0.4 mg/dL — ABNORMAL HIGH (ref 0.0–0.2)
Indirect Bilirubin: 2.1 mg/dL — ABNORMAL HIGH (ref 0.3–0.9)
Total Bilirubin: 2.5 mg/dL — ABNORMAL HIGH (ref 0.0–1.2)
Total Protein: 6.3 g/dL — ABNORMAL LOW (ref 6.5–8.1)

## 2023-12-31 LAB — CBC
HCT: 39.6 % (ref 36.0–46.0)
Hemoglobin: 13.3 g/dL (ref 12.0–15.0)
MCH: 32 pg (ref 26.0–34.0)
MCHC: 33.6 g/dL (ref 30.0–36.0)
MCV: 95.2 fL (ref 80.0–100.0)
Platelets: 167 10*3/uL (ref 150–400)
RBC: 4.16 MIL/uL (ref 3.87–5.11)
RDW: 14.4 % (ref 11.5–15.5)
WBC: 14.6 10*3/uL — ABNORMAL HIGH (ref 4.0–10.5)
nRBC: 0 % (ref 0.0–0.2)

## 2023-12-31 LAB — BASIC METABOLIC PANEL
Anion gap: 18 — ABNORMAL HIGH (ref 5–15)
BUN: 36 mg/dL — ABNORMAL HIGH (ref 8–23)
CO2: 21 mmol/L — ABNORMAL LOW (ref 22–32)
Calcium: 10.6 mg/dL — ABNORMAL HIGH (ref 8.9–10.3)
Chloride: 98 mmol/L (ref 98–111)
Creatinine, Ser: 1.91 mg/dL — ABNORMAL HIGH (ref 0.44–1.00)
GFR, Estimated: 25 mL/min — ABNORMAL LOW (ref >60)
Glucose, Bld: 133 mg/dL — ABNORMAL HIGH (ref 70–99)
Potassium: 4.1 mmol/L (ref 3.5–5.1)
Sodium: 137 mmol/L (ref 135–145)

## 2023-12-31 MED ORDER — LEVOTHYROXINE SODIUM 112 MCG PO TABS
112.0000 ug | ORAL_TABLET | Freq: Every day | ORAL | Status: DC
  Administered 2024-01-01 – 2024-01-06 (×6): 112 ug via ORAL
  Filled 2023-12-31 (×6): qty 1

## 2023-12-31 MED ORDER — SODIUM CHLORIDE 0.9 % IV SOLN
1.0000 g | INTRAVENOUS | Status: AC
  Administered 2024-01-01 – 2024-01-02 (×2): 1 g via INTRAVENOUS
  Filled 2023-12-31 (×2): qty 10

## 2023-12-31 MED ORDER — LACTATED RINGERS IV BOLUS
1000.0000 mL | Freq: Once | INTRAVENOUS | Status: AC
  Administered 2023-12-31: 1000 mL via INTRAVENOUS

## 2023-12-31 MED ORDER — ACETAMINOPHEN 325 MG PO TABS
650.0000 mg | ORAL_TABLET | Freq: Once | ORAL | Status: AC
  Administered 2023-12-31: 650 mg via ORAL
  Filled 2023-12-31: qty 2

## 2023-12-31 MED ORDER — ONDANSETRON HCL 4 MG PO TABS: 4.0000 mg | ORAL_TABLET | Freq: Four times a day (QID) | ORAL | Status: DC | PRN

## 2023-12-31 MED ORDER — LACTATED RINGERS IV SOLN: INTRAVENOUS | Status: AC

## 2023-12-31 MED ORDER — DONEPEZIL HCL 10 MG PO TABS
10.0000 mg | ORAL_TABLET | Freq: Every day | ORAL | Status: DC
  Administered 2023-12-31 – 2024-01-05 (×6): 10 mg via ORAL
  Filled 2023-12-31 (×6): qty 1

## 2023-12-31 MED ORDER — ENOXAPARIN SODIUM 30 MG/0.3ML IJ SOSY
30.0000 mg | PREFILLED_SYRINGE | INTRAMUSCULAR | Status: DC
  Administered 2023-12-31 – 2024-01-05 (×6): 30 mg via SUBCUTANEOUS
  Filled 2023-12-31 (×6): qty 0.3

## 2023-12-31 MED ORDER — QUETIAPINE 12.5 MG HALF TABLET
25.0000 mg | ORAL_TABLET | Freq: Three times a day (TID) | ORAL | Status: DC
  Administered 2023-12-31 – 2024-01-06 (×17): 25 mg via ORAL
  Filled 2023-12-31 (×17): qty 2

## 2023-12-31 MED ORDER — ATORVASTATIN CALCIUM 40 MG PO TABS
40.0000 mg | ORAL_TABLET | Freq: Every day | ORAL | Status: DC
  Administered 2024-01-01: 40 mg via ORAL
  Filled 2023-12-31: qty 1

## 2023-12-31 MED ORDER — ATENOLOL 25 MG PO TABS
50.0000 mg | ORAL_TABLET | Freq: Every day | ORAL | Status: DC
  Administered 2024-01-01: 50 mg via ORAL
  Filled 2023-12-31: qty 2

## 2023-12-31 MED ORDER — SODIUM CHLORIDE 0.9 % IV SOLN
2.0000 g | Freq: Once | INTRAVENOUS | Status: AC
  Administered 2023-12-31: 2 g via INTRAVENOUS
  Filled 2023-12-31: qty 20

## 2023-12-31 MED ORDER — ACETAMINOPHEN 650 MG RE SUPP: 650.0000 mg | Freq: Four times a day (QID) | RECTAL | Status: DC | PRN

## 2023-12-31 MED ORDER — ONDANSETRON HCL 4 MG/2ML IJ SOLN
4.0000 mg | Freq: Four times a day (QID) | INTRAMUSCULAR | Status: DC | PRN
Start: 2023-12-31 — End: 2024-01-06

## 2023-12-31 MED ORDER — ACETAMINOPHEN 325 MG PO TABS
650.0000 mg | ORAL_TABLET | Freq: Four times a day (QID) | ORAL | Status: DC | PRN
  Administered 2024-01-01: 650 mg via ORAL
  Filled 2023-12-31: qty 2

## 2023-12-31 NOTE — H&P (Signed)
 History and Physical  Danielle [name] ZOX:[id number] DOB: 05-Feb-1936 DOA: 12/31/2023  PCP: Roslyn Coombe, MD   Chief Complaint: Altered mental status  HPI: Danielle [name] is a 88 y.o. female with medical history significant for mild dementia, hypothyroidism, HTN, HLD, anxiety and depression, complete AV block s/p PPM, UTIs, and breast cancer presenting to the ED for evaluation of altered mental status. Per daughter, patient has some memory problems due to her mild dementia but lives by herself and independent with all her ADLs except driving. Patient was fine yesterday however when she checked on her today, patient had difficulty standing up. She noticed that patient had not moved out of her bed. Patient seemed confused and did not seem like her usual self.  At baseline, she is alert and oriented x 4. Patient also informed her that she fell last night.  Patient evaluated at bedside alert but only oriented to self and person. Thinks she is at the [facility] in [place].  She is able to tell me her full name, date of birth and identify her daughter and son-in-law. She is disoriented to time. Thinks her left leg gave out prior to the fall but does not remember completely. She endorses chills and dry mouth and unsure if she has any urinary symptoms but denies any fevers, nausea, vomiting, abdominal pain, cough, shortness of breath or chest pain.  ED Course: Stable vitals on arrival.  Labs significant for AKI with creatinine of 1.91, bicarb 21, leukocytosis with WBC of 14.6.  UA shows mild proteinuria, positive nitrite, large leuks, WBC >50, and many bacteria. CTH and hip x-ray with no acute abnormalities.  Patient received IV LR 1 L bolus and IV Rocephin . TRH was consulted for admission  Review of Systems: Please see HPI for pertinent positives and negatives. A complete 10 system review of systems are otherwise negative.  Past Medical History:  Diagnosis Date   Acute bronchitis  10/20/2008   Allergic rhinitis 01/09/2008   Alopecia 01/21/2015   Arthritis 06/11/2020   Atrioventricular block, complete 12/16/2009   Blood in urine 02/22/2017   Cardiac pacemaker in situ 01/23/2012   Cervical radiculopathy 07/11/2013   Complete atrioventricular block 12/16/2009   Degenerative joint disease of right hip 12/17/2018   Dizziness 08/03/2012   Essential hypertension 01/09/2008   Gait disorder 08/27/2021   Gastroesophageal reflux disease 01/09/2008   Generalized anxiety disorder 01/09/2008   Genetic testing 06/04/2017   Ms. Devol underwent genetic counseling and testing for hereditary cancer syndromes on 05/24/2017. Her results were negative for mutations in all 46 genes analyzed by [employer]'s 46-gene Common Hereditary Cancers Panel. Genes analyzed include: APC, ATM, AXIN2, BARD1, BMPR1A, BRCA1, BRCA2, BRIP1, CDH1, CDKN2A, CHEK2, CTNNA1, DICER1, EPCAM, GREM1, HOXB13, KIT, MEN1, MLH1, MSH2, MSH3, MSH6, MUTYH, NBN   History of right mastectomy 01/11/2017   History of thromboembolism of vein 01/09/2008   History of total hip arthroplasty 04/28/2019   Hyperbilirubinemia 12/16/2021   Hyperlipidemia 01/09/2008   Hypoproteinemia 12/16/2021   Hypothyroidism 01/09/2008   Impaired glucose tolerance 07/28/2011   Insomnia 06/15/2009   Left leg swelling 01/21/2015   Low back pain 04/21/2021   Major depressive disorder 01/09/2008   Malignant neoplasm of upper-inner quadrant of female breast 10/06/2016   Mild dementia 06/02/2022   Neck pain 11/04/2019   Normocytic anemia 01/09/2008   Olecranon bursitis of left elbow 05/05/2015   Osteoarthritis cervical spine 02/25/2010   Pacemaker-medtronic 01/23/2012   DOI 1990s Extraction 1997 Generator replacement 2006, 2013  Peripheral venous insufficiency 03/15/2017   Personal history of venous thrombosis and embolism 01/09/2008   Renal calculus, right 02/09/2020   Right hip pain 07/18/2016   Spondylosis 02/25/2010   Status post total hip  replacement, right 11/04/2019   Subungual hematoma of foot 03/15/2017   Trigger finger of left hand 07/14/2020   Trigger finger of right hand 07/14/2020   Urinary incontinence 10/09/2019   Vitamin D  deficiency 03/05/2022   Past Surgical History:  Procedure Laterality Date   BREAST BIOPSY Right 09/20/2016   malignant   ERCP W/ SPHICTEROTOMY     pt denies   HEMORRHOID SURGERY     pt denies   IR URETERAL STENT RIGHT NEW ACCESS W/O SEP NEPHROSTOMY CATH  02/09/2020   MASTECTOMY Right 01/11/2017   MASTECTOMY W/ SENTINEL NODE BIOPSY Right 01/11/2017   Procedure: RIGHT MASTECTOMY WITH SENTINEL LYMPH NODE BIOPSY;  Surgeon: Lockie Rima, MD;  Location: [facility] OR;  Service: General;  Laterality: Right;   NEPHROLITHOTOMY Right 02/09/2020   Procedure: NEPHROLITHOTOMY PERCUTANEOUS;  Surgeon: Trudee Furth, MD;  Location: [facility] ORS;  Service: Urology;  Laterality: Right;   PACEMAKER PLACEMENT Left    MedTronic EnPulse [id number] no heartbeat without pacemaker   PERMANENT PACEMAKER GENERATOR CHANGE N/A 07/16/2012   Procedure: PERMANENT PACEMAKER GENERATOR CHANGE;  Surgeon: Verona Goodwill, MD;  Location: [facility] C[facility] LAB;  Service: Cardiovascular;  Laterality: N/A;   POLYPECTOMY     Uterine   PPM GENERATOR CHANGEOUT N/A 04/10/2022   Procedure: PPM GENERATOR CHANGEOUT;  Surgeon: Verona Goodwill, MD;  Location: [facility] INVASIVE CV LAB;  Service: Cardiovascular;  Laterality: N/A;   TOTAL HIP ARTHROPLASTY Right 04/28/2019   Procedure: RIGHT TOTAL HIP ARTHROPLASTY ANTERIOR APPROACH;  Surgeon: Wes Hamman, MD;  Location: [facility] OR;  Service: Orthopedics;  Laterality: Right;   Social History:  reports that she quit smoking about 52 years ago. Her smoking use included cigarettes. She started smoking about 77 years ago. She has a 5 pack-year smoking history. She has never used smokeless tobacco. She reports current alcohol use. She reports that she does not use drugs.  Allergies  Allergen Reactions   Codeine Nausea And Vomiting     Pt is unaware of reaction    Oxycodone  Nausea And Vomiting    Pt is unaware of reaction     Family History  Problem Relation Age of Onset   Sudden death Mother        d.41s   Breast cancer Mother 89   Pulmonary embolism Father        Possible   Parkinson's disease Brother    Schizophrenia Brother    Lung cancer Paternal Grandfather        history of smoking   Breast cancer Maternal Aunt        d.78s     Prior to Admission medications   Medication Sig Start Date End Date Taking? Authorizing Provider  acetaminophen  (TYLENOL ) 500 MG tablet Take 500-1,000 mg by mouth every 6 (six) hours as needed for mild pain.   Yes [provider]  alendronate  (FOSAMAX ) 70 MG tablet Take 1 tablet (70 mg total) by mouth every 7 (seven) days. Take with a full glass of water on an empty stomach. 12/04/22  Yes Roslyn Coombe, MD  atenolol  (TENORMIN ) 50 MG tablet Take 1 tablet (50 mg total) by mouth daily. 12/04/22  Yes Roslyn Coombe, MD  atorvastatin  (LIPITOR) 40 MG tablet Take 1 tablet by mouth daily. 11/22/23  Yes Camara,  Amadou, MD  donepezil  (ARICEPT ) 10 MG tablet Take 1 tablet by mouth at bedtime. 12/21/23  Yes Camara, Amadou, MD  levothyroxine  (SYNTHROID ) 112 MCG tablet Take 1 tablet (112 mcg total) by mouth daily before breakfast. 08/29/22  Yes Roslyn Coombe, MD  Multiple Minerals-Vitamins (CAL-MAG-ZINC-D PO) Take 1 tablet by mouth in the morning and at bedtime.   Yes [provider]  QUEtiapine  (SEROQUEL ) 25 MG tablet Take 1 tablet by mouth three times daily. 09/02/22  Yes Roslyn Coombe, MD    Physical Exam: BP 110/69   Pulse 85   Temp 98.1 F (36.7 C)   Resp 14   SpO2 98%  General: Pleasant, acutely ill elderly woman laying in bed with intermittent rigors. No acute distress. HEENT: [place]/AT. Anicteric sclera. Dry mucous membrane. CV: RRR. No murmurs, rubs, or gallops. No LE edema Pulmonary: Lungs CTAB. Normal effort. No wheezing or rales. Abdominal: Soft, nontender,  nondistended. Normal bowel sounds. Extremities: Palpable radial and DP pulses. Normal ROM. Skin: Warm and dry. No obvious rash or lesions. Neuro: Alert and oriented to self and person only. Disoriented to place and time. Moves all extremities. Normal sensation to light touch. No focal deficit. Psych: Normal mood and affect          Labs on Admission:  Basic Metabolic Panel: Recent Labs  Lab 12/31/23 1446  NA 137  K 4.1  CL 98  CO2 21*  GLUCOSE 133*  BUN 36*  CREATININE 1.91*  CALCIUM  10.6*   Liver Function Tests: No results for input(s): "AST", "ALT", "ALKPHOS", "BILITOT", "PROT", "ALBUMIN" in the last 168 hours. No results for input(s): "LIPASE", "AMYLASE" in the last 168 hours. No results for input(s): "AMMONIA" in the last 168 hours. CBC: Recent Labs  Lab 12/31/23 1446  WBC 14.6*  HGB 13.3  HCT 39.6  MCV 95.2  PLT 167   Cardiac Enzymes: No results for input(s): "CKTOTAL", "CKMB", "CKMBINDEX", "TROPONINI" in the last 168 hours. BNP (last 3 results) No results for input(s): "BNP" in the last 8760 hours.  ProBNP (last 3 results) No results for input(s): "PROBNP" in the last 8760 hours.  CBG: No results for input(s): "GLUCAP" in the last 168 hours.  Radiological Exams on Admission: DG HIP PORT UNILAT WITH PELVIS 1V LEFT Result Date: 12/31/2023 CLINICAL DATA:  Left hip pain EXAM: DG HIP (WITH OR WITHOUT PELVIS) 1V PORT LEFT COMPARISON:  12/25/2018 FINDINGS: Previous right hip replacement has a good appearance. There is osteoarthritis of the left hip with joint space narrowing and marginal osteophytes. This has worsened slightly since 2020 by radiography. No other regional finding of note. IMPRESSION: 1. Previous right hip replacement with a good appearance. 2. Osteoarthritis of the left hip with joint space narrowing and marginal osteophytes. This has worsened slightly since 2020 by radiography. Electronically Signed   By: Bettylou Brunner M.D.   On: 12/31/2023 18:39   CT  Head Wo Contrast Result Date: 12/31/2023 CLINICAL DATA:  Head trauma, minor (Age >= 65y). Fall with altered mental status. EXAM: CT HEAD WITHOUT CONTRAST TECHNIQUE: Contiguous axial images were obtained from the base of the skull through the vertex without intravenous contrast. RADIATION DOSE REDUCTION: This exam was performed according to the departmental dose-optimization program which includes automated exposure control, adjustment of the mA and/or kV according to patient size and/or use of iterative reconstruction technique. COMPARISON:  Head CT 10/23/2021 FINDINGS: Brain: There is no evidence of an acute infarct, intracranial hemorrhage, mass, midline shift, or extra-axial fluid collection.  Patchy hypodensities in the cerebral white matter are unchanged and nonspecific but compatible with moderate chronic small vessel ischemic disease. Mild cerebral atrophy is likely within normal limits for age. Vascular: Calcified atherosclerosis at the skull base. No hyperdense vessel. Skull: No acute fracture or suspicious osseous lesion. Sinuses/Orbits: Small mucous retention cyst in the left maxillary sinus. Clear mastoid air cells. Unremarkable orbits. Other: None. IMPRESSION: 1. No evidence of acute intracranial abnormality. 2. Moderate chronic small vessel ischemic disease. Electronically Signed   By: Aundra Lee M.D.   On: 12/31/2023 17:31   Assessment/Plan Danielle [name] is a 88 y.o. female with medical history significant for mild dementia, hypothyroidism, HTN, HLD, anxiety and depression, complete AV block s/p PPM, UTIs, and breast cancer presenting to the ED for evaluation of altered mental status and admitted for acute metabolic encephalopathy in the setting of acute cystitis.  # Acute metabolic encephalopathy # Generalized weakness Elderly patient with history of UTIs and mild dementia presenting with acute onset of altered mental status. At baseline she is A&O x 4 however patient currently A&O x 2.  No focal neurodeficits on exam. CT head negative. UA shows signs of infection which is the most likely cause of her encephalopathy. She has leukocytosis and intermittent rigors but afebrile. -Treat UTI as below -Delirium and fall precautions -PT/OT eval  # Acute cystitis Elderly patient with history of UTIs presented with acute encephalopathy and found to have evidence of infection on urinalysis.  She had intermittent rigors on exam.  Labs show mild leukocytosis however patient remains afebrile. -Continue IV Rocephin  -Follow-up urine culture follow-up urine culture -Trend CBC, fever curve  # AKI # AGMA Rising creatinine from normal to 1.91 with associated anion gap metabolic acidosis and mild hypercalcemia, calcium  of 10.6. This is all secondary to dehydration in the setting of acute urinary tract infection and likely decreased p.o. intake. Status post IV LR 1 L bolus in the ED -Give additional IV LR 1 L bolus -Start IV LR infusion 100 cc/h for 10 hours -Avoid nephrotoxic's agents  # HTN # AVB s/p PPM EKG shows atrial sensed, ventricular paced rhythm. HR stable in the 60s to 80s. BP stable with SBP in the 110s to 120s. -Continue atenolol   # HLD -Continue atorvastatin   # Hypothyroidism -Continue Synthroid  -Follow-up repeat TSH  # Mild dementia -Continue Aricept   # Anxiety and depression -Continue Seroquel   DVT prophylaxis: Lovenox      Code Status: Limited: Do not attempt resuscitation (DNR) -DNR-LIMITED -Do Not Intubate/DNI   Consults called: None  Family Communication: Discussed admission with daughter and son-in-law at bedside  Severity of Illness: The appropriate patient status for this patient is OBSERVATION. Observation status is judged to be reasonable and necessary in order to provide the required intensity of service to ensure the patient's safety. The patient's presenting symptoms, physical exam findings, and initial radiographic and laboratory data in the context  of their medical condition is felt to place them at decreased risk for further clinical deterioration. Furthermore, it is anticipated that the patient will be medically stable for discharge from the hospital within 2 midnights of admission.   Level of care:  MedSurg  Vita Grip, MD 12/31/2023, 6:48 PM [facility] Pager: [phone] Isaiah 41:10   If 7PM-7AM, please contact night-coverage [email or web address] Password [email or web address]

## 2023-12-31 NOTE — ED Provider Notes (Signed)
[place] [facility]GENC[facility] DEPARTMENT AT [facility] Provider Note   CSN: [id number] Arrival date & time: 12/31/23  1325     History  Chief Complaint  Patient presents with   Altered Mental Status    Danielle [name] is a 88 y.o. female past medical history of hypothyroidism, HLD, anxiety, MDD, HTN, AV block, GERD, presenting for altered mental status. Additional history is obtained from daughter at bedside. Patient typically lives at home independently and has struggled with some memory problems, but daughter describes her as sharp at baseline. This morning, daughter arrived at mother's house and she seemed altered, off her baseline, unable to stand dependently.  Daughter immediately called EMS.  Patient is unable to provide additional history due to confusion, however does endorse that she thinks she may have had a fall.  Daughter endorses that she does have a abrasion across her nasal arch which is new.  Daughter denies any recent fevers or illness.  She is concerned about possible UTI as patient has had UTIs in the past.  Patient denies chest pain, shortness of breath, abdominal pain, nausea or vomiting   Altered Mental Status      Home Medications Prior to Admission medications   Medication Sig Start Date End Date Taking? Authorizing Provider  acetaminophen (TYLENOL) 500 MG tablet Take 500-1,000 mg by mouth every 6 (six) hours as needed for mild pain.   Yes [provider]  alendronate (FOSAMAX) 70 MG tablet Take 1 tablet (70 mg total) by mouth every 7 (seven) days. Take with a full glass of water on an empty stomach. 12/04/22  Yes Corwin Levins, MD  atenolol (TENORMIN) 50 MG tablet Take 1 tablet (50 mg total) by mouth daily. 12/04/22  Yes Corwin Levins, MD  atorvastatin (LIPITOR) 40 MG tablet Take 1 tablet by mouth daily. 11/22/23  Yes Camara, Amalia Hailey, MD  donepezil (ARICEPT) 10 MG tablet Take 1 tablet by mouth at bedtime. 12/21/23  Yes Windell Norfolk, MD   levothyroxine (SYNTHROID) 112 MCG tablet Take 1 tablet (112 mcg total) by mouth daily before breakfast. 08/29/22  Yes Corwin Levins, MD  Multiple Minerals-Vitamins (CAL-MAG-ZINC-D PO) Take 1 tablet by mouth in the morning and at bedtime.   Yes [provider]  QUEtiapine (SEROQUEL) 25 MG tablet Take 1 tablet by mouth three times daily. 09/02/22  Yes Corwin Levins, MD      Allergies    Codeine and Oxycodone    Review of Systems   Review of Systems  Physical Exam Updated Vital Signs BP 110/69   Pulse 85   Temp 98.1 F (36.7 C)   Resp 14   SpO2 98%  Physical Exam Vitals and nursing note reviewed.  Constitutional:      General: She is not in acute distress.    Appearance: She is well-developed.  HENT:     Head: Normocephalic and atraumatic.     Comments: Small abrasion across nasal arch Eyes:     Conjunctiva/sclera: Conjunctivae normal.  Cardiovascular:     Rate and Rhythm: Normal rate and regular rhythm.     Heart sounds: No murmur heard. Pulmonary:     Effort: Pulmonary effort is normal. No respiratory distress.     Breath sounds: Normal breath sounds.  Abdominal:     Palpations: Abdomen is soft.     Tenderness: There is no abdominal tenderness.  Musculoskeletal:        General: No swelling.     Cervical back: Neck  supple.     Comments: Hip pain elicited on log roll of left leg and on direct palpation of left trochanter  Skin:    General: Skin is warm and dry.     Capillary Refill: Capillary refill takes less than 2 seconds.     Coloration: Skin is not jaundiced.  Neurological:     Mental Status: She is alert.     Comments: AAOx1 to self. Good strength in all four extremities.     ED Results / Procedures / Treatments   Labs (all labs ordered are listed, but only abnormal results are displayed) Labs Reviewed  BASIC METABOLIC PANEL - Abnormal; Notable for the following components:      Result Value   CO2 21 (*)    Glucose, Bld 133 (*)    BUN 36 (*)     Creatinine, Ser 1.91 (*)    Calcium 10.6 (*)    GFR, Estimated 25 (*)    Anion gap 18 (*)    All other components within normal limits  CBC - Abnormal; Notable for the following components:   WBC 14.6 (*)    All other components within normal limits  URINALYSIS, ROUTINE W REFLEX MICROSCOPIC - Abnormal; Notable for the following components:   Color, Urine AMBER (*)    APPearance CLOUDY (*)    Protein, ur 100 (*)    Nitrite POSITIVE (*)    Leukocytes,Ua LARGE (*)    Bacteria, UA MANY (*)    Non Squamous Epithelial 6-10 (*)    All other components within normal limits  URINE CULTURE  HEPATIC FUNCTION PANEL  CBG MONITORING, ED    EKG None  Radiology DG HIP PORT UNILAT WITH PELVIS 1V LEFT Result Date: 12/31/2023 CLINICAL DATA:  Left hip pain EXAM: DG HIP (WITH OR WITHOUT PELVIS) 1V PORT LEFT COMPARISON:  12/25/2018 FINDINGS: Previous right hip replacement has a good appearance. There is osteoarthritis of the left hip with joint space narrowing and marginal osteophytes. This has worsened slightly since 2020 by radiography. No other regional finding of note. IMPRESSION: 1. Previous right hip replacement with a good appearance. 2. Osteoarthritis of the left hip with joint space narrowing and marginal osteophytes. This has worsened slightly since 2020 by radiography. Electronically Signed   By: Paulina Fusi M.D.   On: 12/31/2023 18:39   CT Head Wo Contrast Result Date: 12/31/2023 CLINICAL DATA:  Head trauma, minor (Age >= 65y). Fall with altered mental status. EXAM: CT HEAD WITHOUT CONTRAST TECHNIQUE: Contiguous axial images were obtained from the base of the skull through the vertex without intravenous contrast. RADIATION DOSE REDUCTION: This exam was performed according to the departmental dose-optimization program which includes automated exposure control, adjustment of the mA and/or kV according to patient size and/or use of iterative reconstruction technique. COMPARISON:  Head CT  10/23/2021 FINDINGS: Brain: There is no evidence of an acute infarct, intracranial hemorrhage, mass, midline shift, or extra-axial fluid collection. Patchy hypodensities in the cerebral white matter are unchanged and nonspecific but compatible with moderate chronic small vessel ischemic disease. Mild cerebral atrophy is likely within normal limits for age. Vascular: Calcified atherosclerosis at the skull base. No hyperdense vessel. Skull: No acute fracture or suspicious osseous lesion. Sinuses/Orbits: Small mucous retention cyst in the left maxillary sinus. Clear mastoid air cells. Unremarkable orbits. Other: None. IMPRESSION: 1. No evidence of acute intracranial abnormality. 2. Moderate chronic small vessel ischemic disease. Electronically Signed   By: Sebastian Ache M.D.   On: 12/31/2023 17:31  Procedures Procedures    Medications Ordered in ED Medications  cefTRIAXone (ROCEPHIN) 2 g in sodium chloride 0.9 % 100 mL IVPB (has no administration in time range)  lactated ringers bolus 1,000 mL (1,000 mLs Intravenous New Bag/Given 12/31/23 1739)    ED Course/ Medical Decision Making/ A&P Clinical Course as of 12/31/23 2033  Mon Dec 31, 2023  1801 F/u UTI Admit for AMS [RK]  1904 Admit to hosp [RK]    Clinical Course User Index [RK] Caron Presume, MD                                 Medical Decision Making Amount and/or Complexity of Data Reviewed Labs: ordered. Radiology: ordered.  Risk OTC drugs. Decision regarding hospitalization.   This is an 88 year old patient presenting with altered mental status since this morning. Daughter is at bedside to provide additional history. On exam, patient is alert and oriented to self only, or she is typically independent with ADLs and ANO x 4. She does have some mild tenderness to palpation at the left hip. Unclear if patient had a fall as she does not remember.  Her CT head is reassuring with no sign of skull fracture or ICH.  Her x-ray of  her left hip does not show any obvious fracture or malalignment.  Her lab work she is concerned for an AKI with creatinine of 1.91 as well as urinalysis concerning for UTI with positive nitrates and leukocyte esterase and many bacteria.  Patient was given a liter of crystalloid fluid in the setting of her AKI. She was also given a dose of Rocephin for UTI, she has no urine culture was in the past that show any resistance patterns.  Patient will be admitted to hospitalist team for further management of her UTI with AMS        Final Clinical Impression(s) / ED Diagnoses Final diagnoses:  Urinary tract infection without hematuria, site unspecified  Altered mental status, unspecified altered mental status type    Rx / DC Orders ED Discharge Orders     None         Caron Presume, MD 12/31/23 2034    Rondel Baton, MD 01/04/24 [phone]

## 2023-12-31 NOTE — ED Notes (Signed)
 ED TO INPATIENT HANDOFF REPORT  ED Nurse Name and Phone #:   S Name/Age/Gender Danielle [name] 88 y.o. female Room/Bed: 020C/020C  Code Status   Code Status: Limited: Do not attempt resuscitation (DNR) -DNR-LIMITED -Do Not Intubate/DNI   Home/SNF/Other Home Patient oriented to: self and place Is this baseline? No   Triage Complete: Triage complete  Chief Complaint Acute metabolic encephalopathy [G93.41]  Triage Note PT BIB [facility] from home with reports of AMS and dysuria. Pt oriented x 2. Family denies fevers.    Allergies Allergies  Allergen Reactions   Codeine Nausea And Vomiting    Pt is unaware of reaction    Oxycodone  Nausea And Vomiting    Pt is unaware of reaction     Level of Care/Admitting Diagnosis ED Disposition     ED Disposition  Admit   Condition  --   Comment  Hospital Area: [facility] [[id number]]  Level of Care: Med-Surg [16]  May place patient in observation at [facility] or [facility] if equivalent level of care is available:: No  Covid Evaluation: Asymptomatic - no recent exposure (last 10 days) testing not required  Diagnosis: Acute metabolic encephalopathy [[id number]]  Admitting Physician: Vita Grip [[id number]]  Attending Physician: Vita Grip [[id number]]          B Medical/Surgery History Past Medical History:  Diagnosis Date   Acute bronchitis 10/20/2008   Allergic rhinitis 01/09/2008   Alopecia 01/21/2015   Arthritis 06/11/2020   Atrioventricular block, complete 12/16/2009   Blood in urine 02/22/2017   Cardiac pacemaker in situ 01/23/2012   Cervical radiculopathy 07/11/2013   Complete atrioventricular block 12/16/2009   Degenerative joint disease of right hip 12/17/2018   Dizziness 08/03/2012   Essential hypertension 01/09/2008   Gait disorder 08/27/2021   Gastroesophageal reflux disease 01/09/2008   Generalized anxiety disorder 01/09/2008   Genetic testing 06/04/2017   Ms. Sturdivant  underwent genetic counseling and testing for hereditary cancer syndromes on 05/24/2017. Her results were negative for mutations in all 46 genes analyzed by [employer]'s 46-gene Common Hereditary Cancers Panel. Genes analyzed include: APC, ATM, AXIN2, BARD1, BMPR1A, BRCA1, BRCA2, BRIP1, CDH1, CDKN2A, CHEK2, CTNNA1, DICER1, EPCAM, GREM1, HOXB13, KIT, MEN1, MLH1, MSH2, MSH3, MSH6, MUTYH, NBN   History of right mastectomy 01/11/2017   History of thromboembolism of vein 01/09/2008   History of total hip arthroplasty 04/28/2019   Hyperbilirubinemia 12/16/2021   Hyperlipidemia 01/09/2008   Hypoproteinemia 12/16/2021   Hypothyroidism 01/09/2008   Impaired glucose tolerance 07/28/2011   Insomnia 06/15/2009   Left leg swelling 01/21/2015   Low back pain 04/21/2021   Major depressive disorder 01/09/2008   Malignant neoplasm of upper-inner quadrant of female breast 10/06/2016   Mild dementia 06/02/2022   Neck pain 11/04/2019   Normocytic anemia 01/09/2008   Olecranon bursitis of left elbow 05/05/2015   Osteoarthritis cervical spine 02/25/2010   Pacemaker-medtronic 01/23/2012   DOI 1990s Extraction 1997 Generator replacement 2006, 2013   Peripheral venous insufficiency 03/15/2017   Personal history of venous thrombosis and embolism 01/09/2008   Renal calculus, right 02/09/2020   Right hip pain 07/18/2016   Spondylosis 02/25/2010   Status post total hip replacement, right 11/04/2019   Subungual hematoma of foot 03/15/2017   Trigger finger of left hand 07/14/2020   Trigger finger of right hand 07/14/2020   Urinary incontinence 10/09/2019   Vitamin D  deficiency 03/05/2022   Past Surgical History:  Procedure Laterality Date   BREAST BIOPSY Right 09/20/2016  malignant   ERCP W/ SPHICTEROTOMY     pt denies   HEMORRHOID SURGERY     pt denies   IR URETERAL STENT RIGHT NEW ACCESS W/O SEP NEPHROSTOMY CATH  02/09/2020   MASTECTOMY Right 01/11/2017   MASTECTOMY W/ SENTINEL NODE BIOPSY Right 01/11/2017    Procedure: RIGHT MASTECTOMY WITH SENTINEL LYMPH NODE BIOPSY;  Surgeon: Lockie Rima, MD;  Location: [facility] OR;  Service: General;  Laterality: Right;   NEPHROLITHOTOMY Right 02/09/2020   Procedure: NEPHROLITHOTOMY PERCUTANEOUS;  Surgeon: Ottelin, Mark, MD;  Location: [facility] ORS;  Service: Urology;  Laterality: Right;   PACEMAKER PLACEMENT Left    MedTronic EnPulse [id number] no heartbeat without pacemaker   PERMANENT PACEMAKER GENERATOR CHANGE N/A 07/16/2012   Procedure: PERMANENT PACEMAKER GENERATOR CHANGE;  Surgeon: Verona Goodwill, MD;  Location: [facility] CATH LAB;  Service: Cardiovascular;  Laterality: N/A;   POLYPECTOMY     Uterine   PPM GENERATOR CHANGEOUT N/A 04/10/2022   Procedure: PPM GENERATOR CHANGEOUT;  Surgeon: Verona Goodwill, MD;  Location: [facility] INVASIVE CV LAB;  Service: Cardiovascular;  Laterality: N/A;   TOTAL HIP ARTHROPLASTY Right 04/28/2019   Procedure: RIGHT TOTAL HIP ARTHROPLASTY ANTERIOR APPROACH;  Surgeon: Wes Hamman, MD;  Location: [facility] OR;  Service: Orthopedics;  Laterality: Right;     A IV Location/Drains/Wounds Patient Lines/Drains/Airways Status     Active Line/Drains/Airways     Name Placement date Placement time Site Days   Peripheral IV 12/31/23 20 G 1" Right Antecubital 12/31/23  1737  Antecubital  less than 1            Intake/Output Last 24 hours No intake or output data in the 24 hours ending 12/31/23 1928  Labs/Imaging Results for orders placed or performed during the hospital encounter of 12/31/23 (from the past 48 hours)  Basic metabolic panel     Status: Abnormal   Collection Time: 12/31/23  2:46 PM  Result Value Ref Range   Sodium 137 135 - 145 mmol/L   Potassium 4.1 3.5 - 5.1 mmol/L   Chloride 98 98 - 111 mmol/L   CO2 21 (L) 22 - 32 mmol/L   Glucose, Bld 133 (H) 70 - 99 mg/dL    Comment: Glucose reference range applies only to samples taken after fasting for at least 8 hours.   BUN 36 (H) 8 - 23 mg/dL   Creatinine, Ser 5.78 (H) 0.44 - 1.00 mg/dL    Calcium  10.6 (H) 8.9 - 10.3 mg/dL   GFR, Estimated 25 (L) >60 mL/min    Comment: (NOTE) Calculated using the CKD-EPI Creatinine Equation (2021)    Anion gap 18 (H) 5 - 15    Comment: Performed at [facility], [address] [place], [place] [address]  CBC     Status: Abnormal   Collection Time: 12/31/23  2:46 PM  Result Value Ref Range   WBC 14.6 (H) 4.0 - 10.5 K/uL   RBC 4.16 3.87 - 5.11 MIL/uL   Hemoglobin 13.3 12.0 - 15.0 g/dL   HCT 95.2 84.1 - 32.4 %   MCV 95.2 80.0 - 100.0 fL   MCH 32.0 26.0 - 34.0 pg   MCHC 33.6 30.0 - 36.0 g/dL   RDW 40.1 02.7 - 25.3 %   Platelets 167 150 - 400 K/uL   nRBC 0.0 0.0 - 0.2 %    Comment: Performed at [facility], [address] [place], [place] [address]  Urinalysis, Routine w reflex microscopic -Urine, Clean Catch  Status: Abnormal   Collection Time: 12/31/23  5:41 PM  Result Value Ref Range   Color, Urine AMBER (A) YELLOW    Comment: BIOCHEMICALS MAY BE AFFECTED BY COLOR   APPearance CLOUDY (A) CLEAR   Specific Gravity, Urine 1.018 1.005 - 1.030   pH 5.0 5.0 - 8.0   Glucose, UA NEGATIVE NEGATIVE mg/dL   Hgb urine dipstick NEGATIVE NEGATIVE   Bilirubin Urine NEGATIVE NEGATIVE   Ketones, ur NEGATIVE NEGATIVE mg/dL   Protein, ur 010 (A) NEGATIVE mg/dL   Nitrite POSITIVE (A) NEGATIVE   Leukocytes,Ua LARGE (A) NEGATIVE   RBC / HPF 0-5 0 - 5 RBC/hpf   WBC, UA >50 0 - 5 WBC/hpf   Bacteria, UA MANY (A) NONE SEEN   Squamous Epithelial / HPF 0-5 0 - 5 /HPF   Non Squamous Epithelial 6-10 (A) NONE SEEN    Comment: Performed at [facility], [address], [place], [place] [address]   DG HIP PORT UNILAT WITH PELVIS 1V LEFT Result Date: 12/31/2023 CLINICAL DATA:  Left hip pain EXAM: DG HIP (WITH OR WITHOUT PELVIS) 1V PORT LEFT COMPARISON:  12/25/2018 FINDINGS: Previous right hip replacement has a good appearance. There is osteoarthritis of the left hip with joint space narrowing and marginal osteophytes. This has  worsened slightly since 2020 by radiography. No other regional finding of note. IMPRESSION: 1. Previous right hip replacement with a good appearance. 2. Osteoarthritis of the left hip with joint space narrowing and marginal osteophytes. This has worsened slightly since 2020 by radiography. Electronically Signed   By: Bettylou Brunner M.D.   On: 12/31/2023 18:39   CT Head Wo Contrast Result Date: 12/31/2023 CLINICAL DATA:  Head trauma, minor (Age >= 65y). Fall with altered mental status. EXAM: CT HEAD WITHOUT CONTRAST TECHNIQUE: Contiguous axial images were obtained from the base of the skull through the vertex without intravenous contrast. RADIATION DOSE REDUCTION: This exam was performed according to the departmental dose-optimization program which includes automated exposure control, adjustment of the mA and/or kV according to patient size and/or use of iterative reconstruction technique. COMPARISON:  Head CT 10/23/2021 FINDINGS: Brain: There is no evidence of an acute infarct, intracranial hemorrhage, mass, midline shift, or extra-axial fluid collection. Patchy hypodensities in the cerebral white matter are unchanged and nonspecific but compatible with moderate chronic small vessel ischemic disease. Mild cerebral atrophy is likely within normal limits for age. Vascular: Calcified atherosclerosis at the skull base. No hyperdense vessel. Skull: No acute fracture or suspicious osseous lesion. Sinuses/Orbits: Small mucous retention cyst in the left maxillary sinus. Clear mastoid air cells. Unremarkable orbits. Other: None. IMPRESSION: 1. No evidence of acute intracranial abnormality. 2. Moderate chronic small vessel ischemic disease. Electronically Signed   By: Aundra Lee M.D.   On: 12/31/2023 17:31    Pending Labs Unresulted Labs (From admission, onward)     Start     Ordered   12/31/23 1518  Hepatic function panel  Add-on,   AD        12/31/23 1517   12/31/23 1514  Urine Culture  Once,   URGENT        Question:  Indication  Answer:  Altered mental status (if no other cause identified)   12/31/23 1515            Vitals/Pain Today's Vitals   12/31/23 1731 12/31/23 1800 12/31/23 1830 12/31/23 1900  BP:  123/70 (!) 135/55 (!) 147/101  Pulse:  80 80 98  Resp:  16  15 (!) 24  Temp: 98.1 F (36.7 C)     TempSrc:      SpO2:  100% 100% 100%    Isolation Precautions No active isolations  Medications Medications  cefTRIAXone  (ROCEPHIN ) 2 g in sodium chloride  0.9 % 100 mL IVPB (has no administration in time range)  acetaminophen  (TYLENOL ) tablet 650 mg (has no administration in time range)  enoxaparin  (LOVENOX ) injection 30 mg (has no administration in time range)  acetaminophen  (TYLENOL ) tablet 650 mg (has no administration in time range)    Or  acetaminophen  (TYLENOL ) suppository 650 mg (has no administration in time range)  ondansetron  (ZOFRAN ) tablet 4 mg (has no administration in time range)    Or  ondansetron  (ZOFRAN ) injection 4 mg (has no administration in time range)  atenolol  (TENORMIN ) tablet 50 mg (has no administration in time range)  atorvastatin  (LIPITOR) tablet 40 mg (has no administration in time range)  donepezil  (ARICEPT ) tablet 10 mg (has no administration in time range)  levothyroxine  (SYNTHROID ) tablet 112 mcg (has no administration in time range)  QUEtiapine  (SEROQUEL ) tablet 25 mg (has no administration in time range)  lactated ringers  bolus 1,000 mL (has no administration in time range)  lactated ringers  bolus 1,000 mL (1,000 mLs Intravenous New Bag/Given 12/31/23 1739)    Mobility walks with device     Focused Assessments    R Recommendations: See Admitting Provider Note  Report given to:   Additional Notes:

## 2023-12-31 NOTE — ED Triage Notes (Signed)
 PT BIB [facility] from home with reports of AMS and dysuria. Pt oriented x 2. Family denies fevers.

## 2024-01-01 ENCOUNTER — Other Ambulatory Visit: Payer: Self-pay

## 2024-01-01 DIAGNOSIS — E872 Acidosis, unspecified: Secondary | ICD-10-CM | POA: Diagnosis present

## 2024-01-01 DIAGNOSIS — Z885 Allergy status to narcotic agent status: Secondary | ICD-10-CM | POA: Diagnosis not present

## 2024-01-01 DIAGNOSIS — Z853 Personal history of malignant neoplasm of breast: Secondary | ICD-10-CM | POA: Diagnosis not present

## 2024-01-01 DIAGNOSIS — I442 Atrioventricular block, complete: Secondary | ICD-10-CM | POA: Diagnosis present

## 2024-01-01 DIAGNOSIS — Z87891 Personal history of nicotine dependence: Secondary | ICD-10-CM | POA: Diagnosis not present

## 2024-01-01 DIAGNOSIS — R4182 Altered mental status, unspecified: Secondary | ICD-10-CM

## 2024-01-01 DIAGNOSIS — Z66 Do not resuscitate: Secondary | ICD-10-CM | POA: Diagnosis present

## 2024-01-01 DIAGNOSIS — K219 Gastro-esophageal reflux disease without esophagitis: Secondary | ICD-10-CM | POA: Diagnosis present

## 2024-01-01 DIAGNOSIS — N179 Acute kidney failure, unspecified: Secondary | ICD-10-CM

## 2024-01-01 DIAGNOSIS — N390 Urinary tract infection, site not specified: Secondary | ICD-10-CM | POA: Diagnosis present

## 2024-01-01 DIAGNOSIS — I10 Essential (primary) hypertension: Secondary | ICD-10-CM | POA: Diagnosis present

## 2024-01-01 DIAGNOSIS — F03A3 Unspecified dementia, mild, with mood disturbance: Secondary | ICD-10-CM | POA: Diagnosis present

## 2024-01-01 DIAGNOSIS — E785 Hyperlipidemia, unspecified: Secondary | ICD-10-CM | POA: Diagnosis present

## 2024-01-01 DIAGNOSIS — B9620 Unspecified Escherichia coli [E. coli] as the cause of diseases classified elsewhere: Secondary | ICD-10-CM | POA: Diagnosis present

## 2024-01-01 DIAGNOSIS — R7401 Elevation of levels of liver transaminase levels: Secondary | ICD-10-CM | POA: Diagnosis present

## 2024-01-01 DIAGNOSIS — N3000 Acute cystitis without hematuria: Secondary | ICD-10-CM | POA: Diagnosis present

## 2024-01-01 DIAGNOSIS — Z87440 Personal history of urinary (tract) infections: Secondary | ICD-10-CM | POA: Diagnosis not present

## 2024-01-01 DIAGNOSIS — G9341 Metabolic encephalopathy: Secondary | ICD-10-CM | POA: Diagnosis present

## 2024-01-01 DIAGNOSIS — M6282 Rhabdomyolysis: Secondary | ICD-10-CM | POA: Diagnosis present

## 2024-01-01 DIAGNOSIS — F03A4 Unspecified dementia, mild, with anxiety: Secondary | ICD-10-CM | POA: Diagnosis present

## 2024-01-01 DIAGNOSIS — Z79899 Other long term (current) drug therapy: Secondary | ICD-10-CM | POA: Diagnosis not present

## 2024-01-01 DIAGNOSIS — E860 Dehydration: Secondary | ICD-10-CM | POA: Diagnosis present

## 2024-01-01 DIAGNOSIS — R17 Unspecified jaundice: Secondary | ICD-10-CM | POA: Diagnosis present

## 2024-01-01 DIAGNOSIS — E559 Vitamin D deficiency, unspecified: Secondary | ICD-10-CM | POA: Diagnosis present

## 2024-01-01 DIAGNOSIS — E039 Hypothyroidism, unspecified: Secondary | ICD-10-CM | POA: Diagnosis present

## 2024-01-01 DIAGNOSIS — F32A Depression, unspecified: Secondary | ICD-10-CM | POA: Diagnosis present

## 2024-01-01 LAB — COMPREHENSIVE METABOLIC PANEL
ALT: 41 U/L (ref 0–44)
AST: 48 U/L — ABNORMAL HIGH (ref 15–41)
Albumin: 3.2 g/dL — ABNORMAL LOW (ref 3.5–5.0)
Alkaline Phosphatase: 58 U/L (ref 38–126)
Anion gap: 11 (ref 5–15)
BUN: 38 mg/dL — ABNORMAL HIGH (ref 8–23)
CO2: 24 mmol/L (ref 22–32)
Calcium: 9.3 mg/dL (ref 8.9–10.3)
Chloride: 100 mmol/L (ref 98–111)
Creatinine, Ser: 1.66 mg/dL — ABNORMAL HIGH (ref 0.44–1.00)
GFR, Estimated: 30 mL/min — ABNORMAL LOW (ref >60)
Glucose, Bld: 125 mg/dL — ABNORMAL HIGH (ref 70–99)
Potassium: 4.1 mmol/L (ref 3.5–5.1)
Sodium: 135 mmol/L (ref 135–145)
Total Bilirubin: 1.9 mg/dL — ABNORMAL HIGH (ref 0.0–1.2)
Total Protein: 6.2 g/dL — ABNORMAL LOW (ref 6.5–8.1)

## 2024-01-01 LAB — CBC
HCT: 35.4 % — ABNORMAL LOW (ref 36.0–46.0)
Hemoglobin: 12.1 g/dL (ref 12.0–15.0)
MCH: 32.3 pg (ref 26.0–34.0)
MCHC: 34.2 g/dL (ref 30.0–36.0)
MCV: 94.4 fL (ref 80.0–100.0)
Platelets: 147 10*3/uL — ABNORMAL LOW (ref 150–400)
RBC: 3.75 MIL/uL — ABNORMAL LOW (ref 3.87–5.11)
RDW: 14.5 % (ref 11.5–15.5)
WBC: 11.2 10*3/uL — ABNORMAL HIGH (ref 4.0–10.5)
nRBC: 0 % (ref 0.0–0.2)

## 2024-01-01 LAB — TSH: TSH: 143.251 u[IU]/mL — ABNORMAL HIGH (ref 0.350–4.500)

## 2024-01-01 LAB — VITAMIN D 25 HYDROXY (VIT D DEFICIENCY, FRACTURES): Vit D, 25-Hydroxy: 19.3 ng/mL — ABNORMAL LOW (ref 30–100)

## 2024-01-01 LAB — AMMONIA: Ammonia: 11 umol/L (ref 9–35)

## 2024-01-01 LAB — CK: Total CK: 1132 U/L — ABNORMAL HIGH (ref 38–234)

## 2024-01-01 MED ORDER — VITAMIN D 25 MCG (1000 UNIT) PO TABS
1000.0000 [IU] | ORAL_TABLET | Freq: Every day | ORAL | Status: DC
  Administered 2024-01-01 – 2024-01-06 (×6): 1000 [IU] via ORAL
  Filled 2024-01-01 (×6): qty 1

## 2024-01-01 MED ORDER — LACTATED RINGERS IV SOLN: INTRAVENOUS | Status: AC

## 2024-01-01 NOTE — Plan of Care (Signed)
  Problem: Pain Managment: Goal: General experience of comfort will improve and/or be controlled Outcome: Progressing   Problem: Safety: Goal: Ability to remain free from injury will improve Outcome: Progressing

## 2024-01-01 NOTE — Plan of Care (Signed)
  Problem: Pain Managment: Goal: General experience of comfort will improve and/or be controlled 01/01/2024 0610 by Sammuel Cooper, RN Outcome: Progressing 01/01/2024 0143 by Sammuel Cooper, RN Outcome: Progressing   Problem: Safety: Goal: Ability to remain free from injury will improve 01/01/2024 0610 by Sammuel Cooper, RN Outcome: Progressing 01/01/2024 0143 by Sammuel Cooper, RN Outcome: Progressing

## 2024-01-01 NOTE — Evaluation (Addendum)
Physical Therapy Evaluation Patient Details Name: Danielle [name] MRN: [id number] DOB: 12/23/1935 Today's Date: 01/01/2024  History of Present Illness  TAMETRA AHART is a 88 y.o. female presenting to the ED for evaluation of altered mental status and fall. Found to have UTI.  PHMx: mild dementia, hypothyroidism, HTN, HLD, anxiety and depression, complete AV block s/p PPM, UTIs, and breast cancer  Clinical Impression  PTA, pt lives alone and is independent with mobility/ADL's. Pt daughter assists with finances. Pt presents with gross change in baseline with deficits in cognition, balance, gait and strength. Pt A&O to self only. Requires step by step cues and hand over hand guidance for ADL task of washing hands at sink. Pt ambulating 60 ft with no assistive device and minA for balance; frequently reaching out for additional external support. Displays shuffling gait with decreased speed and bilateral foot clearance, increasing risk for falls. Pt daughter reports 24/7 assist is not available, which is recommended, therefore, patient will benefit from continued inpatient follow up therapy, <3 hours/day. Potential to progress home if cognition significantly improves.       If plan is discharge home, recommend the following: A little help with walking and/or transfers;A little help with bathing/dressing/bathroom;Assistance with cooking/housework;Direct supervision/assist for medications management;Direct supervision/assist for financial management;Assist for transportation;Help with stairs or ramp for entrance   Can travel by private vehicle   Yes    Equipment Recommendations None recommended by PT  Recommendations for Other Services       Functional Status Assessment       Precautions / Restrictions Precautions Precautions: Fall Restrictions Weight Bearing Restrictions Per Provider Order: No      Mobility  Bed Mobility Overal bed mobility: Needs Assistance Bed Mobility: Supine  to Sit     Supine to sit: Supervision     General bed mobility comments: able to negotiate to edge of bed with cues for initiation    Transfers Overall transfer level: Needs assistance Equipment used: None Transfers: Sit to/from Stand Sit to Stand: Min assist           General transfer comment: minA to boost up to standing position from edge of bed and toilet    Ambulation/Gait Ambulation/Gait assistance: Min assist Gait Distance (Feet): 60 Feet Assistive device: None Gait Pattern/deviations: Step-through pattern, Decreased stride length, Shuffle Gait velocity: decreased Gait velocity interpretation: <1.31 ft/sec, indicative of household ambulator   General Gait Details: Shuffling gait pattern, consistent hands on assist for balance and guidance  Stairs            Wheelchair Mobility     Tilt Bed    Modified Rankin (Stroke Patients Only)       Balance Overall balance assessment: Needs assistance Sitting-balance support: Feet supported       Standing balance support: No upper extremity supported, During functional activity Standing balance-Leahy Scale: Poor Standing balance comment: reliant on external support from therapist                             Pertinent Vitals/Pain Pain Assessment Pain Assessment: No/denies pain    Home Living Family/patient expects to be discharged to:: Private residence Living Arrangements: Alone Available Help at Discharge: Family;Available PRN/intermittently Type of Home: House Home Access: Stairs to enter Entrance Stairs-Rails: Doctor, general practice of Steps: 4   Home Layout: One level Home Equipment: Shower seat;Cane - single point Additional Comments: Pt family checks in or calls several times a  week. Just approved for Meals on Wheels program. PTA pt able to read, write, carry on conversation, garden.    Prior Function Prior Level of Function : Independent/Modified Independent              Mobility Comments: independent ADLs Comments: independent ADL's/most IADL's. pt daughter manages finances, pt manages own medications.     Extremity/Trunk Assessment   Upper Extremity Assessment Upper Extremity Assessment: Overall WFL for tasks assessed    Lower Extremity Assessment Lower Extremity Assessment: Overall WFL for tasks assessed    Cervical / Trunk Assessment Cervical / Trunk Assessment: Normal  Communication   Communication Communication: No apparent difficulties    Cognition Arousal: Alert Behavior During Therapy: WFL for tasks assessed/performed   PT - Cognitive impairments: History of cognitive impairments                       PT - Cognition Comments: Pt with hx of dementia, but demonstrates acute confusion, A&Ox1, decreased recall Following commands: Impaired Following commands impaired: Follows one step commands inconsistently     Cueing Cueing Techniques: Tactile cues, Verbal cues     General Comments      Exercises     Assessment/Plan    PT Assessment Patient needs continued PT services  PT Problem List Decreased strength;Decreased activity tolerance;Decreased balance;Decreased mobility;Decreased cognition;Decreased safety awareness       PT Treatment Interventions Gait training;Functional mobility training;Therapeutic activities;Therapeutic exercise;Balance training;Patient/family education;Stair training    PT Goals (Current goals can be found in the Care Plan section)  Acute Rehab PT Goals Patient Stated Goal: pt daughter would like pt to return to baseline PT Goal Formulation: With patient/family Time For Goal Achievement: 01/15/24 Potential to Achieve Goals: Fair    Frequency Min 1X/week     Co-evaluation               AM-PAC PT "6 Clicks" Mobility  Outcome Measure Help needed turning from your back to your side while in a flat bed without using bedrails?: A Little Help needed moving from lying on  your back to sitting on the side of a flat bed without using bedrails?: A Little Help needed moving to and from a bed to a chair (including a wheelchair)?: A Little Help needed standing up from a chair using your arms (e.g., wheelchair or bedside chair)?: A Little Help needed to walk in hospital room?: A Little Help needed climbing 3-5 steps with a railing? : A Lot 6 Click Score: 17    End of Session Equipment Utilized During Treatment: Gait belt Activity Tolerance: Patient tolerated treatment well Patient left: in chair;with call bell/phone within reach;with chair alarm set;with family/visitor present Nurse Communication: Mobility status PT Visit Diagnosis: Unsteadiness on feet (R26.81);History of falling (Z91.81)    Time: 8119-1478 PT Time Calculation (min) (ACUTE ONLY): 33 min   Charges:   PT Evaluation $PT Eval Low Complexity: 1 Low PT Treatments $Therapeutic Activity: 8-22 mins PT General Charges $$ ACUTE PT VISIT: 1 Visit         Lillia Pauls, PT, DPT Acute Rehabilitation Services Office [phone]   Norval Morton 01/01/2024, 12:56 PM

## 2024-01-01 NOTE — Progress Notes (Addendum)
PROGRESS NOTE   DANICKA HOURIHAN  ZOX:[id number]    DOB: Sep 22, 1936    DOA: 12/31/2023  PCP: Corwin Levins, MD   I have briefly reviewed patients previous medical records in [facility].  Chief Complaint  Patient presents with   Altered Mental Status    Brief Hospital Course:  88 year old female, lives by herself and independent of all her ADLs except driving, medical history significant for mild dementia, hypothyroidism, HTN, HLD, anxiety and depression, complete AV block s/p PPM, UTIs and breast cancer presented to the ED due to altered mental status, generalized weakness, possible fall at home.  At baseline she reportedly is alert and oriented x 4.  Admitted for acute metabolic encephalopathy suspected due to acute cystitis without hematuria, dehydration, acute kidney injury and mild rhabdomyolysis.   Assessment & Plan:  Principal Problem:   Acute metabolic encephalopathy Active Problems:   Urinary tract infection without hematuria   Altered mental status   Acute metabolic encephalopathy Suspected due to acute cystitis, dehydration and AKI complicating underlying mild dementia. CT head without acute findings. Treat underlying causes as below and monitor. Delirium precautions.  Avoid opioids and sedatives.  Acute cystitis without hematuria Prior history of UTIs.  Unable to get adequate history due to mental status change. Did report intermittent rigors on admission.  Had mild leukocytosis.  UA was positive for nitrites, had many bacteria and significant pyuria. Continue empiric IV ceftriaxone pending urine culture results.  Acute kidney injury/mild hypercalcemia Suspected due to dehydration.  UA did not show any hemoglobinuria Creatinine 1.01 04/17/2023. Presented with creatinine of 1.91.  Improved with IV fluids to 1.66. Continue gentle IV fluids for additional 24 hours and monitor BMP Avoid nephrotoxics.  Mild rhabdomyolysis Unclear of the fall details and how  long on the floor she was CK 1132 Temporarily hold statins, continue IV fluids and follow CK in AM. Mild transaminitis and hyperbilirubinemia likely related to this and CMP to be followed tomorrow.  Fall at home CT head and x-ray of pelvis with left hip without acute findings PT and OT evaluation.  PT recommend SNF.  TOC consulted.  Essential hypertension Controlled on atenolol, continue  Complete AV block s/p PPM Noted.  Not on telemetry  Dyslipidemia Given mild rhabdomyolysis and LFT abnormalities, hold atorvastatin temporarily.  Has received this morning's dose.  Hypothyroidism Continue Synthroid.  Check TSH.  Dementia with?  Behavioral abnormalities Continue Aricept Not sure if the Seroquel dose that she is on at home i.e. 25 mg 3 times daily is for this or below.  EKG has chronically shows QTc greater than 500 ms since 2023, but has V-paced rhythm with BBB morphology so not sure if the QTc is accurate.  Anxiety and depression Continue home dose of Seroquel.  Vitamin D insufficiency Vitamin D 25-hydroxy: 19.30.  Started vitamin D 1000 units daily.  Recheck vitamin D levels in a couple of months for improvement and if not then consider higher doses.  Body mass index is 16.52 kg/m.    DVT prophylaxis: enoxaparin (LOVENOX) injection 30 mg Start: 12/31/23 2000     Code Status: Limited: Do not attempt resuscitation (DNR) -DNR-LIMITED -Do Not Intubate/DNI :  Family Communication: Discussed in detail with patient's daughter/HCPOA via phone, updated care and answered all questions.  Confirmed DNR/DNI status with her. Disposition:  Status is: Observation The patient will require care spanning > 2 midnights and should be moved to inpatient because: Ongoing mental status changes, IV antibiotics pending urine culture results,  IV fluids due to AKI, dehydration and rhabdomyolysis.   Time spent: 50 minutes coordinating care, updating family and care team.  Consultants:    None  Procedures:     Antimicrobials:      Subjective:  Patient seen this morning, awake and alert, oriented only to self and gave me her maiden name.  Told me that her husband had passed.  Subsequently discussed with patient's daughter a short while ago who informed me that her mother's mental status may be even worse than last night, lethargic and weak-advised her that this may be due to lack of sleep last night, maybe trying to catch up on sleep.  Objective:   Vitals:   01/01/24 0226 01/01/24 0514 01/01/24 0802 01/01/24 0913  BP:  119/74 123/72 99/62  Pulse:  77 72 64  Resp:  14  17  Temp:  97.7 F (36.5 C)  98.2 F (36.8 C)  TempSrc:  Oral  Oral  SpO2:  96%  97%  Weight: 42.3 kg     Height: 5\' 3"  (1.6 m)       General exam: Elderly female, moderately built and frail, lying comfortably propped up in bed without distress.  Oral mucosa with borderline hydration. Respiratory system: Clear to auscultation. Respiratory effort normal. Cardiovascular system: S1 & S2 heard, RRR. No JVD, murmurs, rubs, gallops or clicks. No pedal edema.  Not on telemetry. Gastrointestinal system: Abdomen is nondistended, soft and nontender. No organomegaly or masses felt. Normal bowel sounds heard. Central nervous system: Alert and oriented only to self. No focal neurological deficits. Extremities: Symmetric 5 x 5 power. Skin: No rashes, lesions or ulcers Psychiatry: Judgement and insight impaired. Mood & affect difficult to assess.     Data Reviewed:   I have personally reviewed following labs and imaging studies   CBC: Recent Labs  Lab 12/31/23 1446 01/01/24 0612  WBC 14.6* 11.2*  HGB 13.3 12.1  HCT 39.6 35.4*  MCV 95.2 94.4  PLT 167 147*    Basic Metabolic Panel: Recent Labs  Lab 12/31/23 1446 01/01/24 0612  NA 137 135  K 4.1 4.1  CL 98 100  CO2 21* 24  GLUCOSE 133* 125*  BUN 36* 38*  CREATININE 1.91* 1.66*  CALCIUM 10.6* 9.3    Liver Function Tests: Recent Labs   Lab 12/31/23 2102 01/01/24 0612  AST 42* 48*  ALT 40 41  ALKPHOS 51 58  BILITOT 2.5* 1.9*  PROT 6.3* 6.2*  ALBUMIN 3.4* 3.2*    CBG: No results for input(s): "GLUCAP" in the last 168 hours.  Microbiology Studies:  No results found for this or any previous visit (from the past 240 hours).  Radiology Studies:  DG HIP PORT UNILAT WITH PELVIS 1V LEFT Result Date: 12/31/2023 CLINICAL DATA:  Left hip pain EXAM: DG HIP (WITH OR WITHOUT PELVIS) 1V PORT LEFT COMPARISON:  12/25/2018 FINDINGS: Previous right hip replacement has a good appearance. There is osteoarthritis of the left hip with joint space narrowing and marginal osteophytes. This has worsened slightly since 2020 by radiography. No other regional finding of note. IMPRESSION: 1. Previous right hip replacement with a good appearance. 2. Osteoarthritis of the left hip with joint space narrowing and marginal osteophytes. This has worsened slightly since 2020 by radiography. Electronically Signed   By: Paulina Fusi M.D.   On: 12/31/2023 18:39   CT Head Wo Contrast Result Date: 12/31/2023 CLINICAL DATA:  Head trauma, minor (Age >= 65y). Fall with altered mental status. EXAM: CT HEAD  WITHOUT CONTRAST TECHNIQUE: Contiguous axial images were obtained from the base of the skull through the vertex without intravenous contrast. RADIATION DOSE REDUCTION: This exam was performed according to the departmental dose-optimization program which includes automated exposure control, adjustment of the mA and/or kV according to patient size and/or use of iterative reconstruction technique. COMPARISON:  Head CT 10/23/2021 FINDINGS: Brain: There is no evidence of an acute infarct, intracranial hemorrhage, mass, midline shift, or extra-axial fluid collection. Patchy hypodensities in the cerebral white matter are unchanged and nonspecific but compatible with moderate chronic small vessel ischemic disease. Mild cerebral atrophy is likely within normal limits for age.  Vascular: Calcified atherosclerosis at the skull base. No hyperdense vessel. Skull: No acute fracture or suspicious osseous lesion. Sinuses/Orbits: Small mucous retention cyst in the left maxillary sinus. Clear mastoid air cells. Unremarkable orbits. Other: None. IMPRESSION: 1. No evidence of acute intracranial abnormality. 2. Moderate chronic small vessel ischemic disease. Electronically Signed   By: Sebastian Ache M.D.   On: 12/31/2023 17:31    Scheduled Meds:    atenolol  50 mg Oral Daily   donepezil  10 mg Oral QHS   enoxaparin (LOVENOX) injection  30 mg Subcutaneous Q24H   levothyroxine  112 mcg Oral QAC breakfast   QUEtiapine  25 mg Oral TID    Continuous Infusions:    cefTRIAXone (ROCEPHIN)  IV     lactated ringers 75 mL/hr at 01/01/24 0804     LOS: 0 days     Marcellus Scott, MD,  FACP, [facility], [facility], [facility]   [facility] [address]      To contact the attending provider between 7A-7P or the covering provider during after hours 7P-7A, please log into the web site [email or web address] and access using universal  password for that web site. If you do not have the password, please call the hospital operator.  01/01/2024, 12:53 PM

## 2024-01-01 NOTE — Progress Notes (Signed)
Patient arrive to [facility] room 5 alert, oriented to self and person. Disoriented to place and time.Patient transferred from stretcher to bed and place in lowest position.Call light in reach. Daughter at bedside.will continue to monitor.

## 2024-01-01 NOTE — Progress Notes (Signed)
OT Cancellation Note  Patient Details Name: Danielle [name] MRN: [id number] DOB: October 28, 1936   Cancelled Treatment:    Reason Eval/Treat Not Completed: Patient declined, stating she did not know me. Even after offering suggestions ie: let's get up and go look out the window, can I help you to the bathroom, would you like to get washed up--all was no. Due to her history of mild dementia and now with the UTI making her more confused I did not push her. Called Dtr Danielle [name]) and left message to see if she would be up here today and let the RN know if so, so I could possibly come back to see Danielle [name] when her dtr is here.  Lindon Romp OT Acute Rehabilitation Services Office [phone]    Evette Georges 01/01/2024, 9:12 AM

## 2024-01-01 NOTE — Social Work (Signed)
CSW acknowledges consult for SNF and PT/OT recs. Pt disoriented with cognitive impairments due to dementia, VM left for dtr. FL2 started. TOC will continue to follow.

## 2024-01-01 NOTE — Evaluation (Signed)
Occupational Therapy Evaluation Patient Details Name: Danielle [name] MRN: [id number] DOB: 17-Dec-1935 Today's Date: 01/01/2024   History of Present Illness   History of Present Illness: Danielle [name] is a 88 y.o. female presenting to the ED for evaluation of altered mental status and fall. Found to have UTI.  PHMx: mild dementia, hypothyroidism, HTN, HLD, anxiety and depression, complete AV block s/p PPM, UTIs, and breast cancer     Clinical Impressions This 88 yo female admitted with above presents to acute OT with PLOF of being independent (no AD used) with basic ADLs and IADLs including gardening as well as being able to read, write, and carry on a conversation. Currently is she is at a setup-min A for basic ADLs, Min A for ambulation without an AD, and will answer questions but little initiation of conversation. She will continue to benefit from acute OT with follow up from continued inpatient follow up therapy, <3 hours/day      If plan is discharge home, recommend the following:   A little help with walking and/or transfers;A little help with bathing/dressing/bathroom;Assistance with cooking/housework;Assist for transportation;Direct supervision/assist for financial management;Direct supervision/assist for medications management;Help with stairs or ramp for entrance     Functional Status Assessment   Patient has had a recent decline in their functional status and demonstrates the ability to make significant improvements in function in a reasonable and predictable amount of time.     Equipment Recommendations   Other (comment) (TBD next venue)      Precautions/Restrictions   Precautions Precautions: Fall Restrictions Weight Bearing Restrictions Per Provider Order: No     Mobility Bed Mobility               General bed mobility comments: pt up in recliner upon arrival    Transfers Overall transfer level: Needs assistance Equipment used: 1 person  hand held assist   Sit to Stand: Min assist           General transfer comment: pt able to stand but not let go of arms of recliner until I offerred her a hand.      Balance Overall balance assessment: Needs assistance Sitting-balance support: No upper extremity supported, Feet supported Sitting balance-Leahy Scale: Fair     Standing balance support: Single extremity supported, During functional activity Standing balance-Leahy Scale: Poor Standing balance comment: standing at sink to brush teeth                           ADL either performed or assessed with clinical judgement   ADL Overall ADL's : Needs assistance/impaired Eating/Feeding: Set up;Sitting   Grooming: Contact guard assist;Standing   Upper Body Bathing: Set up;Supervision/ safety;Sitting   Lower Body Bathing: Minimal assistance;Sit to/from stand   Upper Body Dressing : Set up;Supervision/safety;Sitting   Lower Body Dressing: Minimal assistance;Sit to/from stand   Toilet Transfer: Minimal assistance;Ambulation Toilet Transfer Details (indicate cue type and reason): one person HHA, simulated recliner>sink in bathroom to stand and brush teeth>back out to recliner Toileting- Clothing Manipulation and Hygiene: Minimal assistance;Sit to/from stand               Vision Patient Visual Report: No change from baseline              Pertinent Vitals/Pain Pain Assessment Pain Assessment: No/denies pain     Extremity/Trunk Assessment Upper Extremity Assessment Upper Extremity Assessment: Overall WFL for tasks assessed      Communication Communication  Communication: No apparent difficulties   Cognition Arousal: Alert Behavior During Therapy: WFL for tasks assessed/performed                                 Following commands: Impaired Following commands impaired: Follows one step commands inconsistently, Follows one step commands with increased time      Cueing  General Comments   Cueing Techniques: Tactile cues;Verbal cues              Home Living Family/patient expects to be discharged to:: Private residence Living Arrangements: Alone Available Help at Discharge: Family;Available PRN/intermittently Type of Home: House Home Access: Stairs to enter [employer] of Steps: 4 Entrance Stairs-Rails: Right;Left Home Layout: One level     Bathroom Shower/Tub: Chief Strategy Officer: Standard     Home Equipment: Shower seat;Cane - single point   Additional Comments: Pt family checks in or calls several times a week. Just approved for Meals on Wheels program. PTA pt able to read, write, carry on conversation, garden.      Prior Functioning/Environment Prior Level of Function : Independent/Modified Independent             Mobility Comments: independent ADLs Comments: independent ADL's/most IADL's. pt daughter manages finances, pt manages own medications.    OT Problem List: Decreased activity tolerance;Impaired balance (sitting and/or standing);Decreased cognition;Decreased safety awareness   OT Treatment/Interventions: Self-care/ADL training;DME and/or AE instruction;Balance training;Patient/family education      OT Goals(Current goals can be found in the care plan section)   Acute Rehab OT Goals Patient Stated Goal: Did not walk any further than to bathroom Time For Goal Achievement: 01/15/24 Potential to Achieve Goals: Good   OT Frequency:  Min 1X/week       AM-PAC OT "6 Clicks" Daily Activity     Outcome Measure Help from another person eating meals?: A Little Help from another person taking care of personal grooming?: A Little Help from another person toileting, which includes using toliet, bedpan, or urinal?: A Little Help from another person bathing (including washing, rinsing, drying)?: A Little Help from another person to put on and taking off regular upper body clothing?: A  Little Help from another person to put on and taking off regular lower body clothing?: A Little 6 Click Score: 18   End of Session Equipment Utilized During Treatment: Gait belt  Activity Tolerance: Patient tolerated treatment well Patient left: in chair;with call bell/phone within reach;with chair alarm set;with family/visitor present  OT Visit Diagnosis: Unsteadiness on feet (R26.81);Other abnormalities of gait and mobility (R26.89);History of falling (Z91.81);Other symptoms and signs involving cognitive function                Time: 1112-1129 OT Time Calculation (min): 17 min Charges:  OT General Charges $OT Visit: 1 Visit OT Evaluation $OT Eval Moderate Complexity: 1 Mod  Cathy L. OT Acute Rehabilitation Services Office [phone]    Evette Georges 01/01/2024, 1:08 PM

## 2024-01-02 ENCOUNTER — Other Ambulatory Visit: Payer: Medicare HMO

## 2024-01-02 DIAGNOSIS — G9341 Metabolic encephalopathy: Secondary | ICD-10-CM | POA: Diagnosis not present

## 2024-01-02 LAB — BASIC METABOLIC PANEL
Anion gap: 10 (ref 5–15)
BUN: 37 mg/dL — ABNORMAL HIGH (ref 8–23)
CO2: 24 mmol/L (ref 22–32)
Calcium: 8.7 mg/dL — ABNORMAL LOW (ref 8.9–10.3)
Chloride: 102 mmol/L (ref 98–111)
Creatinine, Ser: 1.65 mg/dL — ABNORMAL HIGH (ref 0.44–1.00)
GFR, Estimated: 30 mL/min — ABNORMAL LOW (ref >60)
Glucose, Bld: 99 mg/dL (ref 70–99)
Potassium: 3.8 mmol/L (ref 3.5–5.1)
Sodium: 136 mmol/L (ref 135–145)

## 2024-01-02 LAB — CBC
HCT: 34.9 % — ABNORMAL LOW (ref 36.0–46.0)
Hemoglobin: 11.7 g/dL — ABNORMAL LOW (ref 12.0–15.0)
MCH: 32.2 pg (ref 26.0–34.0)
MCHC: 33.5 g/dL (ref 30.0–36.0)
MCV: 96.1 fL (ref 80.0–100.0)
Platelets: 132 10*3/uL — ABNORMAL LOW (ref 150–400)
RBC: 3.63 MIL/uL — ABNORMAL LOW (ref 3.87–5.11)
RDW: 14.5 % (ref 11.5–15.5)
WBC: 9.5 10*3/uL (ref 4.0–10.5)
nRBC: 0 % (ref 0.0–0.2)

## 2024-01-02 LAB — URINE CULTURE: Culture: >=100000 — AB

## 2024-01-02 LAB — HEPATIC FUNCTION PANEL
ALT: 41 U/L (ref 0–44)
AST: 54 U/L — ABNORMAL HIGH (ref 15–41)
Albumin: 2.7 g/dL — ABNORMAL LOW (ref 3.5–5.0)
Alkaline Phosphatase: 54 U/L (ref 38–126)
Bilirubin, Direct: 0.2 mg/dL (ref 0.0–0.2)
Indirect Bilirubin: 1.2 mg/dL — ABNORMAL HIGH (ref 0.3–0.9)
Total Bilirubin: 1.4 mg/dL — ABNORMAL HIGH (ref 0.0–1.2)
Total Protein: 6.1 g/dL — ABNORMAL LOW (ref 6.5–8.1)

## 2024-01-02 LAB — CK: Total CK: 700 U/L — ABNORMAL HIGH (ref 38–234)

## 2024-01-02 MED ORDER — LACTATED RINGERS IV SOLN: INTRAVENOUS | Status: DC

## 2024-01-02 MED ORDER — CEPHALEXIN 500 MG PO CAPS
500.0000 mg | ORAL_CAPSULE | Freq: Two times a day (BID) | ORAL | Status: DC
  Administered 2024-01-03 – 2024-01-06 (×7): 500 mg via ORAL
  Filled 2024-01-02 (×7): qty 1

## 2024-01-02 MED ORDER — ATENOLOL 25 MG PO TABS
25.0000 mg | ORAL_TABLET | Freq: Every day | ORAL | Status: DC
  Administered 2024-01-02 – 2024-01-06 (×5): 25 mg via ORAL
  Filled 2024-01-02 (×5): qty 1

## 2024-01-02 NOTE — Progress Notes (Signed)
Mobility Specialist Progress Note:   01/02/24 1557  Mobility  Activity Ambulated with assistance to bathroom  Level of Assistance Minimal assist, patient does 75% or more  Assistive Device  (HHA/ IV Pole)  Distance Ambulated (ft) 15 ft  Activity Response Tolerated well  Mobility Referral Yes  Mobility visit 1 Mobility  Mobility Specialist Start Time (ACUTE ONLY) 1520  Mobility Specialist Stop Time (ACUTE ONLY) 1530  Mobility Specialist Time Calculation (min) (ACUTE ONLY) 10 min   Pt received in chair, requesting to use BR. MinA to stand. HHA to ambulate to BR. Pt alert and oriented only needing MinA verbal cues for direction and hand placement during ambulation. Void successful. Pt able to perform pericare independently. Pt left in bed with call bell in reach and all needs met. Bed alarm on.   Leory Plowman  Mobility Specialist Please contact via SecureChat Rehab office at [phone]

## 2024-01-02 NOTE — Progress Notes (Signed)
Danielle [name]  ZOX:[id number] DOB: [birth year] DOA: 12/31/2023 PCP: Corwin Levins, MD    Brief Narrative:  88 year old who lives independently with a history of mild dementia, hypothyroidism, HTN, HLD, anxiety/depression, AV block status post PPM, frequent UTIs, and breast cancer who presented to the ER with altered mental status generalized weakness and a possible fall at home.  At baseline she is reportedly alert and oriented x 4.  She was diagnosed with acute cystitis without hematuria as well as dehydration with AKI and mild rhabdomyolysis.  She  Goals of Care:   Code Status: Limited: Do not attempt resuscitation (DNR) -DNR-LIMITED -Do Not Intubate/DNI    DVT prophylaxis: enoxaparin (LOVENOX) injection 30 mg Start: 12/31/23 2000   Interim Hx: Afebrile.  Vital signs stable.  Renal function slowly improving.  Is sitting up in a bedside chair and interactive.  Family reports she is not back to baseline but is improving.  Does appear to be mildly confused but for the most part can answer my questions and provide a reliable ROS.  No chest pain or shortness of breath.  No nausea or vomiting.  Assessment & Plan:  Acute metabolic encephalopathy multifactorial due to acute cystitis dehydration and AKI complicating underlying mild dementia -CT head without acute findings -monitor mental status with treatment of inciting etiologies  E. coli acute cystitis without hematuria Has a history of recurrent UTIs -proven to be pansensitive on culture -transition to oral antibiotics in a.m. if oral intake consistent  Acute kidney injury Likely simply due to dehydration/prerenal state -baseline creatinine 1.01 with creatinine 1.91 at presentation -improving with gentle volume resuscitation  Recent Labs  Lab 12/31/23 1446 01/01/24 0612 01/02/24 0625  CREATININE 1.91* 1.66* 1.65*     Mild rhabdomyolysis Scant details on fall or period of immobility as pt lives alone - CK 1132 at presentation -  continue volume resuscitation - CK improving steadily   Fall at home CT head and x-rays of the pelvis without acute findings - SNF rehab stay recommended by therapy  Hypothyroidism TSH markedly elevated at 143 - is prescribed Synthroid 112 mcg daily - unclear if she has been compliant - resume for now and monitor - check FT4  HTN Continue usual atenolol  Complete heart block status post pacemaker  Hyperlipidemia Holding statin due to rhabdo  Mild baseline dementia Continue usual Aricept  Anxiety/depression Continue usual home dose of Seroquel  Vitamin D deficiency 25-hydroxy vitamin D noted to be low at 19.3 -continue supplementation -will need recheck of vitamin D level in 8 weeks   Family Communication: Spoke with daughter at bedside at length Disposition: Therapy is recommending SNF rehab stay -anticipate she will be medically ready as soon as 2/13   Objective: Blood pressure 114/74, pulse 60, temperature 98.1 F (36.7 C), temperature source Oral, resp. rate 16, height 5\' 3"  (1.6 m), weight 42.3 kg, SpO2 98%.  Intake/Output Summary (Last 24 hours) at 01/02/2024 0914 Last data filed at 01/02/2024 8119 Gross per 24 hour  Intake 695.18 ml  Output 500 ml  Net 195.18 ml   Filed Weights   01/01/24 0226  Weight: 42.3 kg    Examination: General: No acute respiratory distress Lungs: Clear to auscultation bilaterally without wheezes or crackles Cardiovascular: Regular rate and rhythm without murmur gallop or rub normal S1 and S2 Abdomen: Nontender, nondistended, soft, bowel sounds positive, no rebound, no ascites, no appreciable mass Extremities: No significant cyanosis, clubbing, or edema bilateral lower extremities  CBC: Recent Labs  Lab  12/31/23 1446 01/01/24 0612 01/02/24 0625  WBC 14.6* 11.2* 9.5  HGB 13.3 12.1 11.7*  HCT 39.6 35.4* 34.9*  MCV 95.2 94.4 96.1  PLT 167 147* 132*   Basic Metabolic Panel: Recent Labs  Lab 12/31/23 1446 01/01/24 0612  01/02/24 0625  NA 137 135 136  K 4.1 4.1 3.8  CL 98 100 102  CO2 21* 24 24  GLUCOSE 133* 125* 99  BUN 36* 38* 37*  CREATININE 1.91* 1.66* 1.65*  CALCIUM 10.6* 9.3 8.7*   GFR: Estimated Creatinine Clearance: 16.3 mL/min (A) (by C-G formula based on SCr of 1.65 mg/dL (H)).   Scheduled Meds:  atenolol  50 mg Oral Daily   cholecalciferol  1,000 Units Oral Daily   donepezil  10 mg Oral QHS   enoxaparin (LOVENOX) injection  30 mg Subcutaneous Q24H   levothyroxine  112 mcg Oral QAC breakfast   QUEtiapine  25 mg Oral TID   Continuous Infusions:  cefTRIAXone (ROCEPHIN)  IV 1 g (01/01/24 2041)     LOS: 1 day   Lonia Blood, MD [facility] Office  [phone] Pager - Text Page per Loretha Stapler  If 7PM-7AM, please contact night-coverage per Amion 01/02/2024, 9:14 AM

## 2024-01-02 NOTE — Progress Notes (Signed)
Physical Therapy Treatment Patient Details Name: Danielle [name] MRN: [id number] DOB: 12-Apr-1936 Today's Date: 01/02/2024   History of Present Illness Danielle [name] is a 88 y.o. female presenting to the ED for evaluation of altered mental status and fall. Found to have UTI.  PHMx: mild dementia, hypothyroidism, HTN, HLD, anxiety and depression, complete AV block s/p PPM, UTIs, and breast cancer    PT Comments  Patient agreeable to participate with therapy today. Patient demonstrates some inconsistencies with date and time, short term memory affected. Patient educated on tracking the calendar in her room to recall date and time. Under supervision, the patient was able to transition from supine > sit on her own. Demonstrates good trunk mobility and moves bilateral LEs to EOB. Patient requires Min A +1 to assist with push off from a seated position due to the bed being lower to the ground, however demonstrates good UE and LE support for push. Ambulated with the IV pole and HHA +1 for 166ft within the unit. Demonstrates a step through pattern with cues to improve foot clearance. Practiced toileting and washing hands at the sink prior to ambulation within the unit. Patient will benefit from continued inpatient follow up therapy, <3 hours/day due to cognitive impairment, decreased caregiver support, and high fall risk status.     If plan is discharge home, recommend the following: A little help with walking and/or transfers;A little help with bathing/dressing/bathroom;Assistance with cooking/housework;Direct supervision/assist for medications management;Direct supervision/assist for financial management;Assist for transportation;Help with stairs or ramp for entrance   Can travel by private vehicle     Yes  Equipment Recommendations  None recommended by PT    Recommendations for Other Services       Precautions / Restrictions Precautions Precautions: Fall Restrictions Weight Bearing  Restrictions Per Provider Order: No     Mobility  Bed Mobility Overal bed mobility: Needs Assistance Bed Mobility: Supine to Sit     Supine to sit: Supervision     General bed mobility comments: Patient demonstrates good trunk control to transition to EOB, able to move legs without assistance to EOB    Transfers Overall transfer level: Needs assistance Equipment used: None Transfers: Sit to/from Stand Sit to Stand: Min assist           General transfer comment: Patient able to use bilateral UE support to push off from bed, Min A +1 to assist with push from seated position    Ambulation/Gait Ambulation/Gait assistance: Contact guard assist (+1 HHA) Gait Distance (Feet): 120 Feet Assistive device: IV Pole, 1 person hand held assist Gait Pattern/deviations: Step-through pattern, Decreased stride length, Shuffle Gait velocity: decreased     General Gait Details: Shuffling gait pattern, consistent hands on assist for balance and guidance   Stairs             Wheelchair Mobility     Tilt Bed    Modified Rankin (Stroke Patients Only)       Balance Overall balance assessment: Needs assistance Sitting-balance support: No upper extremity supported, Feet supported Sitting balance-Leahy Scale: Fair     Standing balance support: Bilateral upper extremity supported, Reliant on assistive device for balance Standing balance-Leahy Scale: Poor Standing balance comment: Stood at sink to wash hands, has to have external support to remain steady                            Communication Communication Communication: No apparent difficulties  Cognition  Arousal: Alert Behavior During Therapy: WFL for tasks assessed/performed   PT - Cognitive impairments: History of cognitive impairments                       PT - Cognition Comments: Pt with hx of dementia, but demonstrates acute confusion, A&Ox2, decreased short term memory Following commands:  Intact Following commands impaired: Follows one step commands inconsistently, Follows one step commands with increased time    Cueing Cueing Techniques: Tactile cues, Verbal cues  Exercises      General Comments        Pertinent Vitals/Pain Pain Assessment Pain Assessment: Faces Faces Pain Scale: Hurts a little bit Pain Location: L leg/knee Pain Descriptors / Indicators: Sore Pain Intervention(s): Monitored during session    Home Living                          Prior Function            PT Goals (current goals can now be found in the care plan section) Acute Rehab PT Goals Patient Stated Goal: pt daughter would like pt to return to baseline PT Goal Formulation: With patient/family Time For Goal Achievement: 01/15/24 Potential to Achieve Goals: Fair Progress towards PT goals: Progressing toward goals    Frequency    Min 1X/week      PT Plan      Co-evaluation              AM-PAC PT "6 Clicks" Mobility   Outcome Measure  Help needed turning from your back to your side while in a flat bed without using bedrails?: A Little Help needed moving from lying on your back to sitting on the side of a flat bed without using bedrails?: A Little Help needed moving to and from a bed to a chair (including a wheelchair)?: A Little Help needed standing up from a chair using your arms (e.g., wheelchair or bedside chair)?: A Little Help needed to walk in hospital room?: A Little Help needed climbing 3-5 steps with a railing? : A Lot 6 Click Score: 17    End of Session Equipment Utilized During Treatment: Gait belt Activity Tolerance: Patient tolerated treatment well Patient left: in chair;with call bell/phone within reach;with chair alarm set   PT Visit Diagnosis: Unsteadiness on feet (R26.81);History of falling (Z91.81)     Time: 9147-8295 PT Time Calculation (min) (ACUTE ONLY): 34 min  Charges:    $Therapeutic Activity: 23-37 mins PT General  Charges $$ ACUTE PT VISIT: 1 Visit                    Doreen Beam, SPT   Ashlie Mcmenamy 01/02/2024, 12:09 PM

## 2024-01-02 NOTE — Plan of Care (Signed)
  Problem: Nutrition: Goal: Adequate nutrition will be maintained Outcome: Progressing   Problem: Elimination: Goal: Will not experience complications related to urinary retention Outcome: Progressing   Problem: Pain Managment: Goal: General experience of comfort will improve and/or be controlled Outcome: Progressing   Problem: Safety: Goal: Ability to remain free from injury will improve Outcome: Progressing

## 2024-01-02 NOTE — TOC Initial Note (Signed)
Transition of Care [facility]) - Initial/Assessment Note    Patient Details  Name: Danielle [name] MRN: [id number] Date of Birth: 1936/01/25  [facility]) CM/SW Contact:    Carley Hammed, LCSW Phone Number: 01/02/2024, 1:09 PM  Clinical Narrative:                 CSW met with pt and dtr at bedside to discuss disposition. Recommendation for SNF was explained, pt and dtr agreeable. Dtr would like for pt to be in [place], several facilities discussed, referrals faxed out. CSW to provide offers at bedside for pt tomorrow and email dtr at [email or web address]. TOC will continue to follow.   Expected Discharge Plan: Skilled Nursing Facility Barriers to Discharge: Continued Medical Work up, English as a second language teacher, SNF Pending bed offer   Patient Goals and CMS Choice Patient states their goals for this hospitalization and ongoing recovery are:: Pt wants to attend rehab to get her leg stronger. CMS Medicare.gov Compare Post Acute Care list provided to:: Patient Represenative (must comment) Choice offered to / list presented to : Patient, Adult Children      Expected Discharge Plan and Services In-house Referral: Clinical Social Work   Post Acute Care Choice: Skilled Nursing Facility Living arrangements for the past 2 months: Single Family Home                                      Prior Living Arrangements/Services Living arrangements for the past 2 months: Single Family Home Lives with:: Adult Children Patient language and need for interpreter reviewed:: Yes Do you feel safe going back to the place where you live?: Yes      Need for Family Participation in Patient Care: Yes (Comment) Care giver support system in place?: Yes (comment)   Criminal Activity/Legal Involvement Pertinent to Current Situation/Hospitalization: No - Comment as needed  Activities of Daily Living   ADL Screening (condition at time of admission) Independently performs ADLs?: Yes  (appropriate for developmental age) Is the patient deaf or have difficulty hearing?: Yes Does the patient have difficulty seeing, even when wearing glasses/contacts?: No Does the patient have difficulty concentrating, remembering, or making decisions?: Yes  Permission Sought/Granted Permission sought to share information with : Family Supports Permission granted to share information with : Yes, Verbal Permission Granted  Share Information with NAME: Magda Paganini     Permission granted to share info w Relationship: Daughter     Emotional Assessment Appearance:: Appears stated age Attitude/Demeanor/Rapport: Engaged Affect (typically observed): Appropriate Orientation: : Oriented to Self, Oriented to Place Alcohol / Substance Use: Not Applicable Psych Involvement: No (comment)  Admission diagnosis:  Urinary tract infection without hematuria, site unspecified [N39.0] Altered mental status, unspecified altered mental status type [R41.82] Acute metabolic encephalopathy [G93.41] Patient Active Problem List   Diagnosis Date Noted   Urinary tract infection without hematuria 01/01/2024   Altered mental status 01/01/2024   Acute metabolic encephalopathy 12/31/2023   Nodule of chest wall 10/30/2023   Encounter for well adult exam with abnormal findings 04/17/2023   Left leg pain 04/17/2023   Bilateral hearing loss 04/17/2023   Mild dementia 06/02/2022   Vitamin D deficiency 03/05/2022   Accidental overdose 12/16/2021   Hypoproteinemia 12/16/2021   Hyperbilirubinemia 12/16/2021   Gait disorder 08/27/2021   Low back pain 04/21/2021   Trigger finger of left hand 07/14/2020   Trigger finger of right hand 07/14/2020   Arthritis  06/11/2020   Renal calculus, right 02/09/2020   Neck pain 11/04/2019   Status post total hip replacement, right 11/04/2019   Urinary incontinence 10/09/2019   History of total hip arthroplasty 04/28/2019   Degenerative joint disease of right hip 12/17/2018    Peripheral venous insufficiency 03/15/2017   Subungual hematoma of foot 03/15/2017   Blood in urine 02/22/2017   History of right mastectomy 01/11/2017   Malignant neoplasm of upper-inner quadrant of female breast 10/06/2016   Right hip pain 07/18/2016   Olecranon bursitis of left elbow 05/05/2015   Alopecia 01/21/2015   Left leg swelling 01/21/2015   Cervical radiculopathy 07/11/2013   RUQ pain 09/20/2012   Dizziness 08/03/2012   Pacemaker-medtronic 01/23/2012   Cardiac pacemaker in situ 01/23/2012   Impaired glucose tolerance 07/28/2011   Osteoarthritis cervical spine 02/25/2010   Spondylosis 02/25/2010   Complete atrioventricular block (HCC) 12/16/2009   Insomnia 06/15/2009   Hypothyroidism 01/09/2008   Hyperlipidemia 01/09/2008   Normocytic anemia 01/09/2008   Generalized anxiety disorder 01/09/2008   Major depressive disorder 01/09/2008   Essential hypertension 01/09/2008   Allergic rhinitis 01/09/2008   Gastroesophageal reflux disease 01/09/2008   Personal history of venous thrombosis and embolism 01/09/2008   History of thromboembolism of vein 01/09/2008   PCP:  Corwin Levins, MD Pharmacy:   [id number] [address]), [place] - [address] [place] ([place]) [place] [address] Phone: [phone] Fax: [phone]  [facility] - [place], [place] - [address] [place] [address] Phone: [phone] Fax: [phone]  [id number] [place], [place] - [address] AT [facility] [address] [place] [address] Phone: [phone] Fax: [phone]  [email or web address] - [facility] Delivery - [place], [place] - [address] [place] [address] Phone: [phone] Fax: [phone]  [id number] (INTERNAL USE ONLY) - [place], [place] - [address] [place]  [address] Phone: [phone] Fax: [phone]     Social Drivers of Health (SDOH) Social History: SDOH Screenings   Food Insecurity: Patient Unable To Answer (12/31/2023)  Housing: Unknown (12/31/2023)  Transportation Needs: No Transportation Needs (12/31/2023)  Utilities: Not At Risk (12/31/2023)  Alcohol Screen: Low Risk  (04/15/2023)  Depression (PHQ2-9): Low Risk  (10/30/2023)  Financial Resource Strain: Low Risk  (04/15/2023)  Physical Activity: Sufficiently Active (04/15/2023)  Social Connections: Moderately Isolated (12/31/2023)  Stress: Stress Concern Present (04/15/2023)  Tobacco Use: Medium Risk (12/31/2023)   SDOH Interventions:     Readmission Risk Interventions     No data to display

## 2024-01-02 NOTE — [place] FL2 (Signed)
[place] MEDICAID FL2 LEVEL OF CARE FORM     IDENTIFICATION  Patient Name: Danielle [name] Birthdate: Jun 21, 1936 Sex: female Admission Date (Current Location): 12/31/2023  [facility] and [place] Number:  Producer, television/film/video and Address:  [facility], [address] [place], [place] [address]      Provider Number: [id number]  Attending Physician Name and Address:  Lonia Blood, MD  Relative Name and Phone Number:       Current Level of Care: Hospital Recommended Level of Care: Skilled Nursing Facility Prior Approval Number:    Date Approved/Denied:   PASRR Number: [id number]  Discharge Plan: SNF    Current Diagnoses: Patient Active Problem List   Diagnosis Date Noted   Urinary tract infection without hematuria 01/01/2024   Altered mental status 01/01/2024   Acute metabolic encephalopathy 12/31/2023   Nodule of chest wall 10/30/2023   Encounter for well adult exam with abnormal findings 04/17/2023   Left leg pain 04/17/2023   Bilateral hearing loss 04/17/2023   Mild dementia 06/02/2022   Vitamin D deficiency 03/05/2022   Accidental overdose 12/16/2021   Hypoproteinemia 12/16/2021   Hyperbilirubinemia 12/16/2021   Gait disorder 08/27/2021   Low back pain 04/21/2021   Trigger finger of left hand 07/14/2020   Trigger finger of right hand 07/14/2020   Arthritis 06/11/2020   Renal calculus, right 02/09/2020   Neck pain 11/04/2019   Status post total hip replacement, right 11/04/2019   Urinary incontinence 10/09/2019   History of total hip arthroplasty 04/28/2019   Degenerative joint disease of right hip 12/17/2018   Peripheral venous insufficiency 03/15/2017   Subungual hematoma of foot 03/15/2017   Blood in urine 02/22/2017   History of right mastectomy 01/11/2017   Malignant neoplasm of upper-inner quadrant of female breast 10/06/2016   Right hip pain 07/18/2016   Olecranon bursitis of left elbow 05/05/2015   Alopecia  01/21/2015   Left leg swelling 01/21/2015   Cervical radiculopathy 07/11/2013   RUQ pain 09/20/2012   Dizziness 08/03/2012   Pacemaker-medtronic 01/23/2012   Cardiac pacemaker in situ 01/23/2012   Impaired glucose tolerance 07/28/2011   Osteoarthritis cervical spine 02/25/2010   Spondylosis 02/25/2010   Complete atrioventricular block (HCC) 12/16/2009   Insomnia 06/15/2009   Hypothyroidism 01/09/2008   Hyperlipidemia 01/09/2008   Normocytic anemia 01/09/2008   Generalized anxiety disorder 01/09/2008   Major depressive disorder 01/09/2008   Essential hypertension 01/09/2008   Allergic rhinitis 01/09/2008   Gastroesophageal reflux disease 01/09/2008   Personal history of venous thrombosis and embolism 01/09/2008   History of thromboembolism of vein 01/09/2008    Orientation RESPIRATION BLADDER Height & Weight     Self  Normal Continent Weight: 93 lb 4.1 oz (42.3 kg) Height:  5\' 3"  (160 cm)  BEHAVIORAL SYMPTOMS/MOOD NEUROLOGICAL BOWEL NUTRITION STATUS      Continent Diet (See DC summary)  AMBULATORY STATUS COMMUNICATION OF NEEDS Skin   Limited Assist Verbally Normal                       Personal Care Assistance Level of Assistance  Bathing, Feeding, Dressing Bathing Assistance: Limited assistance Feeding assistance: Limited assistance Dressing Assistance: Limited assistance     Functional Limitations Info  Sight, Hearing, Speech Sight Info: Adequate Hearing Info: Adequate Speech Info: Adequate    SPECIAL CARE FACTORS FREQUENCY  PT (By licensed PT), OT (By licensed OT)     PT Frequency: 5x week OT Frequency: 5x week  Contractures Contractures Info: Not present    Additional Factors Info  Code Status, Allergies, Psychotropic Code Status Info: DNR Allergies Info: Codeine  Oxycodone Psychotropic Info: Donepezil, Quetiapine         Current Medications (01/02/2024):  This is the current hospital active medication list Current  Facility-Administered Medications  Medication Dose Route Frequency Provider Last Rate Last Admin   acetaminophen (TYLENOL) tablet 650 mg  650 mg Oral Q6H PRN Steffanie Rainwater, MD   650 mg at 01/01/24 0802   atenolol (TENORMIN) tablet 25 mg  25 mg Oral Daily Jetty Duhamel T, MD   25 mg at 01/02/24 1002   cefTRIAXone (ROCEPHIN) 1 g in sodium chloride 0.9 % 100 mL IVPB  1 g Intravenous Q24H Steffanie Rainwater, MD 200 mL/hr at 01/01/24 2041 1 g at 01/01/24 2041   cholecalciferol (VITAMIN D3) 25 MCG (1000 UNIT) tablet 1,000 Units  1,000 Units Oral Daily Marcellus Scott D, MD   1,000 Units at 01/02/24 1002   donepezil (ARICEPT) tablet 10 mg  10 mg Oral QHS Steffanie Rainwater, MD   10 mg at 01/01/24 2041   enoxaparin (LOVENOX) injection 30 mg  30 mg Subcutaneous Q24H Steffanie Rainwater, MD   30 mg at 01/01/24 2030   lactated ringers infusion   Intravenous Continuous Lonia Blood, MD 50 mL/hr at 01/02/24 1004 Rate Change at 01/02/24 1004   levothyroxine (SYNTHROID) tablet 112 mcg  112 mcg Oral QAC breakfast Steffanie Rainwater, MD   112 mcg at 01/02/24 0807   ondansetron (ZOFRAN) tablet 4 mg  4 mg Oral Q6H PRN Steffanie Rainwater, MD       Or   ondansetron [facility]) injection 4 mg  4 mg Intravenous Q6H PRN Steffanie Rainwater, MD       QUEtiapine (SEROQUEL) tablet 25 mg  25 mg Oral TID Steffanie Rainwater, MD   25 mg at 01/02/24 1002     Discharge Medications: Please see discharge summary for a list of discharge medications.  Relevant Imaging Results:  Relevant Lab Results:   Additional Information SS# [id number], [place]

## 2024-01-03 DIAGNOSIS — G9341 Metabolic encephalopathy: Secondary | ICD-10-CM | POA: Diagnosis not present

## 2024-01-03 LAB — VITAMIN B12: Vitamin B-12: 1931 pg/mL — ABNORMAL HIGH (ref 180–914)

## 2024-01-03 LAB — CBC
HCT: 32.2 % — ABNORMAL LOW (ref 36.0–46.0)
Hemoglobin: 11 g/dL — ABNORMAL LOW (ref 12.0–15.0)
MCH: 32 pg (ref 26.0–34.0)
MCHC: 34.2 g/dL (ref 30.0–36.0)
MCV: 93.6 fL (ref 80.0–100.0)
Platelets: 117 10*3/uL — ABNORMAL LOW (ref 150–400)
RBC: 3.44 MIL/uL — ABNORMAL LOW (ref 3.87–5.11)
RDW: 14.2 % (ref 11.5–15.5)
WBC: 9.2 10*3/uL (ref 4.0–10.5)
nRBC: 0 % (ref 0.0–0.2)

## 2024-01-03 LAB — BASIC METABOLIC PANEL
Anion gap: 10 (ref 5–15)
BUN: 28 mg/dL — ABNORMAL HIGH (ref 8–23)
CO2: 22 mmol/L (ref 22–32)
Calcium: 8.5 mg/dL — ABNORMAL LOW (ref 8.9–10.3)
Chloride: 102 mmol/L (ref 98–111)
Creatinine, Ser: 1.29 mg/dL — ABNORMAL HIGH (ref 0.44–1.00)
GFR, Estimated: 40 mL/min — ABNORMAL LOW (ref >60)
Glucose, Bld: 102 mg/dL — ABNORMAL HIGH (ref 70–99)
Potassium: 3.5 mmol/L (ref 3.5–5.1)
Sodium: 134 mmol/L — ABNORMAL LOW (ref 135–145)

## 2024-01-03 LAB — FOLATE: Folate: 10.7 ng/mL (ref >5.9)

## 2024-01-03 LAB — T4, FREE: Free T4: 0.28 ng/dL — ABNORMAL LOW (ref 0.61–1.12)

## 2024-01-03 NOTE — Progress Notes (Signed)
Danielle [name]  UJW:[id number] DOB: 11/10/1936 DOA: 12/31/2023 PCP: Corwin Levins, MD    Brief Narrative:  88 year old who lives independently with a history of mild dementia, hypothyroidism, HTN, HLD, anxiety/depression, AV block status post PPM, frequent UTIs, and breast cancer who presented to the ER with altered mental status generalized weakness and a possible fall at home.  At baseline she is reportedly alert and oriented x 4.  She was diagnosed with acute cystitis without hematuria as well as dehydration with AKI and mild rhabdomyolysis.   Goals of Care:   Code Status: Limited: Do not attempt resuscitation (DNR) -DNR-LIMITED -Do Not Intubate/DNI    DVT prophylaxis: enoxaparin (LOVENOX) injection 30 mg Start: 12/31/23 2000   Interim Hx: No acute events recorded overnight.  Afebrile.  Vital signs stable.  Renal function continues to improve.  In good spirits and conversant.  Appears stable.  No new complaints.  Tolerating oral intake without any difficulty.  Assessment & Plan:  Acute metabolic encephalopathy multifactorial due to acute cystitis dehydration and AKI complicating underlying mild dementia -CT head without acute findings - mental status consistently improving with treatment of inciting etiologies  Pansensitive E. coli acute cystitis without hematuria POA Has a history of recurrent UTIs -proven to be pansensitive on culture -transition to oral antibiotics today  Acute kidney injury Likely simply due to dehydration/prerenal state -baseline creatinine 1.01 with creatinine 1.91 at presentation -continues to improve with gentle volume resuscitation  Recent Labs  Lab 12/31/23 1446 01/01/24 0612 01/02/24 0625 01/03/24 0601  CREATININE 1.91* 1.66* 1.65* 1.29*     Mild rhabdomyolysis Scant details on fall or period of immobility as pt lives alone - CK 1132 at presentation - continue volume resuscitation - CK improving steadily -recheck in a.m. for completeness  sake  Fall at home CT head and x-rays of the pelvis without acute findings - SNF rehab stay recommended by therapy and patient/family agree  Hypothyroidism TSH markedly elevated at 143 - is prescribed Synthroid 112 mcg daily - unclear if she has been compliant but I suspect she has not been -Free T4 low as well -no clinical findings suggestive of a hypothyroid crisis -continue Synthroid at current dose and recheck TSH in approximately 6 weeks  HTN Continue usual atenolol -blood pressure controlled  Complete heart block status post pacemaker  Hyperlipidemia Holding statin due to rhabdo -resume at time of discharge  Mild baseline dementia Continue usual Aricept  Anxiety/depression Continue usual home dose of Seroquel -well-controlled with no behavioral disturbances  Vitamin D deficiency 25-hydroxy vitamin D noted to be low at 19.3 -continue supplementation -will need recheck of vitamin D level in 8 weeks   Family Communication: Spoke with daughter at bedside  Disposition: Therapy is recommending SNF rehab stay - patient is now medically ready for discharge when bed available   Objective: Blood pressure 127/77, pulse 60, temperature 97.8 F (36.6 C), temperature source Oral, resp. rate 18, height 5\' 3"  (1.6 m), weight 42.3 kg, SpO2 95%.  Intake/Output Summary (Last 24 hours) at 01/03/2024 0830 Last data filed at 01/02/2024 2027 Gross per 24 hour  Intake 497.5 ml  Output --  Net 497.5 ml   Filed Weights   01/01/24 0226  Weight: 42.3 kg    Examination: General: No acute respiratory distress Lungs: Clear to auscultation bilaterally without wheezes or crackles Cardiovascular: Regular rate and rhythm without murmur gallop or rub normal S1 and S2 Abdomen: Nontender, nondistended, soft, bowel sounds positive, no rebound Extremities: No significant cyanosis,  clubbing, or edema bilateral lower extremities  CBC: Recent Labs  Lab 01/01/24 0612 01/02/24 0625 01/03/24 0601   WBC 11.2* 9.5 9.2  HGB 12.1 11.7* 11.0*  HCT 35.4* 34.9* 32.2*  MCV 94.4 96.1 93.6  PLT 147* 132* 117*   Basic Metabolic Panel: Recent Labs  Lab 01/01/24 0612 01/02/24 0625 01/03/24 0601  NA 135 136 134*  K 4.1 3.8 3.5  CL 100 102 102  CO2 24 24 22   GLUCOSE 125* 99 102*  BUN 38* 37* 28*  CREATININE 1.66* 1.65* 1.29*  CALCIUM 9.3 8.7* 8.5*   GFR: Estimated Creatinine Clearance: 20.9 mL/min (A) (by C-G formula based on SCr of 1.29 mg/dL (H)).   Scheduled Meds:  atenolol  25 mg Oral Daily   cephALEXin  500 mg Oral Q12H   cholecalciferol  1,000 Units Oral Daily   donepezil  10 mg Oral QHS   enoxaparin (LOVENOX) injection  30 mg Subcutaneous Q24H   levothyroxine  112 mcg Oral QAC breakfast   QUEtiapine  25 mg Oral TID   Continuous Infusions:  lactated ringers 50 mL/hr at 01/03/24 0153     LOS: 2 days   Lonia Blood, MD [facility] Office  [phone] Pager - Text Page per Loretha Stapler  If 7PM-7AM, please contact night-coverage per Amion 01/03/2024, 8:30 AM

## 2024-01-03 NOTE — Plan of Care (Signed)
  Problem: Clinical Measurements: Goal: Ability to maintain clinical measurements within normal limits will improve Outcome: Progressing   Problem: Activity: Goal: Risk for activity intolerance will decrease Outcome: Progressing   Problem: Nutrition: Goal: Adequate nutrition will be maintained Outcome: Progressing   Problem: Elimination: Goal: Will not experience complications related to bowel motility Outcome: Progressing Goal: Will not experience complications related to urinary retention Outcome: Progressing   Problem: Pain Managment: Goal: General experience of comfort will improve and/or be controlled Outcome: Progressing   Problem: Safety: Goal: Ability to remain free from injury will improve Outcome: Progressing

## 2024-01-03 NOTE — Plan of Care (Signed)

## 2024-01-03 NOTE — Plan of Care (Signed)

## 2024-01-03 NOTE — Progress Notes (Signed)
PT Cancellation Note  Patient Details Name: Danielle [name] MRN: [id number] DOB: [birth year]   Cancelled Treatment:    Reason Eval/Treat Not Completed: Other (comment) Pt declining PT session; requesting to rest.  Lillia Pauls, PT, DPT Acute Rehabilitation Services Office [phone]    Norval Morton 01/03/2024, 9:24 AM

## 2024-01-03 NOTE — TOC Progression Note (Signed)
Transition of Care [facility]) - Progression Note    Patient Details  Name: Danielle [name] MRN: [id number] Date of Birth: [birth year]  Transition of Care [facility]) CM/SW Contact  Carley Hammed, LCSW Phone Number: 01/03/2024, 1:51 PM  Clinical Narrative:    CSW met with pt and dtr at bedside and provided Medicare list with bed offers. Dtr notes their preference is for [facility] and to start process, but family wanted to discuss what taking pt home would look like. All questions answered on what services would be available. Family to discuss with pt. Auth pending for [place], [place] following. TOC will continue to follow.    Expected Discharge Plan: Skilled Nursing Facility Barriers to Discharge: Continued Medical Work up, English as a second language teacher, SNF Pending bed offer  Expected Discharge Plan and Services In-house Referral: Clinical Social Work   Post Acute Care Choice: Skilled Nursing Facility Living arrangements for the past 2 months: Single Family Home                                       Social Determinants of Health (SDOH) Interventions SDOH Screenings   Food Insecurity: Patient Unable To Answer (12/31/2023)  Housing: Unknown (12/31/2023)  Transportation Needs: No Transportation Needs (12/31/2023)  Utilities: Not At Risk (12/31/2023)  Alcohol Screen: Low Risk  (04/15/2023)  Depression (PHQ2-9): Low Risk  (10/30/2023)  Financial Resource Strain: Low Risk  (04/15/2023)  Physical Activity: Sufficiently Active (04/15/2023)  Social Connections: Moderately Isolated (12/31/2023)  Stress: Stress Concern Present (04/15/2023)  Tobacco Use: Medium Risk (12/31/2023)    Readmission Risk Interventions     No data to display

## 2024-01-04 DIAGNOSIS — G9341 Metabolic encephalopathy: Secondary | ICD-10-CM | POA: Diagnosis not present

## 2024-01-04 LAB — BASIC METABOLIC PANEL
Anion gap: 10 (ref 5–15)
BUN: 28 mg/dL — ABNORMAL HIGH (ref 8–23)
CO2: 23 mmol/L (ref 22–32)
Calcium: 8.6 mg/dL — ABNORMAL LOW (ref 8.9–10.3)
Chloride: 103 mmol/L (ref 98–111)
Creatinine, Ser: 1.35 mg/dL — ABNORMAL HIGH (ref 0.44–1.00)
GFR, Estimated: 38 mL/min — ABNORMAL LOW (ref >60)
Glucose, Bld: 108 mg/dL — ABNORMAL HIGH (ref 70–99)
Potassium: 3.6 mmol/L (ref 3.5–5.1)
Sodium: 136 mmol/L (ref 135–145)

## 2024-01-04 LAB — CK: Total CK: 126 U/L (ref 38–234)

## 2024-01-04 NOTE — Progress Notes (Signed)
Physical Therapy Treatment Patient Details Name: Danielle [name] MRN: [id number] DOB: 02/16/1936 Today's Date: 01/04/2024   History of Present Illness Danielle [name] is a 88 y.o. female presenting to the ED for evaluation of altered mental status and fall. Found to have UTI.  PHMx: mild dementia, hypothyroidism, HTN, HLD, anxiety and depression, complete AV block s/p PPM, UTIs, and breast cancer    PT Comments  Patient agreeable to participate with therapy today. Patient continues to demonstrate inconsistencies with date, time, and location - short term memory affected. Patient required consistent reminders of A&Ox3, educated on tracking the calendar in her room to recall date/time. Patient reports that she does not care what today's date/time is and expresses her unwillingness to survive. Expresses that she wants to lay in bed all day. Under supervision, the patient was able to transition from supine > sit on her own. Demonstrates good trunk mobility and moves bilateral LEs to EOB. Patient requires Min A +1 to assist with push off from a seated position due to the bed being lower to the ground. Notable initial unsteadiness when transitioning from a seated position > standing. Ambulated with a SPC and HHA +1 to the bathroom, however switched AD to a RW due to apparent unsteadiness. Continued to walk 77ft within the unit with a RW w/ CGA to ensure safety, gait speed of 0.68 indicative of a household ambulator. Practiced toileting and washing hands at the sink prior to ambulation within the unit. Patient will benefit from continued inpatient follow up therapy, <3 hours/day due to cognitive impairment, decreased caregiver support, and high fall risk status.    If plan is discharge home, recommend the following: A little help with walking and/or transfers;A little help with bathing/dressing/bathroom;Assistance with cooking/housework;Direct supervision/assist for medications management;Direct  supervision/assist for financial management;Assist for transportation;Help with stairs or ramp for entrance   Can travel by private vehicle     Yes  Equipment Recommendations  Rolling walker (2 wheels)    Recommendations for Other Services       Precautions / Restrictions Precautions Precautions: Fall Recall of Precautions/Restrictions: Intact Restrictions Weight Bearing Restrictions Per Provider Order: No     Mobility  Bed Mobility Overal bed mobility: Needs Assistance Bed Mobility: Supine to Sit     Supine to sit: Supervision     General bed mobility comments: Patient demonstrates good trunk control to transition to EOB, able to move legs without assistance to EOB    Transfers Overall transfer level: Needs assistance Equipment used: Straight cane, Rolling walker (2 wheels) Transfers: Sit to/from Stand Sit to Stand: Min assist           General transfer comment: Patient able to use bilateral UE support to push off from bed, Min A +1 to assist with push from seated position. Changed AD from [facility] to RW due to unsteadiness    Ambulation/Gait Ambulation/Gait assistance: Contact guard assist, Min assist Gait Distance (Feet): 80 Feet Assistive device: Rolling walker (2 wheels), Straight cane, 1 person hand held assist Gait Pattern/deviations: Step-through pattern, Decreased stride length, Shuffle Gait velocity: decreased Gait velocity interpretation: <1.31 ft/sec, indicative of household ambulator   General Gait Details: Shuffling gait pattern, consistent cueiing to increase gait speed   Stairs             Wheelchair Mobility     Tilt Bed    Modified Rankin (Stroke Patients Only)       Balance Overall balance assessment: Needs assistance Sitting-balance support: No upper  extremity supported, Feet supported Sitting balance-Leahy Scale: Fair   Postural control: Posterior lean (When transitioning from a sit > stand) Standing balance support:  Bilateral upper extremity supported, Reliant on assistive device for balance Standing balance-Leahy Scale: Poor Standing balance comment: Stood at sink to wash hands, has to have external support to remain steady                            Communication Communication Communication: No apparent difficulties  Cognition Arousal: Alert Behavior During Therapy: WFL for tasks assessed/performed   PT - Cognitive impairments: History of cognitive impairments, Orientation, Memory   Orientation impairments: Place, Time, Situation                   PT - Cognition Comments: Pt with hx of dementia, but demonstrates acute confusion, A&Ox3, decreased short term memory Following commands: Intact Following commands impaired: Follows one step commands inconsistently, Follows one step commands with increased time    Cueing Cueing Techniques: Verbal cues, Gestural cues, Tactile cues  Exercises      General Comments        Pertinent Vitals/Pain Pain Assessment Pain Assessment: No/denies pain Pain Location: L leg/knee Pain Descriptors / Indicators: Sore Pain Intervention(s): Monitored during session    Home Living                          Prior Function            PT Goals (current goals can now be found in the care plan section) Acute Rehab PT Goals PT Goal Formulation: With patient/family Time For Goal Achievement: 01/15/24 Potential to Achieve Goals: Fair Progress towards PT goals: Progressing toward goals    Frequency    Min 1X/week      PT Plan      Co-evaluation              AM-PAC PT "6 Clicks" Mobility   Outcome Measure  Help needed turning from your back to your side while in a flat bed without using bedrails?: A Little Help needed moving from lying on your back to sitting on the side of a flat bed without using bedrails?: A Little Help needed moving to and from a bed to a chair (including a wheelchair)?: A Little Help needed  standing up from a chair using your arms (e.g., wheelchair or bedside chair)?: A Little Help needed to walk in hospital room?: A Little Help needed climbing 3-5 steps with a railing? : A Lot 6 Click Score: 17    End of Session Equipment Utilized During Treatment: Gait belt Activity Tolerance: Patient tolerated treatment well Patient left: in chair;with call bell/phone within reach;with chair alarm set Nurse Communication: Other (comment) (Notified NT to replace pure wick) PT Visit Diagnosis: Unsteadiness on feet (R26.81);History of falling (Z91.81)     Time: 0102-7253 PT Time Calculation (min) (ACUTE ONLY): 42 min  Charges:    $Gait Training: 8-22 mins $Therapeutic Activity: 23-37 mins PT General Charges $$ ACUTE PT VISIT: 1 Visit                    Doreen Beam, SPT   Keidrick Murty 01/04/2024, 11:30 AM

## 2024-01-04 NOTE — Progress Notes (Signed)
Mobility Specialist Progress Note:   01/04/24 1427  Mobility  Activity Transferred from chair to bed  Level of Assistance Contact guard assist, steadying assist  Assistive Device  (HHA)  Distance Ambulated (ft) 3 ft  Activity Response Tolerated well  Mobility Referral Yes  Mobility visit 1 Mobility  Mobility Specialist Start Time (ACUTE ONLY) 1415  Mobility Specialist Stop Time (ACUTE ONLY) 1425  Mobility Specialist Time Calculation (min) (ACUTE ONLY) 10 min   Pt received in chair, agreeable to transfer to bed. Pt found very emotional d/t not knowing how to talk to her daughter. NT present in room and was able to contact daughter for pt. CG HHA to stand and pivot to bed from chair. Pt left in bed with call bell in reach and all needs met. Bed alarm on.   Leory Plowman  Mobility Specialist Please contact via SecureChat Rehab office at [phone]

## 2024-01-04 NOTE — Progress Notes (Signed)
Occupational Therapy Treatment Patient Details Name: Danielle [name] MRN: [id number] DOB: June 26, 1936 Today's Date: 01/04/2024   History of present illness Danielle [name] is a 88 y.o. female presenting to the ED for evaluation of altered mental status and fall. Found to have UTI.  PHMx: mild dementia, hypothyroidism, HTN, HLD, anxiety and depression, complete AV block s/p PPM, UTIs, and breast cancer   OT comments  This 88 yo female admitted with above presents to acute OT today CGA sit<>stand and Min A for ambulation with rollator. She needed some direction with rollator to turn in bathroom but in open spaces did well with it. She still is a bit off balance in standing without UE A. She will continue to benefit from acute OT with follow up from continued inpatient follow up therapy, <3 hours/day.       If plan is discharge home, recommend the following:  A little help with walking and/or transfers;A little help with bathing/dressing/bathroom;Assistance with cooking/housework;Assist for transportation;Help with stairs or ramp for entrance;Direct supervision/assist for financial management;Direct supervision/assist for medications management   Equipment Recommendations  Other (comment) (TBD next venue)       Precautions / Restrictions Precautions Precautions: Fall Recall of Precautions/Restrictions: Intact Restrictions Weight Bearing Restrictions Per Provider Order: No       Mobility Bed Mobility Overal bed mobility: Needs Assistance Bed Mobility: Supine to Sit, Sit to Supine     Supine to sit: Supervision Sit to supine: Supervision        Transfers Overall transfer level: Needs assistance Equipment used: Rollator (4 wheels) Transfers: Sit to/from Stand Sit to Stand: Contact guard assist           General transfer comment: Pt ambulated with rollator with CGA 100 feet     Balance Overall balance assessment: Needs assistance Sitting-balance support: No upper  extremity supported, Feet supported Sitting balance-Leahy Scale: Good     Standing balance support: No upper extremity supported Standing balance-Leahy Scale: Poor Standing balance comment: unsteady with standing                           ADL either performed or assessed with clinical judgement   ADL Overall ADL's : Needs assistance/impaired     Grooming: Contact guard assist;Standing;Wash/dry hands                   Toilet Transfer: Minimal assistance;Ambulation;Rollator (4 wheels) Toilet Transfer Details (indicate cue type and reason): Pt new to a rollator so had trouble manuvering it in a small space--wanted to try this with her since the last time I saw her she only walked to bathroom and back with me due to being tired from walking Toileting- Clothing Manipulation and Hygiene: Contact guard assist;Sit to/from stand              Extremity/Trunk Assessment Upper Extremity Assessment Upper Extremity Assessment: Overall WFL for tasks assessed            Vision Patient Visual Report: No change from baseline           Communication Communication Communication: No apparent difficulties   Cognition Arousal: Alert Behavior During Therapy: WFL for tasks assessed/performed Cognition: Cognition impaired   Orientation impairments:  (could not get year) Awareness: Intellectual awareness impaired, Online awareness impaired Memory impairment (select all impairments): Declarative long-term memory Attention impairment (select first level of impairment): Sustained attention  Following commands: Intact        Cueing   Cueing Techniques: Verbal cues             Pertinent Vitals/ Pain       Pain Assessment Pain Assessment: Faces Faces Pain Scale: Hurts a little bit Pain Location: L leg/knee Pain Descriptors / Indicators: Sore Pain Intervention(s): Limited activity within patient's tolerance, Repositioned          Frequency  Min 1X/week        Progress Toward Goals  OT Goals(current goals can now be found in the care plan section)  Progress towards OT goals: Progressing toward goals  Acute Rehab OT Goals Patient Stated Goal: to go home Time For Goal Achievement: 01/15/24 Potential to Achieve Goals: Good         AM-PAC OT "6 Clicks" Daily Activity     Outcome Measure   Help from another person eating meals?: None Help from another person taking care of personal grooming?: A Little Help from another person toileting, which includes using toliet, bedpan, or urinal?: A Little Help from another person bathing (including washing, rinsing, drying)?: A Little Help from another person to put on and taking off regular upper body clothing?: A Little Help from another person to put on and taking off regular lower body clothing?: A Little 6 Click Score: 19    End of Session Equipment Utilized During Treatment: Gait belt;Rollator (4 wheels)  OT Visit Diagnosis: Unsteadiness on feet (R26.81);Other abnormalities of gait and mobility (R26.89);History of falling (Z91.81);Other symptoms and signs involving cognitive function   Activity Tolerance Patient tolerated treatment well   Patient Left in bed;with call bell/phone within reach;with bed alarm set           Time: 1432-1451 OT Time Calculation (min): 19 min  Charges: OT General Charges $OT Visit: 1 Visit OT Treatments $Self Care/Home Management : 8-22 mins Lindon Romp OT Acute Rehabilitation Services Office [phone]    Evette Georges 01/04/2024, 3:55 PM

## 2024-01-04 NOTE — TOC Progression Note (Addendum)
Transition of Care [facility]) - Progression Note    Patient Details  Name: Danielle [name] MRN: [id number] Date of Birth: 10-01-1936  Transition of Care [facility]) CM/SW Contact  Carley Hammed, LCSW Phone Number: 01/04/2024, 10:15 AM  Clinical Narrative:    CSW notified by MD that pt is medically stable to DC. Insurance Berkley Harvey is pending, CSW will send in updated PT note from today when available, as pt refused yesterday. CSW spoke with dtr Magda Paganini and provided update. CSW confirmed with dtr that pt does not have a legal guardian, flag was removed. TOC will continue to follow for DC needs.   2/14 New PT note updated  [place] confirms they can take over the weekend if approved.  Expected Discharge Plan: Skilled Nursing Facility Barriers to Discharge: Continued Medical Work up, English as a second language teacher, SNF Pending bed offer  Expected Discharge Plan and Services In-house Referral: Clinical Social Work   Post Acute Care Choice: Skilled Nursing Facility Living arrangements for the past 2 months: Single Family Home                                       Social Determinants of Health (SDOH) Interventions SDOH Screenings   Food Insecurity: Patient Unable To Answer (12/31/2023)  Housing: Unknown (12/31/2023)  Transportation Needs: No Transportation Needs (12/31/2023)  Utilities: Not At Risk (12/31/2023)  Alcohol Screen: Low Risk  (04/15/2023)  Depression (PHQ2-9): Low Risk  (10/30/2023)  Financial Resource Strain: Low Risk  (04/15/2023)  Physical Activity: Sufficiently Active (04/15/2023)  Social Connections: Moderately Isolated (12/31/2023)  Stress: Stress Concern Present (04/15/2023)  Tobacco Use: Medium Risk (12/31/2023)    Readmission Risk Interventions     No data to display

## 2024-01-04 NOTE — Progress Notes (Signed)
Mobility Specialist Progress Note:   01/04/24 1425  Mobility  Activity Ambulated with assistance in room  Level of Assistance Contact guard assist, steadying assist  Assistive Device Front wheel walker  Distance Ambulated (ft) 80 ft  Activity Response Tolerated well  Mobility Referral Yes  Mobility visit 1 Mobility  Mobility Specialist Start Time (ACUTE ONLY) 1150  Mobility Specialist Stop Time (ACUTE ONLY) 1210  Mobility Specialist Time Calculation (min) (ACUTE ONLY) 20 min   Pt received in chair, agreeable to mobility. CG to stand and ambulate multiple laps in room. No unsteadiness present. Pt requesting to use BR during session. Void successful. Pt performed pericare independently. Pt returned to chair with call bell in reach and all needs met. Chair alarm on.   Leory Plowman  Mobility Specialist Please contact via SecureChat Rehab office at [phone]

## 2024-01-04 NOTE — Progress Notes (Signed)
Danielle [name]  XBJ:[id number] DOB: 1936-11-02 DOA: 12/31/2023 PCP: Corwin Levins, MD    Brief Narrative:  88 year old who lives independently with a history of mild dementia, hypothyroidism, HTN, HLD, anxiety/depression, AV block status post PPM, frequent UTIs, and breast cancer who presented to the ER with altered mental status generalized weakness and a possible fall at home.  At baseline she is reportedly alert and oriented x 4.  She was diagnosed with acute cystitis without hematuria as well as dehydration with AKI and mild rhabdomyolysis.   Goals of Care:   Code Status: Limited: Do not attempt resuscitation (DNR) -DNR-LIMITED -Do Not Intubate/DNI    DVT prophylaxis: enoxaparin (LOVENOX) injection 30 mg Start: 12/31/23 2000   Interim Hx: Afebrile.  Vital signs stable.  No acute events recorded overnight.  CK has normalized.  Sitting up in a bedside chair.  No respiratory distress or apparent discomfort.  Assessment & Plan:  Acute metabolic encephalopathy multifactorial due to acute cystitis dehydration and AKI complicating underlying mild dementia -CT head without acute findings - mental status improved with treatment of inciting etiologies  Pansensitive E. coli acute cystitis without hematuria POA Has a history of recurrent UTIs -proven to be pansensitive on culture -transitioned to oral antibiotics 01/03/24  Acute kidney injury Likely simply due to dehydration/prerenal state -baseline creatinine 1.01 with creatinine 1.91 at presentation -resolved with volume resuscitation  Mild rhabdomyolysis Scant details on fall or period of immobility as pt lives alone - CK 1132 at presentation -treated with volume resuscitation - CK has normalized  Fall at home CT head and x-rays of the pelvis without acute findings - SNF rehab stay recommended by therapy and patient/family agree  Hypothyroidism TSH markedly elevated at 143 - is prescribed Synthroid 112 mcg daily - unclear if she has  been compliant but I suspect she has not been -Free T4 low as well -no clinical findings suggestive of a hypothyroid crisis -continue Synthroid at current dose and recheck TSH in approximately 6 weeks  HTN Continue usual atenolol -blood pressure controlled  Complete heart block status post pacemaker  Hyperlipidemia Holding statin due to rhabdo -resume at time of discharge  Mild baseline dementia Continue usual Aricept  Anxiety/depression Continue usual home dose of Seroquel -well-controlled with no behavioral disturbances  Vitamin D deficiency 25-hydroxy vitamin D noted to be low at 19.3 -continue supplementation -will need recheck of vitamin D level in 8 weeks   Family Communication: No family present time of exam today Disposition: Therapy is recommending SNF rehab stay - patient is now medically ready for discharge when SNF rehab bed available   Objective: Blood pressure 123/66, pulse (!) 59, temperature (!) 97.5 F (36.4 C), temperature source Oral, resp. rate 16, height 5\' 3"  (1.6 m), weight 42.3 kg, SpO2 96%.  Intake/Output Summary (Last 24 hours) at 01/04/2024 0908 Last data filed at 01/03/2024 2015 Gross per 24 hour  Intake 220 ml  Output --  Net 220 ml   Filed Weights   01/01/24 0226  Weight: 42.3 kg    Examination: General: No acute respiratory distress Lungs: Clear to auscultation bilaterally without wheezes or crackles Cardiovascular: Regular rate and rhythm without murmur gallop or rub normal S1 and S2 Abdomen: NT/ND, soft, BS positive Extremities: No significant cyanosis, clubbing, or edema bilateral lower extremities  CBC: Recent Labs  Lab 01/01/24 0612 01/02/24 0625 01/03/24 0601  WBC 11.2* 9.5 9.2  HGB 12.1 11.7* 11.0*  HCT 35.4* 34.9* 32.2*  MCV 94.4 96.1 93.6  PLT 147* 132* 117*   Basic Metabolic Panel: Recent Labs  Lab 01/02/24 0625 01/03/24 0601 01/04/24 0626  NA 136 134* 136  K 3.8 3.5 3.6  CL 102 102 103  CO2 24 22 23   GLUCOSE  99 102* 108*  BUN 37* 28* 28*  CREATININE 1.65* 1.29* 1.35*  CALCIUM 8.7* 8.5* 8.6*   GFR: Estimated Creatinine Clearance: 20 mL/min (A) (by C-G formula based on SCr of 1.35 mg/dL (H)).   Scheduled Meds:  atenolol  25 mg Oral Daily   cephALEXin  500 mg Oral Q12H   cholecalciferol  1,000 Units Oral Daily   donepezil  10 mg Oral QHS   enoxaparin (LOVENOX) injection  30 mg Subcutaneous Q24H   levothyroxine  112 mcg Oral QAC breakfast   QUEtiapine  25 mg Oral TID     LOS: 3 days   Lonia Blood, MD [facility] Office  [phone] Pager - Text Page per Loretha Stapler  If 7PM-7AM, please contact night-coverage per Amion 01/04/2024, 9:08 AM

## 2024-01-05 DIAGNOSIS — G9341 Metabolic encephalopathy: Secondary | ICD-10-CM | POA: Diagnosis not present

## 2024-01-05 NOTE — Progress Notes (Signed)
Danielle [name]  ZOX:[id number] DOB: May 02, 1936 DOA: 12/31/2023 PCP: Corwin Levins, MD    Brief Narrative:  88 year old who lives independently with a history of mild dementia, hypothyroidism, HTN, HLD, anxiety/depression, AV block status post PPM, frequent UTIs, and breast cancer who presented to the ER with altered mental status generalized weakness and a possible fall at home.  At baseline she is reportedly alert and oriented x 4.  She was diagnosed with acute cystitis without hematuria as well as dehydration with AKI and mild rhabdomyolysis.   Goals of Care:   Code Status: Limited: Do not attempt resuscitation (DNR) -DNR-LIMITED -Do Not Intubate/DNI    DVT prophylaxis: enoxaparin (LOVENOX) injection 30 mg Start: 12/31/23 2000   Interim Hx: No acute events reported overnight.  Afebrile.  Vital signs stable.  Resting comfortably in bed at time of visit.  Assessment & Plan:  Acute metabolic encephalopathy multifactorial due to acute cystitis, dehydration, and AKI complicating underlying mild dementia - CT head without acute findings - mental status improved with treatment of inciting etiologies  Pansensitive E. coli acute cystitis without hematuria POA Has a history of recurrent UTIs -proven to be pansensitive on culture -transitioned to oral antibiotics 01/03/24  Acute kidney injury Likely simply due to dehydration/prerenal state -baseline creatinine 1.01 with creatinine 1.91 at presentation -resolved with volume resuscitation  Mild rhabdomyolysis Scant details on fall or period of immobility as pt lives alone - CK 1132 at presentation -treated with volume resuscitation - CK has normalized  Fall at home CT head and x-rays of the pelvis without acute findings - SNF rehab stay recommended by therapy and patient/family agree  Hypothyroidism TSH markedly elevated at 143 - is prescribed Synthroid 112 mcg daily - unclear if she has been compliant but I suspect she has not been -Free  T4 low as well -no clinical findings suggestive of a hypothyroid crisis -continue Synthroid at current dose and recheck TSH in approximately 6 weeks  HTN Continue usual atenolol -blood pressure controlled  Complete heart block status post pacemaker  Hyperlipidemia Held statin initially in setting of rhabdo -resume now  Mild baseline dementia Continue usual Aricept  Anxiety/depression Continue usual home dose of Seroquel -well-controlled with no behavioral disturbances  Vitamin D deficiency 25-hydroxy vitamin D noted to be low at 19.3 -continue supplementation -will need recheck of vitamin D level in 8 weeks   Family Communication: No family present time of exam today Disposition: medically ready for discharge when SNF rehab bed available -currently awaiting insurance authorization   Objective: Blood pressure 134/65, pulse 60, temperature 98 F (36.7 C), temperature source Oral, resp. rate 16, height 5\' 3"  (1.6 m), weight 42.3 kg, SpO2 92%.  Intake/Output Summary (Last 24 hours) at 01/05/2024 0856 Last data filed at 01/04/2024 2200 Gross per 24 hour  Intake 100 ml  Output --  Net 100 ml   Filed Weights   01/01/24 0226  Weight: 42.3 kg    Examination: General: No acute respiratory distress Lungs: Clear to auscultation bilaterally without wheezes or crackles Cardiovascular: RRR without murmur Abdomen: NT/ND, soft, BS positive Extremities: No significant cyanosis, clubbing, or edema bilateral lower extremities  CBC: Recent Labs  Lab 01/01/24 0612 01/02/24 0625 01/03/24 0601  WBC 11.2* 9.5 9.2  HGB 12.1 11.7* 11.0*  HCT 35.4* 34.9* 32.2*  MCV 94.4 96.1 93.6  PLT 147* 132* 117*   Basic Metabolic Panel: Recent Labs  Lab 01/02/24 0625 01/03/24 0601 01/04/24 0626  NA 136 134* 136  K 3.8  3.5 3.6  CL 102 102 103  CO2 24 22 23   GLUCOSE 99 102* 108*  BUN 37* 28* 28*  CREATININE 1.65* 1.29* 1.35*  CALCIUM 8.7* 8.5* 8.6*   GFR: Estimated Creatinine Clearance:  20 mL/min (A) (by C-G formula based on SCr of 1.35 mg/dL (H)).   Scheduled Meds:  atenolol  25 mg Oral Daily   cephALEXin  500 mg Oral Q12H   cholecalciferol  1,000 Units Oral Daily   donepezil  10 mg Oral QHS   enoxaparin (LOVENOX) injection  30 mg Subcutaneous Q24H   levothyroxine  112 mcg Oral QAC breakfast   QUEtiapine  25 mg Oral TID     LOS: 4 days   Lonia Blood, MD [facility] Office  [phone] Pager - Text Page per Loretha Stapler  If 7PM-7AM, please contact night-coverage per Amion 01/05/2024, 8:56 AM

## 2024-01-05 NOTE — Plan of Care (Signed)

## 2024-01-05 NOTE — TOC Progression Note (Signed)
Transition of Care [facility]) - Progression Note    Patient Details  Name: Danielle [name] MRN: [id number] Date of Birth: 1936/07/04  Transition of Care [facility]) CM/SW Contact  Elray Dains, [place], [place] Phone Number: 01/05/2024, 1:07 PM  Clinical Narrative:     Phone call to [facility] to follow up on authorization for SNF stay at [facility]. Awaiting a return call.  Tobe Kervin, LCSW Transition of Care    Expected Discharge Plan: Skilled Nursing Facility Barriers to Discharge: Continued Medical Work up, English as a second language teacher, SNF Pending bed offer  Expected Discharge Plan and Services In-house Referral: Clinical Social Work   Post Acute Care Choice: Skilled Nursing Facility Living arrangements for the past 2 months: Single Family Home                                       Social Determinants of Health (SDOH) Interventions SDOH Screenings   Food Insecurity: Patient Unable To Answer (12/31/2023)  Housing: Unknown (12/31/2023)  Transportation Needs: No Transportation Needs (12/31/2023)  Utilities: Not At Risk (12/31/2023)  Alcohol Screen: Low Risk  (04/15/2023)  Depression (PHQ2-9): Low Risk  (10/30/2023)  Financial Resource Strain: Low Risk  (04/15/2023)  Physical Activity: Sufficiently Active (04/15/2023)  Social Connections: Moderately Isolated (12/31/2023)  Stress: Stress Concern Present (04/15/2023)  Tobacco Use: Medium Risk (12/31/2023)    Readmission Risk Interventions     No data to display

## 2024-01-05 NOTE — TOC Progression Note (Addendum)
Transition of Care [facility]) - Progression Note    Patient Details  Name: Danielle [name] MRN: [id number] Date of Birth: January 06, 1936  Transition [facility] Care [facility]) CM/SW Contact  Keelee Yankey, [place], [place] Phone Number: 01/05/2024, 3:32 PM  Clinical Narrative:    Confirmed that [facility] has authorization for skilled care. Patient can admit tomorrow.  4:21pm Patient and daughter made aware of discharge plan.  Korby Ratay, LCSW [place]  [facility], [facility] Licensed Clinical Social Worker Care Coordinator  Direct Dial: [phone]      Expected Discharge Plan: Skilled Nursing Facility Barriers to Discharge: Continued Medical Work up, English as a second language teacher, SNF Pending bed offer  Expected Discharge Plan and Services In-house Referral: Clinical Social Work   Post Acute Care Choice: Skilled Nursing Facility Living arrangements for the past 2 months: Single Family Home                                       Social Determinants of Health (SDOH) Interventions SDOH Screenings   Food Insecurity: Patient Unable To Answer (12/31/2023)  Housing: Unknown (12/31/2023)  Transportation Needs: No Transportation Needs (12/31/2023)  Utilities: Not At Risk (12/31/2023)  Alcohol Screen: Low Risk  (04/15/2023)  Depression (PHQ2-9): Low Risk  (10/30/2023)  Financial Resource Strain: Low Risk  (04/15/2023)  Physical Activity: Sufficiently Active (04/15/2023)  Social Connections: Moderately Isolated (12/31/2023)  Stress: Stress Concern Present (04/15/2023)  Tobacco Use: Medium Risk (12/31/2023)    Readmission Risk Interventions     No data to display

## 2024-01-06 DIAGNOSIS — M1990 Unspecified osteoarthritis, unspecified site: Secondary | ICD-10-CM | POA: Diagnosis not present

## 2024-01-06 DIAGNOSIS — F0282 Dementia in other diseases classified elsewhere, unspecified severity, with psychotic disturbance: Secondary | ICD-10-CM | POA: Diagnosis not present

## 2024-01-06 DIAGNOSIS — M545 Low back pain, unspecified: Secondary | ICD-10-CM | POA: Diagnosis not present

## 2024-01-06 DIAGNOSIS — N390 Urinary tract infection, site not specified: Secondary | ICD-10-CM | POA: Diagnosis not present

## 2024-01-06 DIAGNOSIS — F330 Major depressive disorder, recurrent, mild: Secondary | ICD-10-CM | POA: Diagnosis not present

## 2024-01-06 DIAGNOSIS — Z950 Presence of cardiac pacemaker: Secondary | ICD-10-CM | POA: Diagnosis not present

## 2024-01-06 DIAGNOSIS — F411 Generalized anxiety disorder: Secondary | ICD-10-CM | POA: Diagnosis not present

## 2024-01-06 DIAGNOSIS — F329 Major depressive disorder, single episode, unspecified: Secondary | ICD-10-CM | POA: Diagnosis not present

## 2024-01-06 DIAGNOSIS — E039 Hypothyroidism, unspecified: Secondary | ICD-10-CM | POA: Diagnosis not present

## 2024-01-06 DIAGNOSIS — R41841 Cognitive communication deficit: Secondary | ICD-10-CM | POA: Diagnosis not present

## 2024-01-06 DIAGNOSIS — F02811 Dementia in other diseases classified elsewhere, unspecified severity, with agitation: Secondary | ICD-10-CM | POA: Diagnosis not present

## 2024-01-06 DIAGNOSIS — Z87440 Personal history of urinary (tract) infections: Secondary | ICD-10-CM | POA: Diagnosis not present

## 2024-01-06 DIAGNOSIS — N1832 Chronic kidney disease, stage 3b: Secondary | ICD-10-CM | POA: Diagnosis not present

## 2024-01-06 DIAGNOSIS — R531 Weakness: Secondary | ICD-10-CM | POA: Diagnosis not present

## 2024-01-06 DIAGNOSIS — Z7189 Other specified counseling: Secondary | ICD-10-CM | POA: Diagnosis not present

## 2024-01-06 DIAGNOSIS — F02A0 Dementia in other diseases classified elsewhere, mild, without behavioral disturbance, psychotic disturbance, mood disturbance, and anxiety: Secondary | ICD-10-CM | POA: Diagnosis not present

## 2024-01-06 DIAGNOSIS — G8929 Other chronic pain: Secondary | ICD-10-CM | POA: Diagnosis not present

## 2024-01-06 DIAGNOSIS — I872 Venous insufficiency (chronic) (peripheral): Secondary | ICD-10-CM | POA: Diagnosis not present

## 2024-01-06 DIAGNOSIS — R1311 Dysphagia, oral phase: Secondary | ICD-10-CM | POA: Diagnosis not present

## 2024-01-06 DIAGNOSIS — H9193 Unspecified hearing loss, bilateral: Secondary | ICD-10-CM | POA: Diagnosis not present

## 2024-01-06 DIAGNOSIS — F03C11 Unspecified dementia, severe, with agitation: Secondary | ICD-10-CM | POA: Diagnosis not present

## 2024-01-06 DIAGNOSIS — G9341 Metabolic encephalopathy: Secondary | ICD-10-CM | POA: Diagnosis not present

## 2024-01-06 DIAGNOSIS — R4182 Altered mental status, unspecified: Secondary | ICD-10-CM | POA: Diagnosis not present

## 2024-01-06 DIAGNOSIS — M6281 Muscle weakness (generalized): Secondary | ICD-10-CM | POA: Diagnosis not present

## 2024-01-06 DIAGNOSIS — R748 Abnormal levels of other serum enzymes: Secondary | ICD-10-CM | POA: Diagnosis not present

## 2024-01-06 DIAGNOSIS — I129 Hypertensive chronic kidney disease with stage 1 through stage 4 chronic kidney disease, or unspecified chronic kidney disease: Secondary | ICD-10-CM | POA: Diagnosis not present

## 2024-01-06 DIAGNOSIS — D649 Anemia, unspecified: Secondary | ICD-10-CM | POA: Diagnosis not present

## 2024-01-06 DIAGNOSIS — Z7401 Bed confinement status: Secondary | ICD-10-CM | POA: Diagnosis not present

## 2024-01-06 DIAGNOSIS — F0284 Dementia in other diseases classified elsewhere, unspecified severity, with anxiety: Secondary | ICD-10-CM | POA: Diagnosis not present

## 2024-01-06 DIAGNOSIS — E785 Hyperlipidemia, unspecified: Secondary | ICD-10-CM | POA: Diagnosis not present

## 2024-01-06 DIAGNOSIS — R2689 Other abnormalities of gait and mobility: Secondary | ICD-10-CM | POA: Diagnosis not present

## 2024-01-06 MED ORDER — VITAMIN D3 25 MCG PO TABS: 1000.0000 [IU] | ORAL_TABLET | Freq: Every day | ORAL | Status: DC

## 2024-01-06 MED ORDER — ACETAMINOPHEN 325 MG PO TABS: 650.0000 mg | ORAL_TABLET | Freq: Four times a day (QID) | ORAL | Status: DC | PRN

## 2024-01-06 MED ORDER — CEPHALEXIN 500 MG PO CAPS: 500.0000 mg | ORAL_CAPSULE | Freq: Two times a day (BID) | ORAL | Status: AC

## 2024-01-06 NOTE — Progress Notes (Signed)
Mobility Specialist Progress Note:    01/06/24 1000  Mobility  Activity Dangled on edge of bed  Level of Assistance Contact guard assist, steadying assist  Assistive Device None  Activity Response Tolerated fair  Mobility Referral Yes  Mobility visit 1 Mobility  Mobility Specialist Start Time (ACUTE ONLY) 0835  Mobility Specialist Stop Time (ACUTE ONLY) 0843  Mobility Specialist Time Calculation (min) (ACUTE ONLY) 8 min   Pt received in bed and initially agreeable. Agitated this date and kept repeating "What's the point? I'm dying". Able to sit EOB w/ CG, though declined further mobility. Pt continued discussing death throughout. Left in bed with call bell and all needs met. Bed alarm on.  D'Vante Earlene Plater Mobility Specialist Please contact via Special educational needs teacher or Rehab office at [phone]

## 2024-01-06 NOTE — Progress Notes (Signed)
Report called to [place] at [facility]. Patient is to go to room 101p.

## 2024-01-06 NOTE — Discharge Summary (Signed)
DISCHARGE SUMMARY  Danielle [name]  MR#: [id number]  DOB:11/20/1936  Date of Admission: 12/31/2023 Date of Discharge: 01/06/2024  Attending Physician:Dorinda Stehr Silvestre Gunner, MD  Patient's GUY:QIHK, Danielle Blalock, MD  Disposition: D/C to SNF for rehab stay   Follow-up Appts:  Contact information for after-discharge care     Destination     [facility], [facility] Preferred SNF .   Service: Skilled Nursing Contact information: [address] [place] [address] [phone]                     Tests Needing Follow-up: - recheck TSH in approximately 6 weeks - will need recheck of vitamin D level in 8 weeks  Discharge Diagnoses: Acute metabolic encephalopathy Pansensitive E. coli acute cystitis without hematuria POA Acute kidney injury Mild rhabdomyolysis Fall at home Hypothyroidism HTN Complete heart block status post pacemaker Hyperlipidemia Mild baseline dementia Anxiety/depression Vitamin D deficiency  Initial presentation: 88 year old who lives independently with a history of mild dementia, hypothyroidism, HTN, HLD, anxiety/depression, AV block status post PPM, frequent UTIs, and breast cancer who presented to the ER with altered mental status generalized weakness and a possible fall at home. At baseline she is reportedly alert and oriented x 4. She was diagnosed with acute cystitis without hematuria as well as dehydration with AKI and mild rhabdomyolysis.   Hospital Course:  Acute metabolic encephalopathy multifactorial due to acute cystitis, dehydration, and AKI complicating underlying mild dementia - CT head without acute findings - mental status improved with treatment of inciting etiologies -patient has returned to her baseline at time of discharge   Pansensitive E. coli acute cystitis without hematuria POA Has a history of recurrent UTIs -proven to be pansensitive on culture -transitioned to oral antibiotics 01/03/24 with  1 more day of therapy required to complete a 7-day treatment course   Acute kidney injury Likely simply due to dehydration/prerenal state -baseline creatinine 1.01 with creatinine 1.91 at presentation -resolved with volume resuscitation   Mild rhabdomyolysis Scant details on fall or period of immobility as pt lives alone - CK 1132 at presentation -treated with volume resuscitation - CK has normalized   Fall at home CT head and x-rays of the pelvis without acute findings - SNF rehab stay recommended by therapy and patient/family agree   Hypothyroidism TSH markedly elevated at 143 - is prescribed Synthroid 112 mcg daily - unclear if she has been compliant but I suspect she has not been -Free T4 low as well -no clinical findings suggestive of a hypothyroid crisis -continue Synthroid at current dose and recheck TSH in approximately 6 weeks   HTN Continue usual atenolol -blood pressure controlled   Complete heart block status post pacemaker No complications encountered during this admission  Hyperlipidemia Held statin initially in setting of rhabdo - resumed prior to discharge   Mild baseline dementia Continue usual Aricept   Anxiety/depression Continue usual home dose of Seroquel -well-controlled with no behavioral disturbances   Vitamin D deficiency 25-hydroxy vitamin D noted to be low at 19.3 -continue supplementation -will need recheck of vitamin D level in 8 weeks  Allergies as of 01/06/2024       Reactions   Codeine Nausea And Vomiting   Pt is unaware of reaction    Oxycodone Nausea And Vomiting   Pt is unaware of reaction         Medication List     TAKE these medications    acetaminophen 325 MG tablet Commonly known  as: TYLENOL Take 2 tablets (650 mg total) by mouth every 6 (six) hours as needed for mild pain (pain score 1-3) (or Fever >/= 101). What changed:  medication strength how much to take reasons to take this   alendronate 70 MG tablet Commonly known  as: FOSAMAX Take 1 tablet (70 mg total) by mouth every 7 (seven) days. Take with a full glass of water on an empty stomach.   atenolol 50 MG tablet Commonly known as: TENORMIN Take 1 tablet (50 mg total) by mouth daily.   atorvastatin 40 MG tablet Commonly known as: LIPITOR Take 1 tablet by mouth daily.   CAL-MAG-ZINC-D PO Take 1 tablet by mouth in the morning and at bedtime.   cephALEXin 500 MG capsule Commonly known as: KEFLEX Take 1 capsule (500 mg total) by mouth every 12 (twelve) hours for 1 day.   donepezil 10 MG tablet Commonly known as: ARICEPT Take 1 tablet by mouth at bedtime.   levothyroxine 112 MCG tablet Commonly known as: SYNTHROID Take 1 tablet (112 mcg total) by mouth daily before breakfast.   QUEtiapine 25 MG tablet Commonly known as: SEROQUEL Take 1 tablet by mouth three times daily.   vitamin D3 25 MCG tablet Commonly known as: CHOLECALCIFEROL Take 1 tablet (1,000 Units total) by mouth daily.        Day of Discharge BP 130/87 (BP Location: Left Arm)   Pulse 60   Temp 97.6 F (36.4 C) (Oral)   Resp 18   Ht 5\' 3"  (1.6 m)   Wt 42.3 kg   SpO2 99%   BMI 16.52 kg/m   Physical Exam: General: No acute respiratory distress Lungs: Clear to auscultation bilaterally without wheezes or crackles Cardiovascular: Regular rate and rhythm without murmur gallop or rub normal S1 and S2 Abdomen: Nontender, nondistended, soft, bowel sounds positive, no rebound, no ascites, no appreciable mass Extremities: No significant cyanosis, clubbing, or edema bilateral lower extremities  Basic Metabolic Panel: Recent Labs  Lab 12/31/23 1446 01/01/24 0612 01/02/24 0625 01/03/24 0601 01/04/24 0626  NA 137 135 136 134* 136  K 4.1 4.1 3.8 3.5 3.6  CL 98 100 102 102 103  CO2 21* 24 24 22 23   GLUCOSE 133* 125* 99 102* 108*  BUN 36* 38* 37* 28* 28*  CREATININE 1.91* 1.66* 1.65* 1.29* 1.35*  CALCIUM 10.6* 9.3 8.7* 8.5* 8.6*    CBC: Recent Labs  Lab  12/31/23 1446 01/01/24 0612 01/02/24 0625 01/03/24 0601  WBC 14.6* 11.2* 9.5 9.2  HGB 13.3 12.1 11.7* 11.0*  HCT 39.6 35.4* 34.9* 32.2*  MCV 95.2 94.4 96.1 93.6  PLT 167 147* 132* 117*    Time spent in discharge (includes decision making & examination of pt): 35 minutes  01/06/2024, 8:34 AM   Lonia Blood, MD [facility] Office  [phone]

## 2024-01-06 NOTE — TOC Transition Note (Signed)
Transition of Care [facility]) - Discharge Note   Patient Details  Name: Danielle [name] MRN: [id number] Date of Birth: 01/26/1936  Transition of Care [facility]) CM/SW Contact:  Verna Czech [place], [place] Phone Number: 01/06/2024, 10:05 AM   Clinical Narrative:     Confirmed with [facility] that patient can admit today. Patient will be transported by [facility] and will go to room 101p. Report can be called in to [phone].  Sigmund Morera, LCSW Transition of Care      Barriers to Discharge: Continued Medical Work up, English as a second language teacher, SNF Pending bed offer   Patient Goals and CMS Choice Patient states their goals for this hospitalization and ongoing recovery are:: Pt wants to attend rehab to get her leg stronger. CMS Medicare.gov Compare Post Acute Care list provided to:: Patient Represenative (must comment) Choice offered to / list presented to : Patient, Adult Children      Discharge Placement                       Discharge Plan and Services Additional resources added to the After Visit Summary for   In-house Referral: Clinical Social Work   Post Acute Care Choice: Skilled Nursing Facility                               Social Drivers of Health (SDOH) Interventions SDOH Screenings   Food Insecurity: Patient Unable To Answer (12/31/2023)  Housing: Unknown (12/31/2023)  Transportation Needs: No Transportation Needs (12/31/2023)  Utilities: Not At Risk (12/31/2023)  Alcohol Screen: Low Risk  (04/15/2023)  Depression (PHQ2-9): Low Risk  (10/30/2023)  Financial Resource Strain: Low Risk  (04/15/2023)  Physical Activity: Sufficiently Active (04/15/2023)  Social Connections: Moderately Isolated (12/31/2023)  Stress: Stress Concern Present (04/15/2023)  Tobacco Use: Medium Risk (12/31/2023)     Readmission Risk Interventions     No data to display

## 2024-01-06 NOTE — Plan of Care (Signed)

## 2024-01-07 DIAGNOSIS — H9193 Unspecified hearing loss, bilateral: Secondary | ICD-10-CM | POA: Diagnosis not present

## 2024-01-07 DIAGNOSIS — N390 Urinary tract infection, site not specified: Secondary | ICD-10-CM | POA: Diagnosis not present

## 2024-01-07 DIAGNOSIS — I872 Venous insufficiency (chronic) (peripheral): Secondary | ICD-10-CM | POA: Diagnosis not present

## 2024-01-07 DIAGNOSIS — Z950 Presence of cardiac pacemaker: Secondary | ICD-10-CM | POA: Diagnosis not present

## 2024-01-07 DIAGNOSIS — M1990 Unspecified osteoarthritis, unspecified site: Secondary | ICD-10-CM | POA: Diagnosis not present

## 2024-01-07 DIAGNOSIS — E039 Hypothyroidism, unspecified: Secondary | ICD-10-CM | POA: Diagnosis not present

## 2024-01-07 DIAGNOSIS — G8929 Other chronic pain: Secondary | ICD-10-CM | POA: Diagnosis not present

## 2024-01-07 DIAGNOSIS — M545 Low back pain, unspecified: Secondary | ICD-10-CM | POA: Diagnosis not present

## 2024-01-07 DIAGNOSIS — R4182 Altered mental status, unspecified: Secondary | ICD-10-CM | POA: Diagnosis not present

## 2024-01-08 ENCOUNTER — Telehealth: Payer: Self-pay

## 2024-01-08 NOTE — Telephone Encounter (Signed)
Yes, she would normally be advised to f/u with [facility] for this purpose after hospn, unless she already has Care provided at her current living situation that she prefers (such as facility MD or NP)

## 2024-01-08 NOTE — Telephone Encounter (Signed)
Copied from CRM [id number]. Topic: Clinical - Medication Question >> Jan 08, 2024  1:01 PM Tiffany H wrote: Reason for CRM: Patient's daughter Lauris Poag called to request assistance updating medications at patient's nursing facility where she is post recent discharge for a bladder infection.   Patient's daughter advised that she's been off of her medications for the past six months. Patient was admitted on 12/31/23 where medications started again. During this hiatus, patient had a much improved mental status. She was relaxed and calm. Starting yesterday morning, patient has become paranoid, agitated, accusatory and mean.   Please assist. The change is dramatic and patient's daughter would like some help advocating to titrate patient's medication.

## 2024-01-09 NOTE — Telephone Encounter (Signed)
Called and left voicemail to call the office and schedule appt.

## 2024-01-14 ENCOUNTER — Encounter: Payer: Self-pay | Admitting: Internal Medicine

## 2024-01-14 DIAGNOSIS — F0282 Dementia in other diseases classified elsewhere, unspecified severity, with psychotic disturbance: Secondary | ICD-10-CM | POA: Diagnosis not present

## 2024-01-14 DIAGNOSIS — F02811 Dementia in other diseases classified elsewhere, unspecified severity, with agitation: Secondary | ICD-10-CM | POA: Diagnosis not present

## 2024-01-14 DIAGNOSIS — F0284 Dementia in other diseases classified elsewhere, unspecified severity, with anxiety: Secondary | ICD-10-CM | POA: Diagnosis not present

## 2024-01-14 DIAGNOSIS — E039 Hypothyroidism, unspecified: Secondary | ICD-10-CM | POA: Diagnosis not present

## 2024-01-17 DIAGNOSIS — Z87440 Personal history of urinary (tract) infections: Secondary | ICD-10-CM | POA: Diagnosis not present

## 2024-01-17 DIAGNOSIS — N1832 Chronic kidney disease, stage 3b: Secondary | ICD-10-CM | POA: Diagnosis not present

## 2024-01-17 DIAGNOSIS — R748 Abnormal levels of other serum enzymes: Secondary | ICD-10-CM | POA: Diagnosis not present

## 2024-01-17 DIAGNOSIS — F0282 Dementia in other diseases classified elsewhere, unspecified severity, with psychotic disturbance: Secondary | ICD-10-CM | POA: Diagnosis not present

## 2024-01-17 DIAGNOSIS — Z7189 Other specified counseling: Secondary | ICD-10-CM | POA: Diagnosis not present

## 2024-01-17 DIAGNOSIS — E039 Hypothyroidism, unspecified: Secondary | ICD-10-CM | POA: Diagnosis not present

## 2024-01-17 DIAGNOSIS — I129 Hypertensive chronic kidney disease with stage 1 through stage 4 chronic kidney disease, or unspecified chronic kidney disease: Secondary | ICD-10-CM | POA: Diagnosis not present

## 2024-01-17 DIAGNOSIS — F329 Major depressive disorder, single episode, unspecified: Secondary | ICD-10-CM | POA: Diagnosis not present

## 2024-01-17 DIAGNOSIS — E785 Hyperlipidemia, unspecified: Secondary | ICD-10-CM | POA: Diagnosis not present

## 2024-01-21 ENCOUNTER — Encounter: Payer: Self-pay | Admitting: Internal Medicine

## 2024-01-21 DIAGNOSIS — E785 Hyperlipidemia, unspecified: Secondary | ICD-10-CM | POA: Diagnosis not present

## 2024-01-21 DIAGNOSIS — Z87440 Personal history of urinary (tract) infections: Secondary | ICD-10-CM | POA: Diagnosis not present

## 2024-01-21 DIAGNOSIS — I129 Hypertensive chronic kidney disease with stage 1 through stage 4 chronic kidney disease, or unspecified chronic kidney disease: Secondary | ICD-10-CM | POA: Diagnosis not present

## 2024-01-21 DIAGNOSIS — N1832 Chronic kidney disease, stage 3b: Secondary | ICD-10-CM | POA: Diagnosis not present

## 2024-01-21 DIAGNOSIS — R748 Abnormal levels of other serum enzymes: Secondary | ICD-10-CM | POA: Diagnosis not present

## 2024-01-21 DIAGNOSIS — E039 Hypothyroidism, unspecified: Secondary | ICD-10-CM | POA: Diagnosis not present

## 2024-01-21 DIAGNOSIS — F329 Major depressive disorder, single episode, unspecified: Secondary | ICD-10-CM | POA: Diagnosis not present

## 2024-01-23 ENCOUNTER — Ambulatory Visit: Payer: Medicare HMO

## 2024-01-25 ENCOUNTER — Telehealth: Payer: Self-pay

## 2024-01-25 NOTE — Telephone Encounter (Signed)
 Copied from CRM [id number]. Topic: General - Other >> Jan 25, 2024  3:23 PM Turkey A wrote: Reason for CRM: [facility] called to inform office that have not been able to rach the patient but will continue to try. If they are able to reach her they will start PT,OT,Speech on 01/31/24

## 2024-01-29 ENCOUNTER — Encounter: Payer: Self-pay | Admitting: Internal Medicine

## 2024-01-29 ENCOUNTER — Other Ambulatory Visit: Payer: Self-pay | Admitting: Internal Medicine

## 2024-01-29 ENCOUNTER — Ambulatory Visit: Payer: Medicare HMO | Admitting: Internal Medicine

## 2024-01-29 VITALS — BP 120/72 | HR 90 | Temp 97.9°F | Ht 63.0 in | Wt 110.0 lb

## 2024-01-29 DIAGNOSIS — N390 Urinary tract infection, site not specified: Secondary | ICD-10-CM | POA: Diagnosis not present

## 2024-01-29 DIAGNOSIS — E559 Vitamin D deficiency, unspecified: Secondary | ICD-10-CM

## 2024-01-29 DIAGNOSIS — F03A4 Unspecified dementia, mild, with anxiety: Secondary | ICD-10-CM

## 2024-01-29 DIAGNOSIS — E039 Hypothyroidism, unspecified: Secondary | ICD-10-CM

## 2024-01-29 DIAGNOSIS — I10 Essential (primary) hypertension: Secondary | ICD-10-CM | POA: Diagnosis not present

## 2024-01-29 LAB — CBC WITH DIFFERENTIAL/PLATELET
Basophils Absolute: 0.1 10*3/uL (ref 0.0–0.1)
Basophils Relative: 1 % (ref 0.0–3.0)
Eosinophils Absolute: 0.2 10*3/uL (ref 0.0–0.7)
Eosinophils Relative: 2.3 % (ref 0.0–5.0)
HCT: 35 % — ABNORMAL LOW (ref 36.0–46.0)
Hemoglobin: 11.9 g/dL — ABNORMAL LOW (ref 12.0–15.0)
Lymphocytes Relative: 28.8 % (ref 12.0–46.0)
Lymphs Abs: 2.1 10*3/uL (ref 0.7–4.0)
MCHC: 34.1 g/dL (ref 30.0–36.0)
MCV: 97.7 fl (ref 78.0–100.0)
Monocytes Absolute: 0.8 10*3/uL (ref 0.1–1.0)
Monocytes Relative: 11.5 % (ref 3.0–12.0)
Neutro Abs: 4.1 10*3/uL (ref 1.4–7.7)
Neutrophils Relative %: 56.4 % (ref 43.0–77.0)
Platelets: 265 10*3/uL (ref 150.0–400.0)
RBC: 3.58 Mil/uL — ABNORMAL LOW (ref 3.87–5.11)
RDW: 15.6 % — ABNORMAL HIGH (ref 11.5–15.5)
WBC: 7.3 10*3/uL (ref 4.0–10.5)

## 2024-01-29 LAB — URINALYSIS, ROUTINE W REFLEX MICROSCOPIC
Bilirubin Urine: NEGATIVE
Ketones, ur: NEGATIVE
Nitrite: POSITIVE — AB
Specific Gravity, Urine: 1.025 (ref 1.000–1.030)
Total Protein, Urine: 100 — AB
Urine Glucose: NEGATIVE
Urobilinogen, UA: 0.2 (ref 0.0–1.0)
pH: 6 (ref 5.0–8.0)

## 2024-01-29 LAB — TSH: TSH: 56.26 u[IU]/mL — ABNORMAL HIGH (ref 0.35–5.50)

## 2024-01-29 LAB — HEPATIC FUNCTION PANEL
ALT: 20 U/L (ref 0–35)
AST: 19 U/L (ref 0–37)
Albumin: 4.6 g/dL (ref 3.5–5.2)
Alkaline Phosphatase: 74 U/L (ref 39–117)
Bilirubin, Direct: 0.2 mg/dL (ref 0.0–0.3)
Total Bilirubin: 1 mg/dL (ref 0.2–1.2)
Total Protein: 7.7 g/dL (ref 6.0–8.3)

## 2024-01-29 LAB — BASIC METABOLIC PANEL
BUN: 23 mg/dL (ref 6–23)
CO2: 28 meq/L (ref 19–32)
Calcium: 10.2 mg/dL (ref 8.4–10.5)
Chloride: 103 meq/L (ref 96–112)
Creatinine, Ser: 1.12 mg/dL (ref 0.40–1.20)
GFR: 44.64 mL/min — ABNORMAL LOW (ref >60.00)
Glucose, Bld: 109 mg/dL — ABNORMAL HIGH (ref 70–99)
Potassium: 3.8 meq/L (ref 3.5–5.1)
Sodium: 140 meq/L (ref 135–145)

## 2024-01-29 LAB — VITAMIN D 25 HYDROXY (VIT D DEFICIENCY, FRACTURES): VITD: 34.39 ng/mL (ref 30.00–100.00)

## 2024-01-29 MED ORDER — LEVOTHYROXINE SODIUM 137 MCG PO TABS: 137.0000 ug | ORAL_TABLET | Freq: Every day | ORAL | 3 refills | Status: DC

## 2024-01-29 NOTE — Patient Instructions (Addendum)
 Ok to take OTC Delsym for cough as needed  Ok to stay off the seroquel  Please continue all other medications as before, and refills have been done if requested.  Please have the pharmacy call with any other refills you may need.  Please keep your appointments with your specialists as you may have planned  Please go to the LAB at the blood drawing area for the tests to be done  You will be contacted by phone if any changes need to be made immediately.  Otherwise, you will receive a letter about your results with an explanation, but please check with MyChart first.  Please make an Appointment to return in June 10, or sooner if needed

## 2024-01-29 NOTE — Progress Notes (Signed)
 Patient ID: Danielle [name], female   DOB: 1935-11-25, 88 y.o.   MRN: [id number]        Chief Complaint: follow up post hospn feb 10 - 16 2025        HPI:  Danielle [name] is a 88 y.o. female here after hospn and rehab stay with confusion, UTI, AKI, abnormal TSH, dementia.  Pt denies chest pain, increased sob or doe, wheezing, orthopnea, PND, increased LE swelling, palpitations, dizziness or syncope.   Pt denies polydipsia, polyuria, or new focal neuro s/s.    Pt denies fever, wt loss, night sweats, loss of appetite, or other constitutional symptoms  Denies hyper or hypo thyroid symptoms such as voice, skin or hair change.       Wt Readings from Last 3 Encounters:  01/29/24 110 lb (49.9 kg)  01/01/24 93 lb 4.1 oz (42.3 kg)  10/30/23 114 lb (51.7 kg)   BP Readings from Last 3 Encounters:  01/29/24 120/72  01/06/24 110/64  10/30/23 128/82         Past Medical History:  Diagnosis Date   Acute bronchitis 10/20/2008   Allergic rhinitis 01/09/2008   Alopecia 01/21/2015   Arthritis 06/11/2020   Atrioventricular block, complete 12/16/2009   Blood in urine 02/22/2017   Cardiac pacemaker in situ 01/23/2012   Cervical radiculopathy 07/11/2013   Complete atrioventricular block 12/16/2009   Degenerative joint disease of right hip 12/17/2018   Dizziness 08/03/2012   Essential hypertension 01/09/2008   Gait disorder 08/27/2021   Gastroesophageal reflux disease 01/09/2008   Generalized anxiety disorder 01/09/2008   Genetic testing 06/04/2017   Ms. Upshur underwent genetic counseling and testing for hereditary cancer syndromes on 05/24/2017. Her results were negative for mutations in all 46 genes analyzed by [facility]'s 46-gene Common Hereditary Cancers Panel. Genes analyzed include: APC, ATM, AXIN2, BARD1, BMPR1A, BRCA1, BRCA2, BRIP1, CDH1, CDKN2A, CHEK2, CTNNA1, DICER1, EPCAM, GREM1, HOXB13, KIT, MEN1, MLH1, MSH2, MSH3, MSH6, MUTYH, NBN   History of right mastectomy 01/11/2017   History of  thromboembolism of vein 01/09/2008   History of total hip arthroplasty 04/28/2019   Hyperbilirubinemia 12/16/2021   Hyperlipidemia 01/09/2008   Hypoproteinemia 12/16/2021   Hypothyroidism 01/09/2008   Impaired glucose tolerance 07/28/2011   Insomnia 06/15/2009   Left leg swelling 01/21/2015   Low back pain 04/21/2021   Major depressive disorder 01/09/2008   Malignant neoplasm of upper-inner quadrant of female breast 10/06/2016   Mild dementia 06/02/2022   Neck pain 11/04/2019   Normocytic anemia 01/09/2008   Olecranon bursitis of left elbow 05/05/2015   Osteoarthritis cervical spine 02/25/2010   Pacemaker-medtronic 01/23/2012   DOI 1990s Extraction 1997 Generator replacement 2006, 2013   Peripheral venous insufficiency 03/15/2017   Personal history of venous thrombosis and embolism 01/09/2008   Renal calculus, right 02/09/2020   Right hip pain 07/18/2016   Spondylosis 02/25/2010   Status post total hip replacement, right 11/04/2019   Subungual hematoma of foot 03/15/2017   Trigger finger of left hand 07/14/2020   Trigger finger of right hand 07/14/2020   Urinary incontinence 10/09/2019   Vitamin D deficiency 03/05/2022   Past Surgical History:  Procedure Laterality Date   BREAST BIOPSY Right 09/20/2016   malignant   ERCP W/ SPHICTEROTOMY     pt denies   HEMORRHOID SURGERY     pt denies   IR URETERAL STENT RIGHT NEW ACCESS W/O SEP NEPHROSTOMY CATH  02/09/2020   MASTECTOMY Right 01/11/2017   MASTECTOMY W/ SENTINEL NODE BIOPSY Right 01/11/2017  Procedure: RIGHT MASTECTOMY WITH SENTINEL LYMPH NODE BIOPSY;  Surgeon: Almond Lint, MD;  Location: [facility] OR;  Service: General;  Laterality: Right;   NEPHROLITHOTOMY Right 02/09/2020   Procedure: NEPHROLITHOTOMY PERCUTANEOUS;  Surgeon: Ihor Gully, MD;  Location: [facility] ORS;  Service: Urology;  Laterality: Right;   PACEMAKER PLACEMENT Left    MedTronic EnPulse [id number] no heartbeat without pacemaker   PERMANENT PACEMAKER GENERATOR CHANGE  N/A 07/16/2012   Procedure: PERMANENT PACEMAKER GENERATOR CHANGE;  Surgeon: Duke Salvia, MD;  Location: [facility] CATH LAB;  Service: Cardiovascular;  Laterality: N/A;   POLYPECTOMY     Uterine   PPM GENERATOR CHANGEOUT N/A 04/10/2022   Procedure: PPM GENERATOR CHANGEOUT;  Surgeon: Duke Salvia, MD;  Location: [facility] INVASIVE CV LAB;  Service: Cardiovascular;  Laterality: N/A;   TOTAL HIP ARTHROPLASTY Right 04/28/2019   Procedure: RIGHT TOTAL HIP ARTHROPLASTY ANTERIOR APPROACH;  Surgeon: Tarry Kos, MD;  Location: [facility] OR;  Service: Orthopedics;  Laterality: Right;    reports that she quit smoking about 52 years ago. Her smoking use included cigarettes. She started smoking about 77 years ago. She has a 5 pack-year smoking history. She has never used smokeless tobacco. She reports current alcohol use. She reports that she does not use drugs. family history includes Breast cancer in her maternal aunt; Breast cancer (age of onset: 17) in her mother; Lung cancer in her paternal grandfather; Parkinson's disease in her brother; Pulmonary embolism in her father; Schizophrenia in her brother; Sudden death in her mother. Allergies  Allergen Reactions   Seroquel [Quetiapine]    Codeine Nausea And Vomiting    Pt is unaware of reaction    Oxycodone Nausea And Vomiting    Pt is unaware of reaction    Current Outpatient Medications on File Prior to Visit  Medication Sig Dispense Refill   acetaminophen (TYLENOL) 325 MG tablet Take 2 tablets (650 mg total) by mouth every 6 (six) hours as needed for mild pain (pain score 1-3) (or Fever >/= 101).     alendronate (FOSAMAX) 70 MG tablet Take 1 tablet (70 mg total) by mouth every 7 (seven) days. Take with a full glass of water on an empty stomach. 12 tablet 3   atenolol (TENORMIN) 50 MG tablet Take 1 tablet (50 mg total) by mouth daily. 90 tablet 3   atorvastatin (LIPITOR) 40 MG tablet Take 1 tablet by mouth daily. 90 tablet 0   cholecalciferol (CHOLECALCIFEROL) 25 MCG  tablet Take 1 tablet (1,000 Units total) by mouth daily.     donepezil (ARICEPT) 10 MG tablet Take 1 tablet by mouth at bedtime. 90 tablet 1   Multiple Minerals-Vitamins (CAL-MAG-ZINC-D PO) Take 1 tablet by mouth in the morning and at bedtime.     No current facility-administered medications on file prior to visit.        ROS:  All others reviewed and negative.  Objective        PE:  BP 120/72 (BP Location: Right Arm, Patient Position: Sitting, Cuff Size: Normal)   Pulse 90   Temp 97.9 F (36.6 C) (Oral)   Ht 5\' 3"  (1.6 m)   Wt 110 lb (49.9 kg)   SpO2 99%   BMI 19.49 kg/m                 Constitutional: Pt appears in NAD               HENT: Head: NCAT.  Right Ear: External ear normal.                 Left Ear: External ear normal.                Eyes: . Pupils are equal, round, and reactive to light. Conjunctivae and EOM are normal               Nose: without d/c or deformity               Neck: Neck supple. Gross normal ROM               Cardiovascular: Normal rate and regular rhythm.                 Pulmonary/Chest: Effort normal and breath sounds without rales or wheezing.                Abd:  Soft, NT, ND, + BS, no organomegaly               Neurological: Pt is alert. At baseline orientation, motor grossly intact               Skin: Skin is warm. No rashes, no other new lesions, LE edema - none               Psychiatric: Pt behavior is normal without agitation   Micro: none  Cardiac tracings I have personally interpreted today:  none  Pertinent Radiological findings (summarize): none   Lab Results  Component Value Date   WBC 7.3 01/29/2024   HGB 11.9 (L) 01/29/2024   HCT 35.0 (L) 01/29/2024   PLT 265.0 01/29/2024   GLUCOSE 109 (H) 01/29/2024   CHOL 180 04/17/2023   TRIG 78.0 04/17/2023   HDL 89.00 04/17/2023   LDLDIRECT 135.0 02/27/2022   LDLCALC 75 04/17/2023   ALT 20 01/29/2024   AST 19 01/29/2024   NA 140 01/29/2024   K 3.8 01/29/2024   CL  103 01/29/2024   CREATININE 1.12 01/29/2024   BUN 23 01/29/2024   CO2 28 01/29/2024   TSH 56.26 (H) 01/29/2024   INR 1.0 02/09/2020   HGBA1C 5.5 04/17/2023   MICROALBUR 85.8 (H) 04/17/2023   Assessment/Plan:  Danielle [name] is a 88 y.o. White or Caucasian [1] female with  has a past medical history of Acute bronchitis (10/20/2008), Allergic rhinitis (01/09/2008), Alopecia (01/21/2015), Arthritis (06/11/2020), Atrioventricular block, complete (12/16/2009), Blood in urine (02/22/2017), Cardiac pacemaker in situ (01/23/2012), Cervical radiculopathy (07/11/2013), Complete atrioventricular block (12/16/2009), Degenerative joint disease of right hip (12/17/2018), Dizziness (08/03/2012), Essential hypertension (01/09/2008), Gait disorder (08/27/2021), Gastroesophageal reflux disease (01/09/2008), Generalized anxiety disorder (01/09/2008), Genetic testing (06/04/2017), History of right mastectomy (01/11/2017), History of thromboembolism of vein (01/09/2008), History of total hip arthroplasty (04/28/2019), Hyperbilirubinemia (12/16/2021), Hyperlipidemia (01/09/2008), Hypoproteinemia (12/16/2021), Hypothyroidism (01/09/2008), Impaired glucose tolerance (07/28/2011), Insomnia (06/15/2009), Left leg swelling (01/21/2015), Low back pain (04/21/2021), Major depressive disorder (01/09/2008), Malignant neoplasm of upper-inner quadrant of female breast (10/06/2016), Mild dementia (06/02/2022), Neck pain (11/04/2019), Normocytic anemia (01/09/2008), Olecranon bursitis of left elbow (05/05/2015), Osteoarthritis cervical spine (02/25/2010), Pacemaker-medtronic (01/23/2012), Peripheral venous insufficiency (03/15/2017), Personal history of venous thrombosis and embolism (01/09/2008), Renal calculus, right (02/09/2020), Right hip pain (07/18/2016), Spondylosis (02/25/2010), Status post total hip replacement, right (11/04/2019), Subungual hematoma of foot (03/15/2017), Trigger finger of left hand (07/14/2020), Trigger finger  of right hand (07/14/2020), Urinary incontinence (10/09/2019), and Vitamin D deficiency (03/05/2022).  Vitamin D deficiency Last vitamin D Lab Results  Component Value Date   VD25OH 34.39 01/29/2024   Low, to start oral replacement   Mild dementia Also with some behavioral issue but seroquel too sedating - to d/c seroquel  Essential hypertension BP Readings from Last 3 Encounters:  01/29/24 120/72  01/06/24 110/64  10/30/23 128/82   Stable, pt to continue medical treatment tenormiin 50 qd   Urinary tract infection without hematuria Clinically much improved, will repeat ua and cx r/o recurrence  Hypothyroidism Ok for repeat TSH testing,  to f/u any worsening symptoms or concerns  Followup: Return in about 13 weeks (around 04/29/2024).  Oliver Barre, MD 02/02/2024 3:16 PM [facility]

## 2024-01-30 DIAGNOSIS — F411 Generalized anxiety disorder: Secondary | ICD-10-CM | POA: Diagnosis not present

## 2024-01-30 DIAGNOSIS — F02A4 Dementia in other diseases classified elsewhere, mild, with anxiety: Secondary | ICD-10-CM | POA: Diagnosis not present

## 2024-01-30 DIAGNOSIS — N3000 Acute cystitis without hematuria: Secondary | ICD-10-CM | POA: Diagnosis not present

## 2024-01-30 DIAGNOSIS — G9340 Encephalopathy, unspecified: Secondary | ICD-10-CM | POA: Diagnosis not present

## 2024-01-30 DIAGNOSIS — B9620 Unspecified Escherichia coli [E. coli] as the cause of diseases classified elsewhere: Secondary | ICD-10-CM | POA: Diagnosis not present

## 2024-01-30 DIAGNOSIS — E039 Hypothyroidism, unspecified: Secondary | ICD-10-CM | POA: Diagnosis not present

## 2024-01-30 DIAGNOSIS — F02A18 Dementia in other diseases classified elsewhere, mild, with other behavioral disturbance: Secondary | ICD-10-CM | POA: Diagnosis not present

## 2024-01-30 DIAGNOSIS — G4700 Insomnia, unspecified: Secondary | ICD-10-CM | POA: Diagnosis not present

## 2024-01-30 DIAGNOSIS — F02A3 Dementia in other diseases classified elsewhere, mild, with mood disturbance: Secondary | ICD-10-CM | POA: Diagnosis not present

## 2024-01-30 NOTE — Progress Notes (Signed)
 Remote pacemaker transmission.

## 2024-01-30 NOTE — Addendum Note (Signed)
 Addended by: Geralyn Flash D on: 01/30/2024 04:19 PM   Modules accepted: Orders

## 2024-01-31 ENCOUNTER — Other Ambulatory Visit: Payer: Self-pay | Admitting: Internal Medicine

## 2024-01-31 LAB — URINE CULTURE

## 2024-01-31 MED ORDER — CEPHALEXIN 500 MG PO CAPS: 500.0000 mg | ORAL_CAPSULE | Freq: Three times a day (TID) | ORAL | 0 refills | Status: DC

## 2024-02-01 ENCOUNTER — Telehealth: Payer: Self-pay | Admitting: Internal Medicine

## 2024-02-01 DIAGNOSIS — G9340 Encephalopathy, unspecified: Secondary | ICD-10-CM | POA: Diagnosis not present

## 2024-02-01 DIAGNOSIS — F411 Generalized anxiety disorder: Secondary | ICD-10-CM | POA: Diagnosis not present

## 2024-02-01 DIAGNOSIS — B9620 Unspecified Escherichia coli [E. coli] as the cause of diseases classified elsewhere: Secondary | ICD-10-CM | POA: Diagnosis not present

## 2024-02-01 DIAGNOSIS — F02A3 Dementia in other diseases classified elsewhere, mild, with mood disturbance: Secondary | ICD-10-CM | POA: Diagnosis not present

## 2024-02-01 DIAGNOSIS — G4700 Insomnia, unspecified: Secondary | ICD-10-CM | POA: Diagnosis not present

## 2024-02-01 DIAGNOSIS — E039 Hypothyroidism, unspecified: Secondary | ICD-10-CM | POA: Diagnosis not present

## 2024-02-01 DIAGNOSIS — N3000 Acute cystitis without hematuria: Secondary | ICD-10-CM | POA: Diagnosis not present

## 2024-02-01 DIAGNOSIS — F02A18 Dementia in other diseases classified elsewhere, mild, with other behavioral disturbance: Secondary | ICD-10-CM | POA: Diagnosis not present

## 2024-02-01 DIAGNOSIS — F02A4 Dementia in other diseases classified elsewhere, mild, with anxiety: Secondary | ICD-10-CM | POA: Diagnosis not present

## 2024-02-01 NOTE — Telephone Encounter (Signed)
 Called and gave verbals.

## 2024-02-01 NOTE — Telephone Encounter (Signed)
 Copied from CRM [id number]. Topic: General - Other >> Feb 01, 2024  9:14 AM Rodman Pickle T wrote: Reason for CRM: Dorian from pt is calling in regarding  requesting home pt treatment for one to two times per week for 8 weeks    call back number   [phone]

## 2024-02-01 NOTE — Telephone Encounter (Signed)
 Ok for [employer]

## 2024-02-02 ENCOUNTER — Encounter: Payer: Self-pay | Admitting: Internal Medicine

## 2024-02-02 NOTE — Assessment & Plan Note (Signed)
 Last vitamin D Lab Results  Component Value Date   VD25OH 34.39 01/29/2024   Low, to start oral replacement

## 2024-02-02 NOTE — Assessment & Plan Note (Signed)
 Also with some behavioral issue but seroquel too sedating - to d/c seroquel

## 2024-02-02 NOTE — Assessment & Plan Note (Signed)
 Clinically much improved, will repeat ua and cx r/o recurrence

## 2024-02-02 NOTE — Assessment & Plan Note (Signed)
 BP Readings from Last 3 Encounters:  01/29/24 120/72  01/06/24 110/64  10/30/23 128/82   Stable, pt to continue medical treatment tenormiin 50 qd

## 2024-02-02 NOTE — Assessment & Plan Note (Signed)
 Ok for repeat TSH testing,  to f/u any worsening symptoms or concerns

## 2024-02-07 DIAGNOSIS — F02A18 Dementia in other diseases classified elsewhere, mild, with other behavioral disturbance: Secondary | ICD-10-CM | POA: Diagnosis not present

## 2024-02-07 DIAGNOSIS — F02A3 Dementia in other diseases classified elsewhere, mild, with mood disturbance: Secondary | ICD-10-CM | POA: Diagnosis not present

## 2024-02-07 DIAGNOSIS — B9620 Unspecified Escherichia coli [E. coli] as the cause of diseases classified elsewhere: Secondary | ICD-10-CM | POA: Diagnosis not present

## 2024-02-07 DIAGNOSIS — G9340 Encephalopathy, unspecified: Secondary | ICD-10-CM | POA: Diagnosis not present

## 2024-02-07 DIAGNOSIS — N3000 Acute cystitis without hematuria: Secondary | ICD-10-CM | POA: Diagnosis not present

## 2024-02-07 DIAGNOSIS — E039 Hypothyroidism, unspecified: Secondary | ICD-10-CM | POA: Diagnosis not present

## 2024-02-07 DIAGNOSIS — F02A4 Dementia in other diseases classified elsewhere, mild, with anxiety: Secondary | ICD-10-CM | POA: Diagnosis not present

## 2024-02-07 DIAGNOSIS — F411 Generalized anxiety disorder: Secondary | ICD-10-CM | POA: Diagnosis not present

## 2024-02-07 DIAGNOSIS — G4700 Insomnia, unspecified: Secondary | ICD-10-CM | POA: Diagnosis not present

## 2024-02-08 ENCOUNTER — Telehealth: Payer: Self-pay

## 2024-02-08 NOTE — Telephone Encounter (Signed)
 Copied from CRM [id number]. Topic: Clinical - Home Health Verbal Orders >> Feb 07, 2024  4:44 PM Denese Killings wrote: Caller/Agency: [facility] Callback Number: [phone] Service Requested: Speech Therapy Frequency: 1x week for 6 weeks  Any new concerns about the patient? No

## 2024-02-08 NOTE — Telephone Encounter (Signed)
 Ok for [employer]

## 2024-02-11 DIAGNOSIS — F411 Generalized anxiety disorder: Secondary | ICD-10-CM | POA: Diagnosis not present

## 2024-02-11 DIAGNOSIS — B9620 Unspecified Escherichia coli [E. coli] as the cause of diseases classified elsewhere: Secondary | ICD-10-CM | POA: Diagnosis not present

## 2024-02-11 DIAGNOSIS — N3000 Acute cystitis without hematuria: Secondary | ICD-10-CM | POA: Diagnosis not present

## 2024-02-11 DIAGNOSIS — E039 Hypothyroidism, unspecified: Secondary | ICD-10-CM | POA: Diagnosis not present

## 2024-02-11 DIAGNOSIS — F02A18 Dementia in other diseases classified elsewhere, mild, with other behavioral disturbance: Secondary | ICD-10-CM | POA: Diagnosis not present

## 2024-02-11 DIAGNOSIS — G9340 Encephalopathy, unspecified: Secondary | ICD-10-CM | POA: Diagnosis not present

## 2024-02-11 DIAGNOSIS — G4700 Insomnia, unspecified: Secondary | ICD-10-CM | POA: Diagnosis not present

## 2024-02-11 DIAGNOSIS — F02A3 Dementia in other diseases classified elsewhere, mild, with mood disturbance: Secondary | ICD-10-CM | POA: Diagnosis not present

## 2024-02-11 DIAGNOSIS — F02A4 Dementia in other diseases classified elsewhere, mild, with anxiety: Secondary | ICD-10-CM | POA: Diagnosis not present

## 2024-02-11 NOTE — Telephone Encounter (Signed)
 Called and left voicemail giving verbals.

## 2024-02-13 ENCOUNTER — Encounter: Payer: Self-pay | Admitting: Internal Medicine

## 2024-02-13 DIAGNOSIS — M25511 Pain in right shoulder: Secondary | ICD-10-CM

## 2024-02-13 DIAGNOSIS — R222 Localized swelling, mass and lump, trunk: Secondary | ICD-10-CM

## 2024-02-15 DIAGNOSIS — G4700 Insomnia, unspecified: Secondary | ICD-10-CM | POA: Diagnosis not present

## 2024-02-15 DIAGNOSIS — G9340 Encephalopathy, unspecified: Secondary | ICD-10-CM | POA: Diagnosis not present

## 2024-02-15 DIAGNOSIS — F02A3 Dementia in other diseases classified elsewhere, mild, with mood disturbance: Secondary | ICD-10-CM | POA: Diagnosis not present

## 2024-02-15 DIAGNOSIS — N3000 Acute cystitis without hematuria: Secondary | ICD-10-CM | POA: Diagnosis not present

## 2024-02-15 DIAGNOSIS — E039 Hypothyroidism, unspecified: Secondary | ICD-10-CM | POA: Diagnosis not present

## 2024-02-15 DIAGNOSIS — F02A18 Dementia in other diseases classified elsewhere, mild, with other behavioral disturbance: Secondary | ICD-10-CM | POA: Diagnosis not present

## 2024-02-15 DIAGNOSIS — B9620 Unspecified Escherichia coli [E. coli] as the cause of diseases classified elsewhere: Secondary | ICD-10-CM | POA: Diagnosis not present

## 2024-02-15 DIAGNOSIS — F411 Generalized anxiety disorder: Secondary | ICD-10-CM | POA: Diagnosis not present

## 2024-02-15 DIAGNOSIS — F02A4 Dementia in other diseases classified elsewhere, mild, with anxiety: Secondary | ICD-10-CM | POA: Diagnosis not present

## 2024-02-19 ENCOUNTER — Other Ambulatory Visit: Payer: Self-pay | Admitting: Neurology

## 2024-02-20 DIAGNOSIS — E039 Hypothyroidism, unspecified: Secondary | ICD-10-CM | POA: Diagnosis not present

## 2024-02-20 DIAGNOSIS — B9620 Unspecified Escherichia coli [E. coli] as the cause of diseases classified elsewhere: Secondary | ICD-10-CM | POA: Diagnosis not present

## 2024-02-20 DIAGNOSIS — N3000 Acute cystitis without hematuria: Secondary | ICD-10-CM | POA: Diagnosis not present

## 2024-02-20 DIAGNOSIS — F02A18 Dementia in other diseases classified elsewhere, mild, with other behavioral disturbance: Secondary | ICD-10-CM | POA: Diagnosis not present

## 2024-02-20 DIAGNOSIS — G4700 Insomnia, unspecified: Secondary | ICD-10-CM | POA: Diagnosis not present

## 2024-02-20 DIAGNOSIS — F411 Generalized anxiety disorder: Secondary | ICD-10-CM | POA: Diagnosis not present

## 2024-02-20 DIAGNOSIS — F02A4 Dementia in other diseases classified elsewhere, mild, with anxiety: Secondary | ICD-10-CM | POA: Diagnosis not present

## 2024-02-20 DIAGNOSIS — F02A3 Dementia in other diseases classified elsewhere, mild, with mood disturbance: Secondary | ICD-10-CM | POA: Diagnosis not present

## 2024-02-20 DIAGNOSIS — G9340 Encephalopathy, unspecified: Secondary | ICD-10-CM | POA: Diagnosis not present

## 2024-02-22 ENCOUNTER — Telehealth: Payer: Self-pay | Admitting: Internal Medicine

## 2024-02-22 NOTE — Telephone Encounter (Signed)
Ok this is noted 

## 2024-02-22 NOTE — Telephone Encounter (Signed)
 Copied from CRM [id number]. Topic: General - Other >> Feb 21, 2024  4:43 PM [place] J wrote: Reason for CRM: Elijah Birk calling in from [facility] in regards to patient missing appointments.   Attempted to see patient twice last week, patient did not come to the door for either appointments. Patient states that she does not want pysical therapy anymore and does not want services. Patient will most likely be discharged from facility.

## 2024-02-28 ENCOUNTER — Ambulatory Visit: Admitting: Family Medicine

## 2024-02-29 DIAGNOSIS — B9620 Unspecified Escherichia coli [E. coli] as the cause of diseases classified elsewhere: Secondary | ICD-10-CM | POA: Diagnosis not present

## 2024-02-29 DIAGNOSIS — G4700 Insomnia, unspecified: Secondary | ICD-10-CM | POA: Diagnosis not present

## 2024-02-29 DIAGNOSIS — E039 Hypothyroidism, unspecified: Secondary | ICD-10-CM | POA: Diagnosis not present

## 2024-02-29 DIAGNOSIS — G9340 Encephalopathy, unspecified: Secondary | ICD-10-CM | POA: Diagnosis not present

## 2024-02-29 DIAGNOSIS — N3000 Acute cystitis without hematuria: Secondary | ICD-10-CM | POA: Diagnosis not present

## 2024-02-29 DIAGNOSIS — F02A4 Dementia in other diseases classified elsewhere, mild, with anxiety: Secondary | ICD-10-CM | POA: Diagnosis not present

## 2024-02-29 DIAGNOSIS — F02A18 Dementia in other diseases classified elsewhere, mild, with other behavioral disturbance: Secondary | ICD-10-CM | POA: Diagnosis not present

## 2024-02-29 DIAGNOSIS — F02A3 Dementia in other diseases classified elsewhere, mild, with mood disturbance: Secondary | ICD-10-CM | POA: Diagnosis not present

## 2024-02-29 DIAGNOSIS — F411 Generalized anxiety disorder: Secondary | ICD-10-CM | POA: Diagnosis not present

## 2024-03-05 DIAGNOSIS — F02A3 Dementia in other diseases classified elsewhere, mild, with mood disturbance: Secondary | ICD-10-CM | POA: Diagnosis not present

## 2024-03-05 DIAGNOSIS — G9340 Encephalopathy, unspecified: Secondary | ICD-10-CM | POA: Diagnosis not present

## 2024-03-05 DIAGNOSIS — E039 Hypothyroidism, unspecified: Secondary | ICD-10-CM | POA: Diagnosis not present

## 2024-03-05 DIAGNOSIS — F02A4 Dementia in other diseases classified elsewhere, mild, with anxiety: Secondary | ICD-10-CM | POA: Diagnosis not present

## 2024-03-05 DIAGNOSIS — G4700 Insomnia, unspecified: Secondary | ICD-10-CM | POA: Diagnosis not present

## 2024-03-05 DIAGNOSIS — B9620 Unspecified Escherichia coli [E. coli] as the cause of diseases classified elsewhere: Secondary | ICD-10-CM | POA: Diagnosis not present

## 2024-03-05 DIAGNOSIS — F411 Generalized anxiety disorder: Secondary | ICD-10-CM | POA: Diagnosis not present

## 2024-03-05 DIAGNOSIS — F02A18 Dementia in other diseases classified elsewhere, mild, with other behavioral disturbance: Secondary | ICD-10-CM | POA: Diagnosis not present

## 2024-03-05 DIAGNOSIS — N3000 Acute cystitis without hematuria: Secondary | ICD-10-CM | POA: Diagnosis not present

## 2024-03-05 NOTE — Progress Notes (Unsigned)
    [name] D.[facility] [address] [place] [address] Phone: [phone]   Assessment and Plan:     There are no diagnoses linked to this encounter.  ***   Pertinent previous records reviewed include ***    Follow Up: ***     Subjective:   I, Danielle [name], am serving as a Neurosurgeon for Doctor Ulysees Gander  Chief Complaint: right shoulder and chest nodule pain   HPI:   03/06/2024 Patient is a 88 year old female with right shoulder and chest nodule pain. Patient states   Relevant Historical Information: ***  Additional pertinent review of systems negative.   Current Outpatient Medications:    acetaminophen (TYLENOL) 325 MG tablet, Take 2 tablets (650 mg total) by mouth every 6 (six) hours as needed for mild pain (pain score 1-3) (or Fever >/= 101)., Disp: , Rfl:    alendronate (FOSAMAX) 70 MG tablet, Take 1 tablet (70 mg total) by mouth every 7 (seven) days. Take with a full glass of water on an empty stomach., Disp: 12 tablet, Rfl: 3   atenolol (TENORMIN) 50 MG tablet, Take 1 tablet (50 mg total) by mouth daily., Disp: 90 tablet, Rfl: 3   atorvastatin (LIPITOR) 40 MG tablet, Take 1 tablet by mouth daily., Disp: 90 tablet, Rfl: 0   cephALEXin (KEFLEX) 500 MG capsule, Take 1 capsule (500 mg total) by mouth 3 (three) times daily., Disp: 30 capsule, Rfl: 0   cholecalciferol (CHOLECALCIFEROL) 25 MCG tablet, Take 1 tablet (1,000 Units total) by mouth daily., Disp: , Rfl:    donepezil (ARICEPT) 10 MG tablet, Take 1 tablet by mouth at bedtime., Disp: 90 tablet, Rfl: 1   levothyroxine (SYNTHROID) 137 MCG tablet, Take 1 tablet (137 mcg total) by mouth daily before breakfast., Disp: 90 tablet, Rfl: 3   Multiple Minerals-Vitamins (CAL-MAG-ZINC-D PO), Take 1 tablet by mouth in the morning and at bedtime., Disp: , Rfl:    Objective:     There were no vitals filed for this visit.    There is no height or weight on file to calculate  BMI.    Physical Exam:    ***   Electronically signed by:  Marshall Skeeter D.[facility] 7:42 AM 03/05/24

## 2024-03-06 ENCOUNTER — Ambulatory Visit (INDEPENDENT_AMBULATORY_CARE_PROVIDER_SITE_OTHER)

## 2024-03-06 ENCOUNTER — Ambulatory Visit: Admitting: Sports Medicine

## 2024-03-06 ENCOUNTER — Ambulatory Visit

## 2024-03-06 VITALS — BP 110/82 | HR 63 | Ht 63.0 in | Wt 110.0 lb

## 2024-03-06 DIAGNOSIS — Z853 Personal history of malignant neoplasm of breast: Secondary | ICD-10-CM

## 2024-03-06 DIAGNOSIS — M25562 Pain in left knee: Secondary | ICD-10-CM

## 2024-03-06 DIAGNOSIS — R222 Localized swelling, mass and lump, trunk: Secondary | ICD-10-CM

## 2024-03-06 DIAGNOSIS — G8929 Other chronic pain: Secondary | ICD-10-CM

## 2024-03-06 DIAGNOSIS — M19011 Primary osteoarthritis, right shoulder: Secondary | ICD-10-CM

## 2024-03-06 DIAGNOSIS — M25511 Pain in right shoulder: Secondary | ICD-10-CM

## 2024-03-06 DIAGNOSIS — M79605 Pain in left leg: Secondary | ICD-10-CM | POA: Diagnosis not present

## 2024-03-06 DIAGNOSIS — M1712 Unilateral primary osteoarthritis, left knee: Secondary | ICD-10-CM

## 2024-03-06 NOTE — Patient Instructions (Signed)
 Xrays on the way out  Knee HEP  PT referral  General surgery referral  4-6 week follow up

## 2024-03-10 ENCOUNTER — Encounter: Payer: Self-pay | Admitting: Sports Medicine

## 2024-03-11 ENCOUNTER — Encounter: Payer: Self-pay | Admitting: Sports Medicine

## 2024-03-17 ENCOUNTER — Other Ambulatory Visit: Payer: Self-pay | Admitting: General Surgery

## 2024-03-17 DIAGNOSIS — C50911 Malignant neoplasm of unspecified site of right female breast: Secondary | ICD-10-CM | POA: Diagnosis not present

## 2024-03-17 DIAGNOSIS — R222 Localized swelling, mass and lump, trunk: Secondary | ICD-10-CM | POA: Diagnosis not present

## 2024-03-17 DIAGNOSIS — C761 Malignant neoplasm of thorax: Secondary | ICD-10-CM | POA: Diagnosis not present

## 2024-03-17 DIAGNOSIS — C7981 Secondary malignant neoplasm of breast: Secondary | ICD-10-CM | POA: Diagnosis not present

## 2024-03-17 DIAGNOSIS — R928 Other abnormal and inconclusive findings on diagnostic imaging of breast: Secondary | ICD-10-CM | POA: Diagnosis not present

## 2024-03-18 ENCOUNTER — Telehealth: Payer: Self-pay

## 2024-03-18 NOTE — Telephone Encounter (Signed)
I left a message for the patient to call our office to schedule a tele visit for pre-op 

## 2024-03-18 NOTE — Telephone Encounter (Signed)
   Name: Danielle [name]  DOB: Aug 21, 1936  MRN: [id number]  Primary Cardiologist: Richardo Chandler, MD   Preoperative team, please contact this patient and set up a phone call appointment for further preoperative risk assessment. Please obtain consent and complete medication review. Thank you for your help.  I confirm that guidance regarding antiplatelet and oral anticoagulation therapy has been completed and, if necessary, noted below.  None requested   I also confirmed the patient resides in the state of [place] . As per [facility] [place] [facility] [employer] telemedicine laws, the patient must reside in the state in which the provider is licensed.   Carie Charity, NP 03/18/2024, 9:21 AM [facility]

## 2024-03-18 NOTE — Telephone Encounter (Signed)
   Pre-operative Risk Assessment    Patient Name: Danielle [name]  DOB: [birth year] MRN: [id number]   Date of last office visit: 08/21/2023 Dr. Richardo Chandler, MD Date of next office visit: 03/24/2024 Pacer Check   Request for Surgical Clearance    Procedure:   Breast Cancer surgery  Date of Surgery:  Clearance TBD                                Surgeon: Dr. Lockie Rima, MD Surgeon's Group or Practice Name: [facility] Phone number: [phone] Fax number: [phone]   Type of Clearance Requested:   - Medical    Type of Anesthesia:  General    Additional requests/questions:    [name]   03/18/2024, 8:37 AM

## 2024-03-19 ENCOUNTER — Other Ambulatory Visit: Payer: Self-pay

## 2024-03-19 ENCOUNTER — Ambulatory Visit: Admitting: Physical Therapy

## 2024-03-19 DIAGNOSIS — R2689 Other abnormalities of gait and mobility: Secondary | ICD-10-CM

## 2024-03-19 DIAGNOSIS — G8929 Other chronic pain: Secondary | ICD-10-CM | POA: Diagnosis not present

## 2024-03-19 DIAGNOSIS — M25562 Pain in left knee: Secondary | ICD-10-CM

## 2024-03-19 DIAGNOSIS — M6281 Muscle weakness (generalized): Secondary | ICD-10-CM

## 2024-03-19 NOTE — Telephone Encounter (Signed)
 Left message to call back to review medications for tele preop appt.

## 2024-03-19 NOTE — Therapy (Addendum)
 OUTPATIENT PHYSICAL THERAPY EVALUATION  DISCHARGE   Patient Name: Danielle [name] MRN: [id number] DOB:1936-02-23, 88 y.o., female Today's Date: 03/19/2024   END OF SESSION:  PT End of Session - 03/19/24 1037     Visit Number 1    Number of Visits 9    Date for PT Re-Evaluation 05/14/24    Authorization Type [employer] MCR    PT Start Time 1021    PT Stop Time 1107    PT Time Calculation (min) 46 min    Activity Tolerance Patient tolerated treatment well    Behavior During Therapy [facility] for tasks assessed/performed             Past Medical History:  Diagnosis Date   Acute bronchitis 10/20/2008   Allergic rhinitis 01/09/2008   Alopecia 01/21/2015   Arthritis 06/11/2020   Atrioventricular block, complete 12/16/2009   Blood in urine 02/22/2017   Cardiac pacemaker in situ 01/23/2012   Cervical radiculopathy 07/11/2013   Complete atrioventricular block 12/16/2009   Degenerative joint disease of right hip 12/17/2018   Dizziness 08/03/2012   Essential hypertension 01/09/2008   Gait disorder 08/27/2021   Gastroesophageal reflux disease 01/09/2008   Generalized anxiety disorder 01/09/2008   Genetic testing 06/04/2017   Ms. Sagona underwent genetic counseling and testing for hereditary cancer syndromes on 05/24/2017. Her results were negative for mutations in all 46 genes analyzed by [employer]'s 46-gene Common Hereditary Cancers Panel. Genes analyzed include: APC, ATM, AXIN2, BARD1, BMPR1A, BRCA1, BRCA2, BRIP1, CDH1, CDKN2A, CHEK2, CTNNA1, DICER1, EPCAM, GREM1, HOXB13, KIT, MEN1, MLH1, MSH2, MSH3, MSH6, MUTYH, NBN   History of right mastectomy 01/11/2017   History of thromboembolism of vein 01/09/2008   History of total hip arthroplasty 04/28/2019   Hyperbilirubinemia 12/16/2021   Hyperlipidemia 01/09/2008   Hypoproteinemia 12/16/2021   Hypothyroidism 01/09/2008   Impaired glucose tolerance 07/28/2011   Insomnia 06/15/2009   Left leg swelling 01/21/2015   Low back pain  04/21/2021   Major depressive disorder 01/09/2008   Malignant neoplasm of upper-inner quadrant of female breast 10/06/2016   Mild dementia 06/02/2022   Neck pain 11/04/2019   Normocytic anemia 01/09/2008   Olecranon bursitis of left elbow 05/05/2015   Osteoarthritis cervical spine 02/25/2010   Pacemaker-medtronic 01/23/2012   DOI 1990s Extraction 1997 Generator replacement 2006, 2013   Peripheral venous insufficiency 03/15/2017   Personal history of venous thrombosis and embolism 01/09/2008   Renal calculus, right 02/09/2020   Right hip pain 07/18/2016   Spondylosis 02/25/2010   Status post total hip replacement, right 11/04/2019   Subungual hematoma of foot 03/15/2017   Trigger finger of left hand 07/14/2020   Trigger finger of right hand 07/14/2020   Urinary incontinence 10/09/2019   Vitamin D  deficiency 03/05/2022   Past Surgical History:  Procedure Laterality Date   BREAST BIOPSY Right 09/20/2016   malignant   ERCP W/ SPHICTEROTOMY     pt denies   HEMORRHOID SURGERY     pt denies   IR URETERAL STENT RIGHT NEW ACCESS W/O SEP NEPHROSTOMY CATH  02/09/2020   MASTECTOMY Right 01/11/2017   MASTECTOMY W/ SENTINEL NODE BIOPSY Right 01/11/2017   Procedure: RIGHT MASTECTOMY WITH SENTINEL LYMPH NODE BIOPSY;  Surgeon: Lockie Rima, MD;  Location: [facility] OR;  Service: General;  Laterality: Right;   NEPHROLITHOTOMY Right 02/09/2020   Procedure: NEPHROLITHOTOMY PERCUTANEOUS;  Surgeon: Ottelin, Mark, MD;  Location: [facility] ORS;  Service: Urology;  Laterality: Right;   PACEMAKER PLACEMENT Left    MedTronic EnPulse [id number] no heartbeat without  pacemaker   PERMANENT PACEMAKER GENERATOR CHANGE N/A 07/16/2012   Procedure: PERMANENT PACEMAKER GENERATOR CHANGE;  Surgeon: Verona Goodwill, MD;  Location: [facility] CATH LAB;  Service: Cardiovascular;  Laterality: N/A;   POLYPECTOMY     Uterine   PPM GENERATOR CHANGEOUT N/A 04/10/2022   Procedure: PPM GENERATOR CHANGEOUT;  Surgeon: Verona Goodwill, MD;  Location: [facility]  INVASIVE CV LAB;  Service: Cardiovascular;  Laterality: N/A;   TOTAL HIP ARTHROPLASTY Right 04/28/2019   Procedure: RIGHT TOTAL HIP ARTHROPLASTY ANTERIOR APPROACH;  Surgeon: Wes Hamman, MD;  Location: [facility] OR;  Service: Orthopedics;  Laterality: Right;   Patient Active Problem List   Diagnosis Date Noted   Urinary tract infection without hematuria 01/01/2024   Altered mental status 01/01/2024   Acute metabolic encephalopathy 12/31/2023   Nodule of chest wall 10/30/2023   Encounter for well adult exam with abnormal findings 04/17/2023   Left leg pain 04/17/2023   Bilateral hearing loss 04/17/2023   Mild dementia 06/02/2022   Vitamin D  deficiency 03/05/2022   Accidental overdose 12/16/2021   Hypoproteinemia 12/16/2021   Hyperbilirubinemia 12/16/2021   Gait disorder 08/27/2021   Low back pain 04/21/2021   Trigger finger of left hand 07/14/2020   Trigger finger of right hand 07/14/2020   Arthritis 06/11/2020   Renal calculus, right 02/09/2020   Neck pain 11/04/2019   Status post total hip replacement, right 11/04/2019   Urinary incontinence 10/09/2019   History of total hip arthroplasty 04/28/2019   Degenerative joint disease of right hip 12/17/2018   Peripheral venous insufficiency 03/15/2017   Subungual hematoma of foot 03/15/2017   Blood in urine 02/22/2017   History of right mastectomy 01/11/2017   Malignant neoplasm of upper-inner quadrant of female breast 10/06/2016   Right hip pain 07/18/2016   Olecranon bursitis of left elbow 05/05/2015   Alopecia 01/21/2015   Left leg swelling 01/21/2015   Cervical radiculopathy 07/11/2013   RUQ pain 09/20/2012   Dizziness 08/03/2012   Pacemaker-medtronic 01/23/2012   Cardiac pacemaker in situ 01/23/2012   Impaired glucose tolerance 07/28/2011   Osteoarthritis cervical spine 02/25/2010   Spondylosis 02/25/2010   Complete atrioventricular block (HCC) 12/16/2009   Insomnia 06/15/2009   Hypothyroidism 01/09/2008   Hyperlipidemia  01/09/2008   Normocytic anemia 01/09/2008   Generalized anxiety disorder 01/09/2008   Major depressive disorder 01/09/2008   Essential hypertension 01/09/2008   Allergic rhinitis 01/09/2008   Gastroesophageal reflux disease 01/09/2008   Personal history of venous thrombosis and embolism 01/09/2008   History of thromboembolism of vein 01/09/2008    PCP: Roslyn Coombe, MD  REFERRING PROVIDER: Ulysees Gander, DO  REFERRING DIAG: Chronic pain of left knee; Primary osteoarthritis of left knee  THERAPY DIAG:  Chronic pain of left knee  Muscle weakness (generalized)  Other abnormalities of gait and mobility  Rationale for Evaluation and Treatment: Rehabilitation  ONSET DATE: Chronic 3-4 years, worsened since hospitalization beginning 12/31/2023   SUBJECTIVE:  SUBJECTIVE STATEMENT: Patient's daughter assists with history Patient and daughter report patient has been experiencing left knee pain for 3-4 years. She did have a suspected fall about 8 weeks ago that led to a hospital stay that was mainly for UTI. She was using a walker after her hospitalization back in February, and she feels that she is more steady when using the walker but she doesn't use it much. She doesn't have much trouble when she is sitting, but she knows that it will bother her when she gets up so it feels  like a handicap.   PERTINENT HISTORY: See PMH above  PAIN:  Are you having pain? Yes:  NPRS scale: 0/10 currently, unable to rate pain at worse Pain location: Left knee Pain description: Sharp Aggravating factors: Walking, stairs without handrails,  Relieving factors: Rest  PRECAUTIONS: Fall  RED FLAGS: None   WEIGHT BEARING RESTRICTIONS: No  FALLS:  Has patient fallen in last 6 months? Yes. Number of falls 1, patient's daughter states there was a suspected fall about 8 weeks ago  LIVING ENVIRONMENT: Lives with: lives alone but in the process of transitioning to assisted living Lives in:  House/apartment Stairs: Yes: External: multiple sets of steps steps; handrails on one set of the steps Has following equipment at home: Quad cane small base, Walker - 2 wheeled, and Grab bars  PLOF: Independent  PATIENT GOALS: Improving walking and stairs   OBJECTIVE:  Note: Objective measures were completed at Evaluation unless otherwise noted. PATIENT SURVEYS:  PSFS: 4.3 Walking: 5 Stairs without railings: 2 Getting out of chair: 6  COGNITION: Overall cognitive status: History of cognitive impairments - at baseline   SENSATION: WFL  MUSCLE LENGTH: Not assessed  POSTURE:  Patient demonstrates rounded shoulder posture, slightly crouched standing posture  PALPATION: Not assessed  LOWER EXTREMITY ROM:  Active ROM Right eval Left eval  Hip flexion    Hip extension    Hip abduction    Hip adduction    Hip internal rotation    Hip external rotation    Knee flexion 125 125  Knee extension    Ankle dorsiflexion    Ankle plantarflexion    Ankle inversion    Ankle eversion     (Blank rows = not tested)  LOWER EXTREMITY MMT:  MMT Right eval Left eval  Hip flexion 4 4  Hip extension 3 3  Hip abduction 3 3  Hip adduction    Hip internal rotation    Hip external rotation    Knee flexion 4+ 4+  Knee extension 4+ 4+  Ankle dorsiflexion    Ankle plantarflexion    Ankle inversion    Ankle eversion     (Blank rows = not tested)  FUNCTIONAL TESTS:  30 seconds chair stand test: 6 reps  GAIT: Distance walked: 50 ft Assistive device utilized: patient able to walk without device but did have hand-hold assist by daughter when entering therapy, able to walk without assistance post therapy Level of assistance: SBA Comments: Decreased step length, occasional shuffling gait, slightly antalgic on left                                                                                                               TREATMENT [facility] Adult PT Treatment:                                                 DATE: 03/19/2024 Sit to stand -  trying to perform without UE support as able Standing heel raises with counter support Standing hip abduction with counter support Standing march with counter support  Education provided to patient and daughter on nature of knee arthritis and treatment plan to progress her strength to improve her walking, stair negotiation, and mobility. How her expectations of left knee pain and impairment can lead to a decrease in her mobility, where she has good strength and movement of the left knee so she can continue walking and doing activities around the home. Gradual progression of walking and using rollator in community to improve steadiness and endurance out in community.   PATIENT EDUCATION:  Education details: Exam findings, POC, HEP Person educated: Patient and daughter Education method: Explanation, Demonstration, Tactile cues, Verbal cues, and Handouts Education comprehension: verbalized understanding, returned demonstration, verbal cues required, tactile cues required, and needs further education  HOME EXERCISE PROGRAM: Access Code: [email or web address]    ASSESSMENT: CLINICAL IMPRESSION: Patient is a 88 y.o. female who was seen today for physical therapy evaluation and treatment for chronic left knee pain. Patient's daughter necessary to assist with history and directions in therapy. The knee pain has been present for 3-4 years but recently worsened since her hospitalization due to UTI in February 2025 and at that time they were told the patient may have experiences a suspected fall at home as well. Currently she is having difficulty with her walking and stair negotiation, and patient does live at home alone and this limits her independence. She demonstrates good range of motion of her knee but does exhibit some LE strength deficits bilaterally and also gait and balance impairments that are likely impacting her functional ability.   OBJECTIVE  IMPAIRMENTS: Abnormal gait, decreased activity tolerance, decreased balance, decreased strength, postural dysfunction, and pain.   ACTIVITY LIMITATIONS: standing, stairs, transfers, and locomotion level  PARTICIPATION LIMITATIONS: meal prep, cleaning, shopping, and community activity  PERSONAL FACTORS: Age, Fitness, Past/current experiences, and Time since onset of injury/illness/exacerbation are also affecting patient's functional outcome.   REHAB POTENTIAL: Good  CLINICAL DECISION MAKING: Stable/uncomplicated  EVALUATION COMPLEXITY: Low   GOALS: Goals reviewed with patient? Yes  SHORT TERM GOALS: Target date: 04/16/2024  Patient will be I with initial HEP in order to progress with therapy. Baseline: HEP provided at eval Goal status: INITIAL  2.  Patient will be able to stand from chair without using UE for support and without multiple attempts to indicate improved LE strength and mobility Baseline: patient requires UE for support or multiple attempts to stand from chair Goal status: INITIAL  LONG TERM GOALS: Target date: 05/14/2024  Patient will be I with final HEP to maintain progress from PT. Baseline: HEP provided at eval Goal status: INITIAL  2.  Patient will report PSFS >/= 7 in order to indicate an improvement in her functional ability and independence Baseline: 4.3 Goal status: INITIAL  3.  Patient will demonstrate bilateral knee strength 5/5 MMT in order to improve her transfer ability and stair negotiation Baseline: 4+/5 MMT Goal status: INITIAL  4.  Patient will perform 30 sec stand test >/= 10 reps in order to indicate an improvement in her LE strength and reduce fall risk Baseline: 6 reps Goal status: INITIAL   PLAN: PT FREQUENCY: 1x/week  PT DURATION: 8 weeks  PLANNED INTERVENTIONS: 97164- PT Re-evaluation, 97110-Therapeutic exercises, 97530- Therapeutic activity, 97112- Neuromuscular re-education, 97535- Self Care, 52841- Manual therapy, [phone]- Gait  training, Patient/Family education, Balance training, Stair training, Joint mobilization, Cryotherapy, and Moist heat  PLAN FOR NEXT SESSION: Review HEP and progress PRN, progress LE strengthening, gait training, stair training, balance training    Leah Primus, PT, DPT, LAT, ATC 03/19/24  1:15 PM Phone: [phone] Fax: [phone]    Referring diagnosis? M25.562  Treatment diagnosis? (if different than referring diagnosis) M25.562  What was this (referring dx) caused by? []  Surgery []  Fall [x]  Ongoing issue [x]  Arthritis []  Other: ____________  Laterality: []  Rt [x]  Lt []  Both  Check all possible CPT codes:  *CHOOSE 10 OR LESS*    See Planned Interventions listed in the Plan section of the Evaluation.    PHYSICAL THERAPY DISCHARGE SUMMARY  Visits from Start of Care: 1  Current functional level related to goals / functional outcomes: See above   Remaining deficits: See above   Education / Equipment: HEP   Patient agrees to discharge. Patient goals were not met. Patient is being discharged due to not returning since the last visit.  Leah Primus, PT, DPT, LAT, ATC 04/22/24  4:22 PM Phone: [phone] Fax: [phone]

## 2024-03-19 NOTE — Patient Instructions (Signed)
 Access Code: [facility] URL: [email or web address] Date: 03/19/2024 Prepared by: Leah Primus  Exercises - Sit to Stand  - 1 x daily - 3 sets - 5 reps - Heel Raises with Counter Support  - 1 x daily - 2 sets - 10 reps - Standing Hip Abduction with Counter Support  - 1 x daily - 2 sets - 10 reps - Standing March with Counter Support  - 1 x daily - 2 sets - 20 reps

## 2024-03-19 NOTE — Telephone Encounter (Signed)
 Scheduler Kim T. Has scheduled tele preop appt 03/27/24. Pt just  needs a call from call back to review medications.

## 2024-03-20 ENCOUNTER — Ambulatory Visit

## 2024-03-21 ENCOUNTER — Telehealth: Payer: Self-pay

## 2024-03-21 NOTE — Telephone Encounter (Signed)
  Patient Consent for Virtual Visit        Danielle [name] has provided verbal consent on 03/21/2024 for a virtual visit (video or telephone).   CONSENT FOR VIRTUAL VISIT FOR:  Danielle [name]  By participating in this virtual visit I agree to the following:  I hereby voluntarily request, consent and authorize [facility] and its employed or contracted physicians, physician assistants, nurse practitioners or other licensed health care professionals (the Practitioner), to provide me with telemedicine health care services (the "Services") as deemed necessary by the treating Practitioner. I acknowledge and consent to receive the Services by the Practitioner via telemedicine. I understand that the telemedicine visit will involve communicating with the Practitioner through live audiovisual communication technology and the disclosure of certain medical information by electronic transmission. I acknowledge that I have been given the opportunity to request an in-person assessment or other available alternative prior to the telemedicine visit and am voluntarily participating in the telemedicine visit.  I understand that I have the right to withhold or withdraw my consent to the use of telemedicine in the course of my care at any time, without affecting my right to future care or treatment, and that the Practitioner or I may terminate the telemedicine visit at any time. I understand that I have the right to inspect all information obtained and/or recorded in the course of the telemedicine visit and may receive copies of available information for a reasonable fee.  I understand that some of the potential risks of receiving the Services via telemedicine include:  Delay or interruption in medical evaluation due to technological equipment failure or disruption; Information transmitted may not be sufficient (e.g. poor resolution of images) to allow for appropriate medical decision making by the  Practitioner; and/or  In rare instances, security protocols could fail, causing a breach of personal health information.  Furthermore, I acknowledge that it is my responsibility to provide information about my medical history, conditions and care that is complete and accurate to the best of my ability. I acknowledge that Practitioner's advice, recommendations, and/or decision may be based on factors not within their control, such as incomplete or inaccurate data provided by me or distortions of diagnostic images or specimens that may result from electronic transmissions. I understand that the practice of medicine is not an exact science and that Practitioner makes no warranties or guarantees regarding treatment outcomes. I acknowledge that a copy of this consent can be made available to me via my patient portal [facility] MyChart), or I can request a printed copy by calling the office of [facility].    I understand that my insurance will be billed for this visit.   I have read or had this consent read to me. I understand the contents of this consent, which adequately explains the benefits and risks of the Services being provided via telemedicine.  I have been provided ample opportunity to ask questions regarding this consent and the Services and have had my questions answered to my satisfaction. I give my informed consent for the services to be provided through the use of telemedicine in my medical care

## 2024-03-21 NOTE — Telephone Encounter (Signed)
 Med rec and consent complete.

## 2024-03-21 NOTE — Telephone Encounter (Signed)
 Med

## 2024-03-24 ENCOUNTER — Ambulatory Visit (INDEPENDENT_AMBULATORY_CARE_PROVIDER_SITE_OTHER): Payer: Medicare HMO

## 2024-03-24 DIAGNOSIS — I442 Atrioventricular block, complete: Secondary | ICD-10-CM | POA: Diagnosis not present

## 2024-03-25 ENCOUNTER — Other Ambulatory Visit: Payer: Self-pay | Admitting: General Surgery

## 2024-03-25 ENCOUNTER — Other Ambulatory Visit: Payer: Self-pay | Admitting: Internal Medicine

## 2024-03-25 DIAGNOSIS — C50912 Malignant neoplasm of unspecified site of left female breast: Secondary | ICD-10-CM

## 2024-03-25 DIAGNOSIS — R928 Other abnormal and inconclusive findings on diagnostic imaging of breast: Secondary | ICD-10-CM

## 2024-03-25 DIAGNOSIS — C50911 Malignant neoplasm of unspecified site of right female breast: Secondary | ICD-10-CM

## 2024-03-25 LAB — CUP PACEART REMOTE DEVICE CHECK
Battery Remaining Longevity: 38 mo
Battery Voltage: 2.97 V
Brady Statistic AP VP Percent: 55.1 %
Brady Statistic AP VS Percent: 0 %
Brady Statistic AS VP Percent: 44.84 %
Brady Statistic AS VS Percent: 0.06 %
Brady Statistic RA Percent Paced: 55.11 %
Brady Statistic RV Percent Paced: 99.93 %
Date Time Interrogation Session: 20250504231134
Implantable Lead Connection Status: 753985
Implantable Lead Connection Status: 753985
Implantable Lead Implant Date: 19970117
Implantable Lead Implant Date: 19970117
Implantable Lead Location: 753859
Implantable Lead Location: 753860
Implantable Lead Model: 4068
Implantable Lead Model: 4068
Implantable Pulse Generator Implant Date: 20230522
Lead Channel Impedance Value: 361 Ohm
Lead Channel Impedance Value: 380 Ohm
Lead Channel Impedance Value: 475 Ohm
Lead Channel Impedance Value: 494 Ohm
Lead Channel Pacing Threshold Amplitude: 2 V
Lead Channel Pacing Threshold Pulse Width: 0.4 ms
Lead Channel Sensing Intrinsic Amplitude: 1 mV
Lead Channel Sensing Intrinsic Amplitude: 1 mV
Lead Channel Sensing Intrinsic Amplitude: 10.25 mV
Lead Channel Sensing Intrinsic Amplitude: 10.25 mV
Lead Channel Setting Pacing Amplitude: 2.5 V
Lead Channel Setting Pacing Amplitude: 3.5 V
Lead Channel Setting Pacing Pulse Width: 1.5 ms
Lead Channel Setting Sensing Sensitivity: 5.6 mV
Zone Setting Status: 755011
Zone Setting Status: 755011

## 2024-03-27 ENCOUNTER — Ambulatory Visit: Attending: Internal Medicine | Admitting: Student

## 2024-03-27 DIAGNOSIS — Z01810 Encounter for preprocedural cardiovascular examination: Secondary | ICD-10-CM

## 2024-03-27 NOTE — Progress Notes (Signed)
 Virtual Visit via Telephone Note   Because of Danielle [name]'s co-morbid illnesses, she is at least at moderate risk for complications without adequate follow up.  This format is felt to be most appropriate for this patient at this time.  The patient did not have access to video technology/had technical difficulties with video requiring transitioning to audio format only (telephone).  All issues noted in this document were discussed and addressed.  No physical exam could be performed with this format.  Please refer to the patient's chart for her consent to telehealth for [facility].  Evaluation Performed:  Preoperative cardiovascular risk assessment _____________   Date:  03/27/2024   Patient ID:  Danielle [name], DOB 1936-02-16, MRN [id number] Patient Location:  Home Provider location:   Office  Primary Care Provider:  Roslyn Coombe, MD Primary Cardiologist:  Richardo Chandler, MD  Chief Complaint / Patient Profile   88 y.o. y/o female with a h/o complete heart block s/p PPM, hypertension, hyperlipidemia, hypothyroidism, breast cancer who is pending breast cancer surgery by Dr. Cherlynn Cornfield and presents today for telephonic preoperative cardiovascular risk assessment.  History of Present Illness    Danielle [name] is a 88 y.o. female who presents via audio/video conferencing for a telehealth visit today.  Pt was last seen in cardiology clinic on 08/21/2023.  By Dr. Rodolfo Clan.  At that time BEULA MELIN was stable from a cardiac standpoint.  The patient is now pending procedure as outlined above. Patient's daughter is present and assists with telephone visit. Since her last visit, she is doing well. Patient denies shortness of breath, dyspnea on exertion, lower extremity edema, orthopnea or PND. No chest pain, pressure, or tightness. No palpitations.  She has been taking walks once a week with her daughter. She is also performing orthopedic physical therapy exercises 2-3 times a  week. She does a lot of gardening.   Past Medical History    Past Medical History:  Diagnosis Date   Acute bronchitis 10/20/2008   Allergic rhinitis 01/09/2008   Alopecia 01/21/2015   Arthritis 06/11/2020   Atrioventricular block, complete 12/16/2009   Blood in urine 02/22/2017   Cardiac pacemaker in situ 01/23/2012   Cervical radiculopathy 07/11/2013   Complete atrioventricular block 12/16/2009   Degenerative joint disease of right hip 12/17/2018   Dizziness 08/03/2012   Essential hypertension 01/09/2008   Gait disorder 08/27/2021   Gastroesophageal reflux disease 01/09/2008   Generalized anxiety disorder 01/09/2008   Genetic testing 06/04/2017   Ms. Barrick underwent genetic counseling and testing for hereditary cancer syndromes on 05/24/2017. Her results were negative for mutations in all 46 genes analyzed by [facility]'s 46-gene Common Hereditary Cancers Panel. Genes analyzed include: APC, ATM, AXIN2, BARD1, BMPR1A, BRCA1, BRCA2, BRIP1, CDH1, CDKN2A, CHEK2, CTNNA1, DICER1, EPCAM, GREM1, HOXB13, KIT, MEN1, MLH1, MSH2, MSH3, MSH6, MUTYH, NBN   History of right mastectomy 01/11/2017   History of thromboembolism of vein 01/09/2008   History of total hip arthroplasty 04/28/2019   Hyperbilirubinemia 12/16/2021   Hyperlipidemia 01/09/2008   Hypoproteinemia 12/16/2021   Hypothyroidism 01/09/2008   Impaired glucose tolerance 07/28/2011   Insomnia 06/15/2009   Left leg swelling 01/21/2015   Low back pain 04/21/2021   Major depressive disorder 01/09/2008   Malignant neoplasm of upper-inner quadrant of female breast 10/06/2016   Mild dementia 06/02/2022   Neck pain 11/04/2019   Normocytic anemia 01/09/2008   Olecranon bursitis of left elbow 05/05/2015   Osteoarthritis cervical spine 02/25/2010   Pacemaker-medtronic 01/23/2012  DOI 1990s Extraction 1997 Generator replacement 2006, 2013   Peripheral venous insufficiency 03/15/2017   Personal history of venous thrombosis and embolism  01/09/2008   Renal calculus, right 02/09/2020   Right hip pain 07/18/2016   Spondylosis 02/25/2010   Status post total hip replacement, right 11/04/2019   Subungual hematoma of foot 03/15/2017   Trigger finger of left hand 07/14/2020   Trigger finger of right hand 07/14/2020   Urinary incontinence 10/09/2019   Vitamin D  deficiency 03/05/2022   Past Surgical History:  Procedure Laterality Date   BREAST BIOPSY Right 09/20/2016   malignant   ERCP W/ SPHICTEROTOMY     pt denies   HEMORRHOID SURGERY     pt denies   IR URETERAL STENT RIGHT NEW ACCESS W/O SEP NEPHROSTOMY CATH  02/09/2020   MASTECTOMY Right 01/11/2017   MASTECTOMY W/ SENTINEL NODE BIOPSY Right 01/11/2017   Procedure: RIGHT MASTECTOMY WITH SENTINEL LYMPH NODE BIOPSY;  Surgeon: Lockie Rima, MD;  Location: [facility] OR;  Service: General;  Laterality: Right;   NEPHROLITHOTOMY Right 02/09/2020   Procedure: NEPHROLITHOTOMY PERCUTANEOUS;  Surgeon: Ottelin, Mark, MD;  Location: [facility] ORS;  Service: Urology;  Laterality: Right;   PACEMAKER PLACEMENT Left    MedTronic EnPulse [id number] no heartbeat without pacemaker   PERMANENT PACEMAKER GENERATOR CHANGE N/A 07/16/2012   Procedure: PERMANENT PACEMAKER GENERATOR CHANGE;  Surgeon: Verona Goodwill, MD;  Location: [facility] CATH LAB;  Service: Cardiovascular;  Laterality: N/A;   POLYPECTOMY     Uterine   PPM GENERATOR CHANGEOUT N/A 04/10/2022   Procedure: PPM GENERATOR CHANGEOUT;  Surgeon: Verona Goodwill, MD;  Location: [facility] INVASIVE CV LAB;  Service: Cardiovascular;  Laterality: N/A;   TOTAL HIP ARTHROPLASTY Right 04/28/2019   Procedure: RIGHT TOTAL HIP ARTHROPLASTY ANTERIOR APPROACH;  Surgeon: Wes Hamman, MD;  Location: [facility] OR;  Service: Orthopedics;  Laterality: Right;    Allergies  Allergies  Allergen Reactions   Seroquel  [Quetiapine ]    Codeine Nausea And Vomiting    Pt is unaware of reaction    Oxycodone  Nausea And Vomiting    Pt is unaware of reaction     Home Medications    Prior to  Admission medications   Medication Sig Start Date End Date Taking? Authorizing Provider  acetaminophen  (TYLENOL ) 325 MG tablet Take 2 tablets (650 mg total) by mouth every 6 (six) hours as needed for mild pain (pain score 1-3) (or Fever >/= 101). 01/06/24   Abbe Abate, MD  alendronate  (FOSAMAX ) 70 MG tablet Take 1 tablet (70 mg total) by mouth every 7 (seven) days. Take with a full glass of water on an empty stomach. 12/04/22   Roslyn Coombe, MD  atenolol  (TENORMIN ) 50 MG tablet Take 1 tablet (50 mg total) by mouth daily. 12/04/22   Roslyn Coombe, MD  atorvastatin  (LIPITOR) 40 MG tablet Take 1 tablet by mouth daily. 02/19/24   Camara, Amadou, MD  cephALEXin  (KEFLEX ) 500 MG capsule Take 1 capsule (500 mg total) by mouth 3 (three) times daily. 01/31/24   Roslyn Coombe, MD  cholecalciferol  (CHOLECALCIFEROL ) 25 MCG tablet Take 1 tablet (1,000 Units total) by mouth daily. 01/06/24   Abbe Abate, MD  donepezil  (ARICEPT ) 10 MG tablet Take 1 tablet by mouth at bedtime. 12/21/23   Camara, Amadou, MD  levothyroxine  (SYNTHROID ) 137 MCG tablet Take 1 tablet (137 mcg total) by mouth daily before breakfast. 01/29/24   Roslyn Coombe, MD  Multiple Minerals-Vitamins (CAL-MAG-ZINC-D PO) Take 1 tablet by mouth  in the morning and at bedtime.    [provider]    Physical Exam    Vital Signs:  BRITINEY COLLEN does not have vital signs available for review today.  Given telephonic nature of communication, physical exam is limited. AAOx3. NAD. Normal affect.  Speech and respirations are unlabored.   Assessment & Plan    Primary Cardiologist: Richardo Chandler, MD  Preoperative cardiovascular risk assessment.  Breast cancer surgery by Dr. Cherlynn Cornfield.  Chart reviewed as part of pre-operative protocol coverage. According to the RCRI, patient has a 0.4% risk of MACE. Patient reports activity equivalent to 4.0 METS (walking 1 time a week, orthopedic exercises 2-3 times a week, gardening).   Given past  medical history and time since last visit, based on ACC/AHA guidelines, ALEXYIA MOZO would be at acceptable risk for the planned procedure without further cardiovascular testing.   Patient was advised that if she develops new symptoms prior to surgery to contact our office to arrange a follow-up appointment.  she verbalized understanding.  I will route this recommendation to the requesting party via Epic fax function.  Please call with questions.  Time:   Today, I have spent 5 minutes with the patient with telehealth technology discussing medical history, symptoms, and management plan.     Morey Ar, NP  03/27/2024, 7:58 AM

## 2024-03-28 ENCOUNTER — Other Ambulatory Visit: Payer: Self-pay | Admitting: General Surgery

## 2024-03-28 ENCOUNTER — Ambulatory Visit
Admission: RE | Admit: 2024-03-28 | Discharge: 2024-03-28 | Disposition: A | Discharge status: Home / Self Care | Source: Ambulatory Visit | Attending: General Surgery | Admitting: General Surgery

## 2024-03-28 ENCOUNTER — Other Ambulatory Visit: Payer: Self-pay | Admitting: Urology

## 2024-03-28 DIAGNOSIS — C50912 Malignant neoplasm of unspecified site of left female breast: Secondary | ICD-10-CM

## 2024-03-28 DIAGNOSIS — N6315 Unspecified lump in the right breast, overlapping quadrants: Secondary | ICD-10-CM | POA: Diagnosis not present

## 2024-03-28 DIAGNOSIS — Z853 Personal history of malignant neoplasm of breast: Secondary | ICD-10-CM | POA: Diagnosis not present

## 2024-03-28 DIAGNOSIS — R928 Other abnormal and inconclusive findings on diagnostic imaging of breast: Secondary | ICD-10-CM

## 2024-03-28 DIAGNOSIS — N200 Calculus of kidney: Secondary | ICD-10-CM

## 2024-03-28 DIAGNOSIS — C50911 Malignant neoplasm of unspecified site of right female breast: Secondary | ICD-10-CM

## 2024-04-01 ENCOUNTER — Encounter: Admitting: Physical Therapy

## 2024-04-04 ENCOUNTER — Ambulatory Visit: Payer: Self-pay | Admitting: Cardiovascular Disease

## 2024-04-07 ENCOUNTER — Encounter: Payer: Self-pay | Admitting: Internal Medicine

## 2024-04-07 MED ORDER — ATENOLOL 50 MG PO TABS: 50.0000 mg | ORAL_TABLET | Freq: Every day | ORAL | 3 refills | Status: DC

## 2024-04-07 MED ORDER — LEVOTHYROXINE SODIUM 112 MCG PO TABS: 112.0000 ug | ORAL_TABLET | Freq: Every day | ORAL | 3 refills | Status: DC

## 2024-04-07 MED ORDER — DONEPEZIL HCL 10 MG PO TABS: 10.0000 mg | ORAL_TABLET | Freq: Every day | ORAL | 3 refills | Status: DC

## 2024-04-07 MED ORDER — ATORVASTATIN CALCIUM 40 MG PO TABS: 40.0000 mg | ORAL_TABLET | Freq: Every day | ORAL | 3 refills | Status: DC

## 2024-04-07 MED ORDER — ALENDRONATE SODIUM 70 MG PO TABS: 70.0000 mg | ORAL_TABLET | ORAL | 3 refills | Status: DC

## 2024-04-07 NOTE — H&P (Addendum)
 REFERRING PHYSICIAN:  Ulysees Gander     PROVIDER:  Eppie Hasting, MD   Care Team: Patient Care Team: Roslyn Coombe, MD as PCP - General (Internal Medicine) Eppie Hasting, MD as Consulting Provider (Surgical Oncology) Edison Gore, MD (Orthopedic Surgery) Magrinat, Rozella Cornfield, MD (Hematology and Oncology)    MRN: [id number] DOB: [birth year] DATE OF ENCOUNTER: 03/17/2024   Subjective    Chief Complaint: NEW BREAST CANCER       History of Present Illness: Danielle [name] is a 88 y.o. female who is seen today as an office consultation at the request of Dr. Cleora Daft for evaluation of NEW BREAST CANCER .     Patient is referred for a right chest wall mass in April 2025.   Of note, the patient had right breast cancer in 2018 and underwent right mastectomy with sentinel lymph node biopsy.  Path was T2 N0 with 8 negative lymph nodes.  Margins were negative.  There was lymphovascular invasion.  Patient refused adjuvant chemotherapy.  Dr. Charolett Copes retired.     She had a chest wall recurrence in 2022 that I took off in the office.  For unclear reasons prognostic panel was not sent.  However, she also did not want to go back and see medical oncology.   She was lost to follow-up and did not get any further left mammograms and in fact has not had one since 2020.  She has been seeing someone for some left knee pain and brought up a spot on her right chest wall that was painful.  There was a lump there and Dr. Cleora Daft referred her back to us  to evaluate.   This has been there for at least 6 to 9 months.  It has become painful.  Tylenol  is slightly helpful.  It hurts when she touches it, but does not really hurt when she moves it.  She does not recall where it is in relation to the previous nodule that I removed.  She denies pain anywhere else other than the left knee.  She has had x-rays in this location.  She denies any nausea or vomiting or seizures.  She has been diagnosed  with some mild dementia.  She is accompanied by her daughter who corroborates this information.     Diagnostic mammogram: Left mammogram 10/06/2019 BI-RADS 1   Pathology core needle biopsy: 12/13/2020  Pathology 12/2016 mastectomy 1. Breast, simple mastectomy, Right - INVASIVE DUCTAL CARCINOMA, GRADE III/III, SPANNING 2.3 CM. - DUCTAL CARCINOMA IN SITU, HIGH GRADE, MULTIPLE FOCI. - LYMPHOVASCULAR INVASION IS IDENTIFIED. - PERINEURAL INVASION IS IDENTIFIED. - THERE IS NO EVIDENCE OF CARCINOMA IN 2 OF 2 LYMPH NODES (0/2). - THE SURGICAL RESECTION MARGINS ARE NEGATIVE FOR CARCINOMA. - SEE ONCOLOGY TABLE BELOW. 2. Lymph node, sentinel, biopsy, Right axillary - THERE IS NO EVIDENCE OF CARCINOMA IN 1 OF 1 LYMPH NODE (0/1). 3. Lymph node, sentinel, biopsy, Right axillary - THERE IS NO EVIDENCE OF CARCINOMA IN 1 OF 1 LYMPH NODE (0/1). 4. Lymph node, sentinel, biopsy, Right axillary - THERE IS NO EVIDENCE OF CARCINOMA IN 1 OF 1 LYMPH NODE (0/1). 5. Breast, axillary tail, Additional right - THERE IS NO EVIDENCE OF CARCINOMA IN 3 OF 3 LYMPH NODES (0/3).   Receptors: Estrogen Receptor: 0%, NEGATIVE Progesterone Receptor: 0%, NEGATIVE HER2 - NEGATIVE     Review of Systems: A complete review of systems was obtained from the patient.  I have reviewed this information and discussed as appropriate with the  patient.  See HPI as well for other ROS. ROS -otherwise negative       Medical History: Past Medical History      Past Medical History:  Diagnosis Date   Anxiety     Arthritis     History of cancer     Thyroid  disease          Problem List     Patient Active Problem List  Diagnosis   Recurrent breast cancer, right (CMS/HHS-HCC)   Mass of chest wall, right        Past Surgical History  History reviewed. No pertinent surgical history.      Allergies       Allergies  Allergen Reactions   Quetiapine  Other (See Comments)   Codeine Nausea And Vomiting      Pt is unaware  of reaction   Oxycodone  Nausea And Vomiting and Vomiting      Pt is unaware of reaction        Medications Ordered Prior to Encounter        Current Outpatient Medications on File Prior to Visit  Medication Sig Dispense Refill   acetaminophen  (TYLENOL ) 325 MG tablet Take 650 mg by mouth       alendronate  (FOSAMAX ) 70 MG tablet Take 70 mg by mouth       atenoloL  (TENORMIN ) 50 MG tablet Take 50 mg by mouth once daily       atorvastatin  (LIPITOR) 40 MG tablet Take 1 tablet by mouth once daily       cholecalciferol  (VITAMIN D3) 1000 unit tablet Take 1,000 Units by mouth once daily       donepeziL  (ARICEPT ) 10 MG tablet Take 1 tablet by mouth at bedtime       levothyroxine  (SYNTHROID ) 112 MCG tablet         levothyroxine  (SYNTHROID ) 137 MCG tablet Take 137 mcg by mouth       QUEtiapine  (SEROQUEL ) 25 MG tablet          No current facility-administered medications on file prior to visit.        Family History       Family History  Problem Relation Age of Onset   Breast cancer Mother          Tobacco Use History  Social History       Tobacco Use  Smoking Status Never  Smokeless Tobacco Never        Social History  Social History        Socioeconomic History   Marital status: Widowed  Tobacco Use   Smoking status: Never   Smokeless tobacco: Never  Substance and Sexual Activity   Alcohol use: Never   Drug use: Never    Social Drivers of Acupuncturist Strain: Low Risk  (04/15/2023)    Received from [facility]    Overall Financial Resource Strain (CARDIA)     Difficulty of Paying Living Expenses: Not hard at all  Food Insecurity: Patient Unable To Answer (12/31/2023)    Received from [facility]    Hunger Vital Sign     Worried About Running Out of Food in the Last Year: Patient unable to answer     Ran Out of Food in the Last Year: Patient unable to answer  Transportation Needs: No Transportation Needs (12/31/2023)    Received from [facility] - Transportation     Lack of Transportation (  Medical): No     Lack of Transportation (Non-Medical): No  Physical Activity: Sufficiently Active (04/15/2023)    Received from [facility]    Exercise Vital Sign     Days of Exercise per Week: 3 days     Minutes of Exercise per Session: 50 min  Stress: Stress Concern Present (04/15/2023)    Received from [facility] - Occupational Stress Questionnaire     Feeling of Stress : To some extent  Social Connections: Moderately Isolated (12/31/2023)    Received from [facility]    Social Connection and Isolation Panel [NHANES]     Frequency of Communication with Friends and Family: Three times a week     Frequency of Social Gatherings with Friends and Family: More than three times a week     Attends Religious Services: Never     Database administrator or Organizations: No     Attends Banker Meetings: 1 to 4 times per year     Marital Status: Widowed  Housing Stability: Unknown (03/17/2024)    Housing Stability Vital Sign     Homeless in the Last Year: No        Objective:          Vitals:    03/17/24 1412 03/17/24 1413  BP: 134/73    Pulse: 87    Temp: 36.6 C (97.8 F)    SpO2: 98%    Weight: 52 kg (114 lb 9.6 oz)    Height: 160 cm (5\' 3" )    PainSc:     3  PainLoc:   Breast    Body mass index is 20.3 kg/m.       Gen:  No acute distress.  Well nourished and well groomed.   Neurological: Alert and oriented to person, place, and time. Coordination normal.  Head: Normocephalic and atraumatic.  Eyes: Conjunctivae are normal. Pupils are equal, round, and reactive to light. No scleral icterus.  Neck: Normal range of motion. Neck supple. No tracheal deviation or thyromegaly present.  Cardiovascular: Normal rate, regular rhythm, normal heart sounds and intact distal pulses.  Exam reveals no gallop and no friction rub.  No murmur heard. Breast: Right breast is  surgically absent.  On the right chest wall there is a 2-1/2 to 3 cm lesion that is growing into the skin.  This mass is in the location where I described the previous recurrence at the prior op note.  The skin is red and raised and approximately a centimeter in the center of the mass.  This is tender.  It feels mostly mobile.  Has no cellulitis around it or lymphedema.  The remainder of the chest wall flap feels without nodules.  No lymphadenopathy present in the axilla.  The left breast is ptotic and without masses or nipple retraction.  There is no lymphadenopathy present.  No lymphedema on that side or nipple retraction or nipple discharge. Respiratory: Effort normal.  No respiratory distress. No chest wall tenderness. Breath sounds normal.  No wheezes, rales or rhonchi.  GI: Soft. Bowel sounds are normal. The abdomen is soft and nontender.  There is no rebound and no guarding.  Musculoskeletal: Normal range of motion. Extremities are nontender.  Lymphadenopathy: No cervical, preauricular, postauricular or axillary adenopathy is present Skin: Skin is warm and dry. No rash noted. No diaphoresis. No erythema. No pallor. No clubbing, cyanosis, or edema.   Psychiatric: Normal mood and affect. Behavior  is normal. Judgment and thought content normal.      Labs March 2025 LFTs normal CBC with hematocrit of 35 otherwise normal TSH high at 56 C-Met essentially normal other than glucose of 109 and GFR of 44.6 Urinalysis grossly abnormal   Assessment and Plan:        ICD-10-CM    1. Recurrent breast cancer, right (CMS/HHS-HCC)  C50.911 Mammo diagnostic digital left      Mammo US  breast limited right     2. Other abnormal and inconclusive findings on diagnostic imaging of breast  R92.8 Mammo diagnostic digital left     3. Mass of chest wall, right  R22.2 Mammo US  breast limited right       Patient has an obvious recurrence on the right chest wall in the same site where there was a recurrence in  2022.  Rather than punch biopsy this I will plan to just excise it.  It is prudent to do a left mammogram prior to surgery since this has not been done in 5 years.  If there is anything on that side I would plan to remove it at the same time.  In general, with a recurrence I would do staging studies for triple negative tumor, but the patient has already refused chemotherapy twice and has no suspicious symptoms, so I am not going to delay treatment for that.  Also, the recurrence is symptomatic and appears isolated.   I discussed surgery with the patient and her daughter.  We will plan to do an elliptical incision to incorporate the involved skin.  I reviewed that the primary risk include wound complications such as bleeding, infection, pain, wound breakdown, heart or lung complications, blood clot, other unforeseen issues.  They understand and wish to proceed.   No follow-ups on file.

## 2024-04-08 ENCOUNTER — Encounter (HOSPITAL_COMMUNITY)
Admission: RE | Admit: 2024-04-08 | Discharge: 2024-04-08 | Disposition: A | Discharge status: Home / Self Care | Source: Ambulatory Visit | Attending: General Surgery | Admitting: General Surgery

## 2024-04-08 ENCOUNTER — Encounter: Payer: Self-pay | Admitting: Internal Medicine

## 2024-04-08 ENCOUNTER — Encounter: Admitting: Physical Therapy

## 2024-04-08 ENCOUNTER — Other Ambulatory Visit: Payer: Self-pay

## 2024-04-08 ENCOUNTER — Encounter (HOSPITAL_COMMUNITY): Payer: Self-pay

## 2024-04-08 VITALS — BP 135/89 | HR 74 | Temp 97.8°F | Resp 18 | Ht 64.0 in | Wt 111.5 lb

## 2024-04-08 DIAGNOSIS — I2510 Atherosclerotic heart disease of native coronary artery without angina pectoris: Secondary | ICD-10-CM | POA: Insufficient documentation

## 2024-04-08 DIAGNOSIS — E785 Hyperlipidemia, unspecified: Secondary | ICD-10-CM | POA: Diagnosis not present

## 2024-04-08 DIAGNOSIS — D649 Anemia, unspecified: Secondary | ICD-10-CM | POA: Diagnosis not present

## 2024-04-08 DIAGNOSIS — Z87440 Personal history of urinary (tract) infections: Secondary | ICD-10-CM | POA: Insufficient documentation

## 2024-04-08 DIAGNOSIS — R944 Abnormal results of kidney function studies: Secondary | ICD-10-CM | POA: Insufficient documentation

## 2024-04-08 DIAGNOSIS — Z9011 Acquired absence of right breast and nipple: Secondary | ICD-10-CM | POA: Diagnosis not present

## 2024-04-08 DIAGNOSIS — Z01812 Encounter for preprocedural laboratory examination: Secondary | ICD-10-CM | POA: Diagnosis not present

## 2024-04-08 DIAGNOSIS — F03A0 Unspecified dementia, mild, without behavioral disturbance, psychotic disturbance, mood disturbance, and anxiety: Secondary | ICD-10-CM | POA: Insufficient documentation

## 2024-04-08 DIAGNOSIS — I10 Essential (primary) hypertension: Secondary | ICD-10-CM | POA: Diagnosis not present

## 2024-04-08 DIAGNOSIS — Z950 Presence of cardiac pacemaker: Secondary | ICD-10-CM | POA: Diagnosis not present

## 2024-04-08 DIAGNOSIS — C50811 Malignant neoplasm of overlapping sites of right female breast: Secondary | ICD-10-CM | POA: Insufficient documentation

## 2024-04-08 DIAGNOSIS — L989 Disorder of the skin and subcutaneous tissue, unspecified: Secondary | ICD-10-CM

## 2024-04-08 LAB — CBC
HCT: 34.5 % — ABNORMAL LOW (ref 36.0–46.0)
Hemoglobin: 11.1 g/dL — ABNORMAL LOW (ref 12.0–15.0)
MCH: 30.7 pg (ref 26.0–34.0)
MCHC: 32.2 g/dL (ref 30.0–36.0)
MCV: 95.6 fL (ref 80.0–100.0)
Platelets: 174 10*3/uL (ref 150–400)
RBC: 3.61 MIL/uL — ABNORMAL LOW (ref 3.87–5.11)
RDW: 13.6 % (ref 11.5–15.5)
WBC: 6.7 10*3/uL (ref 4.0–10.5)
nRBC: 0 % (ref 0.0–0.2)

## 2024-04-08 LAB — BASIC METABOLIC PANEL WITH GFR
Anion gap: 9 (ref 5–15)
BUN: 27 mg/dL — ABNORMAL HIGH (ref 8–23)
CO2: 26 mmol/L (ref 22–32)
Calcium: 10.3 mg/dL (ref 8.9–10.3)
Chloride: 104 mmol/L (ref 98–111)
Creatinine, Ser: 1.6 mg/dL — ABNORMAL HIGH (ref 0.44–1.00)
GFR, Estimated: 31 mL/min — ABNORMAL LOW (ref >60)
Glucose, Bld: 85 mg/dL (ref 70–99)
Potassium: 3.8 mmol/L (ref 3.5–5.1)
Sodium: 139 mmol/L (ref 135–145)

## 2024-04-08 NOTE — Progress Notes (Signed)
 PERIOPERATIVE PRESCRIPTION FOR IMPLANTED CARDIAC DEVICE PROGRAMMING  Patient Information: Name:  Danielle [name]  DOB:  07/21/1936  MRN:  [id number]    Planned Procedure:  Right Chest Wall Mass Excision  Surgeon:  Dr. Sheron Dixons  Date of Procedure:  Apr 09, 2024  Cautery will be used.  Position during surgery:  Supine   Please send documentation back to:  Arlin Benes (Fax # [phone])  Device Information:  Clinic EP Physician:  Richardo Chandler, MD   Device Type:  Pacemaker Manufacturer and Phone #:  Medtronic: [id number] Pacemaker Dependent?:  Yes.   Date of Last Device Check:  03/23/2024 Normal Device Function?:  Yes.    Electrophysiologist's Recommendations:  Have magnet available. Provide continuous ECG monitoring when magnet is used or reprogramming is to be performed.  Procedure will likely interfere with device function.  Device should be programmed:  Asynchronous pacing during procedure and returned to normal programming after procedure  Per Device Clinic Standing Orders, Glorianne Largo, RN  9:08 AM 04/08/2024

## 2024-04-08 NOTE — Progress Notes (Signed)
 PCP - Dr. Rosalia Colonel Cardiologist - Dr. Richardo Chandler - last office visit 08/21/2023  PPM/ICD - Medtronic PPM Device Orders - Device orders received and available in Epic.  Rep Notified - Rep notified of pt, surgery, start time, arrival time and device orders on 04/08/24  Chest x-ray - n/a EKG - 12/31/2023 Stress Test - 10/03/2005 ECHO - Denies Cardiac Cath - Denies  Sleep Study - Denies CPAP - n/a  No DM  Last dose of GLP1 agonist- n/a GLP1 instructions: n/a  Blood Thinner Instructions: n/a Aspirin  Instructions: n/a  ERAS Protcol - Clear liquids until 1100 morning of surgery PRE-SURGERY Ensure or G2- n/a  COVID TEST- n/a   Anesthesia review: Yes. Cardiac clearance, Medtronic PPM, HTN.  Patient denies shortness of breath, fever, cough and chest pain at PAT appointment. Pt denies any respiratory illness/infection in the last two months.   All instructions explained to the patient, with a verbal understanding of the material. Patient agrees to go over the instructions while at home for a better understanding. Patient also instructed to self quarantine after being tested for COVID-19. The opportunity to ask questions was provided.

## 2024-04-08 NOTE — Progress Notes (Signed)
 Anesthesia Chart Review:  88 year old female follows cardiology for history of CHB s/p Medtronic PPM, HTN, HLD.  Seen by Morey Ar, NP on 03/27/2024 for preop evaluation.  Per note, "1. Preoperative cardiovascular risk assessment.  Breast cancer surgery by Dr. Cherlynn Cornfield. Chart reviewed as part of pre-operative protocol coverage. According to the RCRI, patient has a 0.4% risk of MACE. Patient reports activity equivalent to 4.0 METS (walking 1 time a week, orthopedic exercises 2-3 times a week, gardening). Given past medical history and time since last visit, based on ACC/AHA guidelines, DEVA RON would be at acceptable risk for the planned procedure without further cardiovascular testing. Patient was advised that if she develops new symptoms prior to surgery to contact our office to arrange a follow-up appointment.  she verbalized understanding."  Other pertinent history includes frequent UTIs, mild dementia, and right breast cancer s/p right mastectomy and sentinel lymph node biopsy in 2018, refused adjuvant chemotherapy.  She had some recurrence in 2022 that was removed by Dr. Cherlynn Cornfield in the office.  She has again developed recurrence on the right chest wall in the same site.  Dr. Cherlynn Cornfield recommended resection.  Preop labs reviewed, creatinine elevated at 1.60, mild anemia hemoglobin 11.1, otherwise unremarkable.  EKG 12/31/2023: Atrial sensed ventricular paced rhythm.  Rate 87.  Perioperative prescription for implanted cardiac device programming per progress note 04/08/2024: Device Information:   Clinic EP Physician:  Richardo Chandler, MD    Device Type:  Pacemaker Manufacturer and Phone #:  Medtronic: [id number] Pacemaker Dependent?:  Yes.   Date of Last Device Check:  03/23/2024 Normal Device Function?:  Yes.     Electrophysiologist's Recommendations:   Have magnet available. Provide continuous ECG monitoring when magnet is used or reprogramming is to be performed.  Procedure  will likely interfere with device function.  Device should be programmed:  Asynchronous pacing during procedure and returned to normal programming after procedure   Edilia Gordon [facility] Short Stay Center/Anesthesiology Phone [phone] 04/08/2024 3:27 PM

## 2024-04-08 NOTE — Anesthesia Preprocedure Evaluation (Addendum)
 Anesthesia Evaluation  Patient identified by MRN, date of birth, ID band Patient awake    Reviewed: Allergy & Precautions, H&P , NPO status , Patient's Chart, lab work & pertinent test results  Airway Mallampati: II  TM Distance: >3 FB Neck ROM: Full    Dental no notable dental hx. (+) Partial Upper, Dental Advisory Given   Pulmonary former smoker   Pulmonary exam normal breath sounds clear to auscultation       Cardiovascular hypertension, Pt. on medications and Pt. on home beta blockers + dysrhythmias + pacemaker  Rhythm:Regular Rate:Normal     Neuro/Psych   Anxiety Depression   Dementia negative neurological ROS     GI/Hepatic Neg liver ROS,GERD  ,,  Endo/Other  Hypothyroidism    Renal/GU negative Renal ROS  negative genitourinary   Musculoskeletal  (+) Arthritis , Osteoarthritis,    Abdominal   Peds  Hematology  (+) Blood dyscrasia, anemia   Anesthesia Other Findings   Reproductive/Obstetrics negative OB ROS                             Anesthesia Physical Anesthesia Plan  ASA: 3  Anesthesia Plan: General   Post-op Pain Management: Tylenol  PO (pre-op )*   Induction: Intravenous  PONV Risk Score and Plan: 4 or greater and Ondansetron , Dexamethasone , Propofol  infusion and TIVA  Airway Management Planned: Oral ETT  Additional Equipment:   Intra-op Plan:   Post-operative Plan: Extubation in OR  Informed Consent: I have reviewed the patients History and Physical, chart, labs and discussed the procedure including the risks, benefits and alternatives for the proposed anesthesia with the patient or authorized representative who has indicated his/her understanding and acceptance.   Patient has DNR.  Discussed DNR with power of attorney and Continue DNR.   Dental advisory given  Plan Discussed with: CRNA  Anesthesia Plan Comments: (PAT note by [name], PA-C: 88 year old  female follows cardiology for history of CHB s/p Medtronic PPM, HTN, HLD.  Seen by Morey Ar, NP on 03/27/2024 for preop evaluation.  Per note, "1. Preoperative cardiovascular risk assessment.  Breast cancer surgery by Dr. Cherlynn Cornfield. Chart reviewed as part of pre-operative protocol coverage. According to the RCRI, patient has a 0.4% risk of MACE. Patient reports activity equivalent to 4.0 METS (walking 1 time a week, orthopedic exercises 2-3 times a week, gardening). Given past medical history and time since last visit, based on ACC/AHA guidelines, Danielle [name] would be at acceptable risk for the planned procedure without further cardiovascular testing. Patient was advised that if she develops new symptoms prior to surgery to contact our office to arrange a follow-up appointment.  she verbalized understanding."  Other pertinent history includes frequent UTIs, mild dementia, and right breast cancer s/p right mastectomy and sentinel lymph node biopsy in 2018, refused adjuvant chemotherapy.  She had some recurrence in 2022 that was removed by Dr. Cherlynn Cornfield in the office.  She has again developed recurrence on the right chest wall in the same site.  Dr. Cherlynn Cornfield recommended resection.  Preop labs reviewed, creatinine elevated at 1.60, mild anemia hemoglobin 11.1, otherwise unremarkable.  EKG 12/31/2023: Atrial sensed ventricular paced rhythm.  Rate 87.  Perioperative prescription for implanted cardiac device programming per progress note 04/08/2024: Device Information:  Clinic EP Physician:  Richardo Chandler, MD   Device Type:  Pacemaker Manufacturer and Phone #:  Medtronic: [phone] Pacemaker Dependent?:  Yes.   Date of Last Device Check:  03/23/2024  Normal Device Function?:  Yes.    Electrophysiologist's Recommendations:   Have magnet available.  Provide continuous ECG monitoring when magnet is used or reprogramming is to be performed.   Procedure [name] likely interfere with device  function.  Device should be programmed:  Asynchronous pacing during procedure and returned to normal programming after procedure  )        Anesthesia Quick Evaluation

## 2024-04-08 NOTE — Pre-Procedure Instructions (Signed)
 Surgical Instructions   Your procedure is scheduled on Apr 09, 2024. Report to [facility] Main Entrance "A" at 12:00 P.M., then check in with the Admitting office. Any questions or running late day of surgery: call [phone]  Questions prior to your surgery date: call [phone], Monday-Friday, 8am-4pm. If you experience any cold or flu symptoms such as cough, fever, chills, shortness of breath, etc. between now and your scheduled surgery, please notify us  at the above number.     Remember:  Do not eat after midnight the night before your surgery   You may drink clear liquids until 11:00 AM the morning of your surgery.   Clear liquids allowed are: Water, Non-Citrus Juices (without pulp), Carbonated Beverages, Clear Tea (no milk, honey, etc.), Black Coffee Only (NO MILK, CREAM OR POWDERED CREAMER of any kind), and Gatorade.    Take these medicines the morning of surgery with A SIP OF WATER: atenolol  (TENORMIN )  atorvastatin  (LIPITOR)  levothyroxine  (SYNTHROID )    May take these medicines IF NEEDED: acetaminophen  (TYLENOL )    One week prior to surgery, STOP taking any Aspirin  (unless otherwise instructed by your surgeon) Aleve, Naproxen, Ibuprofen, Motrin, Advil, Goody's, BC's, all herbal medications, fish oil, and non-prescription vitamins.                     Do NOT Smoke (Tobacco/Vaping) for 24 hours prior to your procedure.  If you use a CPAP at night, you may bring your mask/headgear for your overnight stay.   You will be asked to remove any contacts, glasses, piercing's, hearing aid's, dentures/partials prior to surgery. Please bring cases for these items if needed.    Patients discharged the day of surgery will not be allowed to drive home, and someone needs to stay with them for 24 hours.  SURGICAL WAITING ROOM VISITATION Patients may have no more than 2 support people in the waiting area - these visitors may rotate.   Pre-op  nurse will coordinate an appropriate  time for 1 ADULT support person, who may not rotate, to accompany patient in pre-op .  Children under the age of 59 must have an adult with them who is not the patient and must remain in the main waiting area with an adult.  If the patient needs to stay at the hospital during part of their recovery, the visitor guidelines for inpatient rooms apply.  Please refer to the [facility] website for the visitor guidelines for any additional information.   If you received a COVID test during your pre-op  visit  it is requested that you wear a mask when out in public, stay away from anyone that may not be feeling well and notify your surgeon if you develop symptoms. If you have been in contact with anyone that has tested positive in the last 10 days please notify you surgeon.      Pre-operative CHG Bathing Instructions   You can play a key role in reducing the risk of infection after surgery. Your skin needs to be as free of germs as possible. You can reduce the number of germs on your skin by washing with CHG (chlorhexidine  gluconate) soap before surgery. CHG is an antiseptic soap that kills germs and continues to kill germs even after washing.   DO NOT use if you have an allergy to chlorhexidine /CHG or antibacterial soaps. If your skin becomes reddened or irritated, stop using the CHG and notify one of our RNs at [phone].  TAKE A SHOWER THE NIGHT BEFORE SURGERY AND THE DAY OF SURGERY    Please keep in mind the following:  DO NOT shave, including legs and underarms, 48 hours prior to surgery.   You may shave your face before/day of surgery.  Place clean sheets on your bed the night before surgery Use a clean washcloth (not used since being washed) for each shower. DO NOT sleep with pet's night before surgery.  CHG Shower Instructions:  Wash your face and private area with normal soap. If you choose to wash your hair, wash first with your normal shampoo.  After you use  shampoo/soap, rinse your hair and body thoroughly to remove shampoo/soap residue.  Turn the water OFF and apply half the bottle of CHG soap to a CLEAN washcloth.  Apply CHG soap ONLY FROM YOUR NECK DOWN TO YOUR TOES (washing for 3-5 minutes)  DO NOT use CHG soap on face, private areas, open wounds, or sores.  Pay special attention to the area where your surgery is being performed.  If you are having back surgery, having someone wash your back for you may be helpful. Wait 2 minutes after CHG soap is applied, then you may rinse off the CHG soap.  Pat dry with a clean towel  Put on clean pajamas    Additional instructions for the day of surgery: DO NOT APPLY any lotions, deodorants, cologne, or perfumes.   Do not wear jewelry or makeup Do not wear nail polish, gel polish, artificial nails, or any other type of covering on natural nails (fingers and toes) Do not bring valuables to the hospital. [facility] is not responsible for valuables/personal belongings. Put on clean/comfortable clothes.  Please brush your teeth.  Ask your nurse before applying any prescription medications to the skin.

## 2024-04-09 ENCOUNTER — Ambulatory Visit (HOSPITAL_BASED_OUTPATIENT_CLINIC_OR_DEPARTMENT_OTHER): Admitting: Anesthesiology

## 2024-04-09 ENCOUNTER — Encounter (HOSPITAL_COMMUNITY): Payer: Self-pay | Admitting: General Surgery

## 2024-04-09 ENCOUNTER — Other Ambulatory Visit: Payer: Self-pay

## 2024-04-09 ENCOUNTER — Encounter (HOSPITAL_COMMUNITY)
Admission: RE | Disposition: A | Discharge status: Home / Self Care | Payer: Self-pay | Source: Ambulatory Visit | Attending: General Surgery

## 2024-04-09 ENCOUNTER — Ambulatory Visit (HOSPITAL_COMMUNITY)
Admission: RE | Admit: 2024-04-09 | Discharge: 2024-04-09 | Disposition: A | Discharge status: Home / Self Care | Source: Ambulatory Visit | Attending: General Surgery | Admitting: General Surgery

## 2024-04-09 ENCOUNTER — Ambulatory Visit (HOSPITAL_COMMUNITY): Admitting: Physician Assistant

## 2024-04-09 DIAGNOSIS — I10 Essential (primary) hypertension: Secondary | ICD-10-CM

## 2024-04-09 DIAGNOSIS — C7989 Secondary malignant neoplasm of other specified sites: Secondary | ICD-10-CM | POA: Insufficient documentation

## 2024-04-09 DIAGNOSIS — Z87891 Personal history of nicotine dependence: Secondary | ICD-10-CM | POA: Diagnosis not present

## 2024-04-09 DIAGNOSIS — C50919 Malignant neoplasm of unspecified site of unspecified female breast: Secondary | ICD-10-CM | POA: Diagnosis not present

## 2024-04-09 DIAGNOSIS — E039 Hypothyroidism, unspecified: Secondary | ICD-10-CM

## 2024-04-09 DIAGNOSIS — K219 Gastro-esophageal reflux disease without esophagitis: Secondary | ICD-10-CM | POA: Insufficient documentation

## 2024-04-09 DIAGNOSIS — Z950 Presence of cardiac pacemaker: Secondary | ICD-10-CM | POA: Diagnosis not present

## 2024-04-09 DIAGNOSIS — F03A0 Unspecified dementia, mild, without behavioral disturbance, psychotic disturbance, mood disturbance, and anxiety: Secondary | ICD-10-CM | POA: Insufficient documentation

## 2024-04-09 DIAGNOSIS — R222 Localized swelling, mass and lump, trunk: Secondary | ICD-10-CM | POA: Diagnosis present

## 2024-04-09 DIAGNOSIS — E785 Hyperlipidemia, unspecified: Secondary | ICD-10-CM | POA: Diagnosis not present

## 2024-04-09 DIAGNOSIS — C50911 Malignant neoplasm of unspecified site of right female breast: Secondary | ICD-10-CM | POA: Diagnosis not present

## 2024-04-09 DIAGNOSIS — Z1732 Human epidermal growth factor receptor 2 negative status: Secondary | ICD-10-CM | POA: Insufficient documentation

## 2024-04-09 HISTORY — PX: MASS EXCISION: SHX2000

## 2024-04-09 SURGERY — EXCISION, MASS, CHEST WALL
Anesthesia: General | Laterality: Right

## 2024-04-09 MED ORDER — CHLORHEXIDINE GLUCONATE CLOTH 2 % EX PADS: 6.0000 | MEDICATED_PAD | Freq: Once | CUTANEOUS | Status: DC

## 2024-04-09 MED ORDER — CHLORHEXIDINE GLUCONATE 0.12 % MT SOLN: 15.0000 mL | Freq: Once | OROMUCOSAL | Status: AC

## 2024-04-09 MED ORDER — LIDOCAINE HCL (PF) 1 % IJ SOLN
INTRAMUSCULAR | Status: AC
  Filled 2024-04-09: qty 30

## 2024-04-09 MED ORDER — LIDOCAINE HCL 1 % IJ SOLN
INTRAMUSCULAR | Status: DC | PRN
  Administered 2024-04-09: 20 mL via INTRAMUSCULAR

## 2024-04-09 MED ORDER — ACETAMINOPHEN 500 MG PO TABS: 1000.0000 mg | ORAL_TABLET | ORAL | Status: AC

## 2024-04-09 MED ORDER — LACTATED RINGERS IV SOLN
INTRAVENOUS | Status: DC
Start: 2024-04-09 — End: 2024-04-09

## 2024-04-09 MED ORDER — CHLORHEXIDINE GLUCONATE 0.12 % MT SOLN
OROMUCOSAL | Status: AC
  Administered 2024-04-09: 15 mL via OROMUCOSAL
  Filled 2024-04-09: qty 15

## 2024-04-09 MED ORDER — ACETAMINOPHEN 500 MG PO TABS
ORAL_TABLET | ORAL | Status: AC
  Administered 2024-04-09: 1000 mg via ORAL
  Filled 2024-04-09: qty 2

## 2024-04-09 MED ORDER — TRAMADOL HCL 50 MG PO TABS: 50.0000 mg | ORAL_TABLET | Freq: Three times a day (TID) | ORAL | 0 refills | Status: DC | PRN

## 2024-04-09 MED ORDER — ONDANSETRON HCL 4 MG/2ML IJ SOLN
INTRAMUSCULAR | Status: AC
  Filled 2024-04-09: qty 2

## 2024-04-09 MED ORDER — CEFAZOLIN SODIUM-DEXTROSE 2-4 GM/100ML-% IV SOLN
2.0000 g | INTRAVENOUS | Status: AC
  Administered 2024-04-09: 2 g via INTRAVENOUS

## 2024-04-09 MED ORDER — FENTANYL CITRATE (PF) 250 MCG/5ML IJ SOLN
INTRAMUSCULAR | Status: DC | PRN
  Administered 2024-04-09: 50 ug via INTRAVENOUS

## 2024-04-09 MED ORDER — CEFAZOLIN SODIUM-DEXTROSE 2-4 GM/100ML-% IV SOLN
INTRAVENOUS | Status: AC
  Filled 2024-04-09: qty 100

## 2024-04-09 MED ORDER — PROPOFOL 10 MG/ML IV BOLUS
INTRAVENOUS | Status: DC | PRN
  Administered 2024-04-09: 60 mg via INTRAVENOUS
  Administered 2024-04-09: 125 ug/kg/min via INTRAVENOUS

## 2024-04-09 MED ORDER — SUGAMMADEX SODIUM 200 MG/2ML IV SOLN
INTRAVENOUS | Status: DC | PRN
  Administered 2024-04-09: 200 mg via INTRAVENOUS

## 2024-04-09 MED ORDER — ONDANSETRON HCL 4 MG/2ML IJ SOLN
INTRAMUSCULAR | Status: DC | PRN
  Administered 2024-04-09: 4 mg via INTRAVENOUS

## 2024-04-09 MED ORDER — PHENYLEPHRINE HCL-NACL 20-0.9 MG/250ML-% IV SOLN
INTRAVENOUS | Status: DC | PRN
  Administered 2024-04-09: 25 ug/min via INTRAVENOUS

## 2024-04-09 MED ORDER — ORAL CARE MOUTH RINSE: 15.0000 mL | Freq: Once | OROMUCOSAL | Status: AC

## 2024-04-09 MED ORDER — FENTANYL CITRATE (PF) 250 MCG/5ML IJ SOLN
INTRAMUSCULAR | Status: AC
  Filled 2024-04-09: qty 5

## 2024-04-09 MED ORDER — ROCURONIUM BROMIDE 10 MG/ML (PF) SYRINGE
PREFILLED_SYRINGE | INTRAVENOUS | Status: DC | PRN
  Administered 2024-04-09: 40 mg via INTRAVENOUS
  Administered 2024-04-09: 10 mg via INTRAVENOUS

## 2024-04-09 MED ORDER — BUPIVACAINE-EPINEPHRINE (PF) 0.25% -1:200000 IJ SOLN
INTRAMUSCULAR | Status: AC
  Filled 2024-04-09: qty 30

## 2024-04-09 MED ORDER — LIDOCAINE 2% (20 MG/ML) 5 ML SYRINGE
INTRAMUSCULAR | Status: DC | PRN
  Administered 2024-04-09: 60 mg via INTRAVENOUS

## 2024-04-09 SURGICAL SUPPLY — 37 items
BAG COUNTER SPONGE SURGICOUNT (BAG) ×1 IMPLANT
BENZOIN TINCTURE PRP APPL 2/3 (GAUZE/BANDAGES/DRESSINGS) ×1 IMPLANT
CANISTER SUCTION 3000ML PPV (SUCTIONS) ×1 IMPLANT
CHLORAPREP W/TINT 26 (MISCELLANEOUS) ×1 IMPLANT
COVER SURGICAL LIGHT HANDLE (MISCELLANEOUS) ×1 IMPLANT
DERMABOND ADVANCED .7 DNX12 (GAUZE/BANDAGES/DRESSINGS) IMPLANT
DRAPE CHEST BREAST 15X10 FENES (DRAPES) IMPLANT
DRAPE LAPAROSCOPIC ABDOMINAL (DRAPES) IMPLANT
DRAPE LAPAROTOMY 100X72 PEDS (DRAPES) IMPLANT
DRSG TEGADERM 4X4.75 (GAUZE/BANDAGES/DRESSINGS) IMPLANT
ELECTRODE REM PT RTRN 9FT ADLT (ELECTROSURGICAL) ×1 IMPLANT
GAUZE 4X4 16PLY ~~LOC~~+RFID DBL (SPONGE) ×1 IMPLANT
GAUZE SPONGE 4X4 12PLY STRL (GAUZE/BANDAGES/DRESSINGS) IMPLANT
GLOVE BIO SURGEON STRL SZ 6 (GLOVE) ×1 IMPLANT
GLOVE BIOGEL PI MICRO STRL 5.5 (GLOVE) IMPLANT
GLOVE INDICATOR 6.5 STRL GRN (GLOVE) ×1 IMPLANT
GOWN STRL REUS W/ TWL LRG LVL3 (GOWN DISPOSABLE) ×1 IMPLANT
GOWN STRL REUS W/ TWL XL LVL3 (GOWN DISPOSABLE) ×1 IMPLANT
KIT BASIN OR (CUSTOM PROCEDURE TRAY) ×1 IMPLANT
KIT TURNOVER KIT B (KITS) ×1 IMPLANT
NDL HYPO 25GX1X1/2 BEV (NEEDLE) ×1 IMPLANT
NEEDLE HYPO 25GX1X1/2 BEV (NEEDLE) ×1 IMPLANT
NS IRRIG 1000ML POUR BTL (IV SOLUTION) ×1 IMPLANT
PACK GENERAL/GYN (CUSTOM PROCEDURE TRAY) ×1 IMPLANT
PAD ARMBOARD POSITIONER FOAM (MISCELLANEOUS) ×2 IMPLANT
PENCIL SMOKE EVACUATOR (MISCELLANEOUS) ×1 IMPLANT
SPECIMEN JAR SMALL (MISCELLANEOUS) ×1 IMPLANT
SPONGE T-LAP 4X18 ~~LOC~~+RFID (SPONGE) IMPLANT
STRIP CLOSURE SKIN 1/2X4 (GAUZE/BANDAGES/DRESSINGS) IMPLANT
SUT ETHILON 2 0 PSLX (SUTURE) IMPLANT
SUT MNCRL AB 4-0 PS2 18 (SUTURE) IMPLANT
SUT MON AB 4-0 PC3 18 (SUTURE) ×1 IMPLANT
SUT SILK 2 0 PERMA HAND 18 BK (SUTURE) IMPLANT
SUT VIC AB 3-0 SH 27X BRD (SUTURE) ×1 IMPLANT
SYR CONTROL 10ML LL (SYRINGE) ×1 IMPLANT
TOWEL GREEN STERILE (TOWEL DISPOSABLE) ×1 IMPLANT
TOWEL GREEN STERILE FF (TOWEL DISPOSABLE) ×1 IMPLANT

## 2024-04-09 NOTE — Interval H&P Note (Signed)
 History and Physical Interval Note:  04/09/2024 2:14 PM  Danielle [name]  has presented today for surgery, with the diagnosis of RIGHT CHEST WALL MASS.  The various methods of treatment have been discussed with the patient and family. After consideration of risks, benefits and other options for treatment, the patient has consented to  Procedure(s): EXCISION, MASS, CHEST WALL (Right) as a surgical intervention.  The patient's history has been reviewed, patient examined, no change in status, stable for surgery.  I have reviewed the patient's chart and labs.  Questions were answered to the patient's satisfaction.     Lockie Rima

## 2024-04-09 NOTE — Anesthesia Postprocedure Evaluation (Signed)
 Anesthesia Post Note  Patient: Danielle [name]  Procedure(s) Performed: EXCISION, MASS, CHEST WALL (Right)     Patient location during evaluation: PACU Anesthesia Type: General Level of consciousness: awake Pain management: pain level controlled Vital Signs Assessment: post-procedure vital signs reviewed and stable Respiratory status: spontaneous breathing, nonlabored ventilation and respiratory function stable Cardiovascular status: blood pressure returned to baseline and stable Postop Assessment: no apparent nausea or vomiting Anesthetic complications: no  No notable events documented.  Last Vitals:  Vitals:   04/09/24 1645 04/09/24 1700  BP: (!) 157/91 (!) 160/89  Pulse: 60 60  Resp: 11 12  Temp:  36.9 C  SpO2: 97% 96%    Last Pain:  Vitals:   04/09/24 1645  TempSrc:   PainSc: 0-No pain                 Rudie Sermons,W. EDMOND

## 2024-04-09 NOTE — Op Note (Signed)
 PRE-OPERATIVE DIAGNOSIS: right chest wall mass  POST-OPERATIVE DIAGNOSIS:  Same  PROCEDURE:  Procedure(s): Excision of malignant right chest wall mass  SURGEON:  Surgeon(s): Lockie Rima, MD  ANESTHESIA:   local and general  DRAINS: none   LOCAL MEDICATIONS USED:  BUPIVICAINE  and LIDOCAINE    SPECIMEN:  Source of Specimen:  right chest wall mass  DISPOSITION OF SPECIMEN:  PATHOLOGY  COUNTS:  YES  DICTATION: .Dragon Dictation  PLAN OF CARE: Discharge to home after PACU  PATIENT DISPOSITION:  PACU - hemodynamically stable.  FINDINGS:  firm mobile 3 cm mass right chest abuts, but doesn't invade pectoralis muscle.   EBL:  min  PROCEDURE:  Patient was identified in holding area and taken to the operating room where she was placed supine on the operating table. General anesthesia was induced.  Right chest was prepped and draped in sterile fashion.  A timeout was performed according to the surgical safety D checklist.  When all was correct, we continued.  An elliptical incision which incorporated the mass was made.  The subcutaneous tissues were included in this.  The cautery was used to perform the subcutaneous dissection.  The skin was freed up to create advancement flaps.  Fortunately the patient had adequate loose skin to be able to do this.  Hemostasis was achieved with cautery.  The skin was then reapproximated with interrupted 3-0 Vicryl deep dermal sutures and 4-0 Monocryl running subcuticular suture.  Two interrupted 2-0 nylon mattress sutures were placed for retention.  The wound was then cleaned, dried, and dressed with benzoin, Steri-Strips, gauze, and Tegaderm.  Patient was then allowed to emerge from anesthesia and taken to PACU in stable condition.  Needle, sponge, and instrument counts were correct x 2.

## 2024-04-09 NOTE — Anesthesia Procedure Notes (Signed)
 Procedure Name: Intubation Date/Time: 04/09/2024 3:18 PM  Performed by: Pauletta Boroughs, CRNAPre-anesthesia Checklist: Patient identified, Emergency Drugs available, Suction available and Patient being monitored Patient Re-evaluated:Patient Re-evaluated prior to induction Oxygen Delivery Method: Circle System Utilized Preoxygenation: Pre-oxygenation with 100% oxygen Induction Type: IV induction Ventilation: Mask ventilation without difficulty Laryngoscope Size: Glidescope and 3 Grade View: Grade II Tube type: Oral Tube size: 7.0 mm Number of attempts: 3 Airway Equipment and Method: Stylet and Oral airway Placement Confirmation: ETT inserted through vocal cords under direct vision, positive ETCO2 and breath sounds checked- equal and bilateral Tube secured with: Tape Dental Injury: Teeth and Oropharynx as per pre-operative assessment  Future Recommendations: Recommend- induction with short-acting agent, and alternative techniques readily available Comments: Unable to visualize cords with a mac 3 and miller 2.   Grade 2 view with glidescope 3.  Easy mask ventilation throughout.

## 2024-04-09 NOTE — Discharge Instructions (Addendum)
 [facility] Office Phone Number [phone]  BREAST BIOPSY/ PARTIAL MASTECTOMY: POST OP INSTRUCTIONS  Always review your discharge instruction sheet given to you by the facility where your surgery was performed.  IF YOU HAVE DISABILITY OR FAMILY LEAVE FORMS, YOU MUST BRING THEM TO THE OFFICE FOR PROCESSING.  DO NOT GIVE THEM TO YOUR DOCTOR.  Take 2 tylenol  (acetominophen) three times a day for 3 days.  If you still have pain, add ibuprofen with food in between if able to take this (if you have kidney issues or stomach issues, do not take ibuprofen).  If both of those are not enough, add the narcotic pain pill.  If you find you are needing a lot of this overnight after surgery, call the next morning for a refill.    Prescriptions will not be filled after 5pm or on week-ends. Take your usually prescribed medications unless otherwise directed You should eat very light the first 24 hours after surgery, such as soup, crackers, pudding, etc.  Resume your normal diet the day after surgery. Most patients will experience some swelling and bruising in the breast.  Ice packs and a good support bra will help.  Swelling and bruising can take several days to resolve.  It is common to experience some constipation if taking pain medication after surgery.  Increasing fluid intake and taking a stool softener will usually help or prevent this problem from occurring.  A mild laxative (Milk of Magnesia or Miralax ) should be taken according to package directions if there are no bowel movements after 48 hours. Unless discharge instructions indicate otherwise, you may remove your bandages 48 hours after surgery, and you may shower at that time.  You may have steri-strips (small skin tapes) in place directly over the incision.  These strips should be left on the skin at least for for 7-10 days.   It is ok to cover the sutures if desired to avoid discomfort.   ACTIVITIES:  You may resume regular daily  activities (gradually increasing) beginning the next day.  Wearing a good support bra or sports bra (or the breast binder) minimizes pain and swelling.  You may have sexual intercourse when it is comfortable. No heavy lifting for 1-2 weeks (not over around 10 pounds).  You may drive when you no longer are taking prescription pain medication, you can comfortably wear a seatbelt, and you can safely maneuver your car and apply brakes. RETURN TO WORK:  _________ _______________ [name] should see your doctor in the office for a follow-up appointment approximately two weeks after your surgery.  Your doctor's nurse will typically make your follow-up appointment when she calls you with your pathology report.  Expect your pathology report 3-4 business days after your surgery.  You may call to check if you do not hear from us  after three days.   WHEN TO CALL YOUR DOCTOR: Fever over 101.0 Nausea and/or vomiting. Extreme swelling or bruising. Continued bleeding from incision. Increased pain, redness, or drainage from the incision.  The clinic staff is available to answer your questions during regular business hours.  Please don't hesitate to call and ask to speak to one of the nurses for clinical concerns.  If you have a medical emergency, go to the nearest emergency room or call 911.  A surgeon from [facility] is always on call at the hospital.  For further questions, please visit [email or web address]

## 2024-04-09 NOTE — Transfer of Care (Signed)
 Immediate Anesthesia Transfer of Care Note  Patient: Danielle [name]  Procedure(s) Performed: EXCISION, MASS, CHEST WALL (Right)  Patient Location: PACU  Anesthesia Type:General  Level of Consciousness: drowsy and patient cooperative  Airway & Oxygen Therapy: Patient Spontanous Breathing  Post-op Assessment: Report given to RN and Post -op Vital signs reviewed and stable  Post vital signs: Reviewed and stable  Last Vitals:  Vitals Value Taken Time  BP 142/104 04/09/24 1622  Temp    Pulse 60 04/09/24 1627  Resp 11 04/09/24 1627  SpO2 99 % 04/09/24 1627  Vitals shown include unfiled device data.  Last Pain:  Vitals:   04/09/24 1235  TempSrc:   PainSc: 0-No pain         Complications: No notable events documented.

## 2024-04-10 ENCOUNTER — Encounter (HOSPITAL_COMMUNITY): Payer: Self-pay | Admitting: General Surgery

## 2024-04-15 ENCOUNTER — Encounter: Admitting: Physical Therapy

## 2024-04-15 ENCOUNTER — Telehealth: Payer: Self-pay | Admitting: Physical Therapy

## 2024-04-15 NOTE — Telephone Encounter (Signed)
 Attempted to contact patient's daughter due to missed PT appointment. Left voicemail informing them of missed appointment and reminder of next scheduled appointment.   Leah Primus, PT, DPT, LAT, ATC 04/15/24  2:54 PM Phone: [phone] Fax: [phone]

## 2024-04-16 NOTE — Progress Notes (Deleted)
    [name] D.[facility] [address] [place] [address] Phone: [phone]   Assessment and Plan:     There are no diagnoses linked to this encounter.  ***   Pertinent previous records reviewed include ***    Follow Up: ***     Subjective:   I, Britney Captain, am serving as a Neurosurgeon for Doctor Ulysees Gander   Chief Complaint: right shoulder and chest nodule pain    HPI:    03/06/2024 Patient is a 88 year old female with right shoulder and chest nodule pain. Patient states doesn't know how long nodule has been there but maybe 6-9 months that has been growing and painful. Hx of breast cancer.little marble that is hard to the touch. Intermittent  tylenol  that does not help. No pain when moving her arm. Doesn't really bother her until she touches it.    04/17/2024 Patient states   Relevant Historical Information: Dementia, history of breast cancer, hypertension, GERD  Additional pertinent review of systems negative.   Current Outpatient Medications:    acetaminophen  (TYLENOL ) 325 MG tablet, Take 2 tablets (650 mg total) by mouth every 6 (six) hours as needed for mild pain (pain score 1-3) (or Fever >/= 101)., Disp: , Rfl:    alendronate  (FOSAMAX ) 70 MG tablet, Take 1 tablet (70 mg total) by mouth every 7 (seven) days. Take with a full glass of water on an empty stomach., Disp: 12 tablet, Rfl: 3   atenolol  (TENORMIN ) 50 MG tablet, Take 1 tablet (50 mg total) by mouth daily., Disp: 90 tablet, Rfl: 3   atorvastatin  (LIPITOR) 40 MG tablet, Take 1 tablet (40 mg total) by mouth daily., Disp: 90 tablet, Rfl: 3   cholecalciferol  (CHOLECALCIFEROL ) 25 MCG tablet, Take 1 tablet (1,000 Units total) by mouth daily., Disp: , Rfl:    donepezil  (ARICEPT ) 10 MG tablet, Take 1 tablet (10 mg total) by mouth at bedtime., Disp: 90 tablet, Rfl: 3   levothyroxine  (SYNTHROID ) 112 MCG tablet, Take 1 tablet (112 mcg total) by mouth daily., Disp: 90 tablet,  Rfl: 3   Multiple Minerals-Vitamins (CAL-MAG-ZINC-D PO), Take 1 tablet by mouth in the morning and at bedtime., Disp: , Rfl:    traMADol  (ULTRAM ) 50 MG tablet, Take 1 tablet (50 mg total) by mouth every 8 (eight) hours as needed for moderate pain (pain score 4-6) or severe pain (pain score 7-10)., Disp: 10 tablet, Rfl: 0   Objective:     There were no vitals filed for this visit.    There is no height or weight on file to calculate BMI.    Physical Exam:    ***   Electronically signed by:  Marshall Skeeter D.[facility] 7:40 AM 04/16/24

## 2024-04-17 ENCOUNTER — Ambulatory Visit

## 2024-04-17 ENCOUNTER — Ambulatory Visit: Payer: Self-pay | Admitting: General Surgery

## 2024-04-17 ENCOUNTER — Ambulatory Visit: Admitting: Sports Medicine

## 2024-04-18 ENCOUNTER — Encounter: Payer: Self-pay | Admitting: Internal Medicine

## 2024-04-22 ENCOUNTER — Encounter: Admitting: Physical Therapy

## 2024-04-22 ENCOUNTER — Telehealth: Payer: Self-pay | Admitting: Physical Therapy

## 2024-04-22 NOTE — Telephone Encounter (Signed)
 Attempted to contact patient or daughter due to missed PT appointment. Left voicemail informing them that the patient will be discharged from PT due to 3rd missed appointment and would need to follow-up with referring provider for a new PT referral.  Leah Primus, PT, DPT, LAT, ATC 04/22/24  4:21 PM Phone: [phone] Fax: [phone]

## 2024-04-23 ENCOUNTER — Ambulatory Visit: Payer: Medicare HMO

## 2024-04-24 LAB — SURGICAL PATHOLOGY

## 2024-04-28 NOTE — Telephone Encounter (Signed)
 Copied from CRM [id number]. Topic: General - Phone/Fax/Address >> Apr 28, 2024  9:45 AM Juluis Ok wrote: Patient/patient representative is calling for clinic's phone, fax, or address information.  Devra Fontana with [facility] states clinical paperwork was faxed on 06/03. Unable to locate documents. States she will refax documents and request that they be returned as soon as possible due to patient being scheduled to move into facility this week. Callback# [phone]

## 2024-04-28 NOTE — Telephone Encounter (Deleted)
 Copied from CRM [id number]. Topic: General - Phone/Fax/Address >> Apr 28, 2024  9:45 AM Juluis Ok wrote: Patient/patient representative is calling for clinic's phone, fax, or address information.  Devra Fontana with [facility] states clinical paperwork was faxed on 06/03. Unable to locate documents. States she will refax documents and request that they be returned as soon as possible due to patient being scheduled to move into facility this week. Callback# [phone]

## 2024-04-28 NOTE — Telephone Encounter (Signed)
 I already have the forms and they will be faxed this week.

## 2024-04-29 ENCOUNTER — Ambulatory Visit: Payer: Medicare HMO | Admitting: Internal Medicine

## 2024-04-29 ENCOUNTER — Telehealth: Payer: Self-pay | Admitting: Internal Medicine

## 2024-04-29 DIAGNOSIS — Z111 Encounter for screening for respiratory tuberculosis: Secondary | ICD-10-CM | POA: Diagnosis not present

## 2024-04-29 NOTE — Telephone Encounter (Unsigned)
 Copied from CRM [id number]. Topic: Medical Record Request - Provider/Facility Request >> Apr 29, 2024 11:03 AM Chasity T wrote: Reason for CRM: Elisabeth Guild has called in from [facility] [place] regarding patient admission form that was faxed over yesterday at 9:45 am for FL22. She will be moving in this week and needs the forms to be signed and sent back over today. She is requesting for a call back at [phone].

## 2024-04-30 NOTE — Telephone Encounter (Signed)
 Forms have been faxed today.

## 2024-04-30 NOTE — Telephone Encounter (Signed)
 Copied from CRM [id number]. Topic: Medical Record Request - Provider/Facility Request >> Apr 30, 2024  8:44 AM Danae Duncans wrote: Elisabeth Guild( [facility]) called back to check status. States patient is supposed to move into their facility this week but is not able to do so without necessary paperwork. Elisabeth Guild ask could another PCP or PCP nurse fill out the paperwork, as the pt would be needing it to move in this week and will not be able to without the paperwork completed.

## 2024-04-30 NOTE — Telephone Encounter (Signed)
 Copied from CRM [id number]. Topic: General - Other >> Apr 30, 2024 11:05 AM Alyse July wrote: Reason for CRM: Patty([facility] [employer]) would like a call back to clarify FL2 form received. CB# [phone]. Form states Memory care but patient was accessed for assistance living, clarification needed to properly assign patient to the correct area for care.

## 2024-04-30 NOTE — Telephone Encounter (Signed)
 Copied from CRM [id number]. Topic: Medical Record Request - Provider/Facility Request >> Apr 30, 2024 11:11 AM Luane Rumps D wrote: Patient's daughter [name] called in reference received paperwork and stated that it was filled out wrong. It was filled out for memory care when it should be for assisted living. Requesting call back at [phone].

## 2024-04-30 NOTE — Telephone Encounter (Signed)
 Copied from CRM [id number]. Topic: Medical Record Request - Provider/Facility Request >> Apr 29, 2024  4:55 PM Crispin Dolphin wrote: [facility] called back to check status. States patient is supposed to move into their facility this week but is not able to do so without necessary paperwork. Called [facility]. PCP out of office until Monday. Caller would like someone to call her back or call the patient daughters to let them know about why it is delayed and see what can be done. Thank You

## 2024-05-01 DIAGNOSIS — Z0279 Encounter for issue of other medical certificate: Secondary | ICD-10-CM | POA: Diagnosis not present

## 2024-05-01 DIAGNOSIS — Z111 Encounter for screening for respiratory tuberculosis: Secondary | ICD-10-CM | POA: Diagnosis not present

## 2024-05-06 NOTE — Telephone Encounter (Signed)
 Received forms via fax to correct , placed on providers desk.

## 2024-05-13 ENCOUNTER — Ambulatory Visit: Admitting: Dermatology

## 2024-05-13 NOTE — Addendum Note (Signed)
 Addended by: TAWNI DRILLING D on: 05/13/2024 11:07 AM   Modules accepted: Orders

## 2024-05-13 NOTE — Progress Notes (Signed)
 Remote pacemaker transmission.

## 2024-06-18 ENCOUNTER — Ambulatory Visit: Payer: Medicare HMO | Admitting: Neurology

## 2024-06-23 ENCOUNTER — Ambulatory Visit: Payer: Medicare HMO

## 2024-06-23 ENCOUNTER — Ambulatory Visit: Admitting: Dermatology

## 2024-06-23 DIAGNOSIS — I442 Atrioventricular block, complete: Secondary | ICD-10-CM | POA: Diagnosis not present

## 2024-06-23 LAB — CUP PACEART REMOTE DEVICE CHECK
Battery Remaining Longevity: 37 mo
Battery Voltage: 2.97 V
Brady Statistic AP VP Percent: 50.34 %
Brady Statistic AP VS Percent: 0 %
Brady Statistic AS VP Percent: 49.58 %
Brady Statistic AS VS Percent: 0.08 %
Brady Statistic RA Percent Paced: 50.34 %
Brady Statistic RV Percent Paced: 99.92 %
Date Time Interrogation Session: 20250804070655
Implantable Lead Connection Status: 753985
Implantable Lead Connection Status: 753985
Implantable Lead Implant Date: 19970117
Implantable Lead Implant Date: 19970117
Implantable Lead Location: 753859
Implantable Lead Location: 753860
Implantable Lead Model: 4068
Implantable Lead Model: 4068
Implantable Pulse Generator Implant Date: 20230522
Lead Channel Impedance Value: 342 Ohm
Lead Channel Impedance Value: 399 Ohm
Lead Channel Impedance Value: 475 Ohm
Lead Channel Impedance Value: 475 Ohm
Lead Channel Pacing Threshold Amplitude: 2.125 V
Lead Channel Pacing Threshold Pulse Width: 0.4 ms
Lead Channel Sensing Intrinsic Amplitude: 0.875 mV
Lead Channel Sensing Intrinsic Amplitude: 0.875 mV
Lead Channel Sensing Intrinsic Amplitude: 10.25 mV
Lead Channel Sensing Intrinsic Amplitude: 10.25 mV
Lead Channel Setting Pacing Amplitude: 2.5 V
Lead Channel Setting Pacing Amplitude: 3.5 V
Lead Channel Setting Pacing Pulse Width: 1.5 ms
Lead Channel Setting Sensing Sensitivity: 5.6 mV
Zone Setting Status: 755011
Zone Setting Status: 755011

## 2024-06-26 ENCOUNTER — Ambulatory Visit: Admitting: Neurology

## 2024-06-26 ENCOUNTER — Encounter: Payer: Self-pay | Admitting: Neurology

## 2024-08-18 NOTE — Progress Notes (Signed)
 Remote PPM Transmission

## 2024-09-22 ENCOUNTER — Ambulatory Visit (INDEPENDENT_AMBULATORY_CARE_PROVIDER_SITE_OTHER): Payer: Medicare HMO

## 2024-09-22 DIAGNOSIS — I442 Atrioventricular block, complete: Secondary | ICD-10-CM

## 2024-09-23 LAB — CUP PACEART REMOTE DEVICE CHECK
Battery Remaining Longevity: 37 mo
Battery Voltage: 2.96 V
Brady Statistic AP VP Percent: 60.68 %
Brady Statistic AP VS Percent: 0 %
Brady Statistic AS VP Percent: 39.21 %
Brady Statistic AS VS Percent: 0.11 %
Brady Statistic RA Percent Paced: 60.68 %
Brady Statistic RV Percent Paced: 99.88 %
Date Time Interrogation Session: 20251102183409
Implantable Lead Connection Status: 753985
Implantable Lead Connection Status: 753985
Implantable Lead Implant Date: 19970117
Implantable Lead Implant Date: 19970117
Implantable Lead Location: 753859
Implantable Lead Location: 753860
Implantable Lead Model: 4068
Implantable Lead Model: 4068
Implantable Pulse Generator Implant Date: 20230522
Lead Channel Impedance Value: 380 Ohm
Lead Channel Impedance Value: 399 Ohm
Lead Channel Impedance Value: 475 Ohm
Lead Channel Impedance Value: 513 Ohm
Lead Channel Pacing Threshold Amplitude: 2.125 V
Lead Channel Pacing Threshold Pulse Width: 0.4 ms
Lead Channel Sensing Intrinsic Amplitude: 1.25 mV
Lead Channel Sensing Intrinsic Amplitude: 1.25 mV
Lead Channel Sensing Intrinsic Amplitude: 10.25 mV
Lead Channel Sensing Intrinsic Amplitude: 10.25 mV
Lead Channel Setting Pacing Amplitude: 2.5 V
Lead Channel Setting Pacing Amplitude: 3.5 V
Lead Channel Setting Pacing Pulse Width: 1.5 ms
Lead Channel Setting Sensing Sensitivity: 5.6 mV
Zone Setting Status: 755011
Zone Setting Status: 755011

## 2024-09-24 NOTE — Progress Notes (Signed)
 Remote PPM Transmission

## 2024-09-28 ENCOUNTER — Ambulatory Visit: Payer: Self-pay | Admitting: Student in an Organized Health Care Education/Training Program

## 2024-09-29 ENCOUNTER — Ambulatory Visit: Payer: Self-pay | Admitting: Cardiology

## 2024-10-08 ENCOUNTER — Encounter: Admitting: Internal Medicine

## 2024-10-22 ENCOUNTER — Encounter: Admitting: Internal Medicine

## 2024-10-30 ENCOUNTER — Encounter: Admitting: Internal Medicine

## 2024-11-10 ENCOUNTER — Emergency Department (HOSPITAL_COMMUNITY)

## 2024-11-10 ENCOUNTER — Observation Stay (HOSPITAL_COMMUNITY)
Admission: EM | Admit: 2024-11-10 | Discharge: 2024-11-12 | Disposition: A | Discharge status: Hospice | Attending: Emergency Medicine | Admitting: Emergency Medicine

## 2024-11-10 ENCOUNTER — Encounter (HOSPITAL_COMMUNITY): Payer: Self-pay

## 2024-11-10 ENCOUNTER — Other Ambulatory Visit: Payer: Self-pay

## 2024-11-10 DIAGNOSIS — R8290 Unspecified abnormal findings in urine: Secondary | ICD-10-CM | POA: Insufficient documentation

## 2024-11-10 DIAGNOSIS — S0181XA Laceration without foreign body of other part of head, initial encounter: Secondary | ICD-10-CM | POA: Diagnosis not present

## 2024-11-10 DIAGNOSIS — Y92009 Unspecified place in unspecified non-institutional (private) residence as the place of occurrence of the external cause: Secondary | ICD-10-CM

## 2024-11-10 DIAGNOSIS — Z950 Presence of cardiac pacemaker: Secondary | ICD-10-CM | POA: Diagnosis not present

## 2024-11-10 DIAGNOSIS — Z87891 Personal history of nicotine dependence: Secondary | ICD-10-CM | POA: Insufficient documentation

## 2024-11-10 DIAGNOSIS — I442 Atrioventricular block, complete: Secondary | ICD-10-CM | POA: Diagnosis not present

## 2024-11-10 DIAGNOSIS — I10 Essential (primary) hypertension: Secondary | ICD-10-CM | POA: Diagnosis present

## 2024-11-10 DIAGNOSIS — N1832 Chronic kidney disease, stage 3b: Secondary | ICD-10-CM | POA: Diagnosis not present

## 2024-11-10 DIAGNOSIS — R7401 Elevation of levels of liver transaminase levels: Secondary | ICD-10-CM | POA: Insufficient documentation

## 2024-11-10 DIAGNOSIS — E039 Hypothyroidism, unspecified: Secondary | ICD-10-CM | POA: Diagnosis present

## 2024-11-10 DIAGNOSIS — R748 Abnormal levels of other serum enzymes: Secondary | ICD-10-CM | POA: Diagnosis present

## 2024-11-10 DIAGNOSIS — F0390 Unspecified dementia without behavioral disturbance: Secondary | ICD-10-CM | POA: Diagnosis present

## 2024-11-10 DIAGNOSIS — C50911 Malignant neoplasm of unspecified site of right female breast: Secondary | ICD-10-CM | POA: Insufficient documentation

## 2024-11-10 DIAGNOSIS — Z515 Encounter for palliative care: Secondary | ICD-10-CM

## 2024-11-10 DIAGNOSIS — W19XXXA Unspecified fall, initial encounter: Secondary | ICD-10-CM | POA: Diagnosis not present

## 2024-11-10 DIAGNOSIS — N390 Urinary tract infection, site not specified: Secondary | ICD-10-CM | POA: Insufficient documentation

## 2024-11-10 DIAGNOSIS — I129 Hypertensive chronic kidney disease with stage 1 through stage 4 chronic kidney disease, or unspecified chronic kidney disease: Secondary | ICD-10-CM | POA: Insufficient documentation

## 2024-11-10 DIAGNOSIS — E785 Hyperlipidemia, unspecified: Secondary | ICD-10-CM | POA: Diagnosis present

## 2024-11-10 DIAGNOSIS — Z043 Encounter for examination and observation following other accident: Secondary | ICD-10-CM | POA: Diagnosis present

## 2024-11-10 DIAGNOSIS — S01119A Laceration without foreign body of unspecified eyelid and periocular area, initial encounter: Secondary | ICD-10-CM | POA: Diagnosis present

## 2024-11-10 DIAGNOSIS — Q246 Congenital heart block: Secondary | ICD-10-CM | POA: Insufficient documentation

## 2024-11-10 LAB — URINALYSIS, W/ REFLEX TO CULTURE (INFECTION SUSPECTED)
Bilirubin Urine: NEGATIVE
Glucose, UA: NEGATIVE mg/dL
Ketones, ur: NEGATIVE mg/dL
Nitrite: POSITIVE — AB
Protein, ur: 30 mg/dL — AB
Specific Gravity, Urine: 1.013 (ref 1.005–1.030)
pH: 6 (ref 5.0–8.0)

## 2024-11-10 LAB — I-STAT CHEM 8, ED
BUN: 24 mg/dL — ABNORMAL HIGH (ref 8–23)
Calcium, Ion: 1.25 mmol/L (ref 1.15–1.40)
Chloride: 102 mmol/L (ref 98–111)
Creatinine, Ser: 1.6 mg/dL — ABNORMAL HIGH (ref 0.44–1.00)
Glucose, Bld: 111 mg/dL — ABNORMAL HIGH (ref 70–99)
HCT: 34 % — ABNORMAL LOW (ref 36.0–46.0)
Hemoglobin: 11.6 g/dL — ABNORMAL LOW (ref 12.0–15.0)
Potassium: 4.4 mmol/L (ref 3.5–5.1)
Sodium: 139 mmol/L (ref 135–145)
TCO2: 27 mmol/L (ref 22–32)

## 2024-11-10 LAB — COMPREHENSIVE METABOLIC PANEL WITH GFR
ALT: 75 U/L — ABNORMAL HIGH (ref 0–44)
AST: 94 U/L — ABNORMAL HIGH (ref 15–41)
Albumin: 4.7 g/dL (ref 3.5–5.0)
Alkaline Phosphatase: 85 U/L (ref 38–126)
Anion gap: 13 (ref 5–15)
BUN: 21 mg/dL (ref 8–23)
CO2: 25 mmol/L (ref 22–32)
Calcium: 10.3 mg/dL (ref 8.9–10.3)
Chloride: 101 mmol/L (ref 98–111)
Creatinine, Ser: 1.49 mg/dL — ABNORMAL HIGH (ref 0.44–1.00)
GFR, Estimated: 34 mL/min — ABNORMAL LOW
Glucose, Bld: 109 mg/dL — ABNORMAL HIGH (ref 70–99)
Potassium: 4.4 mmol/L (ref 3.5–5.1)
Sodium: 139 mmol/L (ref 135–145)
Total Bilirubin: 1 mg/dL (ref 0.0–1.2)
Total Protein: 7.3 g/dL (ref 6.5–8.1)

## 2024-11-10 LAB — I-STAT CG4 LACTIC ACID, ED: Lactic Acid, Venous: 1.9 mmol/L (ref 0.5–1.9)

## 2024-11-10 LAB — CBC
HCT: 33 % — ABNORMAL LOW (ref 36.0–46.0)
Hemoglobin: 11.5 g/dL — ABNORMAL LOW (ref 12.0–15.0)
MCH: 33.2 pg (ref 26.0–34.0)
MCHC: 34.8 g/dL (ref 30.0–36.0)
MCV: 95.4 fL (ref 80.0–100.0)
Platelets: 175 K/uL (ref 150–400)
RBC: 3.46 MIL/uL — ABNORMAL LOW (ref 3.87–5.11)
RDW: 13.2 % (ref 11.5–15.5)
WBC: 7.6 K/uL (ref 4.0–10.5)
nRBC: 0 % (ref 0.0–0.2)

## 2024-11-10 LAB — PROTIME-INR
INR: 1 (ref 0.8–1.2)
Prothrombin Time: 13.2 s (ref 11.4–15.2)

## 2024-11-10 LAB — SAMPLE TO BLOOD BANK

## 2024-11-10 LAB — CK: Total CK: 640 U/L — ABNORMAL HIGH (ref 38–234)

## 2024-11-10 MED ORDER — LACTATED RINGERS IV SOLN: INTRAVENOUS | Status: DC

## 2024-11-10 MED ORDER — LIDOCAINE HCL (PF) 1 % IJ SOLN
INTRAMUSCULAR | Status: AC
  Administered 2024-11-10: 30 mL
  Filled 2024-11-10: qty 30

## 2024-11-10 MED ORDER — SODIUM CHLORIDE 0.9 % IV BOLUS
1000.0000 mL | Freq: Once | INTRAVENOUS | Status: AC
  Administered 2024-11-10: 1000 mL via INTRAVENOUS

## 2024-11-10 MED ORDER — LEVOTHYROXINE SODIUM 112 MCG PO TABS
112.0000 ug | ORAL_TABLET | Freq: Every day | ORAL | Status: DC
  Administered 2024-11-11: 112 ug via ORAL
  Filled 2024-11-10: qty 1

## 2024-11-10 MED ORDER — ONDANSETRON HCL 4 MG PO TABS: 4.0000 mg | ORAL_TABLET | Freq: Four times a day (QID) | ORAL | Status: DC | PRN

## 2024-11-10 MED ORDER — ATENOLOL 50 MG PO TABS
50.0000 mg | ORAL_TABLET | Freq: Every day | ORAL | Status: DC
  Administered 2024-11-11: 50 mg via ORAL
  Filled 2024-11-10: qty 2

## 2024-11-10 MED ORDER — SODIUM CHLORIDE 0.9 % IV SOLN
2.0000 g | Freq: Once | INTRAVENOUS | Status: AC
  Administered 2024-11-10: 2 g via INTRAVENOUS
  Filled 2024-11-10: qty 20

## 2024-11-10 MED ORDER — SODIUM CHLORIDE 0.9% FLUSH
3.0000 mL | Freq: Two times a day (BID) | INTRAVENOUS | Status: DC
  Administered 2024-11-11 – 2024-11-12 (×4): 3 mL via INTRAVENOUS

## 2024-11-10 MED ORDER — ACETAMINOPHEN 325 MG PO TABS: 650.0000 mg | ORAL_TABLET | Freq: Four times a day (QID) | ORAL | Status: DC | PRN

## 2024-11-10 MED ORDER — ONDANSETRON HCL 4 MG/2ML IJ SOLN: 4.0000 mg | Freq: Four times a day (QID) | INTRAMUSCULAR | Status: DC | PRN

## 2024-11-10 MED ORDER — ACETAMINOPHEN 650 MG RE SUPP: 650.0000 mg | Freq: Four times a day (QID) | RECTAL | Status: DC | PRN

## 2024-11-10 MED ORDER — DONEPEZIL HCL 10 MG PO TABS
10.0000 mg | ORAL_TABLET | Freq: Every day | ORAL | Status: DC
  Filled 2024-11-10: qty 2

## 2024-11-10 MED ORDER — SENNOSIDES-DOCUSATE SODIUM 8.6-50 MG PO TABS: 1.0000 | ORAL_TABLET | Freq: Every evening | ORAL | Status: DC | PRN

## 2024-11-10 NOTE — ED Notes (Signed)
 CCMD called; Pt. Is on the monitor

## 2024-11-10 NOTE — ED Triage Notes (Signed)
 [name] from home with c/o head lac from an unwitnessed fall that happened somewhere between 0800-1600; Per [facility] the pt. Lives alone but has a friend and daughter that comes and checks in on her; the friend found her in the hallway with blood dripping from face; Per [facility] they believe that the pt. Clemens in the bedroom and hit her head on her dresser due to blood being there. Pt. Has a HX of dementia, has a pacemaker, and no blood thinners.   VS per [facility]:  BP: 106/70 HR: 70 SpO2: 98%

## 2024-11-10 NOTE — ED Notes (Signed)
 Pt. Was cleaned; Pt. Brief was very soiled; pt. Was cleaned and washed up; Pt. Put on a purewick.

## 2024-11-10 NOTE — H&P (Signed)
 " History and Physical    Danielle [name] FMW:[id number] DOB: 1936/05/24 DOA: 11/10/2024  PCP: Danielle [name] ORN, MD  Patient coming from: Home  I have personally briefly reviewed patient's old medical records in [facility]  Chief Complaint: Fall at home  HPI: Danielle [name] is a 88 y.o. female with medical history significant for CHB s/p PPM, CKD stage IIIb, HTN, HLD, hypothyroidism, recurrent right breast cancer, dementia who presented to the ED for evaluation after an unwitnessed fall at home with prolonged downtime.  Unable to obtain history from patient due to dementia which is otherwise supplemented by EDP, chart review, and daughter's significant other at bedside.  Patient lives alone but daughter and friend checking on her daily.  She was last seen 8 PM night prior to admission at her usual self.  When they went to check in on her around 3 PM earlier today she was found down on the ground in the kitchen.  They suspect that she initially fell in her bedroom and hit her head on the bedside dresser before walking to the kitchen and then falling again.  Patient is awake and alert but oriented only to self.  She cannot provide any reliable history.  ED Course  Labs/Imaging on admission: I have personally reviewed following labs and imaging studies.  Initial vitals showed BP 153/103, pulse 68, RR 13, temp 97.7 F, SpO2 98% on room air.  Labs showed WBC 7.6, hemoglobin 11.5, platelets 175, sodium 139, potassium 4.4, bicarb 25, BUN 21, creatinine 1.49, serum glucose 109, AST 94, ALT 75, alk phos 85, total bilirubin 1.0, CK6 140, lactic acid 1.9.  Portable chest x-ray negative for active disease.  Pelvic x-ray negative for acute osseous abnormality.  CT head without contrast negative for acute intracranial normality.  CT maxillofacial without contrast negative for acute facial fracture.  CT cervical spine without contrast negative for acute fracture or traumatic  malalignment.  Patient was given 1 L normal saline, IV ceftriaxone .  Left eyebrow/forehead laceration was repaired in the ED with 4 sutures placed.  PPM interrogation ordered.  The hospitalist service was consulted for admission.  Review of Systems: All systems reviewed and are negative except as documented in history of present illness above.   Past Medical History:  Diagnosis Date   Acute bronchitis 10/20/2008   Allergic rhinitis 01/09/2008   Alopecia 01/21/2015   Arthritis 06/11/2020   Atrioventricular block, complete 12/16/2009   Blood in urine 02/22/2017   Cardiac pacemaker in situ 01/23/2012   Cervical radiculopathy 07/11/2013   Complete atrioventricular block 12/16/2009   Degenerative joint disease of right hip 12/17/2018   Dizziness 08/03/2012   Essential hypertension 01/09/2008   Gait disorder 08/27/2021   Gastroesophageal reflux disease 01/09/2008   Generalized anxiety disorder 01/09/2008   Genetic testing 06/04/2017   Danielle [name] underwent genetic counseling and testing for hereditary cancer syndromes on 05/24/2017. Her results were negative for mutations in all 46 genes analyzed by [facility]'s 46-gene Common Hereditary Cancers Panel. Genes analyzed include: APC, ATM, AXIN2, BARD1, BMPR1A, BRCA1, BRCA2, BRIP1, CDH1, CDKN2A, CHEK2, CTNNA1, DICER1, EPCAM, GREM1, HOXB13, KIT, MEN1, MLH1, MSH2, MSH3, MSH6, MUTYH, NBN   History of kidney stones    History of right mastectomy 01/11/2017   History of thromboembolism of vein 01/09/2008   History of total hip arthroplasty 04/28/2019   Hyperbilirubinemia 12/16/2021   Hyperlipidemia 01/09/2008   Hypoproteinemia 12/16/2021   Hypothyroidism 01/09/2008   Impaired glucose tolerance 07/28/2011   Insomnia 06/15/2009  Left leg swelling 01/21/2015   Low back pain 04/21/2021   Major depressive disorder 01/09/2008   Malignant neoplasm of upper-inner quadrant of female breast 10/06/2016   Mild dementia 06/02/2022   Neck pain 11/04/2019    Normocytic anemia 01/09/2008   Olecranon bursitis of left elbow 05/05/2015   Osteoarthritis cervical spine 02/25/2010   Pacemaker-medtronic 01/23/2012   DOI 1990s Extraction 1997 Generator replacement 2006, 2013   Peripheral venous insufficiency 03/15/2017   Personal history of venous thrombosis and embolism 01/09/2008   Right hip pain 07/18/2016   Spondylosis 02/25/2010   Status post total hip replacement, right 11/04/2019   Subungual hematoma of foot 03/15/2017   Trigger finger of left hand 07/14/2020   Trigger finger of right hand 07/14/2020   Urinary incontinence 10/09/2019   Vitamin D  deficiency 03/05/2022    Past Surgical History:  Procedure Laterality Date   BREAST BIOPSY Right 09/20/2016   malignant   ERCP W/ SPHICTEROTOMY     pt denies   HEMORRHOID SURGERY     pt denies   IR URETERAL STENT RIGHT NEW ACCESS W/O SEP NEPHROSTOMY CATH  02/09/2020   MASS EXCISION Right 04/09/2024   Procedure: EXCISION, MASS, CHEST WALL;  Surgeon: Aron Shoulders, MD;  Location: [facility] OR;  Service: General;  Laterality: Right;   MASTECTOMY W/ SENTINEL NODE BIOPSY Right 01/11/2017   Procedure: RIGHT MASTECTOMY WITH SENTINEL LYMPH NODE BIOPSY;  Surgeon: Shoulders Aron, MD;  Location: [facility] OR;  Service: General;  Laterality: Right;   NEPHROLITHOTOMY Right 02/09/2020   Procedure: NEPHROLITHOTOMY PERCUTANEOUS;  Surgeon: Ceil Anes, MD;  Location: [facility] ORS;  Service: Urology;  Laterality: Right;   PACEMAKER PLACEMENT Left    MedTronic EnPulse [id number] no heartbeat without pacemaker   PERMANENT PACEMAKER GENERATOR CHANGE N/A 07/16/2012   Procedure: PERMANENT PACEMAKER GENERATOR CHANGE;  Surgeon: Elspeth JAYSON Sage, MD;  Location: [facility] CATH LAB;  Service: Cardiovascular;  Laterality: N/A;   POLYPECTOMY     Uterine   PPM GENERATOR CHANGEOUT N/A 04/10/2022   Procedure: PPM GENERATOR CHANGEOUT;  Surgeon: Sage Elspeth JAYSON, MD;  Location: [facility] INVASIVE CV LAB;  Service: Cardiovascular;  Laterality: N/A;   TOTAL HIP  ARTHROPLASTY Right 04/28/2019   Procedure: RIGHT TOTAL HIP ARTHROPLASTY ANTERIOR APPROACH;  Surgeon: Jerri Kay HERO, MD;  Location: [facility] OR;  Service: Orthopedics;  Laterality: Right;    Social History: Social History[1]  Allergies[2]  Family History  Problem Relation Age of Onset   Sudden death Mother        d.87s   Breast cancer Mother 8   Pulmonary embolism Father        Possible   Parkinson's disease Brother    Schizophrenia Brother    Lung cancer Paternal Grandfather        history of smoking   Breast cancer Maternal Aunt        d.78s     Prior to Admission medications  Medication Sig Start Date End Date Taking? Authorizing Provider  acetaminophen  (TYLENOL ) 325 MG tablet Take 2 tablets (650 mg total) by mouth every 6 (six) hours as needed for mild pain (pain score 1-3) (or Fever >/= 101). 01/06/24   Danton Reyes DASEN, MD  alendronate  (FOSAMAX ) 70 MG tablet Take 1 tablet (70 mg total) by mouth every 7 (seven) days. Take with a full glass of water  on an empty stomach. 04/07/24   Danielle [name] ORN, MD  atenolol  (TENORMIN ) 50 MG tablet Take 1 tablet (50 mg total) by mouth daily. 04/07/24  Danielle [name] ORN, MD  atorvastatin  (LIPITOR) 40 MG tablet Take 1 tablet (40 mg total) by mouth daily. 04/07/24   Danielle [name] ORN, MD  cholecalciferol  (CHOLECALCIFEROL ) 25 MCG tablet Take 1 tablet (1,000 Units total) by mouth daily. 01/06/24   Danton Reyes DASEN, MD  donepezil  (ARICEPT ) 10 MG tablet Take 1 tablet (10 mg total) by mouth at bedtime. 04/07/24   Danielle [name] ORN, MD  levothyroxine  (SYNTHROID ) 112 MCG tablet Take 1 tablet (112 mcg total) by mouth daily. 04/07/24   Danielle [name] ORN, MD  Multiple Minerals-Vitamins (CAL-MAG-ZINC-D PO) Take 1 tablet by mouth in the morning and at bedtime.    [provider]  traMADol  (ULTRAM ) 50 MG tablet Take 1 tablet (50 mg total) by mouth every 8 (eight) hours as needed for moderate pain (pain score 4-6) or severe pain (pain score 7-10). 04/09/24   Aron Shoulders, MD     Physical Exam: Vitals:   11/10/24 2200 11/10/24 2215 11/10/24 2215 11/10/24 2242  BP: (!) 173/106     Pulse: 76   61  Resp: 19   11  Temp:    99.9 F (37.7 C)  TempSrc:    Oral  SpO2: 94% 100% 100% 100%   Constitutional: Elderly woman resting supine in bed Eyes: EOMI ENMT: Mucous membranes are moist. Posterior pharynx clear of any exudate or lesions.Normal dentition.  Neck: normal, supple, no masses. Respiratory: clear to auscultation anteriorly. Normal respiratory effort. No accessory muscle use.  Cardiovascular: Regular rate and rhythm, no murmurs / rubs / gallops. No extremity edema. 2+ pedal pulses. Abdomen: no tenderness Musculoskeletal: no clubbing / cyanosis. No joint deformity upper and lower extremities. No contractures. Normal muscle tone.  Skin: Left eyebrow laceration s/p repair with 4 sutures in place.  Bruising left upper eyelid.  Dried blood around left eye and left forehead. Neurologic: Sensation intact. Strength equal bilaterally. Psychiatric: Awake, oriented to self only.  EKG: Personally reviewed. AV paced rhythm rate 73.  Assessment/Plan Principal Problem:   Fall at home, initial encounter Active Problems:   Hypothyroidism   Hyperlipidemia   Essential hypertension   Cardiac pacemaker in situ   Dementia (HCC)   Abnormal AST and ALT   Elevated CK   Laceration of eyebrow and forehead, initial encounter   Danielle [name] is a 88 y.o. female with medical history significant for CHB s/p PPM, CKD stage IIIb, HTN, HLD, hypothyroidism, recurrent right breast cancer, dementia who is admitted after fall at home with prolonged downtime.  Assessment and Plan: Unwitnessed fall at home with prolonged downtime Mildly elevated CK: Patient presenting after unwitnessed fall at home with prolonged downtime.  She cannot provide any reliable history regarding what happened due to her dementia.  CK 640 on admission. - IV fluid hydration overnight - Repeat CK level  in a.m. - Fall precautions, PT/OT eval - Interrogate pacemaker  CHB s/p PPM: Remains in paced rhythm on admission.  Interrogate pacemaker.  Abnormal urinalysis: UA with positive nitrates, mild leukocytes, 21-50 RBCs and WBCs, few bacteria on microscopy.  Urine culture in process.  Patient cannot communicate urinary symptoms.  Continue Parikh IV ceftriaxone  for now.  Mild AST/ALT elevation: Likely from volume depletion, potentially early rhabdomyolysis.  Repeat labs in AM.  Hypertension: Resume atenolol  in the a.m.  Hyperlipidemia: Hold statin.  Hypothyroidism: Continue Synthroid .  Left forehead laceration s/p repair in the ED: Will need suture removal 3-5 days from 11/10/2024.  Recurrent right breast cancer: S/p right mastectomy 2018.  More recently  had excision of chest wall mass in May 2025.  Per prior documentation patient and daughter have made decision not to pursue additional treatment.  Dementia: Seems to have advanced dementia.  Continue donepezil .  Fall and delirium precautions.   DVT prophylaxis: SCDs Start: 11/10/24 2331 Code Status:   Code Status: Limited: Do not attempt resuscitation (DNR) -DNR-LIMITED -Do Not Intubate/DNI discussed with primary contacts at bedside Family Communication: Caretaker at bedside Disposition Plan: From home, dispo pending clinical progress Consults called: None Severity of Illness: The appropriate patient status for this patient is OBSERVATION. Observation status is judged to be reasonable and necessary in order to provide the required intensity of service to ensure the patient's safety. The patient's presenting symptoms, physical exam findings, and initial radiographic and laboratory data in the context of their medical condition is felt to place them at decreased risk for further clinical deterioration. Furthermore, it is anticipated that the patient will be medically stable for discharge from the hospital within 2 midnights of  admission.   Jorie Blanch MD [facility]  If 7PM-7AM, please contact night-coverage [email or web address]  11/11/2024, 12:12 AM      [1]  Social History Tobacco Use   Smoking status: Former    Current packs/day: 0.00    Average packs/day: 0.2 packs/day for 25.0 years (5.0 ttl pk-yrs)    Types: Cigarettes    Start date: 11/20/1946    Quit date: 11/21/1971    Years since quitting: 53.0   Smokeless tobacco: Never  Vaping Use   Vaping status: Never Used  Substance Use Topics   Alcohol  use: Yes    Comment: 1-2 drinks per month   Drug use: No  [2]  Allergies Allergen Reactions   Seroquel  [Quetiapine ]     Altered mental state   Codeine Nausea And Vomiting   Oxycodone  Nausea And Vomiting   "

## 2024-11-10 NOTE — ED Provider Notes (Signed)
 " [place] [facility] Provider Note   CSN: [id number] Arrival date & time: 11/10/24  1652     Patient presents with: Fall and Head Laceration   Danielle [name] is a 88 y.o. female.  With a history of [employer] pacemaker, dementia and anemia who presents to the ED after fall.  Patient lives alone and suffered an unwitnessed fall.  She is unable to recall details of her fall.  She was seen on the floor by a friend who was coming into check on her.  Fall must of happened either early this morning or in the afternoon.  Patient seems to have struck her head on the furniture in her bedroom.  No anticoagulation.  Is reporting a headache.  Denies other injuries.  No neck pain chest pain shortness of breath abdominal pain pain in extremities.    Fall  Head Laceration       Prior to Admission medications  Medication Sig Start Date End Date Taking? Authorizing Provider  acetaminophen  (TYLENOL ) 325 MG tablet Take 2 tablets (650 mg total) by mouth every 6 (six) hours as needed for mild pain (pain score 1-3) (or Fever >/= 101). 01/06/24   Danton Reyes DASEN, MD  alendronate  (FOSAMAX ) 70 MG tablet Take 1 tablet (70 mg total) by mouth every 7 (seven) days. Take with a full glass of water  on an empty stomach. 04/07/24   Norleen Lynwood ORN, MD  atenolol  (TENORMIN ) 50 MG tablet Take 1 tablet (50 mg total) by mouth daily. 04/07/24   Norleen Lynwood ORN, MD  atorvastatin  (LIPITOR) 40 MG tablet Take 1 tablet (40 mg total) by mouth daily. 04/07/24   Norleen Lynwood ORN, MD  cholecalciferol  (CHOLECALCIFEROL ) 25 MCG tablet Take 1 tablet (1,000 Units total) by mouth daily. 01/06/24   Danton Reyes DASEN, MD  donepezil  (ARICEPT ) 10 MG tablet Take 1 tablet (10 mg total) by mouth at bedtime. 04/07/24   Norleen Lynwood ORN, MD  levothyroxine  (SYNTHROID ) 112 MCG tablet Take 1 tablet (112 mcg total) by mouth daily. 04/07/24   Norleen Lynwood ORN, MD  Multiple Minerals-Vitamins (CAL-MAG-ZINC-D PO) Take 1 tablet by  mouth in the morning and at bedtime.    [provider]  traMADol  (ULTRAM ) 50 MG tablet Take 1 tablet (50 mg total) by mouth every 8 (eight) hours as needed for moderate pain (pain score 4-6) or severe pain (pain score 7-10). 04/09/24   Aron Shoulders, MD    Allergies: Seroquel  [quetiapine ], Codeine, and Oxycodone     Review of Systems  Updated Vital Signs BP (!) 173/106   Pulse 61   Temp 99.9 F (37.7 C) (Oral)   Resp 11   SpO2 100%   Physical Exam Vitals and nursing note reviewed.  HENT:     Head:     Comments: 3 cm laceration vertically oriented through medial left brow with active bleeding Scattered abrasions over bridge of nose Eyes:     Pupils: Pupils are equal, round, and reactive to light.  Cardiovascular:     Rate and Rhythm: Normal rate and regular rhythm.  Pulmonary:     Effort: Pulmonary effort is normal.     Breath sounds: Normal breath sounds.  Abdominal:     Palpations: Abdomen is soft.     Tenderness: There is no abdominal tenderness.  Musculoskeletal:     Cervical back: Neck supple. No tenderness.     Comments: Superficial abrasion over dorsal aspect of left hand Full active range of motion  bilateral upper and lower extremities No midline tender step-off Tormey of back No sacral wound  Skin:    General: Skin is warm and dry.  Neurological:     Mental Status: She is alert.  Psychiatric:        Mood and Affect: Mood normal.     (all labs ordered are listed, but only abnormal results are displayed) Labs Reviewed  COMPREHENSIVE METABOLIC PANEL WITH GFR - Abnormal; Notable for the following components:      Result Value   Glucose, Bld 109 (*)    Creatinine, Ser 1.49 (*)    AST 94 (*)    ALT 75 (*)    GFR, Estimated 34 (*)    All other components within normal limits  CBC - Abnormal; Notable for the following components:   RBC 3.46 (*)    Hemoglobin 11.5 (*)    HCT 33.0 (*)    All other components within normal limits  CK - Abnormal;  Notable for the following components:   Total CK 640 (*)    All other components within normal limits  URINALYSIS, W/ REFLEX TO CULTURE (INFECTION SUSPECTED) - Abnormal; Notable for the following components:   APPearance CLOUDY (*)    Hgb urine dipstick MODERATE (*)    Protein, ur 30 (*)    Nitrite POSITIVE (*)    Leukocytes,Ua MODERATE (*)    Bacteria, UA FEW (*)    All other components within normal limits  I-STAT CHEM 8, ED - Abnormal; Notable for the following components:   BUN 24 (*)    Creatinine, Ser 1.60 (*)    Glucose, Bld 111 (*)    Hemoglobin 11.6 (*)    HCT 34.0 (*)    All other components within normal limits  URINE CULTURE  PROTIME-INR  COMPREHENSIVE METABOLIC PANEL WITH GFR  CBC  CK  I-STAT CG4 LACTIC ACID, ED  SAMPLE TO BLOOD BANK    EKG: None  Radiology: CT HEAD WO CONTRAST Result Date: 11/10/2024 EXAM: CT HEAD, FACIAL BONES AND CERVICAL SPINE WITHOUT CONTRAST 11/10/2024 06:19:24 PM TECHNIQUE: CT of the head, facial bones and cervical spine was performed without the administration of intravenous contrast. Multiplanar reformatted images are provided for review. Automated exposure control, iterative reconstruction, and/or weight based adjustment of the mA/kV was utilized to reduce the radiation dose to as low as reasonably achievable. COMPARISON: CT head 12/31/2023. CLINICAL HISTORY: Head trauma, moderate-severe. FINDINGS: CT HEAD BRAIN AND VENTRICLES: No acute intracranial hemorrhage. No mass effect or midline shift. No extra-axial fluid collection. No evidence of acute infarct. No hydrocephalus. Stable background of moderate chronic small vessel disease. SKULL AND SCALP: No acute skull fracture. Left frontal scalp hematoma. CT FACIAL BONES FACIAL BONES: No acute facial fracture. No mandibular dislocation. No suspicious bone lesion. ORBITS: No acute traumatic injury. SINUSES AND MASTOIDS: Partial opacification of the bilateral mastoid air cells. No acute abnormality  of the sinuses. SOFT TISSUES: No acute abnormality. CT CERVICAL SPINE BONES AND ALIGNMENT: No acute fracture or traumatic malalignment. DEGENERATIVE CHANGES: Multilevel degenerative changes of the cervical spine without high grade spinal canal stenosis. SOFT TISSUES: No prevertebral soft tissue swelling. IMPRESSION: 1. No acute intracranial abnormality. 2. Left frontal scalp hematoma. 3. No acute facial fracture. 4. No acute fracture or traumatic malalignment of the cervical spine. Electronically signed by: Ryan Chess MD 11/10/2024 06:28 PM EST RP Workstation: [id number]   CT MAXILLOFACIAL WO CONTRAST Result Date: 11/10/2024 EXAM: CT HEAD, FACIAL BONES AND CERVICAL SPINE WITHOUT CONTRAST 11/10/2024  93:80:75 PM TECHNIQUE: CT of the head, facial bones and cervical spine was performed without the administration of intravenous contrast. Multiplanar reformatted images are provided for review. Automated exposure control, iterative reconstruction, and/or weight based adjustment of the mA/kV was utilized to reduce the radiation dose to as low as reasonably achievable. COMPARISON: CT head 12/31/2023. CLINICAL HISTORY: Head trauma, moderate-severe. FINDINGS: CT HEAD BRAIN AND VENTRICLES: No acute intracranial hemorrhage. No mass effect or midline shift. No extra-axial fluid collection. No evidence of acute infarct. No hydrocephalus. Stable background of moderate chronic small vessel disease. SKULL AND SCALP: No acute skull fracture. Left frontal scalp hematoma. CT FACIAL BONES FACIAL BONES: No acute facial fracture. No mandibular dislocation. No suspicious bone lesion. ORBITS: No acute traumatic injury. SINUSES AND MASTOIDS: Partial opacification of the bilateral mastoid air cells. No acute abnormality of the sinuses. SOFT TISSUES: No acute abnormality. CT CERVICAL SPINE BONES AND ALIGNMENT: No acute fracture or traumatic malalignment. DEGENERATIVE CHANGES: Multilevel degenerative changes of the cervical spine without  high grade spinal canal stenosis. SOFT TISSUES: No prevertebral soft tissue swelling. IMPRESSION: 1. No acute intracranial abnormality. 2. Left frontal scalp hematoma. 3. No acute facial fracture. 4. No acute fracture or traumatic malalignment of the cervical spine. Electronically signed by: Ryan Chess MD 11/10/2024 06:28 PM EST RP Workstation: [id number]   CT CERVICAL SPINE WO CONTRAST Result Date: 11/10/2024 EXAM: CT HEAD, FACIAL BONES AND CERVICAL SPINE WITHOUT CONTRAST 11/10/2024 06:19:24 PM TECHNIQUE: CT of the head, facial bones and cervical spine was performed without the administration of intravenous contrast. Multiplanar reformatted images are provided for review. Automated exposure control, iterative reconstruction, and/or weight based adjustment of the mA/kV was utilized to reduce the radiation dose to as low as reasonably achievable. COMPARISON: CT head 12/31/2023. CLINICAL HISTORY: Head trauma, moderate-severe. FINDINGS: CT HEAD BRAIN AND VENTRICLES: No acute intracranial hemorrhage. No mass effect or midline shift. No extra-axial fluid collection. No evidence of acute infarct. No hydrocephalus. Stable background of moderate chronic small vessel disease. SKULL AND SCALP: No acute skull fracture. Left frontal scalp hematoma. CT FACIAL BONES FACIAL BONES: No acute facial fracture. No mandibular dislocation. No suspicious bone lesion. ORBITS: No acute traumatic injury. SINUSES AND MASTOIDS: Partial opacification of the bilateral mastoid air cells. No acute abnormality of the sinuses. SOFT TISSUES: No acute abnormality. CT CERVICAL SPINE BONES AND ALIGNMENT: No acute fracture or traumatic malalignment. DEGENERATIVE CHANGES: Multilevel degenerative changes of the cervical spine without high grade spinal canal stenosis. SOFT TISSUES: No prevertebral soft tissue swelling. IMPRESSION: 1. No acute intracranial abnormality. 2. Left frontal scalp hematoma. 3. No acute facial fracture. 4. No acute fracture  or traumatic malalignment of the cervical spine. Electronically signed by: Ryan Chess MD 11/10/2024 06:28 PM EST RP Workstation: [id number]   DG Pelvis Portable Result Date: 11/10/2024 CLINICAL DATA:  Trauma unwitnessed fall EXAM: PORTABLE PELVIS 1-2 VIEWS COMPARISON:  03/16/2020 FINDINGS: SI joints are non widened. Pubic symphysis and rami appear grossly intact. Right hip replacement with normal alignment. No definitive fracture. Moderate left hip degenerative change IMPRESSION: No acute osseous abnormality. Right hip replacement with normal alignment. Moderate left hip degenerative change. Electronically Signed   By: Luke Bun M.D.   On: 11/10/2024 18:14   DG Chest Port 1 View Result Date: 11/10/2024 CLINICAL DATA:  Trauma EXAM: PORTABLE CHEST 1 VIEW COMPARISON:  11/05/2021 FINDINGS: Left-sided pacing device as before. Stable cardiomediastinal silhouette with aortic atherosclerosis. No focal opacity, pleural effusion, or pneumothorax. Clips in the right axilla IMPRESSION: No active disease.  Electronically Signed   By: Luke Bun M.D.   On: 11/10/2024 18:14     .Laceration Repair  Date/Time: 11/10/2024 6:08 PM  Performed by: Pamella Ozell LABOR, DO Authorized by: Pamella Ozell LABOR, DO   Consent:    Consent obtained:  Verbal   Consent given by:  Patient   Risks discussed:  Infection, need for additional repair, nerve damage and poor wound healing   Alternatives discussed:  No treatment Universal protocol:    Patient identity confirmed:  Verbally with patient Anesthesia:    Anesthesia method:  Local infiltration   Local anesthetic:  Lidocaine  1% w/o epi Laceration details:    Location:  Face   Face location:  L eyebrow   Length (cm):  3   Depth (mm):  0.5 Pre-procedure details:    Preparation:  Patient was prepped and draped in usual sterile fashion and imaging obtained to evaluate for foreign bodies Exploration:    Contaminated: no   Treatment:    Area cleansed with:   Saline   Amount of cleaning:  Extensive   Irrigation method:  Pressure wash Skin repair:    Repair method:  Sutures   Suture size:  5-0   Wound skin closure material used: Vicryl rapide.   Suture technique:  Simple interrupted   Number of sutures:  4 Approximation:    Approximation:  Close Repair type:    Repair type:  Simple Post-procedure details:    Dressing:  Adhesive bandage   Procedure completion:  Tolerated    Medications Ordered in the ED  sodium chloride  flush (NS) 0.9 % injection 3 mL (has no administration in time range)  lactated ringers  infusion (has no administration in time range)  acetaminophen  (TYLENOL ) tablet 650 mg (has no administration in time range)    Or  acetaminophen  (TYLENOL ) suppository 650 mg (has no administration in time range)  ondansetron  (ZOFRAN ) tablet 4 mg (has no administration in time range)    Or  ondansetron  (ZOFRAN ) injection 4 mg (has no administration in time range)  senna-docusate (Senokot-S) tablet 1 tablet (has no administration in time range)  atenolol  (TENORMIN ) tablet 50 mg (has no administration in time range)  donepezil  (ARICEPT ) tablet 10 mg (has no administration in time range)  levothyroxine  (SYNTHROID ) tablet 112 mcg (has no administration in time range)  lidocaine  (PF) (XYLOCAINE ) 1 % injection (30 mLs  Given 11/10/24 1749)  sodium chloride  0.9 % bolus 1,000 mL (1,000 mLs Intravenous New Bag/Given 11/10/24 2118)  cefTRIAXone  (ROCEPHIN ) 2 g in sodium chloride  0.9 % 100 mL IVPB (2 g Intravenous New Bag/Given 11/10/24 2214)    Clinical Course as of 11/10/24 2338  Mon Nov 10, 2024  2245 Laboratory workup notable for UTI.  CK mildly elevated not consistent with rhabdo.  No evidence of acute traumatic findings.  will initiateAntibiotics and admit to medicine.  Dr. Tobie accepts patient for admission [MP]    Clinical Course User Index [MP] Pamella Ozell LABOR, DO                                 Medical Decision  Making 88 year old female presenting via EMS for fall at home.  Unwitnessed fall.  Unable to recall details.  Does have history of dementia and lives alone.  Obvious facial trauma with left brow lack which was repaired by me with dissolvable sutures as documented.  Unknown downtime at home.  Will obtain laboratory workup to  look for infectious etiology such as pneumonia or UTI as a call for weakness and fall.  Will obtain CT head C-spine maxillofacial chest x-ray pelvis x-ray to evaluate for traumatic injury.  Will obtain secondary history from daughter.  Admission likely.  Amount and/or Complexity of Data Reviewed Labs: ordered. Radiology: ordered.  Risk Decision regarding hospitalization.        Final diagnoses:  Facial laceration, initial encounter  Lower urinary tract infectious disease  Fall, initial encounter    ED Discharge Orders     None          Pamella Ozell LABOR, DO 11/10/24 2338  "

## 2024-11-11 ENCOUNTER — Other Ambulatory Visit: Payer: Self-pay

## 2024-11-11 DIAGNOSIS — S0181XA Laceration without foreign body of other part of head, initial encounter: Secondary | ICD-10-CM | POA: Diagnosis not present

## 2024-11-11 DIAGNOSIS — Z950 Presence of cardiac pacemaker: Secondary | ICD-10-CM | POA: Diagnosis not present

## 2024-11-11 DIAGNOSIS — Z7189 Other specified counseling: Secondary | ICD-10-CM

## 2024-11-11 DIAGNOSIS — Z515 Encounter for palliative care: Secondary | ICD-10-CM

## 2024-11-11 DIAGNOSIS — S01119A Laceration without foreign body of unspecified eyelid and periocular area, initial encounter: Secondary | ICD-10-CM

## 2024-11-11 DIAGNOSIS — R748 Abnormal levels of other serum enzymes: Secondary | ICD-10-CM | POA: Diagnosis not present

## 2024-11-11 DIAGNOSIS — W19XXXA Unspecified fall, initial encounter: Secondary | ICD-10-CM | POA: Diagnosis not present

## 2024-11-11 DIAGNOSIS — E039 Hypothyroidism, unspecified: Secondary | ICD-10-CM

## 2024-11-11 DIAGNOSIS — Z711 Person with feared health complaint in whom no diagnosis is made: Secondary | ICD-10-CM

## 2024-11-11 DIAGNOSIS — Y92009 Unspecified place in unspecified non-institutional (private) residence as the place of occurrence of the external cause: Secondary | ICD-10-CM | POA: Diagnosis not present

## 2024-11-11 DIAGNOSIS — I10 Essential (primary) hypertension: Secondary | ICD-10-CM | POA: Diagnosis not present

## 2024-11-11 DIAGNOSIS — F03B18 Unspecified dementia, moderate, with other behavioral disturbance: Secondary | ICD-10-CM | POA: Diagnosis not present

## 2024-11-11 LAB — COMPREHENSIVE METABOLIC PANEL WITH GFR
ALT: 60 U/L — ABNORMAL HIGH (ref 0–44)
AST: 84 U/L — ABNORMAL HIGH (ref 15–41)
Albumin: 4.2 g/dL (ref 3.5–5.0)
Alkaline Phosphatase: 80 U/L (ref 38–126)
Anion gap: 14 (ref 5–15)
BUN: 20 mg/dL (ref 8–23)
CO2: 23 mmol/L (ref 22–32)
Calcium: 9.7 mg/dL (ref 8.9–10.3)
Chloride: 103 mmol/L (ref 98–111)
Creatinine, Ser: 1.42 mg/dL — ABNORMAL HIGH (ref 0.44–1.00)
GFR, Estimated: 36 mL/min — ABNORMAL LOW
Glucose, Bld: 116 mg/dL — ABNORMAL HIGH (ref 70–99)
Potassium: 3.9 mmol/L (ref 3.5–5.1)
Sodium: 139 mmol/L (ref 135–145)
Total Bilirubin: 0.9 mg/dL (ref 0.0–1.2)
Total Protein: 6.9 g/dL (ref 6.5–8.1)

## 2024-11-11 LAB — CBC
HCT: 32 % — ABNORMAL LOW (ref 36.0–46.0)
Hemoglobin: 10.8 g/dL — ABNORMAL LOW (ref 12.0–15.0)
MCH: 32.9 pg (ref 26.0–34.0)
MCHC: 33.8 g/dL (ref 30.0–36.0)
MCV: 97.6 fL (ref 80.0–100.0)
Platelets: 143 K/uL — ABNORMAL LOW (ref 150–400)
RBC: 3.28 MIL/uL — ABNORMAL LOW (ref 3.87–5.11)
RDW: 13.1 % (ref 11.5–15.5)
WBC: 6.2 K/uL (ref 4.0–10.5)
nRBC: 0 % (ref 0.0–0.2)

## 2024-11-11 LAB — CK: Total CK: 849 U/L — ABNORMAL HIGH (ref 38–234)

## 2024-11-11 MED ORDER — BIOTENE DRY MOUTH MT LIQD: 15.0000 mL | OROMUCOSAL | Status: DC | PRN

## 2024-11-11 MED ORDER — GLYCOPYRROLATE 0.2 MG/ML IJ SOLN: 0.2000 mg | INTRAMUSCULAR | Status: DC | PRN

## 2024-11-11 MED ORDER — POLYVINYL ALCOHOL 1.4 % OP SOLN: 1.0000 [drp] | Freq: Four times a day (QID) | OPHTHALMIC | Status: DC | PRN

## 2024-11-11 MED ORDER — HALOPERIDOL LACTATE 5 MG/ML IJ SOLN: 5.0000 mg | Freq: Four times a day (QID) | INTRAMUSCULAR | Status: DC | PRN

## 2024-11-11 MED ORDER — SODIUM CHLORIDE 0.9 % IV SOLN
1.0000 g | INTRAVENOUS | Status: DC
  Administered 2024-11-11: 1 g via INTRAVENOUS
  Filled 2024-11-11: qty 10

## 2024-11-11 MED ORDER — GLYCOPYRROLATE 1 MG PO TABS: 1.0000 mg | ORAL_TABLET | ORAL | Status: DC | PRN

## 2024-11-11 MED ORDER — OLANZAPINE 10 MG IM SOLR
2.5000 mg | Freq: Once | INTRAMUSCULAR | Status: AC | PRN
  Administered 2024-11-11: 2.5 mg via INTRAMUSCULAR
  Filled 2024-11-11 (×2): qty 10

## 2024-11-11 MED ORDER — STERILE WATER FOR INJECTION IJ SOLN
INTRAMUSCULAR | Status: AC
  Administered 2024-11-11: 2.1 mL
  Filled 2024-11-11: qty 10

## 2024-11-11 MED ORDER — HALOPERIDOL LACTATE 5 MG/ML IJ SOLN
5.0000 mg | Freq: Once | INTRAMUSCULAR | Status: AC
  Administered 2024-11-11: 5 mg via INTRAVENOUS
  Filled 2024-11-11: qty 1

## 2024-11-11 NOTE — Care Management Obs Status (Signed)
 MEDICARE OBSERVATION STATUS NOTIFICATION   Patient Details  Name: Danielle [name] MRN: [id number] Date of Birth: October 02, 1936   Medicare Observation Status Notification Given:  Yes  Verbally reviewed observation notice with [name] telephonically at [phone].  Will mail a copy of this notice to the patients home address.   Ceriah Kohler 11/11/2024, 4:26 PM

## 2024-11-11 NOTE — ED Notes (Signed)
 Trauma Event Note    Nonactivated trauma--   Pt arrived to ED via [facility] from her own home, where she lives alone. Has a caregiver part of the day. Pt's friend checked on her at 3pm and found pt on the floor in the kitchen, dried blood on face, with lac through left eyebrow that is still bleeding but controlled.  C-Collar on per EMS, no IV per EMS- IV started per Damien, RN - primary nurse  Alert/ oriented x 3 - GCS 14- does know that she fell and hit her head, unsure of time frame, attempts to answer questions with questions- deflecting confusion.   Pt had on brief that was saturated in urine and stool. Brief removed and bathed. Redness noted on buttocks that blanched easily- mepalex placed after bath-   Dr. Pamella placed 4 sutures in laceration, bleeding controlled.   Last imported Vital Signs BP (!) 184/168   Pulse 84   Temp (!) 97.1 F (36.2 C) (Tympanic)   Resp 16   SpO2 100%   Trending CBC Recent Labs    11/10/24 1730 11/10/24 1753 11/11/24 0309  WBC 7.6  --  6.2  HGB 11.5* 11.6* 10.8*  HCT 33.0* 34.0* 32.0*  PLT 175  --  143*    Trending Coag's Recent Labs    11/10/24 1730  INR 1.0    Trending BMET Recent Labs    11/10/24 1730 11/10/24 1753 11/11/24 0309  NA 139 139 139  K 4.4 4.4 3.9  CL 101 102 103  CO2 25  --  23  BUN 21 24* 20  CREATININE 1.49* 1.60* 1.42*  GLUCOSE 109* 111* 116*      Camber Ninh M Orissa Arreaga  Trauma Response RN  Please call TRN at [phone] for further assistance.

## 2024-11-11 NOTE — Progress Notes (Signed)
 Please call daughter Gustav when gets to inpatient floor.  [phone]

## 2024-11-11 NOTE — Progress Notes (Signed)
" ° °  Referral received for hospice services at home. Reviewed the chart discussed hospice services and the interdisciplinary team approach at home with focus on comfort care. Daughter Gustav is in agreement with hospice care. She reports the pt will go back home with a friend who will provided 24/7 care. She also reports that the pt will go home to:   [address] [place] [address]   She is requsting hospital bed and over bed table. Already owns walker, wheelchair and BSC.    Gustav reports that she has hired some movers to come assist with getting her room ready at home and will need to call me back when the equipment can be delivered as of now she does not know the exact time and date the movers will be coming due to holiday hours etc.   Reviewed with my MD and she was approved to go home with hospice services.   Magdalena Berber RN [phone]  "

## 2024-11-11 NOTE — Hospital Course (Signed)
 Danielle [name] is a 88 y.o. female with medical history significant for CHB s/p PPM, CKD stage IIIb, HTN, HLD, hypothyroidism, recurrent right breast cancer, dementia who is admitted after fall at home with prolonged downtime.

## 2024-11-11 NOTE — Progress Notes (Addendum)
 Called family daughter Gustav to do admission daughter requested palliative consult due to mother decline order placed  Admission questions answered   Daughter sees patient regularly and patient was with the care giver when fall happened caregiver was just not in the same room so unwitnessed fall

## 2024-11-11 NOTE — Progress Notes (Signed)
 Patient unable to answer admission quesitons filled out some from provider notes and other notes  will call family for others

## 2024-11-11 NOTE — ED Notes (Signed)
 Doctor returned phone call and this RN Billye) stated that the patient was acting different from earlier. Patient more agitated and restless. Patient unable to speak sentences like earlier, speech sounding slurred. Doctor placed order for HALDOL  for agitation.

## 2024-11-11 NOTE — ED Notes (Signed)
 Asked secretary to page on-call doctor to ask about patients condition. Seems to be getting worse. She's restless and agitated

## 2024-11-11 NOTE — Progress Notes (Signed)
 Called daughter to let her know patient just left the ED heading upstairs

## 2024-11-11 NOTE — ED Notes (Signed)
 Safety Mittens applied per MD to keep patient from pulling off leads and to prevent her from pulling out IV

## 2024-11-11 NOTE — Progress Notes (Signed)
 Transition of Care [facility]) - Inpatient Brief Assessment   Patient Details  Name: Danielle [name] MRN: [id number] Date of Birth: [birth year]  Transition of [facility]) CM/SW Contact:    Debarah Saunas, RN Phone Number: 11/11/2024, 12:11 PM   Clinical Narrative: RNCM consulted regarding home hospice.  Pt chose [facility].  Magdalena Berber accepted consult and will access pt for intake.  ICM will continue to follow for disposition needs.   Transition of Care Asessment: Insurance and Status: (P) Insurance coverage has been reviewed Patient has primary care physician: (P) Yes Home environment has been reviewed: (P) home alone Prior level of function:: (P) has part-time caregiver in place Prior/Current Home Services: (P) Current home services Social Drivers of Health Review: (P) SDOH reviewed no interventions necessary Readmission risk has been reviewed: (P) Yes Transition of care needs: (P) transition of care needs identified, TOC will continue to follow

## 2024-11-11 NOTE — Progress Notes (Signed)
 OT Cancellation Note  Patient Details Name: Danielle [name] MRN: [id number] DOB: 05/16/1936   Cancelled Treatment:    Reason Eval/Treat Not Completed: Fatigue/lethargy limiting ability to participate;Patient at procedure or test/ unavailable, OT will follow up next available time  Jacques Karna Loose 11/11/2024, 11:44 AM

## 2024-11-11 NOTE — Progress Notes (Signed)
 PT Cancellation Note  Patient Details Name: Danielle [name] MRN: [id number] DOB: 03-04-1936   Cancelled Treatment:    Reason Eval/Treat Not Completed: Patient's level of consciousness. PT us  unable to arouse the pt with verbal or tactile stimulation. Pt appears to have received haldol  overnight due to restlessness. PT will attempt to follow up when the patient is more alert and better able to participate in PT evaluation.   Bernardino JINNY Ruth 11/11/2024, 11:22 AM

## 2024-11-11 NOTE — ED Notes (Signed)
 Patient is able to state name and birth date but words are choppy and short. Patient is still restless.

## 2024-11-11 NOTE — TOC Progression Note (Addendum)
 Transition of Care [facility]) - Progression Note    Patient Details  Name: Danielle [name] MRN: [id number] Date of Birth: 1936/02/13  [facility]) CM/SW Contact  Rosaline JONELLE Joe, RN Phone Number: 11/11/2024, 1:38 PM  Clinical Narrative:    CM spoke with Rosaline Dauphin, NP with Palliative Care and she states that patient's daughter, Gustav would like patient to be transitioned to home with home hospice through [facility].  I called the patient's daughter by phone and left a message with her since no family are present in the hospital room at this time.  Referral to [facility] was made prior to patient's transition to [facility] floor.  Magdalena Berber, RNCM with [facility] will follow up with the family to coordinate home hospice care and equipment.  DNR was filled out and MD was request to sign today if possible.  11/11/24 1458 - CM spoke with Magdalena Berber, CM with [facility] and she states that patient's daughter plans for patient to discharge to [address], [place], [place].  The daughter states that the daughter has hired scientist, forensic to move furniture out of bedroom to make room for arriving hospital bed, bedside table.  Magdalena Berber, RNCM will reach out to [facility] Care management once equipment has been delivered to the home over the next couple of days.  DME at the home include RW WC, and BSC.  CM will continue to follow the patient for discharge home in the next couple days - pending delivery of hospital bed and equipment to the home.                     Expected Discharge Plan and Services                                               Social Drivers of Health (SDOH) Interventions SDOH Screenings   Food Insecurity: No Food Insecurity (11/11/2024)  Housing: Low Risk (11/11/2024)  Transportation Needs: No Transportation Needs (11/11/2024)  Utilities: Not At Risk (11/11/2024)  Alcohol  Screen:  Low Risk (03/19/2024)  Depression (PHQ2-9): Low Risk (01/29/2024)  Financial Resource Strain: Low Risk (03/19/2024)  Physical Activity: Insufficiently Active (03/19/2024)  Social Connections: Socially Isolated (11/11/2024)  Stress: Stress Concern Present (03/19/2024)  Tobacco Use: Medium Risk (11/10/2024)    Readmission Risk Interventions     No data to display

## 2024-11-11 NOTE — Consult Note (Signed)
 " Palliative Medicine Inpatient Consult Note  Consulting Provider: Franchot Peyton SQUIBB, RN; Dr. Darci  Reason for consult:   Palliative Care Consult Services Palliative Medicine Consult  Reason for Consult? Call  patient daughter Danielle [name] at [phone]   11/11/2024  HPI:  Per intake H&P --> Danielle [name] is a 88 y.o. female with medical history significant for CHB s/p PPM, CKD stage IIIb, HTN, HLD, hypothyroidism, recurrent right breast cancer, dementia who presented to the ED for evaluation after an unwitnessed fall at home with prolonged downtime. Palliative care asked to support additional goals of care conversations.  Clinical Assessment/Goals of Care:  *Please note that this is a verbal dictation therefore any spelling or grammatical errors are due to the Dragon Medical One system interpretation.  I have reviewed medical records including EPIC notes, labs and imaging, received report from bedside RN, assessed the patient.    I met with Danielle [name] daughter, Danielle [name] to further discuss diagnosis prognosis, GOC, EOL wishes, disposition and options.   I introduced Palliative Medicine as specialized medical care for people living with serious illness. It focuses on providing relief from the symptoms and stress of a serious illness. The goal is to improve quality of life for both the patient and the family.  Medical History Review and Understanding:  A review of Danielle [name]'s past medical history significant for complete heart block requiring a pacemaker, chronic kidney disease, hypertension, hyperlipidemia, hypothyroidism, recurrent right breast cancer, dementia was completed  Social History:  Danielle [name] is originally from [place].  She is a widow as her husband passed away 4 years ago.  She has 4 children.  She formally worked in clinical research associate .  She is a woman who enjoys writing and Poetics.  She is a woman of faith practicing within [employer]ism.  Functional and  Nutritional State:  Preceding hospitalization Danielle [name] lived in her home.  She has a caregiver by the name of Candyce who would help her for 6 to 8 hours a day.  Her daughter Danielle [name] would help with bathing.  She did have a fair appetite preceding hospitalization.  Advance Directives:  A detailed discussion was had today regarding advanced directives.  Patient does not have advanced directive though all children are involved in communication with Danielle [name] patient's youngest daughter.  Code Status:  Concepts specific to code status, artifical feeding and hydration, continued IV antibiotics and rehospitalization was had.  The difference between a aggressive medical intervention path  and a palliative comfort care path for this patient at this time was had.   Danielle [name] is an established DO NOT RESUSCITATE DO NOT INTUBATE CODE STATUS.  Discussion:  Danielle [name] and I reviewed the circumstances surrounding Danielle [name]'s hospitalization.  We discussed her falls which have occurred.  We discussed her declining health state over the past few months in the setting of her worsening dementia.  We reviewed how difficult it has been to care for Danielle [name] as she has vehemently independent.  Her family shared that it has been hard to provide care as she has never wanted to leave her home therefore they have worked with various caregiver companies and finally found a family friend who is able to get along with her and provide excellent care.  We reviewed that even preceding this hospitalization Danielle [name] had been considering the transition to more of a hospice mediated plan of care for Danielle [name].  She had initially thought this would have been around the first of the year though unfortunately Danielle [name] had her falls and is  now hospitalized.  We discussed options moving forward and Danielle [name] is very clear that her mother does not want to be poked and prodded any longer.  She does not respond well to this nor does she respond well to being  in a foreign environment.  Reviewed options moving forward inclusive of comfort and hospice care. We talked about transition to comfort measures in house and what that would entail inclusive of medications to control pain, dyspnea, agitation, nausea, itching, and hiccups.  We discussed stopping all uneccessary measures such as cardiac monitoring, blood draws, needle sticks, and frequent vital signs.   I described hospice as a service for patients who have a life expectancy of 6 months or less. The goal of hospice is the preservation of dignity and quality at the end phases of life. Under hospice care, the focus changes from curative to symptom relief.   Danielle [name] shares that she would want to pursue comfort care and get Danielle [name] home with hospice.  She shares that her father worked with [facility] during his final stages and she had a very good experience with them.  She is agreeable to the transitions of care team reaching out.  Danielle [name] shares there are some durable medical equipment needs which we reviewed would be provided by the hospice company.  The goals at this time are to get Danielle [name] home sooner than later with hospice care.  Discussed the importance of continued conversation with family and their  medical providers regarding overall plan of care and treatment options, ensuring decisions are within the context of the patients values and GOCs.  Decision Maker: Danielle [name] Danielle [name]: Daughter, Emergency Contact: [phone] (Mobile)   SUMMARY OF RECOMMENDATIONS   DNAR/DNI  Comfort measures  Continue antibiotics until the time of discharge  Have stopped invasive interventions  Comfort medications per [facility]  Appreciate TOC helping to coordinate home hospice  Appreciate [facility] speaking to patient's daughter  Ongoing palliative care support during hospitalization  Code Status/Advance Care Planning: DNAR/DNI  Palliative Prophylaxis:  Aspiration, Bowel  Regimen, Delirium Protocol, Frequent Pain Assessment, Oral Care, Palliative Wound Care, and Turn Reposition  Additional Recommendations (Limitations, Scope, Preferences): Continue current care  Psycho-social/Spiritual:  Desire for further Chaplaincy support: Yes Additional Recommendations: Education on disease burden   Prognosis: Limited overall hospice is an appropriate consideration  Discharge Planning: Discharge home with hospice support  Vitals:   11/11/24 0901 11/11/24 0928  BP:  (!) 184/168  Pulse:  84  Resp:    Temp: (!) 97.1 F (36.2 C)   SpO2:      Intake/Output Summary (Last 24 hours) at 11/11/2024 1123 Last data filed at 11/11/2024 0000 Gross per 24 hour  Intake 1100 ml  Output --  Net 1100 ml   LABS: CBC:    Component Value Date/Time   WBC 6.2 11/11/2024 0309   HGB 10.8 (L) 11/11/2024 0309   HGB 10.9 (L) 03/09/2022 1536   HGB 11.1 (L) 07/25/2017 1347   HCT 32.0 (L) 11/11/2024 0309   HCT 33.2 (L) 03/09/2022 1536   HCT 34.1 (L) 07/25/2017 1347   PLT 143 (L) 11/11/2024 0309   PLT 166 03/09/2022 1536   MCV 97.6 11/11/2024 0309   MCV 93 03/09/2022 1536   MCV 96.3 07/25/2017 1347   NEUTROABS 4.1 01/29/2024 1348   NEUTROABS 2.5 07/25/2017 1347   LYMPHSABS 2.1 01/29/2024 1348   LYMPHSABS 1.9 07/25/2017 1347   MONOABS 0.8 01/29/2024 1348   MONOABS 0.6 07/25/2017 1347  EOSABS 0.2 01/29/2024 1348   EOSABS 0.2 07/25/2017 1347   BASOSABS 0.1 01/29/2024 1348   BASOSABS 0.0 07/25/2017 1347   Comprehensive Metabolic Panel:    Component Value Date/Time   NA 139 11/11/2024 0309   NA 141 04/20/2022 1528   NA 140 07/25/2017 1347   K 3.9 11/11/2024 0309   K 4.3 07/25/2017 1347   CL 103 11/11/2024 0309   CO2 23 11/11/2024 0309   CO2 26 07/25/2017 1347   BUN 20 11/11/2024 0309   BUN 37 (H) 04/20/2022 1528   BUN 27.4 (H) 07/25/2017 1347   CREATININE 1.42 (H) 11/11/2024 0309   CREATININE 1.11 (H) 09/10/2020 1337   CREATININE 1.1 07/25/2017 1347   GLUCOSE  116 (H) 11/11/2024 0309   GLUCOSE 77 07/25/2017 1347   CALCIUM  9.7 11/11/2024 0309   CALCIUM  9.8 07/25/2017 1347   AST 84 (H) 11/11/2024 0309   AST 20 09/10/2020 1337   AST 29 07/25/2017 1347   ALT 60 (H) 11/11/2024 0309   ALT 9 09/10/2020 1337   ALT 28 07/25/2017 1347   ALKPHOS 80 11/11/2024 0309   ALKPHOS 75 07/25/2017 1347   BILITOT 0.9 11/11/2024 0309   BILITOT 1.2 09/10/2020 1337   BILITOT 1.21 (H) 07/25/2017 1347   PROT 6.9 11/11/2024 0309   PROT 6.6 07/25/2017 1347   ALBUMIN 4.2 11/11/2024 0309   ALBUMIN 4.0 07/25/2017 1347   Gen: Elderly chronically ill-appearing Caucasian female HEENT: Facial bruising to left side of face, dry mucous membranes CV: Regular rate and rhythm PULM: On room air breathing is even and nonlabored ABD: soft/nontender EXT: No edema  Neuro: Aware of self though completely disoriented  PPS: 10%   This conversation/these recommendations were discussed with patient primary care team, Dr. Darci ______________________________________________________ Rosaline Becton [facility] Palliative Medicine Team Team Cell Phone: [phone] Please utilize secure chat with additional questions, if there is no response within 30 minutes please call the above phone number  Total Time: 75 Billing based on MDM: High  Palliative Medicine Team providers are available by phone from 7am to 7pm daily and can be reached through the team cell phone.  Should this patient require assistance outside of these hours, please call the patient's attending physician.  "

## 2024-11-11 NOTE — Progress Notes (Signed)
 " Progress Note   Patient: Danielle [name] FMW:[id number] DOB: 1936/05/02 DOA: 11/10/2024     0 DOS: the patient was seen and examined on 11/11/2024   Brief hospital course: CHARLIE CHAR is a 88 y.o. female with medical history significant for CHB s/p PPM, CKD stage IIIb, HTN, HLD, hypothyroidism, recurrent right breast cancer, dementia who is admitted after fall at home with prolonged downtime.  Patient is admitted for evaluation of fall, head injury, possible UTI. Patient remained confused, agitated while in the emergency department.  Patient required mittens, frequent orientation. Palliative team evaluated the patient, discussed with family who requested hospice care at home.  Assessment and Plan: S/p unwitnessed fall Presented with unwitnessed fall, prolonged downtime, head injury, CK6 40. Gentle IV hydration. Repeat CK level in AM. Fall precautions. Patient has advanced dementia, palliative team discussed with family who wished for hospice care at home. Stop all her home meds. Will give pain medications, supportive care.  Recurrent right breast cancer- S/p mastectomy, more recent excision of chest wall mass in May 2025. Per daughter, decision made not to pursue additional treatment. Home hospice will be arranged.  Complete heart block status post pacemaker- Hypertension Hyperlipidemia Hypothyroidism Will hold off all home medications at this time.  Forehead laceration status post repair Suture removal in 3 to 5 days.  Abnormal urinalysis  Rocephin  therapy until she is discharge home with hospice care.  Disposition- Home hospice      Out of bed to chair. Incentive spirometry. Nursing supportive care. Fall, aspiration precautions. Diet:  Diet Orders (From admission, onward)     Start     Ordered   11/10/24 2329  Diet regular Fluid consistency: Thin  Diet effective now       Question:  Fluid consistency:  Answer:  Thin   11/10/24 2328           DVT  prophylaxis: SCDs Start: 11/10/24 2331  Level of care: Telemetry   Code Status: Do not attempt resuscitation (DNR) - Comfort care  Subjective: Patient is seen and examined today morning.  She is very confused unable to provide any history.  Physical Exam: Vitals:   11/11/24 0519 11/11/24 0845 11/11/24 0901 11/11/24 0928  BP: (!) 140/76 (!) 157/57  (!) 184/168  Pulse: 68 81  84  Resp: 13 16    Temp: (!) 97.5 F (36.4 C)  (!) 97.1 F (36.2 C)   TempSrc: Axillary  Tympanic   SpO2: 100% 100%      General - Elderly confused Caucasian female, no apparent distress HEENT - PERRLA, EOMI, forehead laceration noted. Lung - Clear, basal rales, rhonchi, no wheezes. Heart - S1, S2 heard, no murmurs, rubs, trace pedal edema. Abdomen - Soft, non tender, bowel sounds good Neuro - Alert, confused, unable to do full neuro exam. Skin - Warm and dry.  Data Reviewed:      Latest Ref Rng & Units 11/11/2024    3:09 AM 11/10/2024    5:53 PM 11/10/2024    5:30 PM  CBC  WBC 4.0 - 10.5 K/uL 6.2   7.6   Hemoglobin 12.0 - 15.0 g/dL 89.1  88.3  88.4   Hematocrit 36.0 - 46.0 % 32.0  34.0  33.0   Platelets 150 - 400 K/uL 143   175       Latest Ref Rng & Units 11/11/2024    3:09 AM 11/10/2024    5:53 PM 11/10/2024    5:30 PM  BMP  Glucose 70 -  99 mg/dL 883  888  890   BUN 8 - 23 mg/dL 20  24  21    Creatinine 0.44 - 1.00 mg/dL 8.57  8.39  8.50   Sodium 135 - 145 mmol/L 139  139  139   Potassium 3.5 - 5.1 mmol/L 3.9  4.4  4.4   Chloride 98 - 111 mmol/L 103  102  101   CO2 22 - 32 mmol/L 23   25   Calcium  8.9 - 10.3 mg/dL 9.7   89.6    CT HEAD WO CONTRAST Result Date: 11/10/2024 EXAM: CT HEAD, FACIAL BONES AND CERVICAL SPINE WITHOUT CONTRAST 11/10/2024 06:19:24 PM TECHNIQUE: CT of the head, facial bones and cervical spine was performed without the administration of intravenous contrast. Multiplanar reformatted images are provided for review. Automated exposure control, iterative reconstruction,  and/or weight based adjustment of the mA/kV was utilized to reduce the radiation dose to as low as reasonably achievable. COMPARISON: CT head 12/31/2023. CLINICAL HISTORY: Head trauma, moderate-severe. FINDINGS: CT HEAD BRAIN AND VENTRICLES: No acute intracranial hemorrhage. No mass effect or midline shift. No extra-axial fluid collection. No evidence of acute infarct. No hydrocephalus. Stable background of moderate chronic small vessel disease. SKULL AND SCALP: No acute skull fracture. Left frontal scalp hematoma. CT FACIAL BONES FACIAL BONES: No acute facial fracture. No mandibular dislocation. No suspicious bone lesion. ORBITS: No acute traumatic injury. SINUSES AND MASTOIDS: Partial opacification of the bilateral mastoid air cells. No acute abnormality of the sinuses. SOFT TISSUES: No acute abnormality. CT CERVICAL SPINE BONES AND ALIGNMENT: No acute fracture or traumatic malalignment. DEGENERATIVE CHANGES: Multilevel degenerative changes of the cervical spine without high grade spinal canal stenosis. SOFT TISSUES: No prevertebral soft tissue swelling. IMPRESSION: 1. No acute intracranial abnormality. 2. Left frontal scalp hematoma. 3. No acute facial fracture. 4. No acute fracture or traumatic malalignment of the cervical spine. Electronically signed by: Ryan Chess MD 11/10/2024 06:28 PM EST RP Workstation: [id number]   CT MAXILLOFACIAL WO CONTRAST Result Date: 11/10/2024 EXAM: CT HEAD, FACIAL BONES AND CERVICAL SPINE WITHOUT CONTRAST 11/10/2024 06:19:24 PM TECHNIQUE: CT of the head, facial bones and cervical spine was performed without the administration of intravenous contrast. Multiplanar reformatted images are provided for review. Automated exposure control, iterative reconstruction, and/or weight based adjustment of the mA/kV was utilized to reduce the radiation dose to as low as reasonably achievable. COMPARISON: CT head 12/31/2023. CLINICAL HISTORY: Head trauma, moderate-severe. FINDINGS: CT HEAD  BRAIN AND VENTRICLES: No acute intracranial hemorrhage. No mass effect or midline shift. No extra-axial fluid collection. No evidence of acute infarct. No hydrocephalus. Stable background of moderate chronic small vessel disease. SKULL AND SCALP: No acute skull fracture. Left frontal scalp hematoma. CT FACIAL BONES FACIAL BONES: No acute facial fracture. No mandibular dislocation. No suspicious bone lesion. ORBITS: No acute traumatic injury. SINUSES AND MASTOIDS: Partial opacification of the bilateral mastoid air cells. No acute abnormality of the sinuses. SOFT TISSUES: No acute abnormality. CT CERVICAL SPINE BONES AND ALIGNMENT: No acute fracture or traumatic malalignment. DEGENERATIVE CHANGES: Multilevel degenerative changes of the cervical spine without high grade spinal canal stenosis. SOFT TISSUES: No prevertebral soft tissue swelling. IMPRESSION: 1. No acute intracranial abnormality. 2. Left frontal scalp hematoma. 3. No acute facial fracture. 4. No acute fracture or traumatic malalignment of the cervical spine. Electronically signed by: Ryan Chess MD 11/10/2024 06:28 PM EST RP Workstation: [id number]   CT CERVICAL SPINE WO CONTRAST Result Date: 11/10/2024 EXAM: CT HEAD, FACIAL BONES AND CERVICAL SPINE WITHOUT CONTRAST  11/10/2024 06:19:24 PM TECHNIQUE: CT of the head, facial bones and cervical spine was performed without the administration of intravenous contrast. Multiplanar reformatted images are provided for review. Automated exposure control, iterative reconstruction, and/or weight based adjustment of the mA/kV was utilized to reduce the radiation dose to as low as reasonably achievable. COMPARISON: CT head 12/31/2023. CLINICAL HISTORY: Head trauma, moderate-severe. FINDINGS: CT HEAD BRAIN AND VENTRICLES: No acute intracranial hemorrhage. No mass effect or midline shift. No extra-axial fluid collection. No evidence of acute infarct. No hydrocephalus. Stable background of moderate chronic small  vessel disease. SKULL AND SCALP: No acute skull fracture. Left frontal scalp hematoma. CT FACIAL BONES FACIAL BONES: No acute facial fracture. No mandibular dislocation. No suspicious bone lesion. ORBITS: No acute traumatic injury. SINUSES AND MASTOIDS: Partial opacification of the bilateral mastoid air cells. No acute abnormality of the sinuses. SOFT TISSUES: No acute abnormality. CT CERVICAL SPINE BONES AND ALIGNMENT: No acute fracture or traumatic malalignment. DEGENERATIVE CHANGES: Multilevel degenerative changes of the cervical spine without high grade spinal canal stenosis. SOFT TISSUES: No prevertebral soft tissue swelling. IMPRESSION: 1. No acute intracranial abnormality. 2. Left frontal scalp hematoma. 3. No acute facial fracture. 4. No acute fracture or traumatic malalignment of the cervical spine. Electronically signed by: Ryan Chess MD 11/10/2024 06:28 PM EST RP Workstation: [id number]   DG Pelvis Portable Result Date: 11/10/2024 CLINICAL DATA:  Trauma unwitnessed fall EXAM: PORTABLE PELVIS 1-2 VIEWS COMPARISON:  03/16/2020 FINDINGS: SI joints are non widened. Pubic symphysis and rami appear grossly intact. Right hip replacement with normal alignment. No definitive fracture. Moderate left hip degenerative change IMPRESSION: No acute osseous abnormality. Right hip replacement with normal alignment. Moderate left hip degenerative change. Electronically Signed   By: Luke Bun M.D.   On: 11/10/2024 18:14   DG Chest Port 1 View Result Date: 11/10/2024 CLINICAL DATA:  Trauma EXAM: PORTABLE CHEST 1 VIEW COMPARISON:  11/05/2021 FINDINGS: Left-sided pacing device as before. Stable cardiomediastinal silhouette with aortic atherosclerosis. No focal opacity, pleural effusion, or pneumothorax. Clips in the right axilla IMPRESSION: No active disease. Electronically Signed   By: Luke Bun M.D.   On: 11/10/2024 18:14    Family Communication: no family at bedside. Palliative discussed with  family.  Disposition: Status is: Observation The patient remains OBS appropriate and will d/c before 2 midnights.  Planned Discharge Destination: Home with home hospice.     Time spent: 43 minutes  Author: Concepcion Riser, MD 11/11/2024 4:25 PM Secure chat 7am to 7pm For on call review [email or web address].    "

## 2024-11-12 DIAGNOSIS — F03B18 Unspecified dementia, moderate, with other behavioral disturbance: Secondary | ICD-10-CM | POA: Diagnosis not present

## 2024-11-12 DIAGNOSIS — S0181XA Laceration without foreign body of other part of head, initial encounter: Secondary | ICD-10-CM | POA: Diagnosis not present

## 2024-11-12 DIAGNOSIS — Z950 Presence of cardiac pacemaker: Secondary | ICD-10-CM | POA: Diagnosis not present

## 2024-11-12 DIAGNOSIS — Z515 Encounter for palliative care: Secondary | ICD-10-CM | POA: Diagnosis not present

## 2024-11-12 DIAGNOSIS — W19XXXA Unspecified fall, initial encounter: Secondary | ICD-10-CM | POA: Diagnosis not present

## 2024-11-12 DIAGNOSIS — Z7189 Other specified counseling: Secondary | ICD-10-CM | POA: Diagnosis not present

## 2024-11-12 MED ORDER — MORPHINE SULFATE (PF) 2 MG/ML IV SOLN: 1.0000 mg | INTRAVENOUS | Status: DC | PRN

## 2024-11-12 NOTE — Plan of Care (Signed)
 Comfort care measures remain in place. Patient responds to voice and touch. Patient had a bowel movement his shift. Patient was able to answer yes or no questions, and tried to help with turns during changing. Patient left with bed in lowest position.  Problem: Education: Goal: Knowledge of the prescribed therapeutic regimen will improve Outcome: Progressing   Problem: Coping: Goal: Ability to identify and develop effective coping behavior will improve Outcome: Progressing   Problem: Clinical Measurements: Goal: Quality of life will improve Outcome: Progressing   Problem: Role Relationship: Goal: Family's ability to cope with current situation will improve Outcome: Progressing   Problem: Pain Management: Goal: Satisfaction with pain management regimen will improve Outcome: Progressing   Problem: Education: Goal: Knowledge of General Education information will improve Description: Including pain rating scale, medication(s)/side effects and non-pharmacologic comfort measures Outcome: Progressing   Problem: Health Behavior/Discharge Planning: Goal: Ability to manage health-related needs will improve Outcome: Progressing   Problem: Coping: Goal: Level of anxiety will decrease Outcome: Progressing   Problem: Elimination: Goal: Will not experience complications related to bowel motility Outcome: Progressing

## 2024-11-12 NOTE — Discharge Summary (Signed)
 " Physician Discharge Summary   Patient: Danielle [name] MRN: [id number] DOB: 1936-11-19  Admit date:     11/10/2024  Discharge date: 11/12/2024  Discharge Physician: Concepcion Riser   PCP: Norleen Lynwood ORN, MD   Recommendations at discharge:    Hospice facility transfer.  Discharge Diagnoses: Principal Problem:   Fall at home, initial encounter Active Problems:   Hypothyroidism   Hyperlipidemia   Essential hypertension   Cardiac pacemaker in situ   Dementia (HCC)   Abnormal AST and ALT   Elevated CK   Laceration of eyebrow and forehead, initial encounter   Hospice care patient  Resolved Problems:   * No resolved hospital problems. *  Hospital Course: Danielle [name] is a 88 y.o. female with medical history significant for CHB s/p PPM, CKD stage IIIb, HTN, HLD, hypothyroidism, recurrent right breast cancer, dementia who is admitted after fall at home with prolonged downtime.  Patient is admitted for evaluation of fall, head injury, possible UTI. Patient remained confused, agitated while in the emergency department.  Patient required mittens, frequent orientation. Palliative team evaluated the patient, discussed with family who requested hospice care. She is being discharge to inpatient hospice.   Assessment and Plan: S/p unwitnessed fall Presented with unwitnessed fall, prolonged downtime, head injury, CK640. Patient has advanced dementia, palliative team discussed with family who wished for hospice care.  She is started on comfort care end of life orders, continued pain medications, supportive care. Patient is being accepted to inpatient hospice facility.   Recurrent right breast cancer- S/p mastectomy, more recent excision of chest wall mass in May 2025. Per daughter, decision made not to pursue additional treatment.   Complete heart block status post pacemaker- Hypertension Hyperlipidemia Hypothyroidism Will hold off all home medications.    Forehead  laceration status post repair Suture removal in 3 to 5 days.   Abnormal urinalysis  Received 3 days of Rocephin  therapy, abx will be stopped.       Consultants: Palliative Procedures performed: none  Disposition: Hospice care Diet recommendation:  Discharge Diet Orders (From admission, onward)     Start     Ordered   11/12/24 0000  Diet general        11/12/24 1506           Regular diet DISCHARGE MEDICATION: Allergies as of 11/12/2024       Reactions   Seroquel  [quetiapine ] Other (See Comments)   Altered mental state   Codeine Nausea And Vomiting   Oxycodone  Nausea And Vomiting        Medication List     STOP taking these medications    acetaminophen  500 MG tablet Commonly known as: TYLENOL    alendronate  70 MG tablet Commonly known as: FOSAMAX    atenolol  50 MG tablet Commonly known as: TENORMIN    atorvastatin  40 MG tablet Commonly known as: LIPITOR   donepezil  10 MG tablet Commonly known as: ARICEPT    levothyroxine  112 MCG tablet Commonly known as: SYNTHROID    traMADol  50 MG tablet Commonly known as: ULTRAM         Discharge Exam:    11/12/2024    1:32 PM 11/12/2024    8:05 AM 11/11/2024    8:28 PM  Vitals with BMI  Systolic 91 114 116  Diastolic 61 97 88  Pulse 60 59 59    General - Elderly confused Caucasian female, sleeping HEENT - forehead laceration noted.  Lung - Clear, basal rales, rhonchi, no wheezes. Heart - S1, S2 heard, no  murmurs, rubs, trace pedal edema. Abdomen - Soft, non tender, bowel sounds good Neuro - sleeping, unable to do full neuro exam. Skin - Warm and dry.  Condition at discharge: poor  The results of significant diagnostics from this hospitalization (including imaging, microbiology, ancillary and laboratory) are listed below for reference.   Imaging Studies: CT HEAD WO CONTRAST Result Date: 11/10/2024 EXAM: CT HEAD, FACIAL BONES AND CERVICAL SPINE WITHOUT CONTRAST 11/10/2024 06:19:24 PM TECHNIQUE: CT  of the head, facial bones and cervical spine was performed without the administration of intravenous contrast. Multiplanar reformatted images are provided for review. Automated exposure control, iterative reconstruction, and/or weight based adjustment of the mA/kV was utilized to reduce the radiation dose to as low as reasonably achievable. COMPARISON: CT head 12/31/2023. CLINICAL HISTORY: Head trauma, moderate-severe. FINDINGS: CT HEAD BRAIN AND VENTRICLES: No acute intracranial hemorrhage. No mass effect or midline shift. No extra-axial fluid collection. No evidence of acute infarct. No hydrocephalus. Stable background of moderate chronic small vessel disease. SKULL AND SCALP: No acute skull fracture. Left frontal scalp hematoma. CT FACIAL BONES FACIAL BONES: No acute facial fracture. No mandibular dislocation. No suspicious bone lesion. ORBITS: No acute traumatic injury. SINUSES AND MASTOIDS: Partial opacification of the bilateral mastoid air cells. No acute abnormality of the sinuses. SOFT TISSUES: No acute abnormality. CT CERVICAL SPINE BONES AND ALIGNMENT: No acute fracture or traumatic malalignment. DEGENERATIVE CHANGES: Multilevel degenerative changes of the cervical spine without high grade spinal canal stenosis. SOFT TISSUES: No prevertebral soft tissue swelling. IMPRESSION: 1. No acute intracranial abnormality. 2. Left frontal scalp hematoma. 3. No acute facial fracture. 4. No acute fracture or traumatic malalignment of the cervical spine. Electronically signed by: Ryan Chess MD 11/10/2024 06:28 PM EST RP Workstation: [id number]   CT MAXILLOFACIAL WO CONTRAST Result Date: 11/10/2024 EXAM: CT HEAD, FACIAL BONES AND CERVICAL SPINE WITHOUT CONTRAST 11/10/2024 06:19:24 PM TECHNIQUE: CT of the head, facial bones and cervical spine was performed without the administration of intravenous contrast. Multiplanar reformatted images are provided for review. Automated exposure control, iterative  reconstruction, and/or weight based adjustment of the mA/kV was utilized to reduce the radiation dose to as low as reasonably achievable. COMPARISON: CT head 12/31/2023. CLINICAL HISTORY: Head trauma, moderate-severe. FINDINGS: CT HEAD BRAIN AND VENTRICLES: No acute intracranial hemorrhage. No mass effect or midline shift. No extra-axial fluid collection. No evidence of acute infarct. No hydrocephalus. Stable background of moderate chronic small vessel disease. SKULL AND SCALP: No acute skull fracture. Left frontal scalp hematoma. CT FACIAL BONES FACIAL BONES: No acute facial fracture. No mandibular dislocation. No suspicious bone lesion. ORBITS: No acute traumatic injury. SINUSES AND MASTOIDS: Partial opacification of the bilateral mastoid air cells. No acute abnormality of the sinuses. SOFT TISSUES: No acute abnormality. CT CERVICAL SPINE BONES AND ALIGNMENT: No acute fracture or traumatic malalignment. DEGENERATIVE CHANGES: Multilevel degenerative changes of the cervical spine without high grade spinal canal stenosis. SOFT TISSUES: No prevertebral soft tissue swelling. IMPRESSION: 1. No acute intracranial abnormality. 2. Left frontal scalp hematoma. 3. No acute facial fracture. 4. No acute fracture or traumatic malalignment of the cervical spine. Electronically signed by: Ryan Chess MD 11/10/2024 06:28 PM EST RP Workstation: [id number]   CT CERVICAL SPINE WO CONTRAST Result Date: 11/10/2024 EXAM: CT HEAD, FACIAL BONES AND CERVICAL SPINE WITHOUT CONTRAST 11/10/2024 06:19:24 PM TECHNIQUE: CT of the head, facial bones and cervical spine was performed without the administration of intravenous contrast. Multiplanar reformatted images are provided for review. Automated exposure control, iterative reconstruction, and/or weight based  adjustment of the mA/kV was utilized to reduce the radiation dose to as low as reasonably achievable. COMPARISON: CT head 12/31/2023. CLINICAL HISTORY: Head trauma, moderate-severe.  FINDINGS: CT HEAD BRAIN AND VENTRICLES: No acute intracranial hemorrhage. No mass effect or midline shift. No extra-axial fluid collection. No evidence of acute infarct. No hydrocephalus. Stable background of moderate chronic small vessel disease. SKULL AND SCALP: No acute skull fracture. Left frontal scalp hematoma. CT FACIAL BONES FACIAL BONES: No acute facial fracture. No mandibular dislocation. No suspicious bone lesion. ORBITS: No acute traumatic injury. SINUSES AND MASTOIDS: Partial opacification of the bilateral mastoid air cells. No acute abnormality of the sinuses. SOFT TISSUES: No acute abnormality. CT CERVICAL SPINE BONES AND ALIGNMENT: No acute fracture or traumatic malalignment. DEGENERATIVE CHANGES: Multilevel degenerative changes of the cervical spine without high grade spinal canal stenosis. SOFT TISSUES: No prevertebral soft tissue swelling. IMPRESSION: 1. No acute intracranial abnormality. 2. Left frontal scalp hematoma. 3. No acute facial fracture. 4. No acute fracture or traumatic malalignment of the cervical spine. Electronically signed by: Ryan Chess MD 11/10/2024 06:28 PM EST RP Workstation: [id number]   DG Pelvis Portable Result Date: 11/10/2024 CLINICAL DATA:  Trauma unwitnessed fall EXAM: PORTABLE PELVIS 1-2 VIEWS COMPARISON:  03/16/2020 FINDINGS: SI joints are non widened. Pubic symphysis and rami appear grossly intact. Right hip replacement with normal alignment. No definitive fracture. Moderate left hip degenerative change IMPRESSION: No acute osseous abnormality. Right hip replacement with normal alignment. Moderate left hip degenerative change. Electronically Signed   By: Luke Bun M.D.   On: 11/10/2024 18:14   DG Chest Port 1 View Result Date: 11/10/2024 CLINICAL DATA:  Trauma EXAM: PORTABLE CHEST 1 VIEW COMPARISON:  11/05/2021 FINDINGS: Left-sided pacing device as before. Stable cardiomediastinal silhouette with aortic atherosclerosis. No focal opacity, pleural  effusion, or pneumothorax. Clips in the right axilla IMPRESSION: No active disease. Electronically Signed   By: Luke Bun M.D.   On: 11/10/2024 18:14    Microbiology: Results for orders placed or performed during the hospital encounter of 11/10/24  Urine Culture     Status: Abnormal (Preliminary result)   Collection Time: 11/10/24  9:01 PM   Specimen: Urine, Random  Result Value Ref Range Status   Specimen Description URINE, RANDOM  Final   Special Requests NONE Reflexed from [id number]  Final   Culture (A)  Final    >=100,000 COLONIES/mL ESCHERICHIA COLI SUSCEPTIBILITIES TO FOLLOW Performed at [facility], [address] [place], [place] [address]    Report Status PENDING  Incomplete    Labs: CBC: Recent Labs  Lab 11/10/24 1730 11/10/24 1753 11/11/24 0309  WBC 7.6  --  6.2  HGB 11.5* 11.6* 10.8*  HCT 33.0* 34.0* 32.0*  MCV 95.4  --  97.6  PLT 175  --  143*   Basic Metabolic Panel: Recent Labs  Lab 11/10/24 1730 11/10/24 1753 11/11/24 0309  NA 139 139 139  K 4.4 4.4 3.9  CL 101 102 103  CO2 25  --  23  GLUCOSE 109* 111* 116*  BUN 21 24* 20  CREATININE 1.49* 1.60* 1.42*  CALCIUM  10.3  --  9.7   Liver Function Tests: Recent Labs  Lab 11/10/24 1730 11/11/24 0309  AST 94* 84*  ALT 75* 60*  ALKPHOS 85 80  BILITOT 1.0 0.9  PROT 7.3 6.9  ALBUMIN 4.7 4.2   CBG: No results for input(s): GLUCAP in the last 168 hours.  Discharge time spent: 36 minutes.  Signed: Concepcion Riser,  MD [facility] 11/12/2024 "

## 2024-11-12 NOTE — TOC Transition Note (Signed)
 Transition of Care [facility]) - Discharge Note   Patient Details  Name: Danielle [name] MRN: [id number] Date of Birth: [birth year]  [facility]) CM/SW Contact:  Andrez JULIANNA George, RN Phone Number: 11/12/2024, 3:23 PM   Clinical Narrative:     Daughter has decided on residential hospice with [facility]. [facility] has a bed today. She will transport via [facility]. Daughter in agreement.   Number for report: [phone]   Final next level of care: Hospice Medical Facility Barriers to Discharge: No Barriers Identified   Patient Goals and CMS Choice   CMS Medicare.gov Compare Post Acute Care list provided to:: Patient Represenative (must comment) Choice offered to / list presented to : Adult Children      Discharge Placement                       Discharge Plan and Services Additional resources added to the After Visit Summary for                                       Social Drivers of Health (SDOH) Interventions SDOH Screenings   Food Insecurity: No Food Insecurity (11/11/2024)  Housing: Low Risk (11/11/2024)  Transportation Needs: No Transportation Needs (11/11/2024)  Utilities: Not At Risk (11/11/2024)  Alcohol  Screen: Low Risk (03/19/2024)  Depression (PHQ2-9): Low Risk (01/29/2024)  Financial Resource Strain: Low Risk (03/19/2024)  Physical Activity: Insufficiently Active (03/19/2024)  Social Connections: Socially Isolated (11/11/2024)  Stress: Stress Concern Present (03/19/2024)  Tobacco Use: Medium Risk (11/10/2024)     Readmission Risk Interventions     No data to display

## 2024-11-12 NOTE — Progress Notes (Signed)
 " Progress Note   Patient: Danielle [name] FMW:[id number] DOB: 08/25/1936 DOA: 11/10/2024     0 DOS: the patient was seen and examined on 11/12/2024   Brief hospital course: TANEAH MASRI is a 88 y.o. female with medical history significant for CHB s/p PPM, CKD stage IIIb, HTN, HLD, hypothyroidism, recurrent right breast cancer, dementia who is admitted after fall at home with prolonged downtime.  Patient is admitted for evaluation of fall, head injury, possible UTI. Patient remained confused, agitated while in the emergency department.  Patient required mittens, frequent orientation. Palliative team evaluated the patient, discussed with family who requested hospice care at home.  Assessment and Plan: S/p unwitnessed fall Presented with unwitnessed fall, prolonged downtime, head injury, CK6 40. Gentle IV hydration. Repeat CK level in AM. Fall precautions. Patient has advanced dementia, palliative team discussed with family who wished for hospice care at home. Stop her home meds. No frequent vitals, no AM labs. Continue pain medications, supportive care. DME needs to be arranged before discharge. Plan to dc Friday.  Recurrent right breast cancer- S/p mastectomy, more recent excision of chest wall mass in May 2025. Per daughter, decision made not to pursue additional treatment. Home hospice, DME will be arranged.  Complete heart block status post pacemaker- Hypertension Hyperlipidemia Hypothyroidism Will hold off all home medications at this time.  Forehead laceration status post repair Suture removal in 3 to 5 days.  Abnormal urinalysis  Rocephin  therapy until she is discharge home with hospice care.  Disposition- Home hospice with DME.      Out of bed to chair. Incentive spirometry. Nursing supportive care. Fall, aspiration precautions. Diet:  Diet Orders (From admission, onward)     Start     Ordered   11/10/24 2329  Diet regular Fluid consistency: Thin   Diet effective now       Question:  Fluid consistency:  Answer:  Thin   11/10/24 2328           DVT prophylaxis: SCDs Start: 11/10/24 2331  Level of care: Telemetry   Code Status: Do not attempt resuscitation (DNR) - Comfort care  Subjective: Patient is seen and examined today morning.  She is sleeping comfortably. No overnight issues.  Physical Exam: Vitals:   11/11/24 0928 11/11/24 2028 11/12/24 0805 11/12/24 1332  BP: (!) 184/168 116/88 (!) 114/97 91/61  Pulse: 84 (!) 59 (!) 59 60  Resp:  16    Temp:  97.7 F (36.5 C) (!) 97.4 F (36.3 C)   TempSrc:  Oral    SpO2:  91% (!) 87% 90%    General - Elderly confused Caucasian female, sleeping HEENT - PERRLA, EOMI, forehead laceration noted. Lung - Clear, basal rales, rhonchi, no wheezes. Heart - S1, S2 heard, no murmurs, rubs, trace pedal edema. Abdomen - Soft, non tender, bowel sounds good Neuro - sleeping, unable to do full neuro exam. Skin - Warm and dry.  Data Reviewed:      Latest Ref Rng & Units 11/11/2024    3:09 AM 11/10/2024    5:53 PM 11/10/2024    5:30 PM  CBC  WBC 4.0 - 10.5 K/uL 6.2   7.6   Hemoglobin 12.0 - 15.0 g/dL 89.1  88.3  88.4   Hematocrit 36.0 - 46.0 % 32.0  34.0  33.0   Platelets 150 - 400 K/uL 143   175       Latest Ref Rng & Units 11/11/2024    3:09 AM 11/10/2024  5:53 PM 11/10/2024    5:30 PM  BMP  Glucose 70 - 99 mg/dL 883  888  890   BUN 8 - 23 mg/dL 20  24  21    Creatinine 0.44 - 1.00 mg/dL 8.57  8.39  8.50   Sodium 135 - 145 mmol/L 139  139  139   Potassium 3.5 - 5.1 mmol/L 3.9  4.4  4.4   Chloride 98 - 111 mmol/L 103  102  101   CO2 22 - 32 mmol/L 23   25   Calcium  8.9 - 10.3 mg/dL 9.7   89.6    CT HEAD WO CONTRAST Result Date: 11/10/2024 EXAM: CT HEAD, FACIAL BONES AND CERVICAL SPINE WITHOUT CONTRAST 11/10/2024 06:19:24 PM TECHNIQUE: CT of the head, facial bones and cervical spine was performed without the administration of intravenous contrast. Multiplanar reformatted  images are provided for review. Automated exposure control, iterative reconstruction, and/or weight based adjustment of the mA/kV was utilized to reduce the radiation dose to as low as reasonably achievable. COMPARISON: CT head 12/31/2023. CLINICAL HISTORY: Head trauma, moderate-severe. FINDINGS: CT HEAD BRAIN AND VENTRICLES: No acute intracranial hemorrhage. No mass effect or midline shift. No extra-axial fluid collection. No evidence of acute infarct. No hydrocephalus. Stable background of moderate chronic small vessel disease. SKULL AND SCALP: No acute skull fracture. Left frontal scalp hematoma. CT FACIAL BONES FACIAL BONES: No acute facial fracture. No mandibular dislocation. No suspicious bone lesion. ORBITS: No acute traumatic injury. SINUSES AND MASTOIDS: Partial opacification of the bilateral mastoid air cells. No acute abnormality of the sinuses. SOFT TISSUES: No acute abnormality. CT CERVICAL SPINE BONES AND ALIGNMENT: No acute fracture or traumatic malalignment. DEGENERATIVE CHANGES: Multilevel degenerative changes of the cervical spine without high grade spinal canal stenosis. SOFT TISSUES: No prevertebral soft tissue swelling. IMPRESSION: 1. No acute intracranial abnormality. 2. Left frontal scalp hematoma. 3. No acute facial fracture. 4. No acute fracture or traumatic malalignment of the cervical spine. Electronically signed by: Ryan Chess MD 11/10/2024 06:28 PM EST RP Workstation: [id number]   CT MAXILLOFACIAL WO CONTRAST Result Date: 11/10/2024 EXAM: CT HEAD, FACIAL BONES AND CERVICAL SPINE WITHOUT CONTRAST 11/10/2024 06:19:24 PM TECHNIQUE: CT of the head, facial bones and cervical spine was performed without the administration of intravenous contrast. Multiplanar reformatted images are provided for review. Automated exposure control, iterative reconstruction, and/or weight based adjustment of the mA/kV was utilized to reduce the radiation dose to as low as reasonably achievable. COMPARISON:  CT head 12/31/2023. CLINICAL HISTORY: Head trauma, moderate-severe. FINDINGS: CT HEAD BRAIN AND VENTRICLES: No acute intracranial hemorrhage. No mass effect or midline shift. No extra-axial fluid collection. No evidence of acute infarct. No hydrocephalus. Stable background of moderate chronic small vessel disease. SKULL AND SCALP: No acute skull fracture. Left frontal scalp hematoma. CT FACIAL BONES FACIAL BONES: No acute facial fracture. No mandibular dislocation. No suspicious bone lesion. ORBITS: No acute traumatic injury. SINUSES AND MASTOIDS: Partial opacification of the bilateral mastoid air cells. No acute abnormality of the sinuses. SOFT TISSUES: No acute abnormality. CT CERVICAL SPINE BONES AND ALIGNMENT: No acute fracture or traumatic malalignment. DEGENERATIVE CHANGES: Multilevel degenerative changes of the cervical spine without high grade spinal canal stenosis. SOFT TISSUES: No prevertebral soft tissue swelling. IMPRESSION: 1. No acute intracranial abnormality. 2. Left frontal scalp hematoma. 3. No acute facial fracture. 4. No acute fracture or traumatic malalignment of the cervical spine. Electronically signed by: Ryan Chess MD 11/10/2024 06:28 PM EST RP Workstation: [id number]   CT CERVICAL SPINE WO  CONTRAST Result Date: 11/10/2024 EXAM: CT HEAD, FACIAL BONES AND CERVICAL SPINE WITHOUT CONTRAST 11/10/2024 06:19:24 PM TECHNIQUE: CT of the head, facial bones and cervical spine was performed without the administration of intravenous contrast. Multiplanar reformatted images are provided for review. Automated exposure control, iterative reconstruction, and/or weight based adjustment of the mA/kV was utilized to reduce the radiation dose to as low as reasonably achievable. COMPARISON: CT head 12/31/2023. CLINICAL HISTORY: Head trauma, moderate-severe. FINDINGS: CT HEAD BRAIN AND VENTRICLES: No acute intracranial hemorrhage. No mass effect or midline shift. No extra-axial fluid collection. No  evidence of acute infarct. No hydrocephalus. Stable background of moderate chronic small vessel disease. SKULL AND SCALP: No acute skull fracture. Left frontal scalp hematoma. CT FACIAL BONES FACIAL BONES: No acute facial fracture. No mandibular dislocation. No suspicious bone lesion. ORBITS: No acute traumatic injury. SINUSES AND MASTOIDS: Partial opacification of the bilateral mastoid air cells. No acute abnormality of the sinuses. SOFT TISSUES: No acute abnormality. CT CERVICAL SPINE BONES AND ALIGNMENT: No acute fracture or traumatic malalignment. DEGENERATIVE CHANGES: Multilevel degenerative changes of the cervical spine without high grade spinal canal stenosis. SOFT TISSUES: No prevertebral soft tissue swelling. IMPRESSION: 1. No acute intracranial abnormality. 2. Left frontal scalp hematoma. 3. No acute facial fracture. 4. No acute fracture or traumatic malalignment of the cervical spine. Electronically signed by: Ryan Chess MD 11/10/2024 06:28 PM EST RP Workstation: [id number]   DG Pelvis Portable Result Date: 11/10/2024 CLINICAL DATA:  Trauma unwitnessed fall EXAM: PORTABLE PELVIS 1-2 VIEWS COMPARISON:  03/16/2020 FINDINGS: SI joints are non widened. Pubic symphysis and rami appear grossly intact. Right hip replacement with normal alignment. No definitive fracture. Moderate left hip degenerative change IMPRESSION: No acute osseous abnormality. Right hip replacement with normal alignment. Moderate left hip degenerative change. Electronically Signed   By: Luke Bun M.D.   On: 11/10/2024 18:14   DG Chest Port 1 View Result Date: 11/10/2024 CLINICAL DATA:  Trauma EXAM: PORTABLE CHEST 1 VIEW COMPARISON:  11/05/2021 FINDINGS: Left-sided pacing device as before. Stable cardiomediastinal silhouette with aortic atherosclerosis. No focal opacity, pleural effusion, or pneumothorax. Clips in the right axilla IMPRESSION: No active disease. Electronically Signed   By: Luke Bun M.D.   On: 11/10/2024  18:14    Family Communication: no family at bedside.  Disposition: Status is: Observation The patient remains OBS appropriate and will d/c before 2 midnights.  Planned Discharge Destination: Home with home hospice, DME arrangements.     Time spent: 42 minutes  Author: Concepcion Riser, MD 11/12/2024 1:41 PM Secure chat 7am to 7pm For on call review [email or web address].    "

## 2024-11-12 NOTE — Progress Notes (Signed)
" ° °  Palliative Medicine Inpatient Follow Up Note HPI: Danielle [name] is a 88 y.o. female with medical history significant for CHB s/p PPM, CKD stage IIIb, HTN, HLD, hypothyroidism, recurrent right breast cancer, dementia who presented to the ED for evaluation after an unwitnessed fall at home with prolonged downtime. Palliative care asked to support additional goals of care conversations.   Today's Discussion 11/12/2024  *Please note that this is a verbal dictation therefore any spelling or grammatical errors are due to the Dragon [employer] One system interpretation.   I reviewed the chart notes including nursing notes from today, progress notes from today. I also reviewed vital signs, nursing flowsheets, medication administrations record, labs, and imaging.    I met with Jakeline this morning. She was resting comfortably and appeared to have no nonverbal signs of active distress.   Have spoken to the [facility] and hospice teams. Plan for delivery of DME on Friday then discharge home on Saturday.  _________________  Addendum:  I have called and spoke to patients daughter, Gustav this afternoon. She and I discussed her mothers condition at this time. She has requested for Shemeka to transition to inpatient hospice care if she is accepted. I shared that this would be reflected to the [facility] team and the hospice Liaison, Cheri.   Gustav does want antibiotics continued until the time of discharge but does realize they will not continue once patient transitions from the hospital.   Gustav was very thankful for the care provided and glad her mother is peaceful at this time.   Questions and concerns addressed/Palliative Support Provided.   Objective Assessment: Vital Signs Vitals:   11/11/24 2028 11/12/24 0805  BP: 116/88 (!) 114/97  Pulse: (!) 59 (!) 59  Resp: 16   Temp: 97.7 F (36.5 C) (!) 97.4 F (36.3 C)  SpO2: 91% (!) 87%    Intake/Output Summary (Last 24 hours) at 11/12/2024  1316 Last data filed at 11/12/2024 0900 Gross per 24 hour  Intake 3 ml  Output 400 ml  Net -397 ml   Gen: Elderly chronically ill-appearing Caucasian female HEENT: Facial bruising to left side of face, dry mucous membranes CV: Regular rate and rhythm PULM: On room air breathing is even and nonlabored ABD: soft/nontender EXT: No edema  Neuro: Aware of self though completely disoriented  SUMMARY OF RECOMMENDATIONS   DNAR/DNI   Comfort measures   Continue antibiotics until the time of discharge    Comfort medications per [facility]   Appreciate TOC helping to coordinate a reasonable discharge plan   [facility]iate [facility] evaluating at this point for inpatient hospice   Ongoing palliative care support during hospitalization ______________________________________________________________________________________ Rosaline Becton [place] Palliative Medicine Team Team Cell Phone: [phone] Please utilize secure chat with additional questions, if there is no response within 30 minutes please call the above phone number  Time Spent: 35 "

## 2024-11-12 NOTE — Progress Notes (Signed)
Report called to Hospice

## 2024-11-13 LAB — URINE CULTURE: Culture: >=100000 — AB

## 2024-11-20 DEATH — deceased

## 2024-12-24 ENCOUNTER — Telehealth: Payer: Self-pay | Admitting: Neurology

## 2024-12-24 ENCOUNTER — Telehealth: Payer: Self-pay | Admitting: Student in an Organized Health Care Education/Training Program

## 2024-12-24 NOTE — Telephone Encounter (Signed)
 Pt passed away 2024-11-20.

## 2024-12-24 NOTE — Telephone Encounter (Signed)
 Called re missed remote

## 2024-12-31 ENCOUNTER — Ambulatory Visit: Admitting: Neurology

## 2025-02-03 ENCOUNTER — Ambulatory Visit: Admitting: Neurology
